# Patient Record
Sex: Male | Born: 1935 | Race: White | Hispanic: No | Smoking: Former smoker
Health system: Southern US, Community
[De-identification: ages and names within clinical notes are randomized; demographics above are authoritative.]

## PROBLEM LIST (undated history)

## (undated) DIAGNOSIS — D649 Anemia, unspecified: Secondary | ICD-10-CM

## (undated) DIAGNOSIS — C61 Malignant neoplasm of prostate: Secondary | ICD-10-CM

## (undated) DIAGNOSIS — Z85828 Personal history of other malignant neoplasm of skin: Secondary | ICD-10-CM

## (undated) DIAGNOSIS — I10 Essential (primary) hypertension: Secondary | ICD-10-CM

## (undated) DIAGNOSIS — I4891 Unspecified atrial fibrillation: Secondary | ICD-10-CM

## (undated) DIAGNOSIS — C801 Malignant (primary) neoplasm, unspecified: Secondary | ICD-10-CM

## (undated) DIAGNOSIS — M1990 Unspecified osteoarthritis, unspecified site: Secondary | ICD-10-CM

## (undated) HISTORY — DX: Unspecified atrial fibrillation: I48.91

## (undated) HISTORY — PX: PROSTATE SURGERY: SHX751

## (undated) HISTORY — DX: Malignant neoplasm of prostate: C61

## (undated) HISTORY — PX: HERNIA REPAIR: SHX51

## (undated) HISTORY — PX: INNER EAR SURGERY: SHX679

## (undated) HISTORY — DX: Essential (primary) hypertension: I10

## (undated) HISTORY — PX: HAND SURGERY: SHX662

## (undated) HISTORY — DX: Hemochromatosis, unspecified: E83.119

## (undated) HISTORY — DX: Anemia, unspecified: D64.9

## (undated) HISTORY — PX: APPENDECTOMY: SHX54

## (undated) HISTORY — PX: SURGERY SCROTAL / TESTICULAR: SUR1316

## (undated) HISTORY — PX: CHOLECYSTECTOMY: SHX55

## (undated) HISTORY — DX: Malignant (primary) neoplasm, unspecified: C80.1

---
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
## 2004-03-29 ENCOUNTER — Ambulatory Visit: Payer: Self-pay | Admitting: Ophthalmology

## 2004-04-03 ENCOUNTER — Ambulatory Visit: Payer: Self-pay | Admitting: Ophthalmology

## 2006-01-29 ENCOUNTER — Emergency Department: Payer: Self-pay | Admitting: Emergency Medicine

## 2006-02-03 ENCOUNTER — Emergency Department: Payer: Self-pay | Admitting: Unknown Physician Specialty

## 2006-03-18 DIAGNOSIS — Z85828 Personal history of other malignant neoplasm of skin: Secondary | ICD-10-CM

## 2006-03-18 HISTORY — DX: Personal history of other malignant neoplasm of skin: Z85.828

## 2011-02-24 ENCOUNTER — Inpatient Hospital Stay: Payer: Self-pay | Admitting: Internal Medicine

## 2011-10-10 ENCOUNTER — Ambulatory Visit: Payer: Self-pay | Admitting: Internal Medicine

## 2011-10-10 LAB — CBC CANCER CENTER
Basophil #: 0.1 x10 3/mm (ref 0.0–0.1)
Eosinophil #: 0.2 x10 3/mm (ref 0.0–0.7)
HCT: 32.7 % — ABNORMAL LOW (ref 40.0–52.0)
Lymphocyte #: 2.4 x10 3/mm (ref 1.0–3.6)
Lymphocyte %: 27 %
MCH: 35.3 pg — ABNORMAL HIGH (ref 26.0–34.0)
MCHC: 35.5 g/dL (ref 32.0–36.0)
MCV: 100 fL (ref 80–100)
Neutrophil #: 5.3 x10 3/mm (ref 1.4–6.5)
Platelet: 389 x10 3/mm (ref 150–440)
RDW: 17.2 % — ABNORMAL HIGH (ref 11.5–14.5)
WBC: 8.9 x10 3/mm (ref 3.8–10.6)

## 2011-10-10 LAB — IRON AND TIBC
Iron Bind.Cap.(Total): 244 ug/dL — ABNORMAL LOW (ref 250–450)
Iron Saturation: 61 %
Iron: 148 ug/dL (ref 65–175)
Unbound Iron-Bind.Cap.: 96 ug/dL

## 2011-10-10 LAB — CREATININE, SERUM
Creatinine: 1.12 mg/dL (ref 0.60–1.30)
EGFR (African American): >60

## 2011-10-10 LAB — FERRITIN: Ferritin (ARMC): 1159 ng/mL — ABNORMAL HIGH (ref 8–388)

## 2011-10-10 LAB — RETICULOCYTES
Absolute Retic Count: 0.0284 10*6/uL — ABNORMAL LOW (ref 0.031–0.129)
Reticulocyte: 0.86 % (ref 0.7–2.5)

## 2011-10-14 LAB — PROT IMMUNOELECTROPHORES(ARMC)

## 2011-10-17 ENCOUNTER — Ambulatory Visit: Payer: Self-pay | Admitting: Internal Medicine

## 2011-10-29 LAB — RETICULOCYTES
Absolute Retic Count: 0.0333 10*6/uL (ref 0.031–0.129)
Reticulocyte: 1.12 % (ref 0.7–2.5)

## 2011-10-29 LAB — IRON AND TIBC
Iron Bind.Cap.(Total): 246 ug/dL — ABNORMAL LOW (ref 250–450)
Unbound Iron-Bind.Cap.: 140 ug/dL

## 2011-10-29 LAB — CANCER CENTER HEMOGLOBIN: HGB: 10.1 g/dL — ABNORMAL LOW (ref 13.0–18.0)

## 2011-11-01 LAB — OCCULT BLOOD X 1 CARD TO LAB, STOOL
Occult Blood, Feces: NEGATIVE
Occult Blood, Feces: NEGATIVE

## 2011-11-17 ENCOUNTER — Ambulatory Visit: Payer: Self-pay | Admitting: Internal Medicine

## 2011-12-04 LAB — CANCER CENTER HEMOGLOBIN: HGB: 10.3 g/dL — ABNORMAL LOW (ref 13.0–18.0)

## 2011-12-17 ENCOUNTER — Ambulatory Visit: Payer: Self-pay | Admitting: Internal Medicine

## 2012-01-17 ENCOUNTER — Ambulatory Visit: Payer: Self-pay | Admitting: Internal Medicine

## 2012-02-16 ENCOUNTER — Ambulatory Visit: Payer: Self-pay | Admitting: Internal Medicine

## 2012-02-26 LAB — FERRITIN: Ferritin (ARMC): 1187 ng/mL — ABNORMAL HIGH (ref 8–388)

## 2012-02-26 LAB — CBC CANCER CENTER
Basophil #: 0.2 x10 3/mm — ABNORMAL HIGH (ref 0.0–0.1)
Basophil %: 2.1 %
Eosinophil #: 0.3 x10 3/mm (ref 0.0–0.7)
Eosinophil %: 4.3 %
HCT: 32.3 % — ABNORMAL LOW (ref 40.0–52.0)
HGB: 11.4 g/dL — ABNORMAL LOW (ref 13.0–18.0)
Lymphocyte #: 2.4 x10 3/mm (ref 1.0–3.6)
Lymphocyte %: 30.5 %
MCH: 34.3 pg — ABNORMAL HIGH (ref 26.0–34.0)
MCHC: 35.3 g/dL (ref 32.0–36.0)
MCV: 97 fL (ref 80–100)
Monocyte #: 1.3 x10 3/mm — ABNORMAL HIGH (ref 0.2–1.0)
Monocyte %: 16.4 %
Neutrophil #: 3.7 x10 3/mm (ref 1.4–6.5)
Neutrophil %: 46.7 %
Platelet: 371 x10 3/mm (ref 150–440)
RBC: 3.33 10*6/uL — ABNORMAL LOW (ref 4.40–5.90)
RDW: 18 % — ABNORMAL HIGH (ref 11.5–14.5)
WBC: 7.9 x10 3/mm (ref 3.8–10.6)

## 2012-03-18 ENCOUNTER — Ambulatory Visit: Payer: Self-pay | Admitting: Internal Medicine

## 2012-04-18 ENCOUNTER — Ambulatory Visit: Payer: Self-pay | Admitting: Internal Medicine

## 2012-04-22 LAB — RETICULOCYTES
Absolute Retic Count: 0.027 10*6/uL — ABNORMAL LOW (ref 0.031–0.129)
Reticulocyte: 0.8 % (ref 0.7–2.5)

## 2012-04-22 LAB — HEPATIC FUNCTION PANEL A (ARMC)
Albumin: 4.3 g/dL (ref 3.4–5.0)
SGOT(AST): 25 U/L (ref 15–37)
Total Protein: 7 g/dL (ref 6.4–8.2)

## 2012-04-22 LAB — CREATININE, SERUM: EGFR (Non-African Amer.): >60

## 2012-05-01 LAB — CBC CANCER CENTER
Basophil #: 0.1 x10 3/mm (ref 0.0–0.1)
Basophil %: 1.8 %
Eosinophil #: 0.2 x10 3/mm (ref 0.0–0.7)
Eosinophil %: 3.3 %
HCT: 32.7 % — ABNORMAL LOW (ref 40.0–52.0)
Lymphocyte #: 1.9 x10 3/mm (ref 1.0–3.6)
MCHC: 35.1 g/dL (ref 32.0–36.0)
MCV: 99 fL (ref 80–100)
Monocyte #: 1 x10 3/mm (ref 0.2–1.0)
Monocyte %: 13 %
Neutrophil #: 4.1 x10 3/mm (ref 1.4–6.5)
RDW: 18.5 % — ABNORMAL HIGH (ref 11.5–14.5)
WBC: 7.3 x10 3/mm (ref 3.8–10.6)

## 2012-05-01 LAB — BILIRUBIN, DIRECT: Bilirubin, Direct: 0.3 mg/dL — ABNORMAL HIGH (ref 0.00–0.20)

## 2012-05-16 ENCOUNTER — Ambulatory Visit: Payer: Self-pay | Admitting: Internal Medicine

## 2012-06-09 LAB — CBC CANCER CENTER
Basophil #: 0.1 x10 3/mm (ref 0.0–0.1)
Basophil %: 1.5 %
Eosinophil #: 0.2 x10 3/mm (ref 0.0–0.7)
HCT: 30.6 % — ABNORMAL LOW (ref 40.0–52.0)
HGB: 10.7 g/dL — ABNORMAL LOW (ref 13.0–18.0)
Lymphocyte %: 32.4 %
MCH: 34.7 pg — ABNORMAL HIGH (ref 26.0–34.0)
MCHC: 34.9 g/dL (ref 32.0–36.0)
MCV: 100 fL (ref 80–100)
Monocyte %: 11.4 %
Neutrophil #: 3.4 x10 3/mm (ref 1.4–6.5)
Platelet: 305 x10 3/mm (ref 150–440)
RBC: 3.08 10*6/uL — ABNORMAL LOW (ref 4.40–5.90)
RDW: 17.8 % — ABNORMAL HIGH (ref 11.5–14.5)

## 2012-06-09 LAB — FERRITIN: Ferritin (ARMC): 1181 ng/mL — ABNORMAL HIGH (ref 8–388)

## 2012-06-16 ENCOUNTER — Ambulatory Visit: Payer: Self-pay | Admitting: Internal Medicine

## 2012-07-16 ENCOUNTER — Ambulatory Visit: Payer: Self-pay | Admitting: Internal Medicine

## 2012-08-07 LAB — CBC CANCER CENTER
Basophil #: 0 x10 3/mm (ref 0.0–0.1)
Eosinophil #: 0.3 x10 3/mm (ref 0.0–0.7)
Lymphocyte #: 2 x10 3/mm (ref 1.0–3.6)
Lymphocyte %: 26.8 %
MCH: 35.4 pg — ABNORMAL HIGH (ref 26.0–34.0)
MCV: 101 fL — ABNORMAL HIGH (ref 80–100)
Monocyte %: 9.6 %
Neutrophil %: 59.7 %
Platelet: 335 x10 3/mm (ref 150–440)
RBC: 3.08 10*6/uL — ABNORMAL LOW (ref 4.40–5.90)
WBC: 7.3 x10 3/mm (ref 3.8–10.6)

## 2012-08-16 ENCOUNTER — Ambulatory Visit: Payer: Self-pay | Admitting: Internal Medicine

## 2012-10-02 ENCOUNTER — Ambulatory Visit: Payer: Self-pay | Admitting: Internal Medicine

## 2012-10-02 LAB — CBC CANCER CENTER
Basophil #: 0.2 x10 3/mm — ABNORMAL HIGH (ref 0.0–0.1)
Eosinophil #: 0.4 x10 3/mm (ref 0.0–0.7)
Eosinophil %: 4.6 %
HCT: 30.3 % — ABNORMAL LOW (ref 40.0–52.0)
HGB: 10.7 g/dL — ABNORMAL LOW (ref 13.0–18.0)
Lymphocyte #: 2.3 x10 3/mm (ref 1.0–3.6)
Lymphocyte %: 29.6 %
Monocyte #: 0.9 x10 3/mm (ref 0.2–1.0)
Monocyte %: 12.1 %
Neutrophil %: 51.7 %
Platelet: 367 x10 3/mm (ref 150–440)
WBC: 7.7 x10 3/mm (ref 3.8–10.6)

## 2012-10-02 LAB — FERRITIN: Ferritin (ARMC): 1245 ng/mL — ABNORMAL HIGH (ref 8–388)

## 2012-10-16 ENCOUNTER — Ambulatory Visit: Payer: Self-pay | Admitting: Internal Medicine

## 2012-11-13 LAB — FERRITIN: Ferritin (ARMC): 1276 ng/mL — ABNORMAL HIGH (ref 8–388)

## 2012-11-13 LAB — CANCER CENTER HEMOGLOBIN: HGB: 11 g/dL — ABNORMAL LOW (ref 13.0–18.0)

## 2012-11-16 ENCOUNTER — Ambulatory Visit: Payer: Self-pay | Admitting: Internal Medicine

## 2012-11-18 LAB — HEMOGLOBIN: HGB: 10.8 g/dL — ABNORMAL LOW (ref 13.0–18.0)

## 2012-11-25 LAB — HEMOGLOBIN: HGB: 11.2 g/dL — ABNORMAL LOW (ref 13.0–18.0)

## 2012-12-16 ENCOUNTER — Ambulatory Visit: Payer: Self-pay | Admitting: Internal Medicine

## 2012-12-21 LAB — CBC CANCER CENTER
Basophil #: 0.1 x10 3/mm (ref 0.0–0.1)
Basophil %: 0.7 %
Lymphocyte #: 2.4 x10 3/mm (ref 1.0–3.6)
Lymphocyte %: 31.4 %
Monocyte #: 0.8 x10 3/mm (ref 0.2–1.0)
Monocyte %: 10.3 %
Neutrophil #: 4.3 x10 3/mm (ref 1.4–6.5)
RDW: 18.9 % — ABNORMAL HIGH (ref 11.5–14.5)

## 2013-01-06 LAB — CANCER CENTER HEMOGLOBIN: HGB: 10.1 g/dL — ABNORMAL LOW (ref 13.0–18.0)

## 2013-01-13 LAB — CANCER CENTER HEMOGLOBIN: HGB: 10.4 g/dL — ABNORMAL LOW (ref 13.0–18.0)

## 2013-01-16 ENCOUNTER — Ambulatory Visit: Payer: Self-pay | Admitting: Internal Medicine

## 2013-02-03 LAB — CANCER CENTER HEMOGLOBIN: HGB: 10.3 g/dL — ABNORMAL LOW (ref 13.0–18.0)

## 2013-02-15 ENCOUNTER — Ambulatory Visit: Payer: Self-pay | Admitting: Internal Medicine

## 2013-03-03 LAB — CANCER CENTER HEMOGLOBIN: HGB: 10 g/dL — ABNORMAL LOW

## 2013-03-15 LAB — CANCER CENTER HEMOGLOBIN: HGB: 9.7 g/dL — ABNORMAL LOW (ref 13.0–18.0)

## 2013-03-18 ENCOUNTER — Ambulatory Visit: Payer: Self-pay | Admitting: Internal Medicine

## 2013-03-31 LAB — CANCER CENTER HEMOGLOBIN: HGB: 10 g/dL — ABNORMAL LOW (ref 13.0–18.0)

## 2013-04-07 LAB — CANCER CENTER HEMOGLOBIN: HGB: 9.8 g/dL — AB (ref 13.0–18.0)

## 2013-04-14 LAB — HEMOGLOBIN: HGB: 9.5 g/dL — ABNORMAL LOW (ref 13.0–18.0)

## 2013-04-18 ENCOUNTER — Ambulatory Visit: Payer: Self-pay | Admitting: Internal Medicine

## 2013-04-21 ENCOUNTER — Ambulatory Visit: Payer: Self-pay | Admitting: Internal Medicine

## 2013-04-21 LAB — CANCER CENTER HEMOGLOBIN: HGB: 9.4 g/dL — ABNORMAL LOW (ref 13.0–18.0)

## 2013-04-28 LAB — CANCER CENTER HEMOGLOBIN: HGB: 9.2 g/dL — ABNORMAL LOW (ref 13.0–18.0)

## 2013-05-05 ENCOUNTER — Inpatient Hospital Stay: Payer: Self-pay | Admitting: Internal Medicine

## 2013-05-05 LAB — TSH: Thyroid Stimulating Horm: 1.86 u[IU]/mL

## 2013-05-05 LAB — CBC WITH DIFFERENTIAL/PLATELET
Basophil #: 0.2 10*3/uL — ABNORMAL HIGH (ref 0.0–0.1)
Basophil %: 1.3 %
Eosinophil #: 0.3 10*3/uL (ref 0.0–0.7)
Eosinophil %: 2.5 %
HCT: 31.7 % — ABNORMAL LOW (ref 40.0–52.0)
HGB: 10.4 g/dL — AB (ref 13.0–18.0)
LYMPHS ABS: 2.1 10*3/uL (ref 1.0–3.6)
Lymphocyte %: 15.5 %
MCH: 34.5 pg — AB (ref 26.0–34.0)
MCHC: 32.9 g/dL (ref 32.0–36.0)
MCV: 105 fL — AB (ref 80–100)
MONOS PCT: 7.6 %
Monocyte #: 1 x10 3/mm (ref 0.2–1.0)
NEUTROS ABS: 9.9 10*3/uL — AB (ref 1.4–6.5)
Neutrophil %: 73.1 %
Platelet: 416 10*3/uL (ref 150–440)
RBC: 3.03 10*6/uL — AB (ref 4.40–5.90)
RDW: 19 % — AB (ref 11.5–14.5)
WBC: 13.6 10*3/uL — ABNORMAL HIGH (ref 3.8–10.6)

## 2013-05-05 LAB — COMPREHENSIVE METABOLIC PANEL WITH GFR
Albumin: 4 g/dL
Alkaline Phosphatase: 70 U/L
Anion Gap: 7
BUN: 34 mg/dL — ABNORMAL HIGH
Bilirubin,Total: 1.5 mg/dL — ABNORMAL HIGH
Calcium, Total: 9.3 mg/dL
Chloride: 108 mmol/L — ABNORMAL HIGH
Co2: 25 mmol/L
Creatinine: 1.28 mg/dL
EGFR (African American): 57 — ABNORMAL LOW
EGFR (Non-African Amer.): 49 — ABNORMAL LOW
Glucose: 98 mg/dL
Osmolality: 287
Potassium: 3.4 mmol/L — ABNORMAL LOW
SGOT(AST): 28 U/L
SGPT (ALT): 21 U/L
Sodium: 140 mmol/L
Total Protein: 7.2 g/dL

## 2013-05-05 LAB — APTT: Activated PTT: 31.5 secs (ref 23.6–35.9)

## 2013-05-05 LAB — TROPONIN I: Troponin-I: <0.02 ng/mL

## 2013-05-05 LAB — URINALYSIS, COMPLETE
Bacteria: NONE SEEN
Bilirubin,UR: NEGATIVE
Blood: NEGATIVE
GLUCOSE, UR: NEGATIVE mg/dL (ref 0–75)
Hyaline Cast: 1
KETONE: NEGATIVE
Leukocyte Esterase: NEGATIVE
NITRITE: NEGATIVE
Ph: 5 (ref 4.5–8.0)
Protein: NEGATIVE
SPECIFIC GRAVITY: 1.014 (ref 1.003–1.030)
WBC UR: 1 /HPF (ref 0–5)

## 2013-05-05 LAB — PROTIME-INR
INR: 1.1
Prothrombin Time: 14.1 secs (ref 11.5–14.7)

## 2013-05-05 LAB — HEMOGLOBIN: HGB: 9.2 g/dL — ABNORMAL LOW (ref 13.0–18.0)

## 2013-05-05 LAB — CANCER CENTER HEMOGLOBIN: HGB: 10 g/dL — ABNORMAL LOW (ref 13.0–18.0)

## 2013-05-05 LAB — MAGNESIUM: Magnesium: 1.7 mg/dL — ABNORMAL LOW

## 2013-05-05 LAB — CK-MB
CK-MB: 1.3 ng/mL (ref 0.5–3.6)
CK-MB: 1.1 ng/mL (ref 0.5–3.6)

## 2013-05-05 LAB — LIPASE, BLOOD: Lipase: 230 U/L

## 2013-05-06 LAB — CBC WITH DIFFERENTIAL/PLATELET
BASOS ABS: 0.1 10*3/uL (ref 0.0–0.1)
Basophil %: 1.7 %
Eosinophil #: 0.3 10*3/uL (ref 0.0–0.7)
Eosinophil %: 4.6 %
HCT: 24.3 % — AB (ref 40.0–52.0)
HGB: 8.8 g/dL — ABNORMAL LOW (ref 13.0–18.0)
LYMPHS ABS: 1.8 10*3/uL (ref 1.0–3.6)
Lymphocyte %: 28.8 %
MCH: 37 pg — ABNORMAL HIGH (ref 26.0–34.0)
MCHC: 36.2 g/dL — AB (ref 32.0–36.0)
MCV: 102 fL — AB (ref 80–100)
Monocyte #: 0.7 x10 3/mm (ref 0.2–1.0)
Monocyte %: 11.1 %
Neutrophil #: 3.3 10*3/uL (ref 1.4–6.5)
Neutrophil %: 53.8 %
Platelet: 312 10*3/uL (ref 150–440)
RBC: 2.37 10*6/uL — ABNORMAL LOW (ref 4.40–5.90)
RDW: 18.1 % — ABNORMAL HIGH (ref 11.5–14.5)
WBC: 6.1 10*3/uL (ref 3.8–10.6)

## 2013-05-06 LAB — COMPREHENSIVE METABOLIC PANEL
ALBUMIN: 3.5 g/dL (ref 3.4–5.0)
Alkaline Phosphatase: 60 U/L
Anion Gap: 6 — ABNORMAL LOW (ref 7–16)
BILIRUBIN TOTAL: 1.4 mg/dL — AB (ref 0.2–1.0)
BUN: 32 mg/dL — ABNORMAL HIGH (ref 7–18)
CO2: 24 mmol/L (ref 21–32)
CREATININE: 1.3 mg/dL (ref 0.60–1.30)
Calcium, Total: 9.1 mg/dL (ref 8.5–10.1)
Chloride: 112 mmol/L — ABNORMAL HIGH (ref 98–107)
EGFR (African American): 56 — ABNORMAL LOW
GFR CALC NON AF AMER: 48 — AB
Glucose: 95 mg/dL (ref 65–99)
OSMOLALITY: 290 (ref 275–301)
Potassium: 3.7 mmol/L (ref 3.5–5.1)
SGOT(AST): 22 U/L (ref 15–37)
SGPT (ALT): 18 U/L (ref 12–78)
Sodium: 142 mmol/L (ref 136–145)
TOTAL PROTEIN: 6.1 g/dL — AB (ref 6.4–8.2)

## 2013-05-06 LAB — TROPONIN I: Troponin-I: <0.02 ng/mL

## 2013-05-06 LAB — CK-MB: CK-MB: 1.3 ng/mL (ref 0.5–3.6)

## 2013-05-16 ENCOUNTER — Ambulatory Visit: Payer: Self-pay | Admitting: Internal Medicine

## 2013-05-19 LAB — CANCER CENTER HEMOGLOBIN: HGB: 9.4 g/dL — AB (ref 13.0–18.0)

## 2013-05-26 LAB — CANCER CENTER HEMOGLOBIN: HGB: 10 g/dL — ABNORMAL LOW (ref 13.0–18.0)

## 2013-05-28 LAB — HEMOGLOBIN: HGB: 9.7 g/dL — ABNORMAL LOW (ref 13.0–18.0)

## 2013-06-04 LAB — CANCER CENTER HEMOGLOBIN: HGB: 10.5 g/dL — ABNORMAL LOW (ref 13.0–18.0)

## 2013-06-11 LAB — CANCER CENTER HEMOGLOBIN: HGB: 10.5 g/dL — ABNORMAL LOW (ref 13.0–18.0)

## 2013-06-16 ENCOUNTER — Ambulatory Visit: Payer: Self-pay | Admitting: Internal Medicine

## 2013-06-18 ENCOUNTER — Ambulatory Visit: Payer: Self-pay | Admitting: Internal Medicine

## 2013-06-18 LAB — CANCER CENTER HEMOGLOBIN: HGB: 10.2 g/dL — ABNORMAL LOW (ref 13.0–18.0)

## 2013-06-23 DIAGNOSIS — M1990 Unspecified osteoarthritis, unspecified site: Secondary | ICD-10-CM | POA: Insufficient documentation

## 2013-06-23 DIAGNOSIS — D649 Anemia, unspecified: Secondary | ICD-10-CM | POA: Insufficient documentation

## 2013-06-23 DIAGNOSIS — I10 Essential (primary) hypertension: Secondary | ICD-10-CM | POA: Insufficient documentation

## 2013-06-24 DIAGNOSIS — I340 Nonrheumatic mitral (valve) insufficiency: Secondary | ICD-10-CM | POA: Insufficient documentation

## 2013-06-24 DIAGNOSIS — I4891 Unspecified atrial fibrillation: Secondary | ICD-10-CM | POA: Insufficient documentation

## 2013-06-25 LAB — CANCER CENTER HEMOGLOBIN: HGB: 10.7 g/dL — AB (ref 13.0–18.0)

## 2013-06-28 ENCOUNTER — Encounter: Payer: Self-pay | Admitting: Neurology

## 2013-07-02 LAB — CANCER CENTER HEMOGLOBIN: HGB: 10.4 g/dL — AB (ref 13.0–18.0)

## 2013-07-12 LAB — CANCER CENTER HEMOGLOBIN: HGB: 10.7 g/dL — ABNORMAL LOW (ref 13.0–18.0)

## 2013-07-16 ENCOUNTER — Ambulatory Visit: Payer: Self-pay | Admitting: Internal Medicine

## 2013-07-16 ENCOUNTER — Encounter: Payer: Self-pay | Admitting: Neurology

## 2013-07-19 LAB — CANCER CENTER HEMOGLOBIN: HGB: 10.9 g/dL — AB (ref 13.0–18.0)

## 2013-07-28 LAB — CANCER CENTER HEMOGLOBIN: HGB: 10.1 g/dL — ABNORMAL LOW (ref 13.0–18.0)

## 2013-08-11 LAB — CANCER CENTER HEMOGLOBIN: HGB: 10.9 g/dL — ABNORMAL LOW (ref 13.0–18.0)

## 2013-08-16 ENCOUNTER — Ambulatory Visit: Payer: Self-pay | Admitting: Internal Medicine

## 2013-08-18 LAB — CANCER CENTER HEMOGLOBIN: HGB: 11 g/dL — AB (ref 13.0–18.0)

## 2013-09-01 LAB — CANCER CENTER HEMOGLOBIN: HGB: 10.8 g/dL — AB (ref 13.0–18.0)

## 2013-09-14 DIAGNOSIS — R270 Ataxia, unspecified: Secondary | ICD-10-CM | POA: Insufficient documentation

## 2013-09-14 DIAGNOSIS — R262 Difficulty in walking, not elsewhere classified: Secondary | ICD-10-CM | POA: Insufficient documentation

## 2013-09-14 DIAGNOSIS — R42 Dizziness and giddiness: Secondary | ICD-10-CM | POA: Insufficient documentation

## 2013-09-15 ENCOUNTER — Ambulatory Visit: Payer: Self-pay | Admitting: Internal Medicine

## 2013-09-15 LAB — CANCER CENTER HEMOGLOBIN: HGB: 10.2 g/dL — ABNORMAL LOW (ref 13.0–18.0)

## 2013-09-29 LAB — CANCER CENTER HEMOGLOBIN: HGB: 9.9 g/dL — AB (ref 13.0–18.0)

## 2013-10-13 LAB — CANCER CENTER HEMOGLOBIN: HGB: 10.4 g/dL — AB (ref 13.0–18.0)

## 2013-10-16 ENCOUNTER — Ambulatory Visit: Payer: Self-pay | Admitting: Internal Medicine

## 2013-10-28 LAB — CANCER CENTER HEMOGLOBIN: HGB: 10.1 g/dL — ABNORMAL LOW (ref 13.0–18.0)

## 2013-11-11 LAB — CANCER CENTER HEMOGLOBIN: HGB: 9.3 g/dL — AB (ref 13.0–18.0)

## 2013-11-16 ENCOUNTER — Ambulatory Visit: Payer: Self-pay | Admitting: Internal Medicine

## 2013-11-25 LAB — CREATININE, SERUM
Creatinine: 1.22 mg/dL (ref 0.60–1.30)
EGFR (Non-African Amer.): 52 — ABNORMAL LOW

## 2013-11-25 LAB — CBC CANCER CENTER
BASOS PCT: 1.6 %
Basophil #: 0.1 x10 3/mm (ref 0.0–0.1)
EOS PCT: 2.3 %
Eosinophil #: 0.2 x10 3/mm (ref 0.0–0.7)
HCT: 31.5 % — AB (ref 40.0–52.0)
HGB: 10.4 g/dL — ABNORMAL LOW (ref 13.0–18.0)
LYMPHS ABS: 2 x10 3/mm (ref 1.0–3.6)
Lymphocyte %: 24.6 %
MCH: 33 pg (ref 26.0–34.0)
MCHC: 33 g/dL (ref 32.0–36.0)
MCV: 100 fL (ref 80–100)
Monocyte #: 0.8 x10 3/mm (ref 0.2–1.0)
Monocyte %: 10.7 %
NEUTROS ABS: 4.8 x10 3/mm (ref 1.4–6.5)
Neutrophil %: 60.8 %
Platelet: 367 x10 3/mm (ref 150–440)
RBC: 3.15 10*6/uL — ABNORMAL LOW (ref 4.40–5.90)
RDW: 22.5 % — ABNORMAL HIGH (ref 11.5–14.5)
WBC: 8 x10 3/mm (ref 3.8–10.6)

## 2013-12-02 LAB — CBC CANCER CENTER
Basophil #: 0.1 x10 3/mm (ref 0.0–0.1)
Basophil %: 1.4 %
Eosinophil #: 0.3 x10 3/mm (ref 0.0–0.7)
Eosinophil %: 3.8 %
HCT: 29.5 % — ABNORMAL LOW (ref 40.0–52.0)
HGB: 9.8 g/dL — AB (ref 13.0–18.0)
LYMPHS ABS: 2.1 x10 3/mm (ref 1.0–3.6)
Lymphocyte %: 28.7 %
MCH: 33.5 pg (ref 26.0–34.0)
MCHC: 33.3 g/dL (ref 32.0–36.0)
MCV: 101 fL — AB (ref 80–100)
MONO ABS: 0.8 x10 3/mm (ref 0.2–1.0)
MONOS PCT: 10.4 %
Neutrophil #: 4.1 x10 3/mm (ref 1.4–6.5)
Neutrophil %: 55.7 %
PLATELETS: 370 x10 3/mm (ref 150–440)
RBC: 2.93 10*6/uL — AB (ref 4.40–5.90)
RDW: 21.9 % — ABNORMAL HIGH (ref 11.5–14.5)
WBC: 7.3 x10 3/mm (ref 3.8–10.6)

## 2013-12-16 ENCOUNTER — Ambulatory Visit: Payer: Self-pay | Admitting: Internal Medicine

## 2013-12-16 LAB — CBC CANCER CENTER
BASOS ABS: 0 x10 3/mm (ref 0.0–0.1)
BASOS PCT: 0.7 %
Eosinophil #: 0.3 x10 3/mm (ref 0.0–0.7)
Eosinophil %: 4.2 %
HCT: 30 % — AB (ref 40.0–52.0)
HGB: 9.9 g/dL — AB (ref 13.0–18.0)
LYMPHS ABS: 1.5 x10 3/mm (ref 1.0–3.6)
LYMPHS PCT: 24.5 %
MCH: 33.5 pg (ref 26.0–34.0)
MCHC: 33.1 g/dL (ref 32.0–36.0)
MCV: 101 fL — ABNORMAL HIGH (ref 80–100)
Monocyte #: 0.6 x10 3/mm (ref 0.2–1.0)
Monocyte %: 9.3 %
NEUTROS ABS: 3.8 x10 3/mm (ref 1.4–6.5)
NEUTROS PCT: 61.3 %
Platelet: 308 x10 3/mm (ref 150–440)
RBC: 2.96 10*6/uL — ABNORMAL LOW (ref 4.40–5.90)
RDW: 22.2 % — ABNORMAL HIGH (ref 11.5–14.5)
WBC: 6.3 x10 3/mm (ref 3.8–10.6)

## 2013-12-27 LAB — CANCER CENTER HEMOGLOBIN: HGB: 10.2 g/dL — AB (ref 13.0–18.0)

## 2014-01-13 LAB — CANCER CENTER HEMOGLOBIN: HGB: 10 g/dL — ABNORMAL LOW (ref 13.0–18.0)

## 2014-01-16 ENCOUNTER — Ambulatory Visit: Payer: Self-pay | Admitting: Internal Medicine

## 2014-01-27 LAB — CANCER CENTER HEMOGLOBIN: HGB: 10.1 g/dL — ABNORMAL LOW (ref 13.0–18.0)

## 2014-02-09 LAB — CANCER CENTER HEMOGLOBIN: HGB: 10.5 g/dL — ABNORMAL LOW (ref 13.0–18.0)

## 2014-02-15 ENCOUNTER — Ambulatory Visit: Payer: Self-pay | Admitting: Internal Medicine

## 2014-02-24 LAB — CANCER CENTER HEMOGLOBIN: HGB: 9.4 g/dL — ABNORMAL LOW (ref 13.0–18.0)

## 2014-03-08 LAB — CANCER CENTER HEMOGLOBIN: HGB: 10.9 g/dL — AB (ref 13.0–18.0)

## 2014-03-18 ENCOUNTER — Ambulatory Visit: Payer: Self-pay | Admitting: Internal Medicine

## 2014-03-24 LAB — CANCER CENTER HEMOGLOBIN: HGB: 9.8 g/dL — ABNORMAL LOW (ref 13.0–18.0)

## 2014-04-07 LAB — CANCER CENTER HEMOGLOBIN: HGB: 9.7 g/dL — ABNORMAL LOW (ref 13.0–18.0)

## 2014-04-18 ENCOUNTER — Ambulatory Visit: Payer: Self-pay | Admitting: Internal Medicine

## 2014-04-21 LAB — CANCER CENTER HEMOGLOBIN: HGB: 9.9 g/dL — ABNORMAL LOW (ref 13.0–18.0)

## 2014-05-17 ENCOUNTER — Ambulatory Visit
Admit: 2014-05-17 | Disposition: A | Discharge status: Home / Self Care | Payer: Self-pay | Attending: Internal Medicine | Admitting: Internal Medicine

## 2014-06-16 LAB — CANCER CENTER HEMOGLOBIN: HGB: 10.5 g/dL — AB (ref 13.0–18.0)

## 2014-06-17 ENCOUNTER — Ambulatory Visit
Admit: 2014-06-17 | Disposition: A | Discharge status: Home / Self Care | Payer: Self-pay | Attending: Internal Medicine | Admitting: Internal Medicine

## 2014-06-30 LAB — CANCER CENTER HEMOGLOBIN: HGB: 9.7 g/dL — ABNORMAL LOW (ref 13.0–18.0)

## 2014-07-09 NOTE — Consult Note (Signed)
PATIENT NAME:  Gerald [name], Gerald [name] MR#:  [id number] DATE OF BIRTH:  1920-04-04  DATE OF CONSULTATION:  05/06/2013  REFERRING PHYSICIAN:  Dr. Kary Kos, Dr. Inez Pilgrim.  CONSULTING PHYSICIAN:  Bertil Brickey D. Apollos Tenbrink, MD  CARDIOLOGIST: Dr. Saralyn Pilar   INDICATION: Atrial fibrillation, palpitations, tachycardia.   HISTORY OF PRESENT ILLNESS: The patient is a 81 year old white male with a history of anemia of chronic disease on Procrit, hemochromatosis, history of multiple other medical problems including paroxysmal Afib, came to the hospital with palpitations. Had seen Dr. Inez Pilgrim as part of his evaluation for anemia. Was found to have elevated heart rate. Went to the Emergency Room on Dr. [name] request and was found to have a heart rate between 40 and 140, blood pressure of 90. The patient states that he was reasonably asymptomatic. He was given calcium and magnesium and IV fluids because his blood pressure was low. Heart rate stabilized in the 70s.  He converted back to sinus rhythm from Afib. He has had some diarrhea over the last few days. Denied any pain. He had some cramping, however, that also improved.   PAST MEDICAL HISTORY: Again, notable for anemia, hypertension, atrial fibrillation, history of pneumonia in the past, hyperlipidemia, arthritis, hemochromatosis,  generalized weakness.   PAST SURGICAL HISTORY:  Cataract surgery, testicular carcinoma, prostate carcinoma status post surgery, hernia, gallbladder.   MEDICATIONS: Lisinopril 20 mg a day, aspirin 81 mg a day.   ALLERGIES: None.   FAMILY HISTORY: Lung cancer, smoker; leukemia, colon cancer, breast cancer.   SOCIAL HISTORY: Married, lives with his wife, has 7 children, 16 grandchildren, 2 Designer, industrial/product. Used to smoke, quit 50 years ago. No alcohol consumption. Used to work as an Chief Financial Officer, originally from [place].   REVIEW OF SYSTEMS:  No blackout spells. No syncope. No nausea or vomiting. No fever. No chills. No sweats. No weight  loss. No weight gain. No hemoptysis or hematemesis. No bright red blood per rectum. He has had palpitations, tachycardia, low blood pressure, weakness.   PHYSICAL EXAMINATION:  VITAL SIGNS:  Initially revealed blood pressure was 90, increased to 419 systolic, 70 diastolic. Pulse initially was 149, now is down to 75 and regular. Respiratory is 18.  HEENT: Normocephalic, atraumatic. Pupils are equal and reactive to light.  NECK:  Supple. No significant JVD, bruits or adenopathy.  LUNGS: Clear to auscultation and percussion. No significant wheeze, rhonchi or rale.  HEART: Regular rate and rhythm.  ABDOMEN: Benign.  EXTREMITIES: Within normal limits.   NEUROLOGIC: Intact.  SKIN: Normal.   EKG: Rate 72. Initial EKG: Atrial fibrillation, rapid ventricular response, nonspecific ST-T changes.   LABORATORY DATA:  BUN 34, potassium 3.4. Magnesium level was low at 1.7. Bilirubin . Troponin less than 0.2. Hemoglobin 10.  Coag was normal.  UA was normal. D-dimer pending.   ASSESSMENT:  Atrial fibrillation, palpitations, hypertension, hypokalemia, anemia, diarrhea.   PLAN:  Agree with admit. Place on telemetry. Recommend fluid resuscitation. Recommend correct electrolytes. No direct therapy for atrial fibrillation. He has converted back to sinus. Do not recommend anticoagulation because the patient has a long history of anemia. Hold off on hypertensive drugs because of his relative hypotension secondary to dehydration and diarrhea. Replace potassium and magnesium. Follow up anemia. Continue current therapy. Treat the patient conservatively. Have the patient follow up with Dr. Saralyn Pilar. Okay to discharge. Treat the patient medically in the interim.   ____________________________ Loran Senters Clayborn Bigness, MD ddc:dmm D: 05/06/2013 15:14:05 ET T: 05/06/2013 19:01:13 ET JOB#: [id number]  cc: Talise Sligh D. Clydene Burack,  MD, <Dictator> Yolonda Kida MD ELECTRONICALLY SIGNED 06/14/2013 7:00

## 2014-07-09 NOTE — Discharge Summary (Signed)
PATIENT NAME:  Gerald, [name] MR#:  [id number] DATE OF BIRTH:  11-25-1920  DATE OF ADMISSION:  05/05/2013 DATE OF DISCHARGE:  05/06/2013  DISCHARGE DIAGNOSES: 1.  Paroxysmal atrial fibrillation.  2.  Dehydration.  3.  Chronic dizziness.  4.  Hemochromatosis with anemia.  DISCHARGE MEDICATIONS: 1.  Aspirin 81 mg daily.  2.  Lisinopril 20 mg 1/2 tablet once a day at bedtime.   ADMITTING HISTORY AND PHYSICAL AND HOSPITAL COURSE:  Please see detailed H and P dictated previously. In brief, a 81 year old male patient with history of hemochromatosis anemia on Procrit shot was seen by Dr. Inez Pilgrim in his office. He was found to have tachycardia in the 140s, brought to the Emergency Room, later admitted to the hospitalist service secondary to afib with rapid ventricular rate. The patient received magnesium potassium. He did convert to normal sinus rhythm. The patient was monitored on tele floor. He did not have any further episodes of atrial fibrillation. No bradycardia. The patient did have episodes of bradycardia in the past for which he is not on any rate control medications. He was seen by Dr. Clayborn Bigness. Echo was done which showed moderate MR, normal wall motion. The patient feels better back to baseline. He does have chronic dizziness for which he is waiting on an outpatient neurology consult, which he will follow up with. The patient worked with physical therapy and is being discharged back home on the same medications. We are decreasing his lisinopril dose.   DISCHARGE INSTRUCTIONS: Low-sodium diet. Activity as tolerated. Follow up with Dr. Inez Pilgrim in urology in 1 to 2 weeks.  TIME SPENT ON DAY OF DISCHARGE IN DISCHARGE ACTIVITY:  40 minutes   ____________________________ Leia Alf. Ovid Witman, MD srs:ce D: 05/07/2013 16:16:20 ET T: 05/07/2013 17:24:02 ET JOB#: [id number]  cc: Alveta Heimlich R. Diangelo Radel, MD, <Dictator> Simonne Come. Inez Pilgrim, MD Isaias Cowman, MD Irven Easterly. Kary Kos, MD Neita Carp  MD ELECTRONICALLY SIGNED 05/20/2013 18:41

## 2014-07-09 NOTE — H&P (Signed)
PATIENT NAME:  Gerald [name], Gerald [name] MR#:  [id number] DATE OF BIRTH:  12-22-1920  DATE OF ADMISSION:  05/05/2013  PRIMARY CARE PHYSICIAN: Dr. Kary Kos.  CARDIOLOGIST: Dr. Saralyn Pilar.  ONCOLOGIST: Dr. Inez Pilgrim.   HOSPITAL COURSE: The patient is a 81 year old Caucasian male with past medical history significant for history of anemia of chronic disease being on Procrit, also history of hemochromatosis, history of multiple other medical problems including paroxysmal atrial fibrillation, who comes to the hospital with complaints of palpitations.   Apparently the patient was seen by Dr. Inez Pilgrim earlier today and was noted to have elevated heart rate. He was sent to the Emergency Room for further evaluation where he was found to be in AFib with variable rate with heart rate fluctuating between 40 to 140. He was also hypotensive with systolic blood pressure in 90s.   He was given calcium 2 g as well as 2 g of magnesium IV as well as IV fluids after which his blood pressure somewhat stabilized and his heart rate improved to 70s, and he also converted into sinus rhythm again.   He was noted to have premature ventricular complexes with sinus rhythm. He denies any chest pains or denies feeling even palpitations.   He has been complaining of diarrhea over the past few days. He did have diarrheal stool on Saturday a few days ago and he had diarrhea earlier today in the morning. He denies any significant abdominal pains, however, told me that he had some cramping in left lower quadrant earlier today.   PAST MEDICAL HISTORY: Significant for history of anemia of chronic disease, history of initiation on Procrit with goal hemoglobin level of below 12 according to Dr. Inez Pilgrim, history of prostate carcinoma, testicular carcinoma status post surgery, hypertension, cataract surgery bilaterally, atrial fibrillation, history of pneumonia, hypertension, hyperlipidemia, arthritis, hemochromatosis as well as generalized weakness.    MEDICATIONS: The patient is on lisinopril at 20 mg p.o. daily and aspirin 81 mg p.o. daily.   PAST SURGICAL HISTORY: Hernia surgery, gallbladder surgery, as well as testicular and prostate carcinoma operations.   ALLERGIES: No known drug allergies.   FAMILY HISTORY: The patient's father died of lung carcinoma. He was a smoker. The patient's mother had leukemia. The patient's brother had colon cancer. Sister had both breast cancer.   SOCIAL HISTORY: The patient is married. He lives with his wife. He has 7 children, 16 grandchildren, and 2 great grandchildren. He used to smoke for 25 years, quit approximately 50 years ago. Denies alcohol abuse and drinks occasionally only socially. He used to work as an Chief Financial Officer.   REVIEW OF SYSTEMS: Positive for bilateral cataracts and also diarrhea over the past few days intermittently, some left lower quadrant abdominal pain with diarrheal stool, balance problems for the past one year. Also left leg discomfort earlier today. Some incontinence due to prostate carcinoma.  CONSTITUTIONAL: Otherwise, denies any fevers, chills, fatigue, weakness, pains, weight loss or gain.  EYES: Denies any blurry vision, double vision or glaucoma.  ENT: Denies any tinnitus, allergies, epistaxis, sinus pain, difficulty swallowing.  RESPIRATORY: Denies cough, wheezes, asthma, COPD. CARDIOVASCULAR:  Denies chest pain, orthopnea, arrhythmias, palpitations. GASTROINTESTINAL: Denies nausea, vomiting, rectal bleeding, or change in bowel habits. GENITOURINARY: Denies dysuria, hematuria, frequency, or incontinence.  ENDOCRINE: Denies polydipsia, nocturia, thyroid problems, heat or cold intolerance or thirst.  HEMATOLOGIC: Denies anemia, easy bruising, bleeding or swollen glands.  SKIN: Denies any acne, rash, lesions or change in moles.  MUSCULOSKELETAL: Denies arthritis, cramps, swelling, or gout.  NEUROLOGIC:  Denies any numbness, epilepsy, tremors.  PSYCHIATRIC: Denies anxiety,  insomnia, or depression.   PHYSICAL EXAMINATION:  VITAL SIGNS: On arrival to the hospital, temperature is 98. Pulse was 148, respiration rate was 20, blood pressure 90/56, saturation was 98% on room air. The patient's heart rate fluctuates between 40s to 140s to 150s.  GENERAL: A well-developed, well-nourished Caucasian male in no significant distress, sitting on the stretcher.  HEENT: Pupils were equal and reactive to light. Extraocular movements intact. No icterus or conjunctivitis. Normal hearing. No pharyngeal edema. Mucosa is moist.   NECK: No masses. Supple, nontender. Thyroid not enlarged. No adenopathy. No JVD or carotid bruits bilaterally. Full range of motion.  LUNGS: Clear to auscultation in all fields. No rales, rhonchi, diminished breath sounds, or wheezing. No labored inspirations, increased effort, dullness to percussion or respiratory distress.  CARDIOVASCULAR: S1, S2 appreciated. Irregular rhythm with intermittent irregularities. PMI not lateralized. Chest is nontender to palpation with 1+ pedal pulses. No lower extremity edema, calf tenderness or cyanosis was noted.  ABDOMEN: Soft, nontender. Bowel sounds are present. No hepatosplenomegaly or masses were noted.  RECTAL: Deferred.  MUSCULOSKELETAL: Able to move all extremities. No cyanosis, degenerative joint disease or kyphosis. Gait not tested.  SKIN: Did not reveal any rashes, lesions, erythema, nodularity or induration. It was warm and dry to palpation.  LYMPHATIC: No adenopathy in the cervical region.  NEUROLOGICAL: Cranial nerves grossly intact. Sensory is intact. No dysarthria or aphasia.  PSYCHIATRIC: Alert and oriented to time, person and place. Cooperative. Memory is good. No significant confusion, agitation or depression noted.   EKG: Initial EKG showed sinus rhythm at 72 beats per minute. There is marked sinus arrhythmia with occasional consecutive premature ventricular complexes. Second one revealed AFib with rapid  ventricular response with a rate of 129 beats per minute. ST-T abnormalities in inferior leads but no acute ST-T changes were noted. The third one revealed sinus rhythm with first degree AV block at 74 beats per minute, normal axis, nonspecific T-wave abnormality.   LABORATORIES: BUN was 34. Potassium 3.4, otherwise BMP was unremarkable. Magnesium level was low at 1.7. Liver enzymes: Total bilirubin was 1.5, otherwise unremarkable study. Troponin was less than 0.02. Hemoglobin level 18th of February 2015, was 10.0. Repeated 9.2. Coagulation panel within normal limits. Urinalysis was unremarkable. D-dimer is pending.   ASSESSMENT AND PLAN:  1.  Atrial fibrillation with reliable read, now back into sinus rhythm. Will get the patient's TSH as well as cardiac enzymes x3. We will get echocardiogram and get cardiology consultation for further recommendations.  2.  Hypertension. We will continue IV fluids for now, likely dehydration due to diarrhea.  3.  Hypokalemia and hypomagnesemia. Again, diarrhea related. Supplement IV as well as p.o. Check magnesium as well as potassium level in the morning.  4.  Anemia. Get guaiac-negative.  5.  Diarrhea. We will get stool cultures including Clostridium difficile. Will continue IV fluids and we will follow culture results.   TIME SPENT: 50 minutes on this patient.   ____________________________ Theodoro Grist, MD rv:np D: 05/05/2013 20:49:01 ET T: 05/05/2013 21:26:36 ET JOB#: [id number]  cc: Theodoro Grist, MD, <Dictator> Irven Easterly. Kary Kos, MD Theodoro Grist MD ELECTRONICALLY SIGNED 05/19/2013 20:58

## 2014-07-10 NOTE — Discharge Summary (Signed)
PATIENT NAME:  Gerald [name], Gerald [name] MR#:  [id number] DATE OF BIRTH:  February 17, 1921  DATE OF ADMISSION:  02/24/2011 DATE OF DISCHARGE:  02/26/2011  DISCHARGE DIAGNOSES:  1. Acute bronchitis.  2. Acute renal failure, resolved. 3. Atrial fibrillation with rapid ventricular response.  4. Hypertension.   HOSPITAL COURSE: This is an 81 year old male with history of hypertension and recently diagnosed with atrial fibrillation who follows with Dr. Saralyn Pilar. He presented with generalized weakness, cough, and some nausea and diarrhea. He was also having some fever. He was initially started on Levaquin. He developed some nausea and vomiting. He was unable to keep anything down. When he came to the emergency room he was found to be in atrial fibrillation with RVR. He was found to be dehydrated. His chest x-ray showed a possible right-sided pneumonia. He was admitted as fever, cough, possible community-acquired pneumonia, nausea, vomiting, atrial fibrillation with RVR, and acute renal failure. When he came in his white count was normal at 10.2. He was in renal failure with a creatinine of 1.65 and a BUN of 32. His LFTs were basically normal. AST was slightly elevated at 40. His troponin was negative. Lipase was normal at 187. A chest x-ray was done in the emergency room which was read as nodular density near the right lung base. He was started on ceftriaxone and Zithromax. He had an abdominal x-ray done which showed no evidence of bowel obstruction or perforation. She was given IV hydration. He was started on Cardizem 60 mg every 6 hours for his atrial fibrillation. His heart rate is well controlled on Cardizem 240 mg daily. His creatinine is 0.98 today. He is better hydrated. His hemoglobin has been stable in the range of 9.6 to 9.9. White count remained stable. His urinalysis was negative for any pyuria. His blood cultures were all negative. A flu swab was negative for influenza A and B.  His EKG when he came in showed  atrial fibrillation at a rate of 117, normal axis, and nonspecific T wave changes. He is still complaining of some weakness, although that has improved. His cough has improved. He got about two days of ceftriaxone and Zithromax here. I am going to discharge him on Levaquin to complete seven days of antibiotics. I am going to stop his hydrochlorothiazide combination. His blood pressure and heart rate are well controlled on Cardizem CD, 240 mg daily, and I am going to continue that. Physical therapy was consulted and they suggested home health physical therapy which we are going to arrange. He has no further fevers and he is saturating well on room air, 99%.   DISCHARGE MEDICATIONS:  Home Medication: Aspirin 81 mg daily.  New Medications: 1. Levaquin 500 mg p.o. once daily for five days. 2. Cardizem CD 240 mg once daily.   NOTE: Do not take hydrochlorothiazide combination.  DIET: I advised a low-sodium diet.   CONDITION AT DISCHARGE: T-max 98, heart rate 83, blood pressure 126/61, and saturating 99% on room air. Chest is clear. Heart sounds are irregular. Abdomen is soft and nontender.   DISCHARGE FOLLOWUP/INSTRUCTIONS: Follow-up with Dr. [name] early next week. Followup with Dr. Saralyn Pilar in 1 to 2 weeks.   I talked to the daughter if he has any worsening shortness of breath or worsening symptoms he can come back to the ER or call Dr. Kary Kos.   TIME SPENT ON DISCHARGE: 50 minutes.  ____________________________ Mena Pauls, MD ag:slb D: 02/26/2011 10:24:12 ET T: 02/26/2011 10:35:57 ET JOB#: [id number]  cc:  Mena Pauls, MD, <Dictator> Irven Easterly. Kary Kos, MD Mena Pauls MD ELECTRONICALLY SIGNED 03/29/2011 11:49

## 2014-07-14 LAB — CANCER CENTER HEMOGLOBIN: HGB: 10.2 g/dL — AB (ref 13.0–18.0)

## 2014-07-27 ENCOUNTER — Other Ambulatory Visit: Payer: Self-pay | Admitting: *Deleted

## 2014-07-27 DIAGNOSIS — D649 Anemia, unspecified: Secondary | ICD-10-CM

## 2014-07-28 ENCOUNTER — Inpatient Hospital Stay: Payer: Medicare Other | Attending: Internal Medicine

## 2014-07-28 ENCOUNTER — Inpatient Hospital Stay: Payer: Medicare Other

## 2014-07-28 DIAGNOSIS — D649 Anemia, unspecified: Secondary | ICD-10-CM

## 2014-07-28 DIAGNOSIS — Z79899 Other long term (current) drug therapy: Secondary | ICD-10-CM | POA: Insufficient documentation

## 2014-07-28 LAB — CBC WITH DIFFERENTIAL/PLATELET
Basophils Absolute: 0.1 10*3/uL (ref 0–0.1)
Basophils Relative: 2 %
Eosinophils Absolute: 0.2 10*3/uL (ref 0–0.7)
Eosinophils Relative: 3 %
HCT: 30.4 % — ABNORMAL LOW (ref 40.0–52.0)
HEMOGLOBIN: 10.3 g/dL — AB (ref 13.0–18.0)
LYMPHS ABS: 1.6 10*3/uL (ref 1.0–3.6)
LYMPHS PCT: 25 %
MCH: 33.8 pg (ref 26.0–34.0)
MCHC: 33.8 g/dL (ref 32.0–36.0)
MCV: 100 fL (ref 80.0–100.0)
MONO ABS: 0.6 10*3/uL (ref 0.2–1.0)
Monocytes Relative: 9 %
NEUTROS ABS: 4 10*3/uL (ref 1.4–6.5)
NEUTROS PCT: 61 %
Platelets: 337 10*3/uL (ref 150–440)
RBC: 3.04 MIL/uL — AB (ref 4.40–5.90)
RDW: 22.2 % — ABNORMAL HIGH (ref 11.5–14.5)
WBC: 6.6 10*3/uL (ref 3.8–10.6)

## 2014-08-10 ENCOUNTER — Other Ambulatory Visit: Payer: Self-pay | Admitting: Family Medicine

## 2014-08-10 DIAGNOSIS — D6489 Other specified anemias: Secondary | ICD-10-CM

## 2014-08-11 ENCOUNTER — Inpatient Hospital Stay: Payer: Medicare Other

## 2014-08-11 VITALS — BP 137/68 | HR 78 | Resp 18

## 2014-08-11 DIAGNOSIS — Z79899 Other long term (current) drug therapy: Secondary | ICD-10-CM | POA: Diagnosis not present

## 2014-08-11 DIAGNOSIS — D649 Anemia, unspecified: Secondary | ICD-10-CM | POA: Diagnosis not present

## 2014-08-11 DIAGNOSIS — D6489 Other specified anemias: Secondary | ICD-10-CM

## 2014-08-11 LAB — HEMOGLOBIN: Hemoglobin: 9.5 g/dL — ABNORMAL LOW (ref 13.0–18.0)

## 2014-08-11 MED ORDER — EPOETIN ALFA 20000 UNIT/ML IJ SOLN
20000.0000 [IU] | Freq: Once | INTRAMUSCULAR | Status: AC
  Administered 2014-08-11: 20000 [IU] via SUBCUTANEOUS
  Filled 2014-08-11: qty 1

## 2014-08-11 MED ORDER — EPOETIN ALFA 10000 UNIT/ML IJ SOLN
4000.0000 [IU] | Freq: Once | INTRAMUSCULAR | Status: AC
  Administered 2014-08-11: 10000 [IU] via SUBCUTANEOUS
  Filled 2014-08-11: qty 2

## 2014-08-11 MED ORDER — EPOETIN ALFA 40000 UNIT/ML IJ SOLN: 24000.0000 [IU] | Freq: Once | INTRAMUSCULAR | Status: DC

## 2014-08-24 ENCOUNTER — Other Ambulatory Visit: Payer: Self-pay

## 2014-08-24 DIAGNOSIS — D649 Anemia, unspecified: Secondary | ICD-10-CM

## 2014-08-25 ENCOUNTER — Other Ambulatory Visit: Payer: Self-pay | Admitting: Hematology and Oncology

## 2014-08-25 ENCOUNTER — Inpatient Hospital Stay: Payer: Medicare Other

## 2014-08-25 ENCOUNTER — Inpatient Hospital Stay: Payer: Medicare Other | Attending: Family Medicine

## 2014-08-25 VITALS — BP 130/70 | HR 60 | Resp 18

## 2014-08-25 DIAGNOSIS — Z79899 Other long term (current) drug therapy: Secondary | ICD-10-CM | POA: Insufficient documentation

## 2014-08-25 DIAGNOSIS — D631 Anemia in chronic kidney disease: Secondary | ICD-10-CM

## 2014-08-25 DIAGNOSIS — N189 Chronic kidney disease, unspecified: Principal | ICD-10-CM

## 2014-08-25 DIAGNOSIS — D649 Anemia, unspecified: Secondary | ICD-10-CM | POA: Insufficient documentation

## 2014-08-25 LAB — FERRITIN: Ferritin: 827 ng/mL — ABNORMAL HIGH (ref 24–336)

## 2014-08-25 LAB — HEMOGLOBIN: Hemoglobin: 9.5 g/dL — ABNORMAL LOW (ref 13.0–18.0)

## 2014-08-25 MED ORDER — EPOETIN ALFA 20000 UNIT/ML IJ SOLN
20000.0000 [IU] | Freq: Once | INTRAMUSCULAR | Status: AC
  Administered 2014-08-25: 20000 [IU] via SUBCUTANEOUS
  Filled 2014-08-25: qty 1

## 2014-09-08 ENCOUNTER — Inpatient Hospital Stay: Payer: Medicare Other

## 2014-09-08 ENCOUNTER — Other Ambulatory Visit: Payer: Self-pay | Admitting: Family Medicine

## 2014-09-08 VITALS — BP 135/70 | HR 55 | Temp 97.3°F | Resp 18

## 2014-09-08 DIAGNOSIS — D649 Anemia, unspecified: Secondary | ICD-10-CM

## 2014-09-08 DIAGNOSIS — N189 Chronic kidney disease, unspecified: Principal | ICD-10-CM

## 2014-09-08 DIAGNOSIS — D631 Anemia in chronic kidney disease: Secondary | ICD-10-CM

## 2014-09-08 LAB — FERRITIN: FERRITIN: 881 ng/mL — AB (ref 24–336)

## 2014-09-08 LAB — HEMOGLOBIN: HEMOGLOBIN: 9.6 g/dL — AB (ref 13.0–18.0)

## 2014-09-08 MED ORDER — EPOETIN ALFA 20000 UNIT/ML IJ SOLN
20000.0000 [IU] | Freq: Once | INTRAMUSCULAR | Status: AC
  Administered 2014-09-08: 20000 [IU] via SUBCUTANEOUS
  Filled 2014-09-08: qty 1

## 2014-09-22 ENCOUNTER — Inpatient Hospital Stay: Payer: Medicare Other

## 2014-09-22 ENCOUNTER — Inpatient Hospital Stay: Payer: Medicare Other | Attending: Family Medicine

## 2014-09-22 VITALS — BP 135/65 | HR 81 | Temp 97.0°F | Resp 18

## 2014-09-22 DIAGNOSIS — D631 Anemia in chronic kidney disease: Secondary | ICD-10-CM

## 2014-09-22 DIAGNOSIS — Z79899 Other long term (current) drug therapy: Secondary | ICD-10-CM | POA: Insufficient documentation

## 2014-09-22 DIAGNOSIS — N189 Chronic kidney disease, unspecified: Principal | ICD-10-CM

## 2014-09-22 LAB — HEMOGLOBIN: Hemoglobin: 9.9 g/dL — ABNORMAL LOW (ref 13.0–18.0)

## 2014-09-22 MED ORDER — EPOETIN ALFA 20000 UNIT/ML IJ SOLN
20000.0000 [IU] | Freq: Once | INTRAMUSCULAR | Status: AC
  Administered 2014-09-22: 20000 [IU] via SUBCUTANEOUS
  Filled 2014-09-22: qty 1

## 2014-10-06 ENCOUNTER — Inpatient Hospital Stay: Payer: Medicare Other

## 2014-10-20 ENCOUNTER — Ambulatory Visit: Payer: Self-pay

## 2014-10-20 ENCOUNTER — Ambulatory Visit: Payer: Self-pay | Admitting: Family Medicine

## 2014-10-20 ENCOUNTER — Other Ambulatory Visit: Payer: Self-pay

## 2014-10-21 ENCOUNTER — Encounter: Payer: Self-pay | Admitting: Family Medicine

## 2014-10-21 ENCOUNTER — Inpatient Hospital Stay: Payer: Medicare Other | Attending: Family Medicine

## 2014-10-21 ENCOUNTER — Inpatient Hospital Stay: Payer: Medicare Other

## 2014-10-21 ENCOUNTER — Inpatient Hospital Stay (HOSPITAL_BASED_OUTPATIENT_CLINIC_OR_DEPARTMENT_OTHER): Payer: Medicare Other | Admitting: Family Medicine

## 2014-10-21 VITALS — BP 121/61 | HR 89 | Temp 95.5°F | Resp 18 | Ht 69.0 in | Wt 150.6 lb

## 2014-10-21 DIAGNOSIS — Z8546 Personal history of malignant neoplasm of prostate: Secondary | ICD-10-CM | POA: Insufficient documentation

## 2014-10-21 DIAGNOSIS — D508 Other iron deficiency anemias: Secondary | ICD-10-CM

## 2014-10-21 DIAGNOSIS — Z79899 Other long term (current) drug therapy: Secondary | ICD-10-CM | POA: Insufficient documentation

## 2014-10-21 DIAGNOSIS — I10 Essential (primary) hypertension: Secondary | ICD-10-CM | POA: Insufficient documentation

## 2014-10-21 DIAGNOSIS — N189 Chronic kidney disease, unspecified: Secondary | ICD-10-CM

## 2014-10-21 DIAGNOSIS — Z8547 Personal history of malignant neoplasm of testis: Secondary | ICD-10-CM

## 2014-10-21 DIAGNOSIS — C61 Malignant neoplasm of prostate: Secondary | ICD-10-CM | POA: Insufficient documentation

## 2014-10-21 DIAGNOSIS — Z7982 Long term (current) use of aspirin: Secondary | ICD-10-CM | POA: Diagnosis not present

## 2014-10-21 DIAGNOSIS — D631 Anemia in chronic kidney disease: Secondary | ICD-10-CM | POA: Insufficient documentation

## 2014-10-21 DIAGNOSIS — C6290 Malignant neoplasm of unspecified testis, unspecified whether descended or undescended: Secondary | ICD-10-CM | POA: Insufficient documentation

## 2014-10-21 DIAGNOSIS — Z87891 Personal history of nicotine dependence: Secondary | ICD-10-CM | POA: Diagnosis not present

## 2014-10-21 LAB — HEMOGLOBIN: Hemoglobin: 10.5 g/dL — ABNORMAL LOW (ref 13.0–18.0)

## 2014-10-21 NOTE — Progress Notes (Signed)
[name]  [phone]  [phone]     Gerald [name] DOB: [id number]  MR#: [id number]  JKK#:[id number]  Patient Care Team: [name], MD as PCP - General (Family Medicine)  CHIEF COMPLAINT:  Chief Complaint  Patient presents with  . Follow-up    Anemia    Patient with history of anemia related to chronic renal disease, currently on Procrit with target hemoglobin above 10 g. Also with hemachromatosis. Patient's ferritin level MC, iron saturation normal without use of oral iron, single gene mutation previously noted.  INTERVAL HISTORY:  Patient is here today for further follow-up and treatment consideration regarding anemia related to chronic renal disease as well as hemachromatosis. He reports feeling very well and denies any complaints today.He has been evaluated every 2 weeks for long-term therapy with the use of Procrit. Today his hemoglobin is 10.5.   REVIEW OF SYSTEMS:   Review of Systems  Constitutional: Negative for fever, chills, weight loss, malaise/fatigue and diaphoresis.  HENT: Negative for congestion, ear discharge, ear pain, hearing loss, nosebleeds, sore throat and tinnitus.   Eyes: Negative for blurred vision, double vision, photophobia, pain, discharge and redness.  Respiratory: Negative for cough, hemoptysis, sputum production, shortness of breath, wheezing and stridor.   Cardiovascular: Negative for chest pain, palpitations, orthopnea, claudication, leg swelling and PND.  Gastrointestinal: Negative for heartburn, nausea, vomiting, abdominal pain, diarrhea, constipation, blood in stool and melena.  Genitourinary: Negative.   Musculoskeletal: Negative.   Skin: Negative.   Neurological: Negative for dizziness, tingling, focal weakness, seizures, weakness and headaches.  Endo/Heme/Allergies: Does not bruise/bleed easily.  Psychiatric/Behavioral: Negative for depression. The patient is not nervous/anxious and does not have  insomnia.     As per HPI. Otherwise, a complete review of systems is negatve.   PAST MEDICAL HISTORY: Past Medical History  Diagnosis Date  . Hypertension   . Anemia   . Prostate cancer   . Cancer     Testicular    PAST SURGICAL HISTORY: Past Surgical History  Procedure Laterality Date  . Hand surgery Bilateral   . Prostate surgery    . Appendectomy    . Cholecystectomy    . Inner ear surgery    . Surgery scrotal / testicular Right     FAMILY HISTORY Family History  Problem Relation Age of Onset  . Hypertension Mother     GYNECOLOGIC HISTORY:  No LMP for male patient.     ADVANCED DIRECTIVES:    HEALTH MAINTENANCE: History  Substance Use Topics  . Smoking status: Former Smoker    Types: Cigarettes    Quit date: 03/22/1958  . Smokeless tobacco: Never Used  . Alcohol Use: No     Colonoscopy:  PAP:  Bone density:  Lipid panel:  Allergies  Allergen Reactions  . Levofloxacin Other (See Comments)    Current Outpatient Prescriptions  Medication Sig Dispense Refill  . aspirin EC 81 MG tablet Take by mouth.    [name] b complex vitamins capsule Take by mouth.    [name] lisinopril-hydrochlorothiazide (PRINZIDE,ZESTORETIC) 10-12.5 MG per tablet Take by mouth.    . Multiple Vitamins-Minerals (CENTRUM SILVER ADULT 50+) TABS Take by mouth.     No current facility-administered medications for this visit.    OBJECTIVE: BP 121/61 mmHg  Pulse 89  Temp(Src) 95.5 F (35.3 C) (Tympanic)  Resp 18  Ht 5\' 9"  (1.753 m)  Wt 150 lb 9.6 oz (68.312 kg)  BMI 22.23 kg/m2   Body mass index is 22.23  kg/(m^2).    ECOG FS:0 - Asymptomatic  General: Well-developed, well-nourished, no acute distress. Eyes: Pink conjunctiva, anicteric sclera. HEENT: Normocephalic, moist mucous membranes, clear oropharnyx. Lungs: Clear to auscultation bilaterally. Heart: Regular rate and rhythm. No rubs, murmurs, or gallops. Abdomen: Soft, nontender, nondistended. No organomegaly noted, normoactive  bowel sounds. Musculoskeletal: No edema, cyanosis, or clubbing. Neuro: Alert, answering all questions appropriately. Cranial nerves grossly intact. Skin: No rashes or petechiae noted. Psych: Normal affect.    LAB RESULTS:  Appointment on 10/21/2014  Component Date Value Ref Range Status  . Hemoglobin 10/21/2014 10.5* 13.0 - 18.0 g/dL Final    STUDIES: No results found.  ASSESSMENT: . Anemia related to chronic kidney disease Hemochromatosis  PLAN:   1. Anemia. Patient is closely followed every 2 weeks for use of Procrit. Today hemoglobin 10.5, target hemoglobin is greater than 10 and therefore he does not require Procrit injection today. 2. Hemochromatosis. Per Dr. Marylene [name] notes this is previously been discussed at length with patient and daughter. At this time we will continue to monitor as ferritin appears to be stable around 800.  We will continue with follow-up in approximately 12 weeks to see provider, and every 2 weeks for hemoglobin and possible Procrit.  Patient expressed understanding and was in agreement with this plan. He also understands that He can call clinic at any time with any questions, concerns, or complaints.   Dr. Oliva [name] was available for consultation and review of plan of care for this patient.    Gerald Kanner, NP   10/21/2014 4:52 PM

## 2014-10-25 ENCOUNTER — Encounter: Payer: Self-pay | Admitting: Family Medicine

## 2014-10-25 HISTORY — DX: Hemochromatosis, unspecified: E83.119

## 2014-11-04 ENCOUNTER — Inpatient Hospital Stay: Payer: Medicare Other

## 2014-11-04 DIAGNOSIS — D508 Other iron deficiency anemias: Secondary | ICD-10-CM

## 2014-11-04 DIAGNOSIS — D631 Anemia in chronic kidney disease: Secondary | ICD-10-CM | POA: Diagnosis not present

## 2014-11-04 LAB — HEMOGLOBIN: HEMOGLOBIN: 10.4 g/dL — AB (ref 13.0–18.0)

## 2014-11-18 ENCOUNTER — Inpatient Hospital Stay: Payer: Medicare Other | Attending: Family Medicine

## 2014-11-18 ENCOUNTER — Inpatient Hospital Stay: Payer: Medicare Other

## 2014-11-18 DIAGNOSIS — D631 Anemia in chronic kidney disease: Secondary | ICD-10-CM | POA: Diagnosis not present

## 2014-11-18 DIAGNOSIS — D508 Other iron deficiency anemias: Secondary | ICD-10-CM

## 2014-11-18 LAB — HEMOGLOBIN: HEMOGLOBIN: 10.4 g/dL — AB (ref 13.0–18.0)

## 2014-12-02 ENCOUNTER — Inpatient Hospital Stay: Payer: Medicare Other

## 2014-12-02 DIAGNOSIS — N189 Chronic kidney disease, unspecified: Principal | ICD-10-CM

## 2014-12-02 DIAGNOSIS — D631 Anemia in chronic kidney disease: Secondary | ICD-10-CM

## 2014-12-02 LAB — HEMOGLOBIN: Hemoglobin: 10.7 g/dL — ABNORMAL LOW (ref 13.0–18.0)

## 2014-12-16 ENCOUNTER — Inpatient Hospital Stay: Payer: Medicare Other

## 2014-12-16 DIAGNOSIS — D631 Anemia in chronic kidney disease: Secondary | ICD-10-CM | POA: Diagnosis not present

## 2014-12-16 DIAGNOSIS — N189 Chronic kidney disease, unspecified: Principal | ICD-10-CM

## 2014-12-16 LAB — HEMOGLOBIN: Hemoglobin: 11.2 g/dL — ABNORMAL LOW (ref 13.0–18.0)

## 2014-12-30 ENCOUNTER — Inpatient Hospital Stay: Payer: Medicare Other

## 2014-12-30 ENCOUNTER — Inpatient Hospital Stay: Payer: Medicare Other | Attending: Family Medicine

## 2014-12-30 DIAGNOSIS — D508 Other iron deficiency anemias: Secondary | ICD-10-CM

## 2014-12-30 DIAGNOSIS — Z8546 Personal history of malignant neoplasm of prostate: Secondary | ICD-10-CM | POA: Diagnosis not present

## 2014-12-30 DIAGNOSIS — Z87891 Personal history of nicotine dependence: Secondary | ICD-10-CM | POA: Diagnosis not present

## 2014-12-30 DIAGNOSIS — Z79899 Other long term (current) drug therapy: Secondary | ICD-10-CM | POA: Diagnosis not present

## 2014-12-30 DIAGNOSIS — Z8547 Personal history of malignant neoplasm of testis: Secondary | ICD-10-CM | POA: Diagnosis not present

## 2014-12-30 DIAGNOSIS — N189 Chronic kidney disease, unspecified: Secondary | ICD-10-CM | POA: Diagnosis not present

## 2014-12-30 DIAGNOSIS — I129 Hypertensive chronic kidney disease with stage 1 through stage 4 chronic kidney disease, or unspecified chronic kidney disease: Secondary | ICD-10-CM | POA: Diagnosis not present

## 2014-12-30 DIAGNOSIS — D631 Anemia in chronic kidney disease: Secondary | ICD-10-CM | POA: Insufficient documentation

## 2014-12-30 DIAGNOSIS — Z7982 Long term (current) use of aspirin: Secondary | ICD-10-CM | POA: Insufficient documentation

## 2014-12-30 LAB — HEMOGLOBIN: Hemoglobin: 11.8 g/dL — ABNORMAL LOW (ref 13.0–18.0)

## 2014-12-30 MED ORDER — EPOETIN ALFA 20000 UNIT/ML IJ SOLN: 20000.0000 [IU] | Freq: Once | INTRAMUSCULAR | Status: DC

## 2015-01-13 ENCOUNTER — Inpatient Hospital Stay: Payer: Medicare Other

## 2015-01-13 ENCOUNTER — Inpatient Hospital Stay (HOSPITAL_BASED_OUTPATIENT_CLINIC_OR_DEPARTMENT_OTHER): Payer: Medicare Other | Admitting: Family Medicine

## 2015-01-13 VITALS — BP 138/79 | HR 94 | Temp 96.6°F | Resp 18 | Wt 145.9 lb

## 2015-01-13 DIAGNOSIS — I129 Hypertensive chronic kidney disease with stage 1 through stage 4 chronic kidney disease, or unspecified chronic kidney disease: Secondary | ICD-10-CM

## 2015-01-13 DIAGNOSIS — D508 Other iron deficiency anemias: Secondary | ICD-10-CM

## 2015-01-13 DIAGNOSIS — Z8546 Personal history of malignant neoplasm of prostate: Secondary | ICD-10-CM

## 2015-01-13 DIAGNOSIS — N189 Chronic kidney disease, unspecified: Secondary | ICD-10-CM | POA: Diagnosis not present

## 2015-01-13 DIAGNOSIS — D631 Anemia in chronic kidney disease: Secondary | ICD-10-CM

## 2015-01-13 DIAGNOSIS — Z87891 Personal history of nicotine dependence: Secondary | ICD-10-CM

## 2015-01-13 DIAGNOSIS — Z79899 Other long term (current) drug therapy: Secondary | ICD-10-CM

## 2015-01-13 DIAGNOSIS — Z8547 Personal history of malignant neoplasm of testis: Secondary | ICD-10-CM

## 2015-01-13 DIAGNOSIS — Z7982 Long term (current) use of aspirin: Secondary | ICD-10-CM

## 2015-01-13 LAB — FERRITIN: Ferritin: 902 ng/mL — ABNORMAL HIGH (ref 24–336)

## 2015-01-13 LAB — HEMOGLOBIN: Hemoglobin: 11.5 g/dL — ABNORMAL LOW (ref 13.0–18.0)

## 2015-01-13 NOTE — Progress Notes (Deleted)
[place]  [phone]  [phone]     Gerald [name] DOB: [birth year]  MR#: [id number]  GYJ#:[id number]  Patient Care Team: [name], MD as PCP - General (Family Medicine)  CHIEF COMPLAINT:  Chief Complaint  Patient presents with  . Anemia    no complaints    INTERVAL HISTORY: ***  REVIEW OF SYSTEMS:   ROS  As per HPI. Otherwise, a complete review of systems is negatve.  ONCOLOGY HISTORY:  No history exists.    PAST MEDICAL HISTORY: Past Medical History  Diagnosis Date  . Hypertension   . Anemia   . Prostate cancer   . Cancer     Testicular  . Hemochromatosis 10/25/2014    PAST SURGICAL HISTORY: Past Surgical History  Procedure Laterality Date  . Hand surgery Bilateral   . Prostate surgery    . Appendectomy    . Cholecystectomy    . Inner ear surgery    . Surgery scrotal / testicular Right     FAMILY HISTORY Family History  Problem Relation Age of Onset  . Hypertension Mother     GYNECOLOGIC HISTORY:  No LMP for male patient.     ADVANCED DIRECTIVES:    HEALTH MAINTENANCE: Social History  Substance Use Topics  . Smoking status: Former Smoker    Types: Cigarettes    Quit date: 03/22/1958  . Smokeless tobacco: Never Used  . Alcohol Use: No     Colonoscopy:  PAP:  Bone density:  Lipid panel:  Allergies  Allergen Reactions  . Levofloxacin Other (See Comments)    Current Outpatient Prescriptions  Medication Sig Dispense Refill  . aspirin EC 81 MG tablet Take by mouth.    [name] b complex vitamins capsule Take by mouth.    . fluticasone (FLONASE) 50 MCG/ACT nasal spray Place 2 sprays into the nose daily.    . Multiple Vitamins-Minerals (CENTRUM SILVER ADULT 50+) TABS Take by mouth.    [name] lisinopril-hydrochlorothiazide (PRINZIDE,ZESTORETIC) 10-12.5 MG per tablet Take by mouth.     No current facility-administered medications for this visit.    OBJECTIVE: BP 138/79 mmHg  Pulse 94  Temp(Src)  96.6 F (35.9 C) (Tympanic)  Resp 18  Wt 145 lb 15.1 oz (66.2 kg)   Body mass index is 21.54 kg/(m^2).    ECOG FS:{CHL ONC [id number]  General: Well-developed, well-nourished, no acute distress. Eyes: Pink conjunctiva, anicteric sclera. HEENT: Normocephalic, moist mucous membranes, clear oropharnyx. Lungs: Clear to auscultation bilaterally. Heart: Regular rate and rhythm. No rubs, murmurs, or gallops. Abdomen: Soft, nontender, nondistended. No organomegaly noted, normoactive bowel sounds. Breast: Breast palpated in a circular manner in the sitting and supine positions.  No masses or fullness palpated.  Axilla palpated in both positions with no masses or fullness palpated.  Musculoskeletal: No edema, cyanosis, or clubbing. Neuro: Alert, answering all questions appropriately. Cranial nerves grossly intact. Skin: No rashes or petechiae noted. Psych: Normal affect. Lymphatics: No cervical, calvicular, axillary or inguinal LAD.   LAB RESULTS:  Appointment on 01/13/2015  Component Date Value Ref Range Status  . Hemoglobin 01/13/2015 11.5* 13.0 - 18.0 g/dL Final  . Ferritin 01/13/2015 902* 24 - 336 ng/mL Final    STUDIES: No results found.  ASSESSMENT:   PLAN:  No problem-specific assessment & plan notes found for this encounter.   Patient expressed understanding and was in agreement with this plan. He also understands that He can call clinic at any time with any questions, concerns, or complaints.  Dr. Oliva Bustard was available for consultation and review of plan of care for this patient.  No matching staging information was found for the patient.  Evlyn Kanner, NP   01/13/2015 1:20 PM

## 2015-01-13 NOTE — Progress Notes (Signed)
[place]  [phone]  [phone]     KARRIEM MUENCH DOB: [birth year]  MR#: [id number]  JOA#:[id number]  Patient Care Team: [name], MD as PCP - General (Family Medicine)  CHIEF COMPLAINT:  Chief Complaint  Patient presents with  . Anemia    no complaints   Patient with history of anemia related to chronic renal disease, currently on Procrit with target hemoglobin above 10 g. Also with hemachromatosis. Patient's ferritin level MC, iron saturation normal without use of oral iron, single gene mutation previously noted.  INTERVAL HISTORY:  Patient is here today for further follow-up and treatment consideration regarding anemia related to chronic renal disease as well as hemachromatosis. He reports feeling very well and denies any complaints today.He has been evaluated every 2 weeks for long-term therapy with the use of Procrit. Today his hemoglobin is 11.5. He reports overall feeling very well and has not received Procrit injections since August 2016.  REVIEW OF SYSTEMS:   Review of Systems  Constitutional: Negative for fever, chills, weight loss, malaise/fatigue and diaphoresis.  HENT: Negative for congestion, ear discharge, ear pain, hearing loss, nosebleeds, sore throat and tinnitus.   Eyes: Negative for blurred vision, double vision, photophobia, pain, discharge and redness.  Respiratory: Negative for cough, hemoptysis, sputum production, shortness of breath, wheezing and stridor.   Cardiovascular: Negative for chest pain, palpitations, orthopnea, claudication, leg swelling and PND.  Gastrointestinal: Negative for heartburn, nausea, vomiting, abdominal pain, diarrhea, constipation, blood in stool and melena.  Genitourinary: Negative.   Musculoskeletal: Negative.   Skin: Negative.   Neurological: Negative for dizziness, tingling, focal weakness, seizures, weakness and headaches.  Endo/Heme/Allergies: Does not bruise/bleed easily.    Psychiatric/Behavioral: Negative for depression. The patient is not nervous/anxious and does not have insomnia.     As per HPI. Otherwise, a complete review of systems is negatve.   PAST MEDICAL HISTORY: Past Medical History  Diagnosis Date  . Hypertension   . Anemia   . Prostate cancer   . Cancer     Testicular  . Hemochromatosis 10/25/2014    PAST SURGICAL HISTORY: Past Surgical History  Procedure Laterality Date  . Hand surgery Bilateral   . Prostate surgery    . Appendectomy    . Cholecystectomy    . Inner ear surgery    . Surgery scrotal / testicular Right     FAMILY HISTORY Family History  Problem Relation Age of Onset  . Hypertension Mother     GYNECOLOGIC HISTORY:  No LMP for male patient.     ADVANCED DIRECTIVES:    HEALTH MAINTENANCE: Social History  Substance Use Topics  . Smoking status: Former Smoker    Types: Cigarettes    Quit date: 03/22/1958  . Smokeless tobacco: Never Used  . Alcohol Use: No     Colonoscopy:  PAP:  Bone density:  Lipid panel:  Allergies  Allergen Reactions  . Levofloxacin Other (See Comments)    Current Outpatient Prescriptions  Medication Sig Dispense Refill  . aspirin EC 81 MG tablet Take by mouth.    [name] b complex vitamins capsule Take by mouth.    . fluticasone (FLONASE) 50 MCG/ACT nasal spray Place 2 sprays into the nose daily.    . Multiple Vitamins-Minerals (CENTRUM SILVER ADULT 50+) TABS Take by mouth.    [name] lisinopril-hydrochlorothiazide (PRINZIDE,ZESTORETIC) 10-12.5 MG per tablet Take by mouth.     No current facility-administered medications for this visit.    OBJECTIVE: BP 138/79 mmHg  Pulse 94  Temp(Src) 96.6 F (35.9 C) (Tympanic)  Resp 18  Wt 145 lb 15.1 oz (66.2 kg)   Body mass index is 21.54 kg/(m^2).    ECOG FS:0 - Asymptomatic  General: Well-developed, well-nourished, no acute distress. Eyes: Pink conjunctiva, anicteric sclera. HEENT: Normocephalic, moist mucous membranes, clear  oropharnyx. Lungs: Clear to auscultation bilaterally. Heart: Regular rate and rhythm. No rubs, murmurs, or gallops. Abdomen: Soft, nontender, nondistended. No organomegaly noted, normoactive bowel sounds. Musculoskeletal: No edema, cyanosis, or clubbing. Neuro: Alert, answering all questions appropriately. Cranial nerves grossly intact. Skin: No rashes or petechiae noted. Psych: Normal affect.    LAB RESULTS:  Appointment on 01/13/2015  Component Date Value Ref Range Status  . Hemoglobin 01/13/2015 11.5* 13.0 - 18.0 g/dL Final  . Ferritin 01/13/2015 902* 24 - 336 ng/mL Final    STUDIES: No results found.  ASSESSMENT: . Anemia related to chronic kidney disease Hemochromatosis  PLAN:   1. Anemia. Patient is closely followed every 2 weeks for use of Procrit. Today hemoglobin 11.5, target hemoglobin is greater than 10 and therefore he does not require Procrit injection today. 2. Hemochromatosis. Per Dr. [name] notes this is previously been discussed at length with patient and daughter. At this time we will continue to monitor as ferritin appears to be relatively stable.  We will continue to follow with lab only and possible Procrit on a monthly basis. He will continue with his next provider visit in approximately 3 months.  Patient expressed understanding and was in agreement with this plan. He also understands that He can call clinic at any time with any questions, concerns, or complaints.   Dr. Oliva Bustard was available for consultation and review of plan of care for this patient.   Evlyn Kanner, NP   01/13/2015 3:51 PM

## 2015-01-14 LAB — AFP TUMOR MARKER: AFP-Tumor Marker: 1.6 ng/mL (ref 0.0–8.3)

## 2015-02-17 ENCOUNTER — Inpatient Hospital Stay: Payer: Medicare Other | Attending: Internal Medicine

## 2015-02-17 ENCOUNTER — Inpatient Hospital Stay: Payer: Medicare Other

## 2015-02-17 DIAGNOSIS — N189 Chronic kidney disease, unspecified: Secondary | ICD-10-CM

## 2015-02-17 DIAGNOSIS — D631 Anemia in chronic kidney disease: Secondary | ICD-10-CM | POA: Insufficient documentation

## 2015-02-17 LAB — CBC WITH DIFFERENTIAL/PLATELET
BASOS PCT: 1 %
Basophils Absolute: 0 10*3/uL (ref 0–0.1)
EOS ABS: 0.2 10*3/uL (ref 0–0.7)
Eosinophils Relative: 3 %
HCT: 31.1 % — ABNORMAL LOW (ref 40.0–52.0)
HEMOGLOBIN: 10.6 g/dL — AB (ref 13.0–18.0)
LYMPHS ABS: 2.4 10*3/uL (ref 1.0–3.6)
Lymphocytes Relative: 28 %
MCH: 32.5 pg (ref 26.0–34.0)
MCHC: 33.9 g/dL (ref 32.0–36.0)
MCV: 95.9 fL (ref 80.0–100.0)
Monocytes Absolute: 0.9 10*3/uL (ref 0.2–1.0)
Monocytes Relative: 10 %
NEUTROS PCT: 58 %
Neutro Abs: 4.8 10*3/uL (ref 1.4–6.5)
Platelets: 370 10*3/uL (ref 150–440)
RBC: 3.24 MIL/uL — AB (ref 4.40–5.90)
RDW: 23.8 % — ABNORMAL HIGH (ref 11.5–14.5)
WBC: 8.3 10*3/uL (ref 3.8–10.6)

## 2015-03-24 ENCOUNTER — Inpatient Hospital Stay: Payer: Medicare Other | Attending: Internal Medicine

## 2015-03-24 ENCOUNTER — Inpatient Hospital Stay: Payer: Medicare Other

## 2015-03-24 DIAGNOSIS — N189 Chronic kidney disease, unspecified: Secondary | ICD-10-CM | POA: Diagnosis not present

## 2015-03-24 DIAGNOSIS — D631 Anemia in chronic kidney disease: Secondary | ICD-10-CM | POA: Insufficient documentation

## 2015-03-24 DIAGNOSIS — I129 Hypertensive chronic kidney disease with stage 1 through stage 4 chronic kidney disease, or unspecified chronic kidney disease: Secondary | ICD-10-CM | POA: Insufficient documentation

## 2015-03-24 LAB — CBC WITH DIFFERENTIAL/PLATELET
BASOS PCT: 2 %
Basophils Absolute: 0.2 10*3/uL — ABNORMAL HIGH (ref 0–0.1)
EOS ABS: 0.3 10*3/uL (ref 0–0.7)
EOS PCT: 3 %
HCT: 30.7 % — ABNORMAL LOW (ref 40.0–52.0)
Hemoglobin: 10.5 g/dL — ABNORMAL LOW (ref 13.0–18.0)
Lymphocytes Relative: 28 %
Lymphs Abs: 2.4 10*3/uL (ref 1.0–3.6)
MCH: 33.2 pg (ref 26.0–34.0)
MCHC: 34.4 g/dL (ref 32.0–36.0)
MCV: 96.6 fL (ref 80.0–100.0)
MONO ABS: 0.8 10*3/uL (ref 0.2–1.0)
MONOS PCT: 9 %
Neutro Abs: 5 10*3/uL (ref 1.4–6.5)
Neutrophils Relative %: 58 %
Platelets: 404 10*3/uL (ref 150–440)
RBC: 3.18 MIL/uL — ABNORMAL LOW (ref 4.40–5.90)
RDW: 25.4 % — AB (ref 11.5–14.5)
WBC: 8.6 10*3/uL (ref 3.8–10.6)

## 2015-04-21 ENCOUNTER — Inpatient Hospital Stay (HOSPITAL_BASED_OUTPATIENT_CLINIC_OR_DEPARTMENT_OTHER): Payer: Medicare Other | Admitting: Internal Medicine

## 2015-04-21 ENCOUNTER — Encounter: Payer: Self-pay | Admitting: Internal Medicine

## 2015-04-21 ENCOUNTER — Inpatient Hospital Stay: Payer: Medicare Other | Attending: Internal Medicine

## 2015-04-21 ENCOUNTER — Inpatient Hospital Stay: Payer: Medicare Other

## 2015-04-21 VITALS — BP 115/78 | HR 93 | Temp 96.8°F | Resp 18 | Ht 69.0 in | Wt 153.0 lb

## 2015-04-21 DIAGNOSIS — N189 Chronic kidney disease, unspecified: Principal | ICD-10-CM

## 2015-04-21 DIAGNOSIS — Z8546 Personal history of malignant neoplasm of prostate: Secondary | ICD-10-CM | POA: Insufficient documentation

## 2015-04-21 DIAGNOSIS — Z79899 Other long term (current) drug therapy: Secondary | ICD-10-CM

## 2015-04-21 DIAGNOSIS — Z87891 Personal history of nicotine dependence: Secondary | ICD-10-CM | POA: Insufficient documentation

## 2015-04-21 DIAGNOSIS — Z8547 Personal history of malignant neoplasm of testis: Secondary | ICD-10-CM | POA: Diagnosis not present

## 2015-04-21 DIAGNOSIS — I4891 Unspecified atrial fibrillation: Secondary | ICD-10-CM | POA: Diagnosis not present

## 2015-04-21 DIAGNOSIS — I129 Hypertensive chronic kidney disease with stage 1 through stage 4 chronic kidney disease, or unspecified chronic kidney disease: Secondary | ICD-10-CM | POA: Insufficient documentation

## 2015-04-21 DIAGNOSIS — Z23 Encounter for immunization: Secondary | ICD-10-CM | POA: Insufficient documentation

## 2015-04-21 DIAGNOSIS — D631 Anemia in chronic kidney disease: Secondary | ICD-10-CM

## 2015-04-21 DIAGNOSIS — Z7982 Long term (current) use of aspirin: Secondary | ICD-10-CM | POA: Insufficient documentation

## 2015-04-21 DIAGNOSIS — R42 Dizziness and giddiness: Secondary | ICD-10-CM | POA: Diagnosis not present

## 2015-04-21 LAB — CBC WITH DIFFERENTIAL/PLATELET
Basophils Absolute: 0.1 10*3/uL (ref 0–0.1)
Basophils Relative: 2 %
EOS ABS: 0.2 10*3/uL (ref 0–0.7)
EOS PCT: 3 %
HCT: 27.6 % — ABNORMAL LOW (ref 40.0–52.0)
Hemoglobin: 9.6 g/dL — ABNORMAL LOW (ref 13.0–18.0)
LYMPHS ABS: 2.2 10*3/uL (ref 1.0–3.6)
LYMPHS PCT: 28 %
MCH: 34.8 pg — AB (ref 26.0–34.0)
MCHC: 35 g/dL (ref 32.0–36.0)
MCV: 99.4 fL (ref 80.0–100.0)
MONO ABS: 0.8 10*3/uL (ref 0.2–1.0)
MONOS PCT: 10 %
Neutro Abs: 4.5 10*3/uL (ref 1.4–6.5)
Neutrophils Relative %: 57 %
PLATELETS: 375 10*3/uL (ref 150–440)
RBC: 2.77 MIL/uL — AB (ref 4.40–5.90)
RDW: 25.1 % — AB (ref 11.5–14.5)
WBC: 7.8 10*3/uL (ref 3.8–10.6)

## 2015-04-21 MED ORDER — INFLUENZA VAC SPLIT QUAD 0.5 ML IM SUSY
0.5000 mL | PREFILLED_SYRINGE | Freq: Once | INTRAMUSCULAR | Status: AC
  Administered 2015-04-21: 0.5 mL via INTRAMUSCULAR
  Filled 2015-04-21: qty 0.5

## 2015-04-21 MED ORDER — EPOETIN ALFA 20000 UNIT/ML IJ SOLN
20000.0000 [IU] | Freq: Once | INTRAMUSCULAR | Status: AC
  Administered 2015-04-21: 20000 [IU] via SUBCUTANEOUS
  Filled 2015-04-21: qty 1

## 2015-04-21 NOTE — Progress Notes (Signed)
Pt sometimes tired but gets things done. Also has atrial fib but on asa only. Dizziness upon position change. Seen ENT and PCP and found no cause for this

## 2015-04-21 NOTE — Progress Notes (Signed)
[facility] OFFICE PROGRESS NOTE  Patient Care Team: [name], MD as PCP - General (Family Medicine)   SUMMARY OF HEMATOLOGIC HISTORY:  # Anemia sec to ? CKD on Procrit 20000 Units []   # ? Hemochromatosis- heterozygous   INTERVAL HISTORY:  This is my first interaction with the patient since I joined the practice September 2016. I reviewed the patient's prior charts/pertinent labs  findings are summarized above.    a very pleasant 82 year old male patient was fairly active for his age  Currently on Procrit  For anemia secondary to CKD is here for follow-up.  The last Procrit was approximately in July 2016.    His appetite is good. His energy levels are adequate.  He denies any nausea vomiting.  Denies any chest pain or shortness of breath or cough. Denies any blood in stools or black stools.   He complains of intermittent dizziness/ question vertigo.  Also history of A. Fib on aspirin.   REVIEW OF SYSTEMS:  A complete 10 point review of system is done which is negative except mentioned above/history of present illness.   PAST MEDICAL HISTORY :  Past Medical History  Diagnosis Date  . Hypertension   . Anemia   . Prostate cancer ([place])   . Cancer [facility])     Testicular  . Hemochromatosis 10/25/2014  . Atrial fibrillation ([place])     PAST SURGICAL HISTORY :   Past Surgical History  Procedure Laterality Date  . Hand surgery Bilateral   . Prostate surgery    . Appendectomy    . Cholecystectomy    . Inner ear surgery    . Surgery scrotal / testicular Right     FAMILY HISTORY :   Family History  Problem Relation Age of Onset  . Hypertension Mother     SOCIAL HISTORY:   Social History  Substance Use Topics  . Smoking status: Former Smoker    Types: Cigarettes    Quit date: 03/22/1958  . Smokeless tobacco: Never Used  . Alcohol Use: No    ALLERGIES:  is allergic to levofloxacin.  MEDICATIONS:  Current Outpatient Prescriptions  Medication Sig  Dispense Refill  . aspirin EC 81 MG tablet Take by mouth.    [name] b complex vitamins capsule Take by mouth.    . Multiple Vitamins-Minerals (CENTRUM SILVER ADULT 50+) TABS Take by mouth.    [name] lisinopril-hydrochlorothiazide (PRINZIDE,ZESTORETIC) 10-12.5 MG per tablet Take 1 tablet by mouth daily.      Current Facility-Administered Medications  Medication Dose Route Frequency Provider Last Rate Last Dose  . Influenza vac split quadrivalent PF (FLUARIX) injection 0.5 mL  0.5 mL Intramuscular Once Cammie Sickle, MD        PHYSICAL EXAMINATION:   BP 115/78 mmHg  Pulse 93  Temp(Src) 96.8 F (36 C) (Tympanic)  Resp 18  Ht 5\' 9"  (1.753 m)  Wt 153 lb (69.4 kg)  BMI 22.58 kg/m2  Filed Weights   04/21/15 1015  Weight: 153 lb (69.4 kg)    GENERAL: Well-nourished well-developed; Alert, no distress and comfortable.   Accompanied by daughter.  EYES: no pallor or icterus OROPHARYNX: no thrush or ulceration; good dentition  NECK: supple, no masses felt LYMPH:  no palpable lymphadenopathy in the cervical, axillary or inguinal regions LUNGS: clear to auscultation and  No wheeze or crackles HEART/CVS: regular rate & irregular rhythm and no murmurs; No lower extremity edema ABDOMEN:abdomen soft, non-tender and normal bowel sounds Musculoskeletal:no cyanosis of digits and  no clubbing  PSYCH: alert & oriented x 3 with fluent speech NEURO: no focal motor/sensory deficits SKIN:  no rashes or significant lesions  LABORATORY DATA:  I have reviewed the data as listed    Component Value Date/Time   NA 142 05/06/2013 0158   K 3.7 05/06/2013 0158   CL 112* 05/06/2013 0158   CO2 24 05/06/2013 0158   GLUCOSE 95 05/06/2013 0158   BUN 32* 05/06/2013 0158   CREATININE 1.22 11/25/2013 1332   CALCIUM 9.1 05/06/2013 0158   PROT 6.1* 05/06/2013 0158   ALBUMIN 3.5 05/06/2013 0158   AST 22 05/06/2013 0158   ALT 18 05/06/2013 0158   ALKPHOS 60 05/06/2013 0158   BILITOT 1.4* 05/06/2013 0158    GFRNONAA 52* 11/25/2013 1332   GFRAA >60 11/25/2013 1332    No results found for: SPEP, UPEP  Lab Results  Component Value Date   WBC 7.8 04/21/2015   NEUTROABS 4.5 04/21/2015   HGB 9.6* 04/21/2015   HCT 27.6* 04/21/2015   MCV 99.4 04/21/2015   PLT 375 04/21/2015      Chemistry      Component Value Date/Time   NA 142 05/06/2013 0158   K 3.7 05/06/2013 0158   CL 112* 05/06/2013 0158   CO2 24 05/06/2013 0158   BUN 32* 05/06/2013 0158   CREATININE 1.22 11/25/2013 1332      Component Value Date/Time   CALCIUM 9.1 05/06/2013 0158   ALKPHOS 60 05/06/2013 0158   AST 22 05/06/2013 0158   ALT 18 05/06/2013 0158   BILITOT 1.4* 05/06/2013 0158       RADIOGRAPHIC STUDIES: I have personally reviewed the radiological images as listed and agreed with the findings in the report. No results found.   ASSESSMENT & PLAN:   #  Anemia secondary to CKD/ ? MDS  [no previous BMBx]-  Procrit 20,000 units;  He has been needing it every 6 months or so [ last July 2016].  Today hemoglobin is 9.7;  Proceed with Procrit.  #  Check CBC CMP/  Ferritin/ iron/ possible Procrit;  3 months;  Follow-up with me in 6 months with labs/ possible Procrit  # Hemochromatosis- Heterozygous- Ferrtin Sep 2016- ~900. No end organ damage.  #  I reviewed the plan of care with the patient and  His daughter in detail. They agree.  # 15 minutes face-to-face with the patient discussing the above plan of care; more than 50% of time spent on natural history; counseling and coordination.    Cammie Sickle, MD 04/21/2015 10:32 AM

## 2015-04-21 NOTE — Patient Instructions (Signed)
You received your influenza vaccination today.

## 2015-07-21 ENCOUNTER — Other Ambulatory Visit: Payer: Self-pay | Admitting: Internal Medicine

## 2015-07-21 ENCOUNTER — Inpatient Hospital Stay: Payer: Medicare Other

## 2015-07-21 ENCOUNTER — Inpatient Hospital Stay: Payer: Medicare Other | Attending: Internal Medicine

## 2015-07-21 VITALS — BP 122/69 | HR 97 | Temp 96.1°F | Resp 18

## 2015-07-21 DIAGNOSIS — N189 Chronic kidney disease, unspecified: Principal | ICD-10-CM

## 2015-07-21 DIAGNOSIS — Z79899 Other long term (current) drug therapy: Secondary | ICD-10-CM | POA: Insufficient documentation

## 2015-07-21 DIAGNOSIS — I129 Hypertensive chronic kidney disease with stage 1 through stage 4 chronic kidney disease, or unspecified chronic kidney disease: Secondary | ICD-10-CM | POA: Diagnosis present

## 2015-07-21 DIAGNOSIS — D631 Anemia in chronic kidney disease: Secondary | ICD-10-CM

## 2015-07-21 LAB — CBC WITH DIFFERENTIAL/PLATELET
BASOS ABS: 0.1 10*3/uL (ref 0–0.1)
BASOS PCT: 2 %
Eosinophils Absolute: 0.2 10*3/uL (ref 0–0.7)
Eosinophils Relative: 3 %
HEMATOCRIT: 26.9 % — AB (ref 40.0–52.0)
HEMOGLOBIN: 9.4 g/dL — AB (ref 13.0–18.0)
Lymphocytes Relative: 31 %
Lymphs Abs: 2.3 10*3/uL (ref 1.0–3.6)
MCH: 35.2 pg — ABNORMAL HIGH (ref 26.0–34.0)
MCHC: 35 g/dL (ref 32.0–36.0)
MCV: 100.6 fL — ABNORMAL HIGH (ref 80.0–100.0)
Monocytes Absolute: 0.7 10*3/uL (ref 0.2–1.0)
Monocytes Relative: 9 %
NEUTROS ABS: 4.3 10*3/uL (ref 1.4–6.5)
NEUTROS PCT: 55 %
Platelets: 361 10*3/uL (ref 150–440)
RBC: 2.67 MIL/uL — ABNORMAL LOW (ref 4.40–5.90)
RDW: 25.3 % — AB (ref 11.5–14.5)
WBC: 7.6 10*3/uL (ref 3.8–10.6)

## 2015-07-21 LAB — COMPREHENSIVE METABOLIC PANEL
ALBUMIN: 4.3 g/dL (ref 3.5–5.0)
ALK PHOS: 44 U/L (ref 38–126)
ALT: 15 U/L — ABNORMAL LOW (ref 17–63)
AST: 21 U/L (ref 15–41)
Anion gap: 6 (ref 5–15)
BILIRUBIN TOTAL: 1.5 mg/dL — AB (ref 0.3–1.2)
BUN: 25 mg/dL — AB (ref 6–20)
CO2: 26 mmol/L (ref 22–32)
Calcium: 9.3 mg/dL (ref 8.9–10.3)
Chloride: 107 mmol/L (ref 101–111)
Creatinine, Ser: 1.17 mg/dL (ref 0.61–1.24)
GFR calc Af Amer: >60 mL/min (ref >60)
GFR calc non Af Amer: 52 mL/min — ABNORMAL LOW (ref >60)
GLUCOSE: 107 mg/dL — AB (ref 65–99)
POTASSIUM: 4 mmol/L (ref 3.5–5.1)
Sodium: 139 mmol/L (ref 135–145)
TOTAL PROTEIN: 6.6 g/dL (ref 6.5–8.1)

## 2015-07-21 LAB — IRON AND TIBC
Iron: 118 ug/dL (ref 45–182)
Saturation Ratios: 48 % — ABNORMAL HIGH (ref 17.9–39.5)
TIBC: 248 ug/dL — ABNORMAL LOW (ref 250–450)
UIBC: 130 ug/dL

## 2015-07-21 LAB — FERRITIN: FERRITIN: 1021 ng/mL — AB (ref 24–336)

## 2015-07-21 MED ORDER — EPOETIN ALFA 20000 UNIT/ML IJ SOLN
20000.0000 [IU] | Freq: Once | INTRAMUSCULAR | Status: AC
  Administered 2015-07-21: 20000 [IU] via SUBCUTANEOUS
  Filled 2015-07-21: qty 1

## 2015-10-20 ENCOUNTER — Other Ambulatory Visit: Payer: Self-pay | Admitting: *Deleted

## 2015-10-20 ENCOUNTER — Inpatient Hospital Stay (HOSPITAL_BASED_OUTPATIENT_CLINIC_OR_DEPARTMENT_OTHER): Payer: Medicare Other | Admitting: Internal Medicine

## 2015-10-20 ENCOUNTER — Inpatient Hospital Stay: Payer: Medicare Other

## 2015-10-20 ENCOUNTER — Inpatient Hospital Stay: Payer: Medicare Other | Attending: Internal Medicine

## 2015-10-20 DIAGNOSIS — D638 Anemia in other chronic diseases classified elsewhere: Secondary | ICD-10-CM

## 2015-10-20 DIAGNOSIS — N189 Chronic kidney disease, unspecified: Secondary | ICD-10-CM | POA: Diagnosis not present

## 2015-10-20 DIAGNOSIS — I129 Hypertensive chronic kidney disease with stage 1 through stage 4 chronic kidney disease, or unspecified chronic kidney disease: Secondary | ICD-10-CM | POA: Insufficient documentation

## 2015-10-20 DIAGNOSIS — Z7982 Long term (current) use of aspirin: Secondary | ICD-10-CM | POA: Insufficient documentation

## 2015-10-20 DIAGNOSIS — Z87891 Personal history of nicotine dependence: Secondary | ICD-10-CM

## 2015-10-20 DIAGNOSIS — Z79899 Other long term (current) drug therapy: Secondary | ICD-10-CM

## 2015-10-20 DIAGNOSIS — D631 Anemia in chronic kidney disease: Secondary | ICD-10-CM | POA: Diagnosis not present

## 2015-10-20 DIAGNOSIS — D509 Iron deficiency anemia, unspecified: Secondary | ICD-10-CM

## 2015-10-20 DIAGNOSIS — I4891 Unspecified atrial fibrillation: Secondary | ICD-10-CM | POA: Insufficient documentation

## 2015-10-20 DIAGNOSIS — Z8547 Personal history of malignant neoplasm of testis: Secondary | ICD-10-CM | POA: Diagnosis not present

## 2015-10-20 LAB — CBC WITH DIFFERENTIAL/PLATELET
Basophils Absolute: 0.1 10*3/uL (ref 0–0.1)
Basophils Relative: 2 %
EOS ABS: 0.1 10*3/uL (ref 0–0.7)
EOS PCT: 2 %
HCT: 30.3 % — ABNORMAL LOW (ref 40.0–52.0)
Hemoglobin: 10.5 g/dL — ABNORMAL LOW (ref 13.0–18.0)
LYMPHS ABS: 2.1 10*3/uL (ref 1.0–3.6)
LYMPHS PCT: 34 %
MCH: 35.1 pg — AB (ref 26.0–34.0)
MCHC: 34.5 g/dL (ref 32.0–36.0)
MCV: 101.7 fL — AB (ref 80.0–100.0)
MONO ABS: 0.6 10*3/uL (ref 0.2–1.0)
MONOS PCT: 9 %
Neutro Abs: 3.2 10*3/uL (ref 1.4–6.5)
Neutrophils Relative %: 53 %
PLATELETS: 348 10*3/uL (ref 150–440)
RBC: 2.98 MIL/uL — ABNORMAL LOW (ref 4.40–5.90)
RDW: 23.4 % — ABNORMAL HIGH (ref 11.5–14.5)
WBC: 6.1 10*3/uL (ref 3.8–10.6)

## 2015-10-20 NOTE — Progress Notes (Signed)
[place] OFFICE PROGRESS NOTE  Patient Care Team: [name], MD as PCP - General (Family Medicine)   SUMMARY OF HEMATOLOGIC HISTORY:  # Anemia sec to ? CKD  Macrocytic ? MDS- no BMBx] on Procrit 20000 Units q ? 80M  # ? Hemochromatosis- heterozygous [ferritin 800-900s] - on surveillance.   INTERVAL HISTORY:   A very pleasant 82 year old [name] patient was fairly active for his age  Currently on Procrit  For anemia secondary to CKD is here for follow-up.  Last Procrit was approximately 6 months ago.  He denies any complaints today.  His appetite is good. His energy levels are adequate.  He denies any nausea vomiting.  Denies any chest pain or shortness of breath or cough. Denies any blood in stools or black stools.   REVIEW OF SYSTEMS:  A complete 10 point review of system is done which is negative except mentioned above/history of present illness.   PAST MEDICAL HISTORY :  Past Medical History:  Diagnosis Date  . Anemia   . Atrial fibrillation ([place])   . Cancer [facility])    Testicular  . Hemochromatosis 10/25/2014  . Hypertension   . Prostate cancer ([place])     PAST SURGICAL HISTORY :   Past Surgical History:  Procedure Laterality Date  . APPENDECTOMY    . CHOLECYSTECTOMY    . HAND SURGERY Bilateral   . INNER EAR SURGERY    . PROSTATE SURGERY    . SURGERY SCROTAL / TESTICULAR Right     FAMILY HISTORY :   Family History  Problem Relation Age of Onset  . Hypertension Mother     SOCIAL HISTORY:   Social History  Substance Use Topics  . Smoking status: Former Smoker    Types: Cigarettes    Quit date: 03/22/1958  . Smokeless tobacco: Never Used  . Alcohol use No    ALLERGIES:  is allergic to levofloxacin.  MEDICATIONS:  Current Outpatient Prescriptions  Medication Sig Dispense Refill  . aspirin EC Gerald MG tablet Take by mouth.    [name] b complex vitamins capsule Take by mouth.    . Multiple Vitamins-Minerals (CENTRUM SILVER ADULT 50+) TABS Take by  mouth.    [name] lisinopril-hydrochlorothiazide (PRINZIDE,ZESTORETIC) 10-12.5 MG per tablet Take 1 tablet by mouth daily.     [name] lisinopril-hydrochlorothiazide (PRINZIDE,ZESTORETIC) 10-12.5 MG tablet      No current facility-administered medications for this visit.     PHYSICAL EXAMINATION:   BP 113/69 (BP Location: Left Arm, Patient Position: Sitting)   Pulse 93   Temp 98.7 F (37.1 C) (Tympanic)   Resp 18   Wt 148 lb 9 oz (67.4 kg)   BMI 21.94 kg/m   Filed Weights   10/20/15 1042  Weight: 148 lb 9 oz (67.4 kg)    GENERAL: Well-nourished well-developed; Alert, no distress and comfortable. He is alone. Walks himself. EYES: no pallor or icterus OROPHARYNX: no thrush or ulceration; good dentition  NECK: supple, no masses felt LYMPH:  no palpable lymphadenopathy in the cervical, axillary or inguinal regions LUNGS: clear to auscultation and  No wheeze or crackles HEART/CVS: regular rate & irregular rhythm and no murmurs; No lower extremity edema ABDOMEN:abdomen soft, non-tender and normal bowel sounds Musculoskeletal:no cyanosis of digits and no clubbing  PSYCH: alert & oriented x 3 with fluent speech NEURO: no focal motor/sensory deficits SKIN:  no rashes or significant lesions  LABORATORY DATA:  I have reviewed the data as listed    Component Value Date/Time  NA 139 07/21/2015 1011   NA 142 05/06/2013 0158   K 4.0 07/21/2015 1011   K 3.7 05/06/2013 0158   CL 107 07/21/2015 1011   CL 112 (H) 05/06/2013 0158   CO2 26 07/21/2015 1011   CO2 24 05/06/2013 0158   GLUCOSE 107 (H) 07/21/2015 1011   GLUCOSE 95 05/06/2013 0158   BUN 25 (H) 07/21/2015 1011   BUN 32 (H) 05/06/2013 0158   CREATININE 1.17 07/21/2015 1011   CREATININE 1.22 11/25/2013 1332   CALCIUM 9.3 07/21/2015 1011   CALCIUM 9.1 05/06/2013 0158   PROT 6.6 07/21/2015 1011   PROT 6.1 (L) 05/06/2013 0158   ALBUMIN 4.3 07/21/2015 1011   ALBUMIN 3.5 05/06/2013 0158   AST 21 07/21/2015 1011   AST 22 05/06/2013  0158   ALT 15 (L) 07/21/2015 1011   ALT 18 05/06/2013 0158   ALKPHOS 44 07/21/2015 1011   ALKPHOS 60 05/06/2013 0158   BILITOT 1.5 (H) 07/21/2015 1011   BILITOT 1.4 (H) 05/06/2013 0158   GFRNONAA 52 (L) 07/21/2015 1011   GFRNONAA 52 (L) 11/25/2013 1332   GFRAA >60 07/21/2015 1011   GFRAA >60 11/25/2013 1332    No results found for: SPEP, UPEP  Lab Results  Component Value Date   WBC 6.1 10/20/2015   NEUTROABS 3.2 10/20/2015   HGB 10.5 (L) 10/20/2015   HCT 30.3 (L) 10/20/2015   MCV 101.7 (H) 10/20/2015   PLT 348 10/20/2015      Chemistry      Component Value Date/Time   NA 139 07/21/2015 1011   NA 142 05/06/2013 0158   K 4.0 07/21/2015 1011   K 3.7 05/06/2013 0158   CL 107 07/21/2015 1011   CL 112 (H) 05/06/2013 0158   CO2 26 07/21/2015 1011   CO2 24 05/06/2013 0158   BUN 25 (H) 07/21/2015 1011   BUN 32 (H) 05/06/2013 0158   CREATININE 1.17 07/21/2015 1011   CREATININE 1.22 11/25/2013 1332      Component Value Date/Time   CALCIUM 9.3 07/21/2015 1011   CALCIUM 9.1 05/06/2013 0158   ALKPHOS 44 07/21/2015 1011   ALKPHOS 60 05/06/2013 0158   AST 21 07/21/2015 1011   AST 22 05/06/2013 0158   ALT 15 (L) 07/21/2015 1011   ALT 18 05/06/2013 0158   BILITOT 1.5 (H) 07/21/2015 1011   BILITOT 1.4 (H) 05/06/2013 0158       RADIOGRAPHIC STUDIES: I have personally reviewed the radiological images as listed and agreed with the findings in the report. No results found.   ASSESSMENT & PLAN:   Anemia of chronic disease #  Anemia secondary to CKD/ ? MDS  [no previous BMBx]-  Procrit 20,000 units;  He has been needing it every 6 months or so. Today hemoglobin is 10.2 he will not need Procrit.   #  Check CBC CMP/  Ferritin/ iron/ possible Procrit;  3 months;  Follow-up with me in 6 months with labs/ possible Procrit  # Hemochromatosis- Heterozygous- Ferrtin Sep 2016- ~900. No end organ damage.Discussed that I would not recommend phlebotomy given his anemia.     Cammie Sickle, MD 10/21/2015 12:17 PM

## 2015-10-21 DIAGNOSIS — D638 Anemia in other chronic diseases classified elsewhere: Secondary | ICD-10-CM | POA: Insufficient documentation

## 2015-10-21 NOTE — Assessment & Plan Note (Signed)
#    Anemia secondary to CKD/ ? MDS  [no previous BMBx]-  Procrit 20,000 units;  He has been needing it every 6 months or so. Today hemoglobin is 10.2 he will not need Procrit.   #  Check CBC CMP/  Ferritin/ iron/ possible Procrit;  3 months;  Follow-up with me in 6 months with labs/ possible Procrit  # Hemochromatosis- Heterozygous- Ferrtin Sep 2016- ~900. No end organ damage.Discussed that I would not recommend phlebotomy given his anemia.

## 2015-11-09 ENCOUNTER — Emergency Department: Payer: Medicare Other

## 2015-11-09 ENCOUNTER — Emergency Department
Admission: EM | Admit: 2015-11-09 | Discharge: 2015-11-09 | Disposition: A | Discharge status: Home / Self Care | Payer: Medicare Other | Attending: Emergency Medicine | Admitting: Emergency Medicine

## 2015-11-09 ENCOUNTER — Encounter: Payer: Self-pay | Admitting: Emergency Medicine

## 2015-11-09 DIAGNOSIS — M5136 Other intervertebral disc degeneration, lumbar region: Secondary | ICD-10-CM | POA: Insufficient documentation

## 2015-11-09 DIAGNOSIS — M4850XS Collapsed vertebra, not elsewhere classified, site unspecified, sequela of fracture: Secondary | ICD-10-CM

## 2015-11-09 DIAGNOSIS — X58XXXA Exposure to other specified factors, initial encounter: Secondary | ICD-10-CM | POA: Insufficient documentation

## 2015-11-09 DIAGNOSIS — Z8547 Personal history of malignant neoplasm of testis: Secondary | ICD-10-CM | POA: Diagnosis not present

## 2015-11-09 DIAGNOSIS — I4891 Unspecified atrial fibrillation: Secondary | ICD-10-CM | POA: Insufficient documentation

## 2015-11-09 DIAGNOSIS — Z8546 Personal history of malignant neoplasm of prostate: Secondary | ICD-10-CM | POA: Diagnosis not present

## 2015-11-09 DIAGNOSIS — G8929 Other chronic pain: Secondary | ICD-10-CM | POA: Diagnosis not present

## 2015-11-09 DIAGNOSIS — Z87891 Personal history of nicotine dependence: Secondary | ICD-10-CM | POA: Insufficient documentation

## 2015-11-09 DIAGNOSIS — Z7982 Long term (current) use of aspirin: Secondary | ICD-10-CM | POA: Diagnosis not present

## 2015-11-09 DIAGNOSIS — M549 Dorsalgia, unspecified: Secondary | ICD-10-CM

## 2015-11-09 DIAGNOSIS — Z79899 Other long term (current) drug therapy: Secondary | ICD-10-CM | POA: Insufficient documentation

## 2015-11-09 DIAGNOSIS — S32020S Wedge compression fracture of second lumbar vertebra, sequela: Secondary | ICD-10-CM | POA: Insufficient documentation

## 2015-11-09 MED ORDER — HYDROCODONE-ACETAMINOPHEN 5-325 MG PO TABS: 1.0000 | ORAL_TABLET | Freq: Two times a day (BID) | ORAL | 0 refills | Status: DC | PRN

## 2015-11-09 MED ORDER — HYDROCODONE-ACETAMINOPHEN 5-325 MG PO TABS
1.0000 | ORAL_TABLET | Freq: Once | ORAL | Status: AC
  Administered 2015-11-09: 1 via ORAL
  Filled 2015-11-09: qty 1

## 2015-11-09 NOTE — ED Provider Notes (Signed)
[facility] Emergency Department Provider Note ____________________________________________  Time seen: 1651  I have reviewed the triage vital signs and the nursing notes.  HISTORY  Chief Complaint  Back Pain  HPI Gerald [name] is a 82 y.o. male presents to the ED for evaluation of acute flare of his chronic low back pain. Patient with years of chronic low back pain due to underlying arthritis, presents today with recent increase in pain to the right hip and buttocks. He was evaluated and treated by his primary care provider about 2 weeks prior, at the onset. He is been tolerating tramadol with limited benefit. He is also prescribed a prednisone taper pack with limited benefit. He presents today denies any interim injury, fall, trauma, or accident. He denies anyincontinence, foot drop, or distal paresthesias. He normally ambulates with a single-point cane, although he has a walker as needed.   Past Medical History:  Diagnosis Date  . Anemia   . Atrial fibrillation ([place])   . Cancer [facility])    Testicular  . Hemochromatosis 10/25/2014  . Hypertension   . Prostate cancer [facility])     Patient Active Problem List   Diagnosis Date Noted  . Anemia of chronic disease 10/21/2015  . Hemochromatosis 10/25/2014  . BP (high blood pressure) 10/21/2014  . CA of prostate ([place]) 10/21/2014  . Cancer of testicle ([place]) 10/21/2014  . Anemia 08/24/2014  . Dizziness 09/14/2013  . Difficulty in walking 09/14/2013  . 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 A-fib ([place]) 06/24/2013  . MI (mitral incompetence) 06/24/2013  . Absolute anemia 06/23/2013  . Arthritis, degenerative 06/23/2013  . Essential (primary) hypertension 06/23/2013    Past Surgical History:  Procedure Laterality Date  . APPENDECTOMY    . CHOLECYSTECTOMY    . HAND SURGERY Bilateral   . INNER EAR SURGERY    . PROSTATE SURGERY    . SURGERY SCROTAL / TESTICULAR Right     Prior to Admission medications    Medication Sig Start Date End Date Taking? Authorizing Provider  aspirin EC 81 MG tablet Take by mouth.    Historical Provider, MD  b complex vitamins capsule Take by mouth.    Historical Provider, MD  HYDROcodone-acetaminophen (NORCO) 5-325 MG tablet Take 1 tablet by mouth 2 (two) times daily as needed. 11/09/15   Jarmal Lewelling V Bacon Vikkie Goeden, PA-C  lisinopril-hydrochlorothiazide (PRINZIDE,ZESTORETIC) 10-12.5 MG per tablet Take 1 tablet by mouth daily.  11/01/13 11/01/14  Historical Provider, MD  lisinopril-hydrochlorothiazide (PRINZIDE,ZESTORETIC) 10-12.5 MG tablet  09/19/15   Historical Provider, MD  Multiple Vitamins-Minerals (CENTRUM SILVER ADULT 50+) TABS Take by mouth.    Historical Provider, MD    Allergies Levofloxacin  Family History  Problem Relation Age of Onset  . Hypertension Mother     Social History Social History  Substance Use Topics  . Smoking status: Former Smoker    Types: Cigarettes    Quit date: 03/22/1958  . Smokeless tobacco: Never Used  . Alcohol use No   Review of Systems  Constitutional: Negative for fever. Cardiovascular: Negative for chest pain. Respiratory: Negative for shortness of breath. Gastrointestinal: Negative for abdominal pain, vomiting and diarrhea. Genitourinary: Negative for dysuria. Musculoskeletal: Positive for back pain. Skin: Negative for rash. Neurological: Negative for headaches, focal weakness or numbness. ____________________________________________  PHYSICAL EXAM:  VITAL SIGNS: ED Triage Vitals  Enc Vitals Group     BP 11/09/15 1540 119/76     Pulse Rate 11/09/15 1540 90     Resp 11/09/15 1540 18  Temp 11/09/15 1540 97.8 F (36.6 C)     Temp Source 11/09/15 1540 Oral     SpO2 11/09/15 1540 99 %     Weight 11/09/15 1541 149 lb (67.6 kg)     Height 11/09/15 1541 5\' 9"  (1.753 m)     Head Circumference --      Peak Flow --      Pain Score 11/09/15 1541 10     Pain Loc --      Pain Edu? --      Excl. in [place]? --     Constitutional: Alert and oriented. Well appearing and in no distress. Head: Normocephalic and atraumatic. Cardiovascular: Normal rate, regular rhythm.  Respiratory: Normal respiratory effort. No wheezes/rales/rhonchi. Gastrointestinal: Soft and nontender. No distention. Musculoskeletal: Patient with normal spinal alignment without midline tenderness, spasm, deformity, step-off. He is mildly tender to palpation over the left hip and buttocks region. He transitions slowly from sitting in his wheelchair to standing.  Nontender with normal range of motion in all extremities.  Neurologic:  CN II-XII grossly intact. Normal speech and language. No gross focal neurologic deficits are appreciated. Skin:  Skin is warm, dry and intact. No rash noted. ____________________________________________   RADIOLOGY  LS   IMPRESSION: Aortic atherosclerosis. Probable acute superior endplate fracture of L2 vertebral body. CT or MRI scan may be performed for further evaluation.  Lumbar CT w/o contrast  IMPRESSION: 1. The fracture of L2 is of unclear chronicity. It may be old given that there is no associated paravertebral edema/hemorrhage. 2. There is also depression of the upper endplate of L3 that is likely due to age large Schmorl's node. 3. There are significant degenerative changes with central stenosis and lateral recess narrowing as described. Degenerative changes and central stenosis is greatest at L4-L5. 4. No convincing disc herniation. ____________________________________________  PROCEDURES  Norco 5-325 mg PO ____________________________________________  INITIAL IMPRESSION / ASSESSMENT AND PLAN / ED COURSE  Patient with an acute flare of his chronic low back pain. He appears to have significant degenerative disc disease, spurring, and arthritis of the lumbar spine. No clear single etiology for his ongoing back pain, and is likely multifactorial given his radiology findings. Patient is  reassured that the findings of the x-ray are not supported by CT scan and there is no indication of an acute lumbar compression fracture. He is discharged with a prescription for hydrocodone to dose as needed primarily at night, for his acute back pain. He will follow-up with his primary care provider for ongoing symptom management.  Clinical Course   ____________________________________________  FINAL CLINICAL IMPRESSION(S) / ED DIAGNOSES  Final diagnoses:  Chronic back pain  DDD (degenerative disc disease), lumbar  Compression fracture of vertebra, non-traumatic, sequela     Melvenia Needles, PA-C 11/11/15 [place], MD 11/11/15 2048

## 2015-11-09 NOTE — Discharge Instructions (Signed)
Take the prescription med as directed. Follow-up with Dr. Kary Kos and [facility] as planned. Return to the ED for increased pain, foot drop, leg weakness, or loss of bladder/bowel control.

## 2015-11-09 NOTE — ED Triage Notes (Signed)
Pt here for low back pain that has been ongoing for 2 weeks. Was seen by his PCP.

## 2015-11-11 ENCOUNTER — Encounter: Payer: Self-pay | Admitting: Physician Assistant

## 2015-11-15 ENCOUNTER — Other Ambulatory Visit: Payer: Self-pay | Admitting: Neurological Surgery

## 2015-11-15 DIAGNOSIS — M545 Low back pain, unspecified: Secondary | ICD-10-CM

## 2015-11-15 DIAGNOSIS — M4856XA Collapsed vertebra, not elsewhere classified, lumbar region, initial encounter for fracture: Secondary | ICD-10-CM

## 2015-11-23 ENCOUNTER — Ambulatory Visit
Admission: RE | Admit: 2015-11-23 | Discharge: 2015-11-23 | Disposition: A | Discharge status: Home / Self Care | Payer: Medicare Other | Source: Ambulatory Visit | Attending: Neurological Surgery | Admitting: Neurological Surgery

## 2015-11-23 DIAGNOSIS — M4856XA Collapsed vertebra, not elsewhere classified, lumbar region, initial encounter for fracture: Secondary | ICD-10-CM

## 2015-11-23 DIAGNOSIS — M4806 Spinal stenosis, lumbar region: Secondary | ICD-10-CM | POA: Insufficient documentation

## 2015-11-23 DIAGNOSIS — M545 Low back pain, unspecified: Secondary | ICD-10-CM

## 2015-11-23 DIAGNOSIS — M47896 Other spondylosis, lumbar region: Secondary | ICD-10-CM | POA: Diagnosis not present

## 2015-11-23 DIAGNOSIS — M4306 Spondylolysis, lumbar region: Secondary | ICD-10-CM | POA: Insufficient documentation

## 2015-11-23 LAB — POCT I-STAT CREATININE: CREATININE: 1.2 mg/dL (ref 0.61–1.24)

## 2015-11-23 MED ORDER — GADOBENATE DIMEGLUMINE 529 MG/ML IV SOLN
15.0000 mL | Freq: Once | INTRAVENOUS | Status: AC | PRN
Start: 2015-11-23 — End: 2015-11-23
  Administered 2015-11-23: 13 mL via INTRAVENOUS

## 2015-11-29 ENCOUNTER — Ambulatory Visit: Payer: Medicare Other

## 2015-12-11 ENCOUNTER — Encounter: Payer: Medicare Other | Attending: Surgery | Admitting: Surgery

## 2015-12-11 DIAGNOSIS — L89311 Pressure ulcer of right buttock, stage 1: Secondary | ICD-10-CM | POA: Diagnosis not present

## 2015-12-11 DIAGNOSIS — I4891 Unspecified atrial fibrillation: Secondary | ICD-10-CM | POA: Diagnosis not present

## 2015-12-11 DIAGNOSIS — Z8546 Personal history of malignant neoplasm of prostate: Secondary | ICD-10-CM | POA: Insufficient documentation

## 2015-12-11 DIAGNOSIS — Z79899 Other long term (current) drug therapy: Secondary | ICD-10-CM | POA: Insufficient documentation

## 2015-12-11 DIAGNOSIS — M1990 Unspecified osteoarthritis, unspecified site: Secondary | ICD-10-CM | POA: Insufficient documentation

## 2015-12-11 DIAGNOSIS — I10 Essential (primary) hypertension: Secondary | ICD-10-CM | POA: Diagnosis not present

## 2015-12-11 DIAGNOSIS — Z87891 Personal history of nicotine dependence: Secondary | ICD-10-CM | POA: Diagnosis not present

## 2015-12-11 DIAGNOSIS — Z7982 Long term (current) use of aspirin: Secondary | ICD-10-CM | POA: Diagnosis not present

## 2015-12-11 DIAGNOSIS — M4856XA Collapsed vertebra, not elsewhere classified, lumbar region, initial encounter for fracture: Secondary | ICD-10-CM | POA: Diagnosis not present

## 2015-12-11 NOTE — Progress Notes (Addendum)
Gerald [name], Gerald [name] ([id number]) Visit Report for 12/11/2015 Chief Complaint Document Details Gerald [name], Gerald [name] 0000000 2:15 Patient Name: Date of Service: [name]: [id number] Number: Treating RN: Montey Hora Date of Birth/Sex: [birth year] (82 y.o. Male) Other Clinician: Primary Care Physician: [name] Treating Christin Fudge Referring Physician: Era Bumpers Physician/Extender: Suella Grove in Treatment: 0 Information Obtained from: Patient Chief Complaint Patient is at the clinic for treatment of an open pressure ulcer in the region of the right gluteal area for about 2 weeks now Electronic Signature(s) Signed: 12/11/2015 2:54:28 PM By: Christin Fudge MD, FACS Entered By: Christin Fudge on [id number] Gerald [name] ([id number]) -------------------------------------------------------------------------------- HPI Details Gerald [name] 0000000 2:15 Patient Name: Date of Service: [name]: [id number] Number: Treating RN: Montey Hora Date of Birth/Sex: 10-17-1920 (82 y.o. Male) Other Clinician: Primary Care Physician: [name] Treating Christin Fudge Referring Physician: Era Bumpers Physician/Extender: Suella Grove in Treatment: 0 History of Present Illness Location: right gluteal area Quality: Patient reports experiencing a dull pain to affected area(s). Severity: Patient states wound are getting worse. Duration: Patient has had the wound for < 2 weeks prior to presenting for treatment Timing: Pain in wound is Intermittent (comes and goes Context: The wound appeared gradually over time Modifying Factors: Other treatment(s) tried include:has recently been diagnosed with a L2 compression fracture and due to this he has a lot of pain Associated Signs and Symptoms: problems laying down in bed HPI Description: 82 year old gentleman who has the chief complaints of  severe back pain and has been seen by his neurosurgeon Dr. Era Bumpers. He was referred to [place] for some skin in breakdown in his gluteal region due to constantly sitting in his wheelchair. He is also being treated for compression fracture of his lumbar spine Past medical history significant for atrial fibrillation, hypertension, prostate cancer, testicular cancer, status post open cholecystectomy, prostate surgery, hernia repair. He is a former smoker. Electronic Signature(s) Signed: 12/11/2015 2:54:36 PM By: Christin Fudge MD, FACS Previous Signature: 12/11/2015 2:52:02 PM Version By: Christin Fudge MD, FACS Previous Signature: 12/11/2015 2:25:28 PM Version By: Christin Fudge MD, FACS Entered By: Christin Fudge on [id number] Gerald [name], Gerald [name] ([id number]) -------------------------------------------------------------------------------- Physical Exam Details Gerald [name], Gerald [name] 0000000 2:15 Patient Name: Date of Service: [name]: [id number] Number: Treating RN: Montey Hora Date of Birth/Sex: [birth year] (82 y.o. Male) Other Clinician: Primary Care Physician: [name] Treating Christin Fudge Referring Physician: Era Bumpers Physician/Extender: Suella Grove in Treatment: 0 Constitutional . Pulse regular. Respirations normal and unlabored. Afebrile. . Eyes Nonicteric. Reactive to light. Ears, Nose, Mouth, and Throat Lips, teeth, and gums WNL.[name] Moist mucosa without lesions. Neck supple and nontender. No palpable supraclavicular or cervical adenopathy. Normal sized without goiter. Respiratory WNL. No retractions.. Cardiovascular Pedal Pulses WNL. No clubbing, cyanosis or edema. Gastrointestinal (GI) Abdomen without masses or tenderness.. No liver or spleen enlargement or tenderness.. Lymphatic No adneopathy. No adenopathy. No adenopathy. Musculoskeletal Adexa without tenderness or enlargement.. Digits and nails w/o clubbing,  cyanosis, infection, petechiae, ischemia, or inflammatory conditions.. Integumentary (Hair, Skin) No suspicious lesions. No crepitus or fluctuance. No peri-wound warmth or erythema. No masses.[name] Psychiatric Judgement and insight Intact.. No evidence of depression, anxiety, or agitation.. Notes Stage 1 pressure injury to the right gluteal area just to the right of the midline and other than that is no evidence of cellulitis or deeper injury Electronic Signature(s) Signed: 12/11/2015 2:55:28 PM By: Christin Fudge MD, FACS Entered  By: Christin Fudge on [id number] Gerald [name] ([id number]) -------------------------------------------------------------------------------- Physician Orders Details Gerald [name], Gerald [name] 0000000 2:15 Patient Name: Date of Service: [name]: [id number] Number: Treating RN: Cornell Barman Date of Birth/Sex: 1920-11-03 (82 y.o. Male) Other Clinician: Primary Care Physician: [name] Treating Christin Fudge Referring Physician: Era Bumpers Physician/Extender: Suella Grove in Treatment: 0 Verbal / Phone Orders: Yes Clinician: Cornell Barman Read Back and Verified: Yes Diagnosis Coding Wound Cleansing Wound #1 Right Gluteus o Clean wound with Normal Saline. Primary Wound Dressing Wound #1 Right Gluteus o Prisma Ag Secondary Dressing Wound #1 Right Gluteus o Boardered Foam Dressing Dressing Change Frequency Wound #1 Right Gluteus o Change dressing every other day. Follow-up Appointments Wound #1 Right Gluteus o Return Appointment in 1 week. Off-Loading Wound #1 Right Gluteus o Turn and reposition every 2 hours Electronic Signature(s) Signed: 12/11/2015 3:04:46 PM By: Christin Fudge MD, FACS Signed: 12/11/2015 4:44:50 PM By: Gretta Cool RN, BSN, Kim RN, BSN Entered By: Gretta Cool, RN, BSN, Kim on [id number] Gerald [name], Gerald [name]  ([id number]) -------------------------------------------------------------------------------- Problem List Details Gerald [name], Gerald [name] 0000000 2:15 Patient Name: Date of Service: [name]: [id number] Number: Treating RN: Date of Birth/Sex: Feb 09, 1921 (82 y.o. Male) Other Clinician: Primary Care Physician: [name] Treating Referring Physician: Era Bumpers Physician/Extender: Suella Grove in Treatment: 0 Active Problems ICD-10 Encounter Code Description Active Date Diagnosis L89.311 Pressure ulcer of right buttock, stage 1 12/11/2015 Yes M48.56XA Collapsed vertebra, not elsewhere classified, lumbar 12/11/2015 Yes region, initial encounter for fracture Inactive Problems Resolved Problems Electronic Signature(s) Signed: 12/11/2015 2:59:41 PM By: Christin Fudge MD, FACS Previous Signature: 12/11/2015 2:54:04 PM Version By: Christin Fudge MD, FACS Entered By: Christin Fudge on [id number] Gerald [name] ([id number]) -------------------------------------------------------------------------------- Progress Note Details Gerald [name], Gerald [name] 0000000 2:15 Patient Name: Date of Service: [name]: [id number] Number: Treating RN: Montey Hora Date of Birth/Sex: [birth year] (82 y.o. Male) Other Clinician: Primary Care Physician: [name] Treating Christin Fudge Referring Physician: Era Bumpers Physician/Extender: Suella Grove in Treatment: 0 Subjective Chief Complaint Information obtained from Patient Patient is at the clinic for treatment of an open pressure ulcer in the region of the right gluteal area for about 2 weeks now History of Present Illness (HPI) The following HPI elements were documented for the patient's wound: Location: right gluteal area Quality: Patient reports experiencing a dull pain to affected area(s). Severity: Patient states wound are getting worse. Duration:  Patient has had the wound for < 2 weeks prior to presenting for treatment Timing: Pain in wound is Intermittent (comes and goes Context: The wound appeared gradually over time Modifying Factors: Other treatment(s) tried include:has recently been diagnosed with a L2 compression fracture and due to this he has a lot of pain Associated Signs and Symptoms: problems laying down in bed 82 year old gentleman who has the chief complaints of severe back pain and has been seen by his neurosurgeon Dr. Era Bumpers. He was referred to [place] for some skin in breakdown in his gluteal region due to constantly sitting in his wheelchair. He is also being treated for compression fracture of his lumbar spine Past medical history significant for atrial fibrillation, hypertension, prostate cancer, testicular cancer, status post open cholecystectomy, prostate surgery, hernia repair. He is a former smoker. Wound History Patient presents with 1 open wound that has been present for approximately 2 weeks. Patient has been treating wound in the following manner: gold bond powder. Laboratory tests have not been  performed in the last month. Patient reportedly has not tested positive for an antibiotic resistant organism. Patient reportedly has not tested positive for osteomyelitis. Patient reportedly has not had testing performed to evaluate circulation in the legs. Patient History Information obtained from Patient. Allergies Levaquin Gerald [name], Gerald [name] ([id number]) Family History Cancer - Mother, Hypertension - Mother, No family history of Diabetes, Heart Disease, Kidney Disease, Lung Disease, Seizures, Stroke, Thyroid Problems. Social History Former smoker - 76 + years, Marital Status - Widowed, Alcohol Use - Rarely, Drug Use - No History, Caffeine Use - Rarely. Medical History Eyes Patient has history of Cataracts - bilateral Denies history of Glaucoma, Optic Neuritis Ear/Nose/Mouth/Throat Denies history of  Chronic sinus problems/congestion, Middle ear problems Hematologic/Lymphatic Denies history of Anemia, Hemophilia, Human Immunodeficiency Virus, Lymphedema, Sickle Cell Disease Respiratory Denies history of Aspiration, Asthma, Chronic Obstructive Pulmonary Disease (COPD), Pneumothorax, Sleep Apnea, Tuberculosis Cardiovascular Patient has history of Arrhythmia Denies history of Angina, Congestive Heart Failure, Coronary Artery Disease, Deep Vein Thrombosis, Hypertension, Hypotension, Myocardial Infarction, Peripheral Arterial Disease, Peripheral Venous Disease, Phlebitis Gastrointestinal Denies history of Cirrhosis , Colitis, Crohn s, Hepatitis A, Hepatitis B, Hepatitis C Endocrine Denies history of Type I Diabetes, Type II Diabetes Genitourinary Denies history of End Stage Renal Disease Immunological Denies history of Lupus Erythematosus, Raynaud s, Scleroderma Integumentary (Skin) Patient has history of History of pressure wounds Denies history of History of Burn Musculoskeletal Patient has history of Osteoarthritis Denies history of Gout, Rheumatoid Arthritis, Osteomyelitis Neurologic Denies history of Dementia, Neuropathy, Quadriplegia, Paraplegia, Seizure Disorder Oncologic Denies history of Received Chemotherapy Psychiatric Denies history of Anorexia/bulimia, Confinement Anxiety Medical And Surgical History Notes Constitutional Symptoms (General Health) a-Fib; Prostate Cancer; cataracts; HTN; Mitral regurgitation, Testicular cancer; Cholecystectomy; hernia Gerald [name], Gerald [name] ([id number]) repair, spinal fracture Review of Systems (ROS) Constitutional Symptoms (General Health) The patient has no complaints or symptoms. Eyes The patient has no complaints or symptoms. Ear/Nose/Mouth/Throat The patient has no complaints or symptoms. Hematologic/Lymphatic The patient has no complaints or symptoms. Respiratory The patient has no complaints or  symptoms. Cardiovascular The patient has no complaints or symptoms. Gastrointestinal The patient has no complaints or symptoms. Endocrine The patient has no complaints or symptoms. Genitourinary The patient has no complaints or symptoms. Immunological The patient has no complaints or symptoms. Integumentary (Skin) Complains or has symptoms of Wounds. Denies complaints or symptoms of Bleeding or bruising tendency, Breakdown, Swelling. Musculoskeletal The patient has no complaints or symptoms. Neurologic The patient has no complaints or symptoms. Oncologic The patient has no complaints or symptoms, prostate and testicular Psychiatric The patient has no complaints or symptoms. Medications: aspirin 81 mg daily, Zestoretic 10-12.5 one tablet daily, naproxen sodium to 20 mg twice a day, tramadol 50 mg 1 tablet every 6 hours as needed, B complex vitamin, Folic Acid. Objective Constitutional Pulse regular. Respirations normal and unlabored. Afebrile. Vitals Time Taken: 2:19 PM, Height: 69 in, Weight: 150 lbs, BMI: 22.1, Temperature: 97.5 F, Pulse: 105 bpm, Respiratory Rate: 18 breaths/min, Blood Pressure: 134/78 mmHg. Gerald [name], Gerald [name] ([id number]) Eyes Nonicteric. Reactive to light. Ears, Nose, Mouth, and Throat Lips, teeth, and gums WNL.[name] Moist mucosa without lesions. Neck supple and nontender. No palpable supraclavicular or cervical adenopathy. Normal sized without goiter. Respiratory WNL. No retractions.. Cardiovascular Pedal Pulses WNL. No clubbing, cyanosis or edema. Gastrointestinal (GI) Abdomen without masses or tenderness.. No liver or spleen enlargement or tenderness.. Lymphatic No adneopathy. No adenopathy. No adenopathy. Musculoskeletal Adexa without tenderness or enlargement.. Digits and nails w/o clubbing, cyanosis, infection, petechiae, ischemia,  or inflammatory conditions.[name] Psychiatric Judgement and insight Intact.. No evidence of depression, anxiety, or  agitation.. General Notes: Stage 1 pressure injury to the right gluteal area just to the right of the midline and other than that is no evidence of cellulitis or deeper injury Integumentary (Hair, Skin) No suspicious lesions. No crepitus or fluctuance. No peri-wound warmth or erythema. No masses.. Wound #1 status is Open. Original cause of wound was Pressure Injury. The wound is located on the Right Gluteus. The wound measures 1cm length x 0.5cm width x 0.1cm depth; 0.393cm^2 area and 0.039cm^3 volume. The wound is limited to skin breakdown. There is no tunneling or undermining noted. There is a medium amount of serous drainage noted. The wound margin is flat and intact. There is large (67-100%) pink granulation within the wound bed. There is a small (1-33%) amount of necrotic tissue within the wound bed including Adherent Slough. The periwound skin appearance exhibited: Moist. The periwound skin appearance did not exhibit: Callus, Crepitus, Excoriation, Fluctuance, Friable, Induration, Localized Edema, Rash, Scarring, Dry/Scaly, Maceration, Atrophie Blanche, Cyanosis, Ecchymosis, Hemosiderin Staining, Mottled, Pallor, Rubor, Erythema. Assessment Gerald [name], Gerald [name] ([id number]) Active Problems ICD-10 L89.311 - Pressure ulcer of right buttock, stage 1 M48.56XA - Collapsed vertebra, not elsewhere classified, lumbar region, initial encounter for fracture A very pleasant 82 year old gentleman who has a stage I pressure injury to the right gluteal area due to constant sitting on a recliner for the last several weeks due to a lumbar spine fracture. After review I have recommended: 1. Prisma AG and aborted form to be applied daily or every other day as required. 2. Discussed offloading in great detail especially as he is sleeping in a recliner. 3. Adequate protein and vitamin supplements including vitamin A, vitamin C 4. Regular visits the wound center Plan Wound Cleansing: Wound #1 Right  Gluteus: Clean wound with Normal Saline. Primary Wound Dressing: Wound #1 Right Gluteus: Prisma Ag Secondary Dressing: Wound #1 Right Gluteus: Boardered Foam Dressing Dressing Change Frequency: Wound #1 Right Gluteus: Change dressing every other day. Follow-up Appointments: Wound #1 Right Gluteus: Return Appointment in 1 week. Off-Loading: Wound #1 Right Gluteus: Turn and reposition every 2 hours A very pleasant 82 year old gentleman who has a stage I pressure injury to the right gluteal area due to constant sitting on a recliner for the last several weeks due to a lumbar spine fracture. After review I have recommended: Gerald [name], ZAGORSKI ([id number]) 1. Prisma AG and aborted form to be applied daily or every other day as required. 2. Discussed offloading in great detail especially as he is sleeping in a recliner. 3. Adequate protein and vitamin supplements including vitamin A, vitamin C 4. Regular visits the wound center Electronic Signature(s) Signed: 12/11/2015 3:00:07 PM By: Christin Fudge MD, FACS Previous Signature: 12/11/2015 2:58:35 PM Version By: Christin Fudge MD, FACS Entered By: Christin Fudge on [id number] Gerald [name] ([id number]) -------------------------------------------------------------------------------- ROS/PFSH Details WOLFE, RENTZ 0000000 2:15 Patient Name: Date of Service: [name]: [id number] Number: Treating RN: Cornell Barman Date of Birth/Sex: 22-Sep-1920 (82 y.o. Male) Other Clinician: Primary Care Physician: [name] Treating Christin Fudge Referring Physician: Era Bumpers Physician/Extender: Suella Grove in Treatment: 0 Information Obtained From Patient Wound History Do you currently have one or more open woundso Yes How many open wounds do you currently haveo 1 Approximately how long have you had your woundso 2 weeks How have you been treating your wound(s) until nowo gold  bond powder Has your wound(s) ever  healed and then re-openedo No Have you had any lab work done in the past montho No Have you tested positive for an antibiotic resistant organism (MRSA, VRE)o No Have you tested positive for osteomyelitis (bone infection)o No Have you had any tests for circulation on your legso No Integumentary (Skin) Complaints and Symptoms: Positive for: Wounds Negative for: Bleeding or bruising tendency; Breakdown; Swelling Medical History: Positive for: History of pressure wounds Negative for: History of Burn Constitutional Symptoms (General Health) Complaints and Symptoms: No Complaints or Symptoms Medical History: Past Medical History Notes: a-Fib; Prostate Cancer; cataracts; HTN; Mitral regurgitation, Testicular cancer; Cholecystectomy; hernia repair, spinal fracture Eyes Complaints and Symptoms: No Complaints or Symptoms Medical History: Positive for: Cataracts - bilateral SALAHUDDIN, SALDUTTI ([id number]) Negative for: Glaucoma; Optic Neuritis Ear/Nose/Mouth/Throat Complaints and Symptoms: No Complaints or Symptoms Medical History: Negative for: Chronic sinus problems/congestion; Middle ear problems Hematologic/Lymphatic Complaints and Symptoms: No Complaints or Symptoms Medical History: Negative for: Anemia; Hemophilia; Human Immunodeficiency Virus; Lymphedema; Sickle Cell Disease Respiratory Complaints and Symptoms: No Complaints or Symptoms Medical History: Negative for: Aspiration; Asthma; Chronic Obstructive Pulmonary Disease (COPD); Pneumothorax; Sleep Apnea; Tuberculosis Cardiovascular Complaints and Symptoms: No Complaints or Symptoms Medical History: Positive for: Arrhythmia Negative for: Angina; Congestive Heart Failure; Coronary Artery Disease; Deep Vein Thrombosis; Hypertension; Hypotension; Myocardial Infarction; Peripheral Arterial Disease; Peripheral Venous Disease; Phlebitis Gastrointestinal Complaints and Symptoms: No  Complaints or Symptoms Medical History: Negative for: Cirrhosis ; Colitis; Crohnos; Hepatitis A; Hepatitis B; Hepatitis C Endocrine Complaints and Symptoms: No Complaints or Symptoms Medical History: JARAD, KINCHELOE ([id number]) Negative for: Type I Diabetes; Type II Diabetes Genitourinary Complaints and Symptoms: No Complaints or Symptoms Medical History: Negative for: End Stage Renal Disease Immunological Complaints and Symptoms: No Complaints or Symptoms Medical History: Negative for: Lupus Erythematosus; Raynaudos; Scleroderma Musculoskeletal Complaints and Symptoms: No Complaints or Symptoms Medical History: Positive for: Osteoarthritis Negative for: Gout; Rheumatoid Arthritis; Osteomyelitis Neurologic Complaints and Symptoms: No Complaints or Symptoms Medical History: Negative for: Dementia; Neuropathy; Quadriplegia; Paraplegia; Seizure Disorder Oncologic Complaints and Symptoms: No Complaints or Symptoms Complaints and Symptoms: Review of System Notes: prostate and testicular Medical History: Negative for: Received Chemotherapy Psychiatric Complaints and Symptoms: No Complaints or Symptoms ENSAR, AUNGST ([id number]) Medical History: Negative for: Anorexia/bulimia; Confinement Anxiety HBO Extended History Items Eyes: Cataracts Immunizations Pneumococcal Vaccine: Received Pneumococcal Vaccination: Yes Family and Social History Cancer: Yes - Mother; Diabetes: No; Heart Disease: No; Hypertension: Yes - Mother; Kidney Disease: No; Lung Disease: No; Seizures: No; Stroke: No; Thyroid Problems: No; Former smoker - 36 + years; Marital Status - Widowed; Alcohol Use: Rarely; Drug Use: No History; Caffeine Use: Rarely; Advanced Directives: Yes (Not Provided); Patient does not want information on Advanced Directives; Do not resuscitate: No; Living Will: Yes (Not Provided); Medical Power of Attorney: Yes (Not Provided) Physician Affirmation I have  reviewed and agree with the above information. Electronic Signature(s) Signed: 12/11/2015 3:04:46 PM By: Christin Fudge MD, FACS Signed: 12/11/2015 4:44:50 PM By: Gretta Cool RN, BSN, Kim RN, BSN Entered By: Christin Fudge on [id number] TOMASZ, FORNOFF ([id number]) -------------------------------------------------------------------------------- Commerce Details Patient Name: Gerald [name] Date of Service: 12/11/2015 Medical Record Number: [id number] Patient Account Number: [id number] Date of Birth/Sex: October 31, 1920 (82 y.o. Male) Treating RN: Montey Hora Primary Care Physician: [name] Other Clinician: Referring Physician: Era Bumpers Treating Physician/Extender: Frann Rider in Treatment: 0 Diagnosis Coding ICD-10 Codes Code Description L89.311 Pressure ulcer of right buttock, stage 1 M48.56XA Collapsed vertebra, not elsewhere classified, lumbar region, initial encounter for  fracture Facility Procedures CPT4 Code: [id number] Description: [phone] - WOUND CARE VISIT-LEV 3 EST PT Modifier: Quantity: 1 Physician Procedures CPT4: Description Modifier Quantity Code [id number] - WC PHYS LEVEL 4 - NEW PT 1 ICD-10 Description Diagnosis L89.311 Pressure ulcer of right buttock, stage 1 M48.56XA Collapsed vertebra, not elsewhere classified, lumbar region, initial encounter for  fracture Electronic Signature(s) Signed: 12/11/2015 3:00:22 PM By: Christin Fudge MD, FACS Entered By: Christin Fudge on 12/11/2015 15:00:21

## 2015-12-12 NOTE — Progress Notes (Signed)
AVIDAN, CLEVERINGA ([id number]) Visit Report for 12/11/2015 Abuse/Suicide Risk Screen Details HALDON, BARLET 0000000 2:15 Patient Name: Date of Service: [name]: [id number] Number: Treating RN: Cornell Barman Date of Birth/Sex: 06-20-1920 (82 y.o. Male) Other Clinician: Primary Care Physician: [name] Treating Christin Fudge Referring Physician: Era Bumpers Physician/Extender: Suella Grove in Treatment: 0 Abuse/Suicide Risk Screen Items Answer ABUSE/SUICIDE RISK SCREEN: Has anyone close to you tried to hurt or harm you recentlyo No Do you feel uncomfortable with anyone in your familyo No Has anyone forced you do things that you didnot want to doo No Do you have any thoughts of harming yourselfo No Patient displays signs or symptoms of abuse and/or neglect. No Electronic Signature(s) Signed: 12/11/2015 4:44:50 PM By: Gretta Cool, RN, BSN, Kim RN, BSN Entered By: Gretta Cool, RN, BSN, Kim on [id number] [name] ([id number]) -------------------------------------------------------------------------------- Activities of Daily Living Details ZAQUAN, WHISLER 0000000 2:15 Patient Name: Date of Service: [name]: [id number] Number: Treating RN: Cornell Barman Date of Birth/Sex: [birth year] (82 y.o. Male) Other Clinician: Primary Care Physician: [name] Treating Christin Fudge Referring Physician: Era Bumpers Physician/Extender: Suella Grove in Treatment: 0 Activities of Daily Living Items Answer Activities of Daily Living (Please select one for each item) Drive Automobile Completely Able Take Medications Completely Able Use Telephone Completely Able Care for Appearance Completely Able Use Toilet Completely Able Bath / Shower Completely Able Dress Self Completely Able Feed Self Completely Able Walk Completely Able Get In / Out Bed Completely Able Housework Completely  Able Prepare Meals Completely Able Handle Money Completely Able Shop for Self Completely Able Electronic Signature(s) Signed: 12/11/2015 4:44:50 PM By: Gretta Cool, RN, BSN, Kim RN, BSN Entered By: Gretta Cool, RN, BSN, Kim on [id number] VALENTE, KOKER ([id number]) -------------------------------------------------------------------------------- Education Assessment Details DAMETRIUS, HAGOPIAN 0000000 2:15 Patient Name: Date of Service: [name]: [id number] Number: Treating RN: Cornell Barman Date of Birth/Sex: 10-Sep-1920 (82 y.o. Male) Other Clinician: Primary Care Physician: [name] Treating Christin Fudge Referring Physician: Era Bumpers Physician/Extender: Suella Grove in Treatment: 0 Primary Learner Assessed: Patient Learning Preferences/Education Level/Primary Language Learning Preference: Explanation, Demonstration Highest Education Level: College or Above Preferred Language: English Cognitive Barrier Assessment/Beliefs Language Barrier: No Translator Needed: No Memory Deficit: No Emotional Barrier: No Cultural/Religious Beliefs Affecting Medical No Care: Physical Barrier Assessment Impaired Vision: No Impaired Hearing: No Decreased Hand dexterity: No Knowledge/Comprehension Assessment Knowledge Level: High Comprehension Level: High Ability to understand written High instructions: Ability to understand verbal High instructions: Motivation Assessment Anxiety Level: Calm Cooperation: Cooperative Education Importance: Acknowledges Need Interest in Health Problems: Asks Questions Perception: Coherent Willingness to Engage in Self- High Management Activities: Readiness to Engage in Self- High Management Activities: EWALD, DUCKER ([id number]) Electronic Signature(s) Signed: 12/11/2015 4:44:50 PM By: Gretta Cool, RN, BSN, Kim RN, BSN Entered By: Gretta Cool, RN, BSN, Kim on [id number] KRAIG, BONDI  ([id number]) -------------------------------------------------------------------------------- Fall Risk Assessment Details HUMBERT, LOWE 0000000 2:15 Patient Name: Date of Service: [name]: [id number] Number: Treating RN: Cornell Barman Date of Birth/Sex: May 08, 1920 (82 y.o. Male) Other Clinician: Primary Care Physician: [name] Treating Christin Fudge Referring Physician: Era Bumpers Physician/Extender: Suella Grove in Treatment: 0 Fall Risk Assessment Items Have you had 2 or more falls in the last 12 monthso 0 No Have you had any fall that resulted in injury in the last 12 monthso 0 No FALL RISK ASSESSMENT: History of falling - immediate or within  3 months 0 No Secondary diagnosis 0 No Ambulatory aid None/bed rest/wheelchair/nurse 0 No Crutches/cane/walker 15 Yes Furniture 0 No IV Access/Saline Lock 0 No Gait/Training Normal/bed rest/immobile 0 Yes Weak 0 No Impaired 0 No Mental Status Oriented to own ability 0 Yes Electronic Signature(s) Signed: 12/11/2015 4:44:50 PM By: Gretta Cool, RN, BSN, Kim RN, BSN Entered By: Gretta Cool, RN, BSN, Kim on [id number] [phone] [name] ([id number]) -------------------------------------------------------------------------------- Nutrition Risk Assessment Details ABDULHADI, HAMILL 0000000 2:15 Patient Name: Date of Service: [name]: [id number] Number: Treating RN: Cornell Barman Date of Birth/Sex: 10-29-1920 (82 y.o. Male) Other Clinician: Primary Care Physician: [name] Treating Christin Fudge Referring Physician: Era Bumpers Physician/Extender: Suella Grove in Treatment: 0 Height (in): 69 Weight (lbs): 150 Body Mass Index (BMI): 22.1 Nutrition Risk Assessment Items NUTRITION RISK SCREEN: I have an illness or condition that made me change the kind and/or 0 No amount of food I eat I eat fewer than two meals per day 0 No I eat few  fruits and vegetables, or milk products 0 No I have three or more drinks of beer, liquor or wine almost every day 0 No I have tooth or mouth problems that make it hard for me to eat 0 No I don't always have enough money to buy the food I need 0 No I eat alone most of the time 0 No I take three or more different prescribed or over-the-counter drugs a 0 No day Without wanting to, I have lost or gained 10 pounds in the last six 0 No months I am not always physically able to shop, cook and/or feed myself 0 No Nutrition Protocols Good Risk Protocol 0 No interventions needed Moderate Risk Protocol Electronic Signature(s) Signed: 12/11/2015 4:44:50 PM By: Gretta Cool, RN, BSN, Kim RN, BSN Entered By: Gretta Cool, RN, BSN, Kim on 12/11/2015 14:30:19

## 2015-12-12 NOTE — Progress Notes (Signed)
RAMEY, BYUN ([id number]) Visit Report for 12/11/2015 Allergy List Details Patient Name: Gerald [name], Gerald [name]. Date of Service: 12/11/2015 2:15 PM Medical Record Number: [id number] Patient Account Number: [id number] Date of Birth/Sex: December 09, 1920 (82 y.o. Male) Treating RN: Cornell Barman Primary Care Physician: [name] Other Clinician: Referring Physician: Era Bumpers Treating Physician/Extender: Frann Rider in Treatment: 0 Allergies Active Allergies Levaquin Allergy Notes Electronic Signature(s) Signed: 12/11/2015 4:44:50 PM By: Gretta Cool, RN, BSN, Kim RN, BSN Entered By: Gretta Cool, RN, BSN, Kim on [id number] Gerald [name] ([id number]) -------------------------------------------------------------------------------- Arrival Information Details Patient Name: Gerald [name] Date of Service: 12/11/2015 2:15 PM Medical Record Number: [id number] Patient Account Number: [id number] Date of Birth/Sex: 1920-09-18 (82 y.o. Male) Treating RN: Cornell Barman Primary Care Physician: [name] Other Clinician: Referring Physician: Era Bumpers Treating Physician/Extender: Frann Rider in Treatment: 0 Visit Information Patient Arrived: Charlyn Minerva Time: 14:18 Accompanied By: daughter Transfer Assistance: None Patient Identification Verified: Yes Secondary Verification Process Yes Completed: Patient Requires Transmission- No Based Precautions: Patient Has Alerts: Yes Patient Alerts: Patient on Blood Thinner 81 mg aspirin NOT Diabetic Electronic Signature(s) Signed: 12/11/2015 4:44:50 PM By: Gretta Cool, RN, BSN, Kim RN, BSN Entered By: Gretta Cool, RN, BSN, Kim on [id number] Gerald [name] ([id number]) -------------------------------------------------------------------------------- Clinic Level of Care Assessment Details Patient Name: Gerald [name], Gerald [name]. Date of Service: 12/11/2015 2:15 PM Medical Record Number: [id number] Patient Account  Number: [id number] Date of Birth/Sex: 20-Mar-1920 (82 y.o. Male) Treating RN: Cornell Barman Primary Care Physician: [name] Other Clinician: Referring Physician: Era Bumpers Treating Physician/Extender: Frann Rider in Treatment: 0 Clinic Level of Care Assessment Items TOOL 2 Quantity Score []  - Use when only an EandM is performed on the INITIAL visit 0 ASSESSMENTS - Nursing Assessment / Reassessment X - General Physical Exam (combine w/ comprehensive assessment (listed just 1 20 below) when performed on new pt. evals) X - Comprehensive Assessment (HX, ROS, Risk Assessments, Wounds Hx, etc.) 1 25 ASSESSMENTS - Wound and Skin Assessment / Reassessment X - Simple Wound Assessment / Reassessment - one wound 1 5 []  - Complex Wound Assessment / Reassessment - multiple wounds 0 []  - Dermatologic / Skin Assessment (not related to wound area) 0 ASSESSMENTS - Ostomy and/or Continence Assessment and Care []  - Incontinence Assessment and Management 0 []  - Ostomy Care Assessment and Management (repouching, etc.) 0 PROCESS - Coordination of Care X - Simple Patient / Family Education for ongoing care 1 15 []  - Complex (extensive) Patient / Family Education for ongoing care 0 X - Staff obtains Programmer, systems, Records, Test Results / Process Orders 1 10 []  - Staff telephones HHA, Nursing Homes / Clarify orders / etc 0 []  - Routine Transfer to another Facility (non-emergent condition) 0 []  - Routine Hospital Admission (non-emergent condition) 0 []  - New Admissions / Biomedical engineer / Ordering NPWT, Apligraf, etc. 0 []  - Emergency Hospital Admission (emergent condition) 0 X - Simple Discharge Coordination 1 10 Gerald [name], Gerald [name] ([id number]) []  - Complex (extensive) Discharge Coordination 0 PROCESS - Special Needs []  - Pediatric / Minor Patient Management 0 []  - Isolation Patient Management 0 []  - Hearing / Language / Visual special needs 0 []  - Assessment of Community assistance  (transportation, D/C planning, etc.) 0 []  - Additional assistance / Altered mentation 0 []  - Support Surface(s) Assessment (bed, cushion, seat, etc.) 0 INTERVENTIONS - Wound Cleansing / Measurement X - Wound Imaging (photographs - any number of wounds) 1 5 []  - Wound Tracing (instead of photographs) 0 X -  Simple Wound Measurement - one wound 1 5 []  - Complex Wound Measurement - multiple wounds 0 X - Simple Wound Cleansing - one wound 1 5 []  - Complex Wound Cleansing - multiple wounds 0 INTERVENTIONS - Wound Dressings X - Small Wound Dressing one or multiple wounds 1 10 []  - Medium Wound Dressing one or multiple wounds 0 []  - Large Wound Dressing one or multiple wounds 0 []  - Application of Medications - injection 0 INTERVENTIONS - Miscellaneous []  - External ear exam 0 []  - Specimen Collection (cultures, biopsies, blood, body fluids, etc.) 0 []  - Specimen(s) / Culture(s) sent or taken to Lab for analysis 0 []  - Patient Transfer (multiple staff / Civil Service fast streamer / Similar devices) 0 []  - Simple Staple / Suture removal (25 or less) 0 []  - Complex Staple / Suture removal (26 or more) 0 Gerald [name], MCQUOWN. ([id number]) []  - Hypo / Hyperglycemic Management (close monitor of Blood Glucose) 0 []  - Ankle / Brachial Index (ABI) - do not check if billed separately 0 Has the patient been seen at the hospital within the last three years: Yes Total Score: 110 Level Of Care: New/Established - Level 3 Electronic Signature(s) Signed: 12/11/2015 4:44:50 PM By: Gretta Cool, RN, BSN, Kim RN, BSN Entered By: Gretta Cool, RN, BSN, Kim on [id number] Gerald [name] ([id number]) -------------------------------------------------------------------------------- Encounter Discharge Information Details Patient Name: Gerald [name] Date of Service: 12/11/2015 2:15 PM Medical Record Number: [id number] Patient Account Number: [id number] Date of Birth/Sex: 07-11-1920 (82 y.o. Male) Treating RN: Cornell Barman Primary Care Physician: [name] Other Clinician: Referring Physician: Era Bumpers Treating Physician/Extender: Frann Rider in Treatment: 0 Encounter Discharge Information Items Discharge Pain Level: 0 Discharge Condition: Stable Ambulatory Status: Ambulatory Discharge Destination: Home Transportation: Private Auto Accompanied By: daughter Schedule Follow-up Appointment: Yes Medication Reconciliation completed and provided to Patient/Care Yes Gerald [name]: Provided on Clinical Summary of Care: 12/11/2015 Form Type Recipient Paper Patient GP Electronic Signature(s) Signed: 12/11/2015 2:57:04 PM By: Ruthine Dose Entered By: Ruthine Dose on [id number] Gerald [name] ([id number]) -------------------------------------------------------------------------------- Multi Wound Chart Details Patient Name: Gerald [name] Date of Service: 12/11/2015 2:15 PM Medical Record Number: [id number] Patient Account Number: [id number] Date of Birth/Sex: 1920/12/01 (82 y.o. Male) Treating RN: Cornell Barman Primary Care Physician: [name] Other Clinician: Referring Physician: Era Bumpers Treating Physician/Extender: Frann Rider in Treatment: 0 Vital Signs Height(in): 69 Pulse(bpm): 105 Weight(lbs): 150 Blood Pressure 134/78 (mmHg): Body Mass Index(BMI): 22 Temperature(F): 97.5 Respiratory Rate 18 (breaths/min): Photos: [1:No Photos] [N/A:N/A] Wound Location: [1:Right Gluteus] [N/A:N/A] Wounding Event: [1:Pressure Injury] [N/A:N/A] Primary Etiology: [1:Pressure Ulcer] [N/A:N/A] Comorbid History: [1:Cataracts, Arrhythmia, History of pressure wounds, Osteoarthritis] [N/A:N/A] Date Acquired: [1:11/27/2015] [N/A:N/A] Weeks of Treatment: [1:0] [N/A:N/A] Wound Status: [1:Open] [N/A:N/A] Measurements L x W x D 1x0.5x0.1 [N/A:N/A] (cm) Area (cm) : [1:0.393] [N/A:N/A] Volume (cm) : [1:0.039] [N/A:N/A] Classification: [1:Category/Stage II]  [N/A:N/A] Exudate Amount: [1:None Present] [N/A:N/A] Wound Margin: [1:Flat and Intact] [N/A:N/A] Granulation Amount: [1:Large (67-100%)] [N/A:N/A] Granulation Quality: [1:Pink] [N/A:N/A] Necrotic Amount: [1:Small (1-33%)] [N/A:N/A] Exposed Structures: [1:Fascia: No Fat: No Tendon: No Muscle: No Joint: No Bone: No Limited to Skin Breakdown] [N/A:N/A] Epithelialization: [1:None] [N/A:N/A] Periwound Skin Texture: [N/A:N/A] Edema: No Excoriation: No Induration: No Callus: No Crepitus: No Fluctuance: No Friable: No Rash: No Scarring: No Periwound Skin Moist: Yes N/A N/A Moisture: Maceration: No Dry/Scaly: No Periwound Skin Color: Atrophie Blanche: No N/A N/A Cyanosis: No Ecchymosis: No Erythema: No Hemosiderin Staining: No Mottled: No Pallor: No Rubor: No Tenderness on No  N/A N/A Palpation: Wound Preparation: Ulcer Cleansing: N/A N/A Rinsed/Irrigated with Saline Topical Anesthetic Applied: None Treatment Notes Electronic Signature(s) Signed: 12/11/2015 4:44:50 PM By: Gretta Cool, RN, BSN, Kim RN, BSN Entered By: Gretta Cool, RN, BSN, Kim on [id number] [phone] Gerald [name] ([id number]) -------------------------------------------------------------------------------- [place] Details Patient Name: Gerald [name], DEHN. Date of Service: 12/11/2015 2:15 PM Medical Record Number: [id number] Patient Account Number: [id number] Date of Birth/Sex: [birth year] (82 y.o. Male) Treating RN: Cornell Barman Primary Care Physician: [name] Other Clinician: Referring Physician: Era Bumpers Treating Physician/Extender: Frann Rider in Treatment: 0 Active Inactive Orientation to the Wound Care Program Nursing Diagnoses: Knowledge deficit related to the wound healing center program Goals: Patient/caregiver will verbalize understanding of the [facility] Program Date Initiated: 12/11/2015 Goal Status: Active Interventions: Provide education on  orientation to the wound center Notes: Pressure Nursing Diagnoses: Knowledge deficit related to causes and risk factors for pressure ulcer development Goals: Patient will remain free of pressure ulcers Date Initiated: 12/11/2015 Goal Status: Active Interventions: Assess: immobility, friction, shearing, incontinence upon admission and as needed Notes: Wound/Skin Impairment Nursing Diagnoses: Impaired tissue integrity Goals: Ulcer/skin breakdown will have a volume reduction of 30% by week 4 Date Initiated: 0000000 Gerald [name], Gerald [name] ([id number]) Goal Status: Active Interventions: Assess patient/caregiver ability to obtain necessary supplies Notes: Electronic Signature(s) Signed: 12/11/2015 4:44:50 PM By: Gretta Cool, RN, BSN, Kim RN, BSN Entered By: Gretta Cool, RN, BSN, Kim on [id number] Gerald [name] ([id number]) -------------------------------------------------------------------------------- Pain Assessment Details Patient Name: Gerald [name] Date of Service: 12/11/2015 2:15 PM Medical Record Number: [id number] Patient Account Number: [id number] Date of Birth/Sex: Jun 26, 1920 (82 y.o. Male) Treating RN: Cornell Barman Primary Care Physician: [name] Other Clinician: Referring Physician: Era Bumpers Treating Physician/Extender: Frann Rider in Treatment: 0 Active Problems Location of Pain Severity and Description of Pain Patient Has Paino No Site Locations With Dressing Change: No Pain Management and Medication Current Pain Management: Electronic Signature(s) Signed: 12/11/2015 4:44:50 PM By: Gretta Cool, RN, BSN, Kim RN, BSN Entered By: Gretta Cool, RN, BSN, Kim on [id number] Gerald [name] ([id number]) -------------------------------------------------------------------------------- Patient/Caregiver Education Details Patient Name: Gerald [name] Date of Service: 12/11/2015 2:15 PM Medical Record Number: [id number] Patient Account  Number: [id number] Date of Birth/Gender: 1921/03/07 (82 y.o. Male) Treating RN: Cornell Barman Primary Care Physician: [name] Other Clinician: Referring Physician: Era Bumpers Treating Physician/Extender: Frann Rider in Treatment: 0 Education Assessment Education Provided To: Patient Education Topics Provided Wound/Skin Impairment: Handouts: Caring for Your Ulcer Methods: Demonstration Responses: State content correctly Electronic Signature(s) Signed: 12/11/2015 4:44:50 PM By: Gretta Cool, RN, BSN, Kim RN, BSN Entered By: Gretta Cool, RN, BSN, Kim on [id number] [phone] Gerald [name] ([id number]) -------------------------------------------------------------------------------- Wound Assessment Details Patient Name: Gerald [name] Date of Service: 12/11/2015 2:15 PM Medical Record Number: [id number] Patient Account Number: [id number] Date of Birth/Sex: 11/03/1920 (82 y.o. Male) Treating RN: Cornell Barman Primary Care Physician: [name] Other Clinician: Referring Physician: Era Bumpers Treating Physician/Extender: Frann Rider in Treatment: 0 Wound Status Wound Number: 1 Primary Pressure Ulcer Etiology: Wound Location: Right Gluteus Wound Open Wounding Event: Pressure Injury Status: Date Acquired: 11/27/2015 Comorbid Cataracts, Arrhythmia, History of Weeks Of Treatment: 0 History: pressure wounds, Osteoarthritis Clustered Wound: No Wound Measurements Length: (cm) 1 Width: (cm) 0.5 Depth: (cm) 0.1 Area: (cm) 0.393 Volume: (cm) 0.039 % Reduction in Area: 0% % Reduction in Volume: 0% Epithelialization: None Tunneling: No Undermining: No Wound Description Classification: Category/Stage II Foul Odor Afte Wound Margin: Flat and Intact Exudate Amount: Medium  Exudate Type: Serous Exudate Color: amber r Cleansing: No Wound Bed Granulation Amount: Large (67-100%) Exposed Structure Granulation Quality: Pink Fascia Exposed: No Necrotic Amount:  Small (1-33%) Fat Layer Exposed: No Necrotic Quality: Adherent Slough Tendon Exposed: No Muscle Exposed: No Joint Exposed: No Bone Exposed: No Limited to Skin Breakdown Periwound Skin Texture Texture Color No Abnormalities Noted: No No Abnormalities Noted: No Callus: No Atrophie Blanche: No Crepitus: No Cyanosis: No Excoriation: No Ecchymosis: No Fluctuance: No Erythema: No Gerald [name], Gerald [name] ([id number]) Friable: No Hemosiderin Staining: No Induration: No Mottled: No Localized Edema: No Pallor: No Rash: No Rubor: No Scarring: No Moisture No Abnormalities Noted: No Dry / Scaly: No Maceration: No Moist: Yes Wound Preparation Ulcer Cleansing: Rinsed/Irrigated with Saline Topical Anesthetic Applied: None Treatment Notes Wound #1 (Right Gluteus) 1. Cleansed with: Clean wound with Normal Saline 4. Dressing Applied: Prisma Ag 5. Secondary Dressing Applied Bordered Foam Dressing Electronic Signature(s) Signed: 12/11/2015 4:44:50 PM By: Gretta Cool, RN, BSN, Kim RN, BSN Entered By: Gretta Cool, RN, BSN, Kim on [id number] [phone] Gerald [name] ([id number]) -------------------------------------------------------------------------------- [place] Details Patient Name: Gerald [name] Date of Service: 12/11/2015 2:15 PM Medical Record Number: [id number] Patient Account Number: [id number] Date of Birth/Sex: 27-Jun-1920 (82 y.o. Male) Treating RN: Cornell Barman Primary Care Physician: [name] Other Clinician: Referring Physician: Era Bumpers Treating Physician/Extender: Frann Rider in Treatment: 0 Vital Signs Time Taken: 14:19 Temperature (F): 97.5 Height (in): 69 Pulse (bpm): 105 Weight (lbs): 150 Respiratory Rate (breaths/min): 18 Body Mass Index (BMI): 22.1 Blood Pressure (mmHg): 134/78 Reference Range: 80 - 120 mg / dl Electronic Signature(s) Signed: 12/11/2015 4:44:50 PM By: Gretta Cool, RN, BSN, Kim RN, BSN Entered By: Gretta Cool, RN, BSN, Kim on  12/11/2015 14:20:07

## 2015-12-14 ENCOUNTER — Ambulatory Visit
Admission: RE | Admit: 2015-12-14 | Discharge: 2015-12-14 | Disposition: A | Discharge status: Home / Self Care | Payer: Medicare Other | Source: Ambulatory Visit | Attending: Orthopedic Surgery | Admitting: Orthopedic Surgery

## 2015-12-14 ENCOUNTER — Ambulatory Visit: Payer: Medicare Other

## 2015-12-14 ENCOUNTER — Encounter
Admission: RE | Disposition: A | Discharge status: Home / Self Care | Payer: Self-pay | Source: Ambulatory Visit | Attending: Orthopedic Surgery

## 2015-12-14 ENCOUNTER — Ambulatory Visit: Payer: Medicare Other | Admitting: Anesthesiology

## 2015-12-14 DIAGNOSIS — Z9049 Acquired absence of other specified parts of digestive tract: Secondary | ICD-10-CM | POA: Insufficient documentation

## 2015-12-14 DIAGNOSIS — Z801 Family history of malignant neoplasm of trachea, bronchus and lung: Secondary | ICD-10-CM | POA: Insufficient documentation

## 2015-12-14 DIAGNOSIS — X500XXA Overexertion from strenuous movement or load, initial encounter: Secondary | ICD-10-CM | POA: Insufficient documentation

## 2015-12-14 DIAGNOSIS — I340 Nonrheumatic mitral (valve) insufficiency: Secondary | ICD-10-CM | POA: Diagnosis not present

## 2015-12-14 DIAGNOSIS — Z419 Encounter for procedure for purposes other than remedying health state, unspecified: Secondary | ICD-10-CM

## 2015-12-14 DIAGNOSIS — Y9389 Activity, other specified: Secondary | ICD-10-CM | POA: Insufficient documentation

## 2015-12-14 DIAGNOSIS — I10 Essential (primary) hypertension: Secondary | ICD-10-CM | POA: Insufficient documentation

## 2015-12-14 DIAGNOSIS — S32020A Wedge compression fracture of second lumbar vertebra, initial encounter for closed fracture: Secondary | ICD-10-CM | POA: Insufficient documentation

## 2015-12-14 DIAGNOSIS — I4892 Unspecified atrial flutter: Secondary | ICD-10-CM | POA: Insufficient documentation

## 2015-12-14 DIAGNOSIS — Z79899 Other long term (current) drug therapy: Secondary | ICD-10-CM | POA: Diagnosis not present

## 2015-12-14 DIAGNOSIS — Z8249 Family history of ischemic heart disease and other diseases of the circulatory system: Secondary | ICD-10-CM | POA: Insufficient documentation

## 2015-12-14 DIAGNOSIS — D649 Anemia, unspecified: Secondary | ICD-10-CM | POA: Insufficient documentation

## 2015-12-14 DIAGNOSIS — Z8546 Personal history of malignant neoplasm of prostate: Secondary | ICD-10-CM | POA: Diagnosis not present

## 2015-12-14 DIAGNOSIS — Z888 Allergy status to other drugs, medicaments and biological substances status: Secondary | ICD-10-CM | POA: Insufficient documentation

## 2015-12-14 DIAGNOSIS — M1990 Unspecified osteoarthritis, unspecified site: Secondary | ICD-10-CM | POA: Insufficient documentation

## 2015-12-14 DIAGNOSIS — Z7982 Long term (current) use of aspirin: Secondary | ICD-10-CM | POA: Diagnosis not present

## 2015-12-14 DIAGNOSIS — I4891 Unspecified atrial fibrillation: Secondary | ICD-10-CM | POA: Diagnosis not present

## 2015-12-14 DIAGNOSIS — M4856XA Collapsed vertebra, not elsewhere classified, lumbar region, initial encounter for fracture: Secondary | ICD-10-CM | POA: Diagnosis present

## 2015-12-14 DIAGNOSIS — Z8547 Personal history of malignant neoplasm of testis: Secondary | ICD-10-CM | POA: Diagnosis not present

## 2015-12-14 DIAGNOSIS — Z808 Family history of malignant neoplasm of other organs or systems: Secondary | ICD-10-CM | POA: Diagnosis not present

## 2015-12-14 HISTORY — PX: KYPHOPLASTY: SHX5884

## 2015-12-14 SURGERY — KYPHOPLASTY
Anesthesia: Monitor Anesthesia Care

## 2015-12-14 MED ORDER — IOPAMIDOL (ISOVUE-M 200) INJECTION 41%
INTRAMUSCULAR | Status: DC | PRN
  Administered 2015-12-14: 10 mL

## 2015-12-14 MED ORDER — ONDANSETRON HCL 4 MG/2ML IJ SOLN: 4.0000 mg | Freq: Once | INTRAMUSCULAR | Status: DC | PRN

## 2015-12-14 MED ORDER — CEFAZOLIN SODIUM-DEXTROSE 2-4 GM/100ML-% IV SOLN
INTRAVENOUS | Status: AC
  Filled 2015-12-14: qty 100

## 2015-12-14 MED ORDER — BUPIVACAINE-EPINEPHRINE (PF) 0.5% -1:200000 IJ SOLN
INTRAMUSCULAR | Status: AC
  Filled 2015-12-14: qty 30

## 2015-12-14 MED ORDER — LIDOCAINE HCL (PF) 1 % IJ SOLN
INTRAMUSCULAR | Status: AC
  Filled 2015-12-14: qty 30

## 2015-12-14 MED ORDER — BUPIVACAINE-EPINEPHRINE (PF) 0.5% -1:200000 IJ SOLN
INTRAMUSCULAR | Status: DC | PRN
  Administered 2015-12-14: 10 mL via PERINEURAL

## 2015-12-14 MED ORDER — KETAMINE HCL 50 MG/ML IJ SOLN
INTRAMUSCULAR | Status: DC | PRN
  Administered 2015-12-14 (×2): 25 mg via INTRAMUSCULAR

## 2015-12-14 MED ORDER — FENTANYL CITRATE (PF) 100 MCG/2ML IJ SOLN: 25.0000 ug | INTRAMUSCULAR | Status: DC | PRN

## 2015-12-14 MED ORDER — MIDAZOLAM HCL 2 MG/2ML IJ SOLN
INTRAMUSCULAR | Status: DC | PRN
  Administered 2015-12-14: 1 mg via INTRAVENOUS

## 2015-12-14 MED ORDER — LIDOCAINE HCL 1 % IJ SOLN
INTRAMUSCULAR | Status: DC | PRN
  Administered 2015-12-14 (×2): 10 mL

## 2015-12-14 MED ORDER — LACTATED RINGERS IV SOLN
INTRAVENOUS | Status: DC
  Administered 2015-12-14: 11:00:00 via INTRAVENOUS

## 2015-12-14 MED ORDER — HYDROCODONE-ACETAMINOPHEN 5-325 MG PO TABS: 1.0000 | ORAL_TABLET | Freq: Four times a day (QID) | ORAL | 0 refills | Status: DC | PRN

## 2015-12-14 MED ORDER — FAMOTIDINE 20 MG PO TABS
ORAL_TABLET | ORAL | Status: AC
  Administered 2015-12-14: 20 mg
  Filled 2015-12-14: qty 1

## 2015-12-14 MED ORDER — IOPAMIDOL (ISOVUE-M 200) INJECTION 41%
INTRAMUSCULAR | Status: AC
  Filled 2015-12-14: qty 20

## 2015-12-14 SURGICAL SUPPLY — 19 items
CEMENT KYPHON CX01A KIT/MIXER (Cement) ×3 IMPLANT
DEVICE BIOPSY BONE KYPHX (INSTRUMENTS) ×3 IMPLANT
DRAPE C-ARM XRAY 36X54 (DRAPES) ×3 IMPLANT
DURAPREP 26ML APPLICATOR (WOUND CARE) ×3 IMPLANT
GLOVE BIO SURGEON STRL SZ 6.5 (GLOVE) ×2 IMPLANT
GLOVE BIO SURGEONS STRL SZ 6.5 (GLOVE) ×1
GLOVE BIOGEL PI IND STRL 7.0 (GLOVE) ×1 IMPLANT
GLOVE BIOGEL PI INDICATOR 7.0 (GLOVE) ×2
GLOVE SURG SYN 9.0  PF PI (GLOVE) ×2
GLOVE SURG SYN 9.0 PF PI (GLOVE) ×1 IMPLANT
GOWN SRG 2XL LVL 4 RGLN SLV (GOWNS) ×1 IMPLANT
GOWN STRL NON-REIN 2XL LVL4 (GOWNS) ×2
GOWN STRL REUS W/ TWL LRG LVL3 (GOWN DISPOSABLE) ×1 IMPLANT
GOWN STRL REUS W/TWL LRG LVL3 (GOWN DISPOSABLE) ×2
LIQUID BAND (GAUZE/BANDAGES/DRESSINGS) ×3 IMPLANT
PACK KYPHOPLASTY (MISCELLANEOUS) ×3 IMPLANT
STRAP SAFETY BODY (MISCELLANEOUS) ×3 IMPLANT
TRAY KYPHOPAK 15/3 EXPRESS 1ST (MISCELLANEOUS) ×3 IMPLANT
TRAY KYPHOPAK 20/3 EXPRESS 1ST (MISCELLANEOUS) IMPLANT

## 2015-12-14 NOTE — Op Note (Signed)
12/14/2015  4:09 PM  PATIENT:  Gerald [name]  82 y.o. male  PRE-OPERATIVE DIAGNOSIS:    compression fracture L2  POST-OPERATIVE DIAGNOSIS:  compression fracture L2  PROCEDURE:  Procedure(s): KYPHOPLASTY  L2 (N/A)  SURGEON: Laurene Footman, MD  ASSISTANTS: None  ANESTHESIA:   local and MAC  EBL:  Total I/O In: 600 [I.V.:600] Out: -   BLOOD ADMINISTERED:none  DRAINS: none   LOCAL MEDICATIONS USED:  MARCAINE    and LIDOCAINE   SPECIMEN:  Source of Specimen:  L2  DISPOSITION OF SPECIMEN:  PATHOLOGY  COUNTS:  YES  TOURNIQUET:  * No tourniquets in log *  IMPLANTS: bone cement  DICTATION: .Dragon Dictation patient brought the operating room and after adequate sedation was given, patient was placed prone. C-arm was brought in and good visualization of the L2 vertebral body was obtained in both AP and lateral projections. After patient identification timeout procedure 10 cc of 1% Xylocaine was infiltrated in the skin. Next the back was prepped and draped in sterile manner and repeat timeout procedure carried out. Spinal needle was then used to get down to the pedicle and a 50-50 mix of 1% Xylocaine and half percent Sensorcaine with epinephrine injected. After allowing this to set a small incision was made and a trocar was advanced in an extra pedicular fashion into the vertebral body. Care was taken not to enter the neural foramen or the spinal canal based on radiographic views. Biopsy was obtained and then drilling carried out followed by inflation of a balloon to about 2 cc. After the cement was mixed to the appropriate consistency approximately 3 cc of bone cement was used to fill the vertebral body getting good interdigitation.. The cement had set the trochars removed and permanent views were obtained. Wound was closed with Dermabond followed by a Band-Aid  PLAN OF CARE: Discharge to home after PACU  PATIENT DISPOSITION:  PACU - hemodynamically stable.

## 2015-12-14 NOTE — Discharge Instructions (Signed)
AMBULATORY SURGERY  DISCHARGE INSTRUCTIONS   1) The drugs that you were given will stay in your system until tomorrow so for the next 24 hours you should not:  A) Drive an automobile B) Make any legal decisions C) Drink any alcoholic beverage   2) You may resume regular meals tomorrow.  Today it is better to start with liquids and gradually work up to solid foods.  You may eat anything you prefer, but it is better to start with liquids, then soup and crackers, and gradually work up to solid foods.   3) Please notify your doctor immediately if you have any unusual bleeding, trouble breathing, redness and pain at the surgery site, drainage, fever, or pain not relieved by medication.    4) Additional Instructions:  Minimize activities today and tomorrow. Remove Band-Aid on Saturday and okay to shower at that point. Normal activity starting Saturday. Pain medicine as directed      Please contact your physician with any problems or Same Day Surgery at [phone], Monday through Friday 6 am to 4 pm, or [place] at [facility] number at [phone].

## 2015-12-14 NOTE — H&P (Signed)
Reviewed paper H+P, will be scanned into chart. No changes noted.  

## 2015-12-14 NOTE — Anesthesia Preprocedure Evaluation (Signed)
Anesthesia Evaluation  Patient identified by MRN, date of birth, ID band Patient awake    Reviewed: Allergy & Precautions, NPO status , Patient's Chart, lab work & pertinent test results  History of Anesthesia Complications Negative for: history of anesthetic complications  Airway Mallampati: II       Dental   Pulmonary neg pulmonary ROS, former smoker,           Cardiovascular hypertension, Pt. on medications and Pt. on home beta blockers + dysrhythmias Atrial Fibrillation + Valvular Problems/Murmurs (Mitral incompetance)      Neuro/Psych negative neurological ROS     GI/Hepatic negative GI ROS, Neg liver ROS,   Endo/Other  negative endocrine ROS  Renal/GU negative Renal ROS     Musculoskeletal  (+) Arthritis , Osteoarthritis,    Abdominal   Peds  Hematology  (+) anemia ,   Anesthesia Other Findings   Reproductive/Obstetrics                             Anesthesia Physical Anesthesia Plan  ASA: III  Anesthesia Plan: General   Post-op Pain Management:    Induction: Intravenous  Airway Management Planned: Nasal Cannula  Additional Equipment:   Intra-op Plan:   Post-operative Plan:   Informed Consent: I have reviewed the patients History and Physical, chart, labs and discussed the procedure including the risks, benefits and alternatives for the proposed anesthesia with the patient or authorized representative who has indicated his/her understanding and acceptance.     Plan Discussed with:   Anesthesia Plan Comments:         Anesthesia Quick Evaluation

## 2015-12-14 NOTE — Transfer of Care (Signed)
Immediate Anesthesia Transfer of Care Note  Patient: Gerald [name]  Procedure(s) Performed: Procedure(s): KYPHOPLASTY  L2 (N/A)  Patient Location: PACU  Anesthesia Type:MAC  Level of Consciousness: awake, alert  and oriented  Airway & Oxygen Therapy: Patient Spontanous Breathing  Post-op Assessment: Report given to RN and Post -op Vital signs reviewed and stable  Post vital signs: Reviewed and stable  Last Vitals:  Vitals:   12/14/15 1032 12/14/15 1603  BP: 122/78 (!) 152/89  Pulse: 100 97  Resp: 16 13  Temp: 36.4 C 36.7 C    Last Pain:  Vitals:   12/14/15 1032  TempSrc: Oral         Complications: No apparent anesthesia complications

## 2015-12-15 ENCOUNTER — Encounter: Payer: Self-pay | Admitting: Orthopedic Surgery

## 2015-12-15 NOTE — Anesthesia Postprocedure Evaluation (Signed)
Anesthesia Post Note  Patient: Gerald [name]  Procedure(s) Performed: Procedure(s) (LRB): KYPHOPLASTY  L2 (N/A)  Patient location during evaluation: PACU Anesthesia Type: General Level of consciousness: awake and alert Pain management: pain level controlled Vital Signs Assessment: post-procedure vital signs reviewed and stable Respiratory status: spontaneous breathing and respiratory function stable Cardiovascular status: stable Anesthetic complications: no    Last Vitals:  Vitals:   12/14/15 1633 12/14/15 1645  BP: (!) 148/80 (!) 158/82  Pulse: 90 85  Resp: 16 16  Temp: 36.7 C     Last Pain:  Vitals:   12/15/15 1011  TempSrc:   PainSc: 1                  Jannice Beitzel K

## 2015-12-18 ENCOUNTER — Encounter: Payer: Medicare Other | Attending: Nurse Practitioner | Admitting: Nurse Practitioner

## 2015-12-18 DIAGNOSIS — Z87891 Personal history of nicotine dependence: Secondary | ICD-10-CM | POA: Diagnosis not present

## 2015-12-18 DIAGNOSIS — I4891 Unspecified atrial fibrillation: Secondary | ICD-10-CM | POA: Diagnosis not present

## 2015-12-18 DIAGNOSIS — M4856XA Collapsed vertebra, not elsewhere classified, lumbar region, initial encounter for fracture: Secondary | ICD-10-CM | POA: Insufficient documentation

## 2015-12-18 DIAGNOSIS — I10 Essential (primary) hypertension: Secondary | ICD-10-CM | POA: Insufficient documentation

## 2015-12-18 DIAGNOSIS — L89311 Pressure ulcer of right buttock, stage 1: Secondary | ICD-10-CM | POA: Diagnosis not present

## 2015-12-18 DIAGNOSIS — Z8546 Personal history of malignant neoplasm of prostate: Secondary | ICD-10-CM | POA: Diagnosis not present

## 2015-12-19 NOTE — Progress Notes (Signed)
Gerald [name], Gerald [name] ([id number]) Visit Report for 12/18/2015 Chief Complaint Document Details AITOR, ORSBURN [id number] 2:15 Patient Name: Date of Service: Gerald [name]: [id number] [name]: Treating RN: Montey Hora Date of Birth/Sex: Jan 26, 1921 (82 y.o. Male) Other Clinician: Primary Care Physician: [name] Treating Londell Moh Referring Physician: [name] Physician/Extender: Suella Grove in Treatment: 1 Information Obtained from: Patient Chief Complaint Patient is at the clinic for treatment of an open pressure ulcer in the region of the right gluteal area for about 2 weeks now Electronic Signature(s) Signed: 12/18/2015 4:03:42 PM By: Londell Moh FNP Entered By: Londell Moh on [id number] GUNTHER, BUSSIE ([id number]) -------------------------------------------------------------------------------- HPI Details VOLNEY, BUTER [id number] 2:15 Patient Name: Date of Service: Gerald [name]: [id number] [name]: Treating RN: Montey Hora Date of Birth/Sex: 1921-02-13 (82 y.o. Male) Other Clinician: Primary Care Physician: [name] Treating Londell Moh Referring Physician: [name] Physician/Extender: Suella Grove in Treatment: 1 History of Present Illness Location: right gluteal area Quality: Patient reports experiencing a dull pain to affected area(s). Severity: Patient states wound are getting worse. Duration: Patient has had the wound for < 2 weeks prior to presenting for treatment Timing: Pain in wound is Intermittent (comes and goes Context: The wound appeared gradually over time Modifying Factors: Other treatment(s) tried include:has recently been diagnosed with a L2 compression fracture and due to this he has a lot of pain Associated Signs and Symptoms: problems laying down in bed HPI Description: 82 year old gentleman who has the  chief complaints of severe back pain and has been seen by his neurosurgeon Dr. Era Bumpers. He was referred to [place] for some skin in breakdown in his gluteal region due to constantly sitting in his wheelchair. He is also being treated for compression fracture of his lumbar spine Past medical history significant for atrial fibrillation, hypertension, prostate cancer, testicular cancer, status post open cholecystectomy, prostate surgery, hernia repair. He is a former smoker. 12/18/15: returns today for f/u of right gluteal PIs. daughter reports closed. no systemic s/s of infection. Electronic Signature(s) Signed: 12/18/2015 4:03:42 PM By: Londell Moh FNP Entered By: Londell Moh on [id number] [name] ([id number]) -------------------------------------------------------------------------------- Physical Exam Details KHADE, KURSZEWSKI [id number] 2:15 Patient Name: Date of Service: Gerald [name]: [id number] [name]: Treating RN: Montey Hora Date of Birth/Sex: 12/15/1920 (82 y.o. Male) Other Clinician: Primary Care Physician: [name] Treating Londell Moh Referring Physician: [name] Physician/Extender: Suella Grove in Treatment: 1 Constitutional Patient's appearance is neat and clean. Appears in no acute distress. Well nourished and well developed.[name] Respiratory Respiratory effort is easy and symmetric bilaterally. Rate is normal at rest and on room air.[name] Psychiatric Judgement and insight intact.. Alert and oriented times 3.. Short and long term memory intact.. No evidence of depression, anxiety, or agitation. Calm, cooperative, and communicative. Appropriate interactions and affect.. Electronic Signature(s) Signed: 12/18/2015 4:03:42 PM By: Londell Moh FNP Entered By: Londell Moh on [id number] [name]  ([id number]) -------------------------------------------------------------------------------- Physician Orders Details MCRAE, DRACE [id number] 2:15 Patient Name: Date of Service: Gerald [name]: [id number] [name]: Treating RN: Montey Hora Date of Birth/Sex: 1921-01-13 (82 y.o. Male) Other Clinician: Primary Care Physician: [name] Treating Londell Moh Referring Physician: [name] Physician/Extender: Suella Grove in Treatment: 1 Verbal / Phone Orders: Yes Clinician: Montey Hora Read Back and Verified: Yes Diagnosis Coding Discharge From [facility] Services o Discharge from [place] Signature(s) Signed: 12/18/2015 4:03:42 PM By: Tye Savoy,  [name] FNP Signed: 12/18/2015 5:16:43 PM By: Montey Hora Entered By: Montey Hora on [id number] [name] ([id number]) -------------------------------------------------------------------------------- Problem List Details LARI, ZERBY [id number] 2:15 Patient Name: Date of Service: Gerald [name]: [id number] [name]: Treating RN: Montey Hora Date of Birth/Sex: 03/21/1920 (82 y.o. Male) Other Clinician: Primary Care Physician: [name] Treating Londell Moh Referring Physician: [name] Physician/Extender: Suella Grove in Treatment: 1 Active Problems ICD-10 Encounter Code Description Active Date Diagnosis L89.311 Pressure ulcer of right buttock, stage 1 12/11/2015 Yes M48.56XA Collapsed vertebra, not elsewhere classified, lumbar 12/11/2015 Yes region, initial encounter for fracture Inactive Problems Resolved Problems Electronic Signature(s) Signed: 12/18/2015 4:03:42 PM By: Londell Moh FNP Entered By: Londell Moh on [id number] [name] ([id number]) -------------------------------------------------------------------------------- Progress Note  Details CHUCKY, VANWAGONER [id number] 2:15 Patient Name: Date of Service: Gerald [name]: [id number] [name]: Treating RN: Montey Hora Date of Birth/Sex: 1920-06-16 (82 y.o. Male) Other Clinician: Primary Care Physician: [name] Treating Londell Moh Referring Physician: [name] Physician/Extender: Suella Grove in Treatment: 1 Subjective Chief Complaint Information obtained from Patient Patient is at the clinic for treatment of an open pressure ulcer in the region of the right gluteal area for about 2 weeks now History of Present Illness (HPI) The following HPI elements were documented for the patient's wound: Location: right gluteal area Quality: Patient reports experiencing a dull pain to affected area(s). Severity: Patient states wound are getting worse. Duration: Patient has had the wound for < 2 weeks prior to presenting for treatment Timing: Pain in wound is Intermittent (comes and goes Context: The wound appeared gradually over time Modifying Factors: Other treatment(s) tried include:has recently been diagnosed with a L2 compression fracture and due to this he has a lot of pain Associated Signs and Symptoms: problems laying down in bed 82 year old gentleman who has the chief complaints of severe back pain and has been seen by his neurosurgeon Dr. Era Bumpers. He was referred to [place] for some skin in breakdown in his gluteal region due to constantly sitting in his wheelchair. He is also being treated for compression fracture of his lumbar spine Past medical history significant for atrial fibrillation, hypertension, prostate cancer, testicular cancer, status post open cholecystectomy, prostate surgery, hernia repair. He is a former smoker. 12/18/15: returns today for f/u of right gluteal PIs. daughter reports closed. no systemic s/s of infection. Objective Constitutional Patient's appearance is neat and clean. Appears in no  acute distress. Well nourished and well developed.. Vitals Time Taken: 2:25 PM, Height: 69 in, Weight: 150 lbs, BMI: 22.1, Temperature: 97.8 F, Pulse: 87 bpm, Respiratory Rate: 18 breaths/min, Blood Pressure: 123/66 mmHg. CAYL, PIKE ([id number]) Respiratory Respiratory effort is easy and symmetric bilaterally. Rate is normal at rest and on room air.[name] Psychiatric Judgement and insight intact.. Alert and oriented times 3.. Short and long term memory intact.. No evidence of depression, anxiety, or agitation. Calm, cooperative, and communicative. Appropriate interactions and affect.. Integumentary (Hair, Skin) Wound #1 status is Healed - Epithelialized. Original cause of wound was Pressure Injury. The wound is located on the Right Gluteus. The wound measures 0cm length x 0cm width x 0cm depth; 0cm^2 area and 0cm^3 volume. The wound is limited to skin breakdown. There is no tunneling or undermining noted. There is a medium amount of serous drainage noted. The wound margin is flat and intact. There is large (67- 100%) pink granulation within the wound bed. There is a small (1-33%) amount of  necrotic tissue within the wound bed including Adherent Slough. The periwound skin appearance exhibited: Moist. The periwound skin appearance did not exhibit: Callus, Crepitus, Excoriation, Fluctuance, Friable, Induration, Localized Edema, Rash, Scarring, Dry/Scaly, Maceration, Atrophie Blanche, Cyanosis, Ecchymosis, Hemosiderin Staining, Mottled, Pallor, Rubor, Erythema. Assessment Active Problems ICD-10 L89.311 - Pressure ulcer of right buttock, stage 1 M48.56XA - Collapsed vertebra, not elsewhere classified, lumbar region, initial encounter for fracture Plan Discharge From [facility] Services: Discharge from [place]: A Patient Clinical Summary of Care was provided to [place] ([id number]) 1. discussed clinical findings and implications with pt and  daughter. all questions were answered. 2. no open wounds. d/c from [facility]. 3. We would be more than happy to see this pleasant gentleman in the future if he should need our services. Electronic Signature(s) Signed: 12/18/2015 4:03:42 PM By: Londell Moh FNP Entered By: Londell Moh on [id number] [name] ([id number]) -------------------------------------------------------------------------------- [place] Details Patient Name: [name] Date of Service: 12/18/2015 Medical Record [name]: [id number] Patient Account [name]: [id number] Date of Birth/Sex: Oct 10, 1920 (82 y.o. Male) Treating RN: Montey Hora Primary Care Physician: [name] Other Clinician: Referring Physician: [name] Treating Physician/Extender: Loistine Chance in Treatment: 1 Diagnosis Coding ICD-10 Codes Code Description [id number] Pressure ulcer of right buttock, stage 1 M48.56XA Collapsed vertebra, not elsewhere classified, lumbar region, initial encounter for fracture Facility Procedures CPT4 Code: [id number] Description: [phone] - WOUND CARE VISIT-LEV 2 EST PT Modifier: Quantity: 1 Physician Procedures CPT4 Code: [id number] Description: [id number] - WC PHYS LEVEL 2 - EST PT ICD-10 Description Diagnosis L89.311 Pressure ulcer of right buttock, stage 1 Modifier: Quantity: 1 Electronic Signature(s) Signed: 12/18/2015 4:23:36 PM By: Montey Hora Previous Signature: 12/18/2015 4:03:42 PM Version By: Londell Moh FNP Entered By: Montey Hora on 12/18/2015 16:23:35

## 2015-12-19 NOTE — Progress Notes (Signed)
RYNE, CORNELSON ([id number]) Visit Report for 12/18/2015 Arrival Information Details Patient Name: Gerald [name], Gerald [name] Date of Service: 12/18/2015 2:15 PM Medical Record Number: [id number] Patient Account Number: [id number] Date of Birth/Sex: 1921-03-16 (82 y.o. Male) Treating RN: Montey Hora Primary Care Physician: [name] Other Clinician: Referring Physician: [name] Treating Physician/Extender: Loistine Chance in Treatment: 1 Visit Information History Since Last Visit Added or deleted any medications: No Patient Arrived: Gil[place] Rile Any new allergies or adverse reactions: No Arrival Time: 14:25 Had a fall or experienced change in No Accompanied By: dtr activities of daily living that may affect Transfer Assistance: None risk of falls: Patient Identification Verified: Yes Signs or symptoms of abuse/neglect since last No Secondary Verification Process Yes visito Completed: Hospitalized since last visit: No Patient Requires Transmission- No Pain Present Now: No Based Precautions: Patient Has Alerts: Yes Patient Alerts: Patient on Blood Thinner 81 mg aspirin NOT Diabetic Electronic Signature(s) Signed: 12/18/2015 5:16:43 PM By: Montey Hora Entered By: Montey Hora on [id number] Gerald [name] ([id number]) -------------------------------------------------------------------------------- Clinic Level of Care Assessment Details Patient Name: Gerald [name] Date of Service: 12/18/2015 2:15 PM Medical Record Number: [id number] Patient Account Number: [id number] Date of Birth/Sex: [birth year] (82 y.o. Male) Treating RN: Montey Hora Primary Care Physician: [name] Other Clinician: Referring Physician: [name] Treating Physician/Extender: Loistine Chance in Treatment: 1 Clinic Level of Care Assessment Items TOOL 4 Quantity Score []  - Use when only an EandM is performed on FOLLOW-UP visit 0 ASSESSMENTS  - Nursing Assessment / Reassessment X - Reassessment of Co-morbidities (includes updates in patient status) 1 10 X - Reassessment of Adherence to Treatment Plan 1 5 ASSESSMENTS - Wound and Skin Assessment / Reassessment X - Simple Wound Assessment / Reassessment - one wound 1 5 []  - Complex Wound Assessment / Reassessment - multiple wounds 0 []  - Dermatologic / Skin Assessment (not related to wound area) 0 ASSESSMENTS - Focused Assessment []  - Circumferential Edema Measurements - multi extremities 0 []  - Nutritional Assessment / Counseling / Intervention 0 []  - Lower Extremity Assessment (monofilament, tuning fork, pulses) 0 []  - Peripheral Arterial Disease Assessment (using hand held doppler) 0 ASSESSMENTS - Ostomy and/or Continence Assessment and Care []  - Incontinence Assessment and Management 0 []  - Ostomy Care Assessment and Management (repouching, etc.) 0 PROCESS - Coordination of Care X - Simple Patient / Family Education for ongoing care 1 15 []  - Complex (extensive) Patient / Family Education for ongoing care 0 []  - Staff obtains Programmer, systems, Records, Test Results / Process Orders 0 []  - Staff telephones HHA, Nursing Homes / Clarify orders / etc 0 []  - Routine Transfer to another Facility (non-emergent condition) 0 KIAEEM, GOOLD ([id number]) []  - Routine Hospital Admission (non-emergent condition) 0 []  - New Admissions / Biomedical engineer / Ordering NPWT, Apligraf, etc. 0 []  - Emergency Hospital Admission (emergent condition) 0 X - Simple Discharge Coordination 1 10 []  - Complex (extensive) Discharge Coordination 0 PROCESS - Special Needs []  - Pediatric / Minor Patient Management 0 []  - Isolation Patient Management 0 []  - Hearing / Language / Visual special needs 0 []  - Assessment of Community assistance (transportation, D/C planning, etc.) 0 []  - Additional assistance / Altered mentation 0 []  - Support Surface(s) Assessment (bed, cushion, seat, etc.)  0 INTERVENTIONS - Wound Cleansing / Measurement X - Simple Wound Cleansing - one wound 1 5 []  - Complex Wound Cleansing - multiple wounds 0 X - Wound Imaging (photographs - any number of wounds)  1 5 []  - Wound Tracing (instead of photographs) 0 X - Simple Wound Measurement - one wound 1 5 []  - Complex Wound Measurement - multiple wounds 0 INTERVENTIONS - Wound Dressings []  - Small Wound Dressing one or multiple wounds 0 []  - Medium Wound Dressing one or multiple wounds 0 []  - Large Wound Dressing one or multiple wounds 0 []  - Application of Medications - topical 0 []  - Application of Medications - injection 0 INTERVENTIONS - Miscellaneous []  - External ear exam 0 BRENDIN, GAHN ([id number]) []  - Specimen Collection (cultures, biopsies, blood, body fluids, etc.) 0 []  - Specimen(s) / Culture(s) sent or taken to Lab for analysis 0 []  - Patient Transfer (multiple staff / Harrel Lemon Lift / Similar devices) 0 []  - Simple Staple / Suture removal (25 or less) 0 []  - Complex Staple / Suture removal (26 or more) 0 []  - Hypo / Hyperglycemic Management (close monitor of Blood Glucose) 0 []  - Ankle / Brachial Index (ABI) - do not check if billed separately 0 X - Vital Signs 1 5 Has the patient been seen at the hospital within the last three years: Yes Total Score: 65 Level Of Care: New/Established - Level 2 Electronic Signature(s) Signed: 12/18/2015 5:16:43 PM By: Montey Hora Entered By: Montey Hora on [id number] Gerald [name] ([id number]) -------------------------------------------------------------------------------- Encounter Discharge Information Details Patient Name: Gerald [name] Date of Service: 12/18/2015 2:15 PM Medical Record Number: [id number] Patient Account Number: [id number] Date of Birth/Sex: Jul 25, 1920 (82 y.o. Male) Treating RN: Montey Hora Primary Care Physician: [name] Other Clinician: Referring Physician: [name] Treating  Physician/Extender: Loistine Chance in Treatment: 1 Encounter Discharge Information Items Discharge Pain Level: 0 Discharge Condition: Stable Ambulatory Status: Walker Discharge Destination: Home Transportation: Private Auto Accompanied By: dtr Schedule Follow-up Appointment: No Medication Reconciliation completed No and provided to Patient/Care Landon Truax: Provided on Clinical Summary of Care: 12/18/2015 Form Type Recipient Paper Patient GP Electronic Signature(s) Signed: 12/18/2015 4:23:58 PM By: Montey Hora Previous Signature: 12/18/2015 2:55:16 PM Version By: Ruthine Dose Entered By: Montey Hora on [id number] Gerald [name] ([id number]) -------------------------------------------------------------------------------- Multi Wound Chart Details Patient Name: Gerald [name] Date of Service: 12/18/2015 2:15 PM Medical Record Number: [id number] Patient Account Number: [id number] Date of Birth/Sex: 04-30-1920 (82 y.o. Male) Treating RN: Montey Hora Primary Care Physician: [name] Other Clinician: Referring Physician: [name] Treating Physician/Extender: Loistine Chance in Treatment: 1 Vital Signs Height(in): 69 Pulse(bpm): 87 Weight(lbs): 150 Blood Pressure 123/66 (mmHg): Body Mass Index(BMI): 22 Temperature(F): 97.8 Respiratory Rate 18 (breaths/min): Photos: [N/A:N/A] Wound Location: Right Gluteus N/A N/A Wounding Event: Pressure Injury N/A N/A Primary Etiology: Pressure Ulcer N/A N/A Comorbid History: Cataracts, Arrhythmia, N/A N/A History of pressure wounds, Osteoarthritis Date Acquired: 11/27/2015 N/A N/A Weeks of Treatment: 1 N/A N/A Wound Status: Open N/A N/A Measurements L x W x D 0.1x0.1x0.1 N/A N/A (cm) Area (cm) : 0.008 N/A N/A Volume (cm) : 0.001 N/A N/A % Reduction in Area: 98.00% N/A N/A % Reduction in Volume: 97.40% N/A N/A Classification: Category/Stage II N/A N/A Exudate Amount:  Medium N/A N/A Exudate Type: Serous N/A N/A Exudate Color: amber N/A N/A Wound Margin: Flat and Intact N/A N/A Granulation Amount: Large (67-100%) N/A N/A Granulation Quality: Pink N/A N/A Necrotic Amount: Small ([id number]) N/A N/A RAKIN, CORKRAN ([id number]) Exposed Structures: Fascia: No N/A N/A Fat: No Tendon: No Muscle: No Joint: No Bone: No Limited to Skin Breakdown Epithelialization: None N/A N/A Periwound Skin Texture: Edema: No N/A N/A  Excoriation: No Induration: No Callus: No Crepitus: No Fluctuance: No Friable: No Rash: No Scarring: No Periwound Skin Moist: Yes N/A N/A Moisture: Maceration: No Dry/Scaly: No Periwound Skin Color: Atrophie Blanche: No N/A N/A Cyanosis: No Ecchymosis: No Erythema: No Hemosiderin Staining: No Mottled: No Pallor: No Rubor: No Tenderness on No N/A N/A Palpation: Wound Preparation: Ulcer Cleansing: N/A N/A Rinsed/Irrigated with Saline Topical Anesthetic Applied: None Treatment Notes Electronic Signature(s) Signed: 12/18/2015 5:16:43 PM By: Montey Hora Entered By: Montey Hora on [id number] Gerald [name] ([id number]) -------------------------------------------------------------------------------- [place] Details Patient Name: Gerald [name] Date of Service: 12/18/2015 2:15 PM Medical Record Number: [id number] Patient Account Number: [id number] Date of Birth/Sex: 1920-03-19 (82 y.o. Male) Treating RN: Montey Hora Primary Care Physician: [name] Other Clinician: Referring Physician: [name] Treating Physician/Extender: Loistine Chance in Treatment: 1 Active Inactive Electronic Signature(s) Signed: 12/18/2015 4:22:53 PM By: Montey Hora Entered By: Montey Hora on [id number] Gerald [name] ([id number]) -------------------------------------------------------------------------------- Pain Assessment Details Patient Name:  Gerald [name] Date of Service: 12/18/2015 2:15 PM Medical Record Number: [id number] Patient Account Number: [id number] Date of Birth/Sex: 1920-12-29 (82 y.o. Male) Treating RN: Montey Hora Primary Care Physician: [name] Other Clinician: Referring Physician: [name] Treating Physician/Extender: Loistine Chance in Treatment: 1 Active Problems Location of Pain Severity and Description of Pain Patient Has Paino No Site Locations Pain Management and Medication Current Pain Management: Notes Topical or injectable lidocaine is offered to patient for acute pain when surgical debridement is performed. If needed, Patient is instructed to use over the counter pain medication for the following 24-48 hours after debridement. Wound care MDs do not prescribed pain medications. Patient has chronic pain or uncontrolled pain. Patient has been instructed to make an appointment with their Primary Care Physician for pain management. Electronic Signature(s) Signed: 12/18/2015 5:16:43 PM By: Montey Hora Entered By: Montey Hora on [id number] Gerald [name] ([id number]) -------------------------------------------------------------------------------- Patient/Caregiver Education Details Patient Name: Gerald [name] Date of Service: 12/18/2015 2:15 PM Medical Record Number: [id number] Patient Account Number: [id number] Date of Birth/Gender: 07-17-1920 (82 y.o. Male) Treating RN: Montey Hora Primary Care Physician: [name] Other Clinician: Referring Physician: [name] Treating Physician/Extender: Loistine Chance in Treatment: 1 Education Assessment Education Provided To: Patient Education Topics Provided Basic Hygiene: Handouts: Other: care of newly healed ulcer site Methods: Explain/Verbal Responses: State content correctly Electronic Signature(s) Signed: 12/18/2015 5:16:43 PM By: Montey Hora Entered By: Montey Hora on [id number] Gerald [name] ([id number]) -------------------------------------------------------------------------------- Wound Assessment Details Patient Name: Gerald [name] Date of Service: 12/18/2015 2:15 PM Medical Record Number: [id number] Patient Account Number: [id number] Date of Birth/Sex: Feb 03, 1921 (82 y.o. Male) Treating RN: Montey Hora Primary Care Physician: [name] Other Clinician: Referring Physician: [name] Treating Physician/Extender: Loistine Chance in Treatment: 1 Wound Status Wound Number: 1 Primary Pressure Ulcer Etiology: Wound Location: Right Gluteus Wound Healed - Epithelialized Wounding Event: Pressure Injury Status: Date Acquired: 11/27/2015 Comorbid Cataracts, Arrhythmia, History of Weeks Of Treatment: 1 History: pressure wounds, Osteoarthritis Clustered Wound: No Photos Wound Measurements Length: (cm) 0 % Reduction in A Width: (cm) 0 % Reduction in V Depth: (cm) 0 Epithelializatio Area: (cm) 0 Tunneling: Volume: (cm) 0 Undermining: rea: 100% olume: 100% n: None No No Wound Description Classification: Category/Stage II Foul Odor After Wound Margin: Flat and Intact Exudate Amount: Medium Exudate Type: Serous Exudate Color: amber Cleansing: No Wound Bed Granulation Amount: Large (67-100%) Exposed Structure Granulation Quality: Pink Fascia Exposed: No Necrotic  Amount: Small (1-33%) Fat Layer Exposed: No Necrotic Quality: Adherent Slough Tendon Exposed: No Muscle Exposed: No NILMAR, HAGERMAN ([id number]) Joint Exposed: No Bone Exposed: No Limited to Skin Breakdown Periwound Skin Texture Texture Color No Abnormalities Noted: No No Abnormalities Noted: No Callus: No Atrophie Blanche: No Crepitus: No Cyanosis: No Excoriation: No Ecchymosis: No Fluctuance: No Erythema: No Friable: No Hemosiderin Staining: No Induration: No Mottled: No Localized Edema: No Pallor:  No Rash: No Rubor: No Scarring: No Moisture No Abnormalities Noted: No Dry / Scaly: No Maceration: No Moist: Yes Wound Preparation Ulcer Cleansing: Rinsed/Irrigated with Saline Topical Anesthetic Applied: None Electronic Signature(s) Signed: 12/18/2015 5:16:43 PM By: Montey Hora Entered By: Montey Hora on [id number] Gerald [name] ([id number]) -------------------------------------------------------------------------------- [place] Details Patient Name: Gerald [name] Date of Service: 12/18/2015 2:15 PM Medical Record Number: [id number] Patient Account Number: [id number] Date of Birth/Sex: March 29, 1920 (82 y.o. Male) Treating RN: Montey Hora Primary Care Physician: [name] Other Clinician: Referring Physician: [name] Treating Physician/Extender: Loistine Chance in Treatment: 1 Vital Signs Time Taken: 14:25 Temperature (F): 97.8 Height (in): 69 Pulse (bpm): 87 Weight (lbs): 150 Respiratory Rate (breaths/min): 18 Body Mass Index (BMI): 22.1 Blood Pressure (mmHg): 123/66 Reference Range: 80 - 120 mg / dl Electronic Signature(s) Signed: 12/18/2015 5:16:43 PM By: Montey Hora Entered By: Montey Hora on 12/18/2015 14:27:14

## 2015-12-20 LAB — SURGICAL PATHOLOGY

## 2016-01-19 ENCOUNTER — Inpatient Hospital Stay: Payer: Medicare Other

## 2016-01-19 ENCOUNTER — Inpatient Hospital Stay: Payer: Medicare Other | Attending: Internal Medicine

## 2016-01-19 DIAGNOSIS — D509 Iron deficiency anemia, unspecified: Secondary | ICD-10-CM

## 2016-01-19 LAB — CBC WITH DIFFERENTIAL/PLATELET
BASOS ABS: 0.2 10*3/uL — AB (ref 0–0.1)
BASOS PCT: 2 %
Eosinophils Absolute: 0.1 10*3/uL (ref 0–0.7)
Eosinophils Relative: 2 %
HEMATOCRIT: 29.8 % — AB (ref 40.0–52.0)
HEMOGLOBIN: 10.4 g/dL — AB (ref 13.0–18.0)
Lymphocytes Relative: 22 %
Lymphs Abs: 1.8 10*3/uL (ref 1.0–3.6)
MCH: 34.6 pg — ABNORMAL HIGH (ref 26.0–34.0)
MCHC: 34.8 g/dL (ref 32.0–36.0)
MCV: 99.4 fL (ref 80.0–100.0)
Monocytes Absolute: 0.9 10*3/uL (ref 0.2–1.0)
Monocytes Relative: 11 %
NEUTROS ABS: 5.2 10*3/uL (ref 1.4–6.5)
Neutrophils Relative %: 63 %
Platelets: 341 10*3/uL (ref 150–440)
RBC: 3 MIL/uL — ABNORMAL LOW (ref 4.40–5.90)
RDW: 25.8 % — ABNORMAL HIGH (ref 11.5–14.5)
WBC: 8.2 10*3/uL (ref 3.8–10.6)

## 2016-01-19 LAB — FERRITIN: Ferritin: 1164 ng/mL — ABNORMAL HIGH (ref 24–336)

## 2016-01-29 ENCOUNTER — Telehealth: Payer: Self-pay | Admitting: *Deleted

## 2016-01-29 NOTE — Telephone Encounter (Signed)
-----   Message from Cammie Sickle, MD sent at 01/27/2016  1:28 AM EST ----- Please inform pt that labs- are fairly stable; no new recommendations at this time. Follow up as planned.

## 2016-01-29 NOTE — Telephone Encounter (Signed)
Left msg for patient to contact our office to discuss his test results

## 2016-04-19 ENCOUNTER — Other Ambulatory Visit: Payer: Self-pay

## 2016-04-19 ENCOUNTER — Inpatient Hospital Stay: Payer: Medicare Other

## 2016-04-19 ENCOUNTER — Inpatient Hospital Stay (HOSPITAL_BASED_OUTPATIENT_CLINIC_OR_DEPARTMENT_OTHER): Payer: Medicare Other | Admitting: Internal Medicine

## 2016-04-19 ENCOUNTER — Inpatient Hospital Stay: Payer: Medicare Other | Attending: Internal Medicine

## 2016-04-19 DIAGNOSIS — R634 Abnormal weight loss: Secondary | ICD-10-CM

## 2016-04-19 DIAGNOSIS — N189 Chronic kidney disease, unspecified: Secondary | ICD-10-CM | POA: Insufficient documentation

## 2016-04-19 DIAGNOSIS — D631 Anemia in chronic kidney disease: Secondary | ICD-10-CM | POA: Diagnosis not present

## 2016-04-19 DIAGNOSIS — I129 Hypertensive chronic kidney disease with stage 1 through stage 4 chronic kidney disease, or unspecified chronic kidney disease: Secondary | ICD-10-CM | POA: Insufficient documentation

## 2016-04-19 DIAGNOSIS — M549 Dorsalgia, unspecified: Secondary | ICD-10-CM | POA: Diagnosis not present

## 2016-04-19 DIAGNOSIS — Z87891 Personal history of nicotine dependence: Secondary | ICD-10-CM | POA: Insufficient documentation

## 2016-04-19 DIAGNOSIS — R630 Anorexia: Secondary | ICD-10-CM | POA: Insufficient documentation

## 2016-04-19 DIAGNOSIS — R42 Dizziness and giddiness: Secondary | ICD-10-CM | POA: Diagnosis not present

## 2016-04-19 DIAGNOSIS — I4891 Unspecified atrial fibrillation: Secondary | ICD-10-CM | POA: Diagnosis not present

## 2016-04-19 DIAGNOSIS — Z8546 Personal history of malignant neoplasm of prostate: Secondary | ICD-10-CM | POA: Insufficient documentation

## 2016-04-19 DIAGNOSIS — Z9049 Acquired absence of other specified parts of digestive tract: Secondary | ICD-10-CM

## 2016-04-19 DIAGNOSIS — R61 Generalized hyperhidrosis: Secondary | ICD-10-CM | POA: Insufficient documentation

## 2016-04-19 DIAGNOSIS — Z7982 Long term (current) use of aspirin: Secondary | ICD-10-CM | POA: Insufficient documentation

## 2016-04-19 DIAGNOSIS — D509 Iron deficiency anemia, unspecified: Secondary | ICD-10-CM

## 2016-04-19 DIAGNOSIS — Z79899 Other long term (current) drug therapy: Secondary | ICD-10-CM | POA: Diagnosis not present

## 2016-04-19 DIAGNOSIS — D638 Anemia in other chronic diseases classified elsewhere: Secondary | ICD-10-CM

## 2016-04-19 DIAGNOSIS — Z8547 Personal history of malignant neoplasm of testis: Secondary | ICD-10-CM

## 2016-04-19 LAB — CBC WITH DIFFERENTIAL/PLATELET
Basophils Absolute: 0.1 10*3/uL (ref 0–0.1)
Basophils Relative: 1 %
Eosinophils Absolute: 0.3 10*3/uL (ref 0–0.7)
Eosinophils Relative: 4 %
HEMATOCRIT: 33 % — AB (ref 40.0–52.0)
HEMOGLOBIN: 11 g/dL — AB (ref 13.0–18.0)
LYMPHS ABS: 2.2 10*3/uL (ref 1.0–3.6)
LYMPHS PCT: 25 %
MCH: 32.3 pg (ref 26.0–34.0)
MCHC: 33.4 g/dL (ref 32.0–36.0)
MCV: 96.6 fL (ref 80.0–100.0)
MONO ABS: 0.8 10*3/uL (ref 0.2–1.0)
MONOS PCT: 9 %
NEUTROS ABS: 5.6 10*3/uL (ref 1.4–6.5)
Neutrophils Relative %: 61 %
Platelets: 338 10*3/uL (ref 150–440)
RBC: 3.41 MIL/uL — ABNORMAL LOW (ref 4.40–5.90)
RDW: 25 % — AB (ref 11.5–14.5)
WBC: 9.1 10*3/uL (ref 3.8–10.6)

## 2016-04-19 LAB — IRON AND TIBC
Iron: 82 ug/dL (ref 45–182)
Saturation Ratios: 33 % (ref 17.9–39.5)
TIBC: 248 ug/dL — ABNORMAL LOW (ref 250–450)
UIBC: 166 ug/dL

## 2016-04-19 LAB — FERRITIN: Ferritin: 1263 ng/mL — ABNORMAL HIGH (ref 24–336)

## 2016-04-19 NOTE — Progress Notes (Signed)
[facility] OFFICE PROGRESS NOTE  Patient Care Team: [name], MD as PCP - General (Family Medicine)   SUMMARY OF HEMATOLOGIC HISTORY:  # Anemia sec to ? CKD  Macrocytic ? MDS- no BMBx] on Procrit 20000 Units q ? 60M  # ? Hemochromatosis- heterozygous [ferritin 800-900s] - on surveillance.   INTERVAL HISTORY:   A very pleasant 83 year old male patient who is fairly active for his age  Currently on procrit for anemia secondary to CKD is here for follow-up.  Last Procrit was approximately 9 months ago.  His only complaint today is back pain and mild vertigo. Both are being managed by his PCP. In September is underwent a khypoplasty which has helped his back pain. His energy and functional level have greatly improved since his surgery. He denies any nausea vomiting.  Denies any chest pain or shortness of breath or cough. Denies any blood in stools or black stools. His daughter does mention some appetite issues, stating that he has not been eating much and skipping meals. He admits to not having as great of an appetite.    REVIEW OF SYSTEMS:  A complete 10 point review of system is done which is negative except mentioned above/history of present illness.   PAST MEDICAL HISTORY :  Past Medical History:  Diagnosis Date  . Anemia   . Atrial fibrillation ([place])   . 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 Hemochromatosis 10/25/2014  . Hypertension   . Prostate cancer ([place])     PAST SURGICAL HISTORY :   Past Surgical History:  Procedure Laterality Date  . APPENDECTOMY    . CHOLECYSTECTOMY    . HAND SURGERY Bilateral   . INNER EAR SURGERY    . KYPHOPLASTY N/A 12/14/2015   Procedure: KYPHOPLASTY  L2;  Surgeon: Hessie Knows, MD;  Location: [facility] ORS;  Service: Orthopedics;  Laterality: N/A;  . PROSTATE SURGERY    . SURGERY SCROTAL / TESTICULAR Right     FAMILY HISTORY :   Family History  Problem Relation Age of Onset  . Hypertension Mother     SOCIAL HISTORY:   Social  History  Substance Use Topics  . Smoking status: Former Smoker    Types: Cigarettes    Quit date: 03/22/1958  . Smokeless tobacco: Never Used  . Alcohol use No    ALLERGIES:  is allergic to levofloxacin.  MEDICATIONS:  Current Outpatient Prescriptions  Medication Sig Dispense Refill  . aspirin EC 81 MG tablet Take by mouth.    [name] b complex vitamins capsule Take by mouth.    [name] CALCIUM-MAGNESIUM-ZINC PO Take by mouth.    Mariane Baumgarten Calcium (STOOL SOFTENER PO) Take by mouth daily.    [name] lisinopril-hydrochlorothiazide (PRINZIDE,ZESTORETIC) 10-12.5 MG tablet     . Multiple Vitamins-Minerals (CENTRUM SILVER ADULT 50+) TABS Take by mouth.    . Potassium 99 MG TABS Take by mouth.    . Vitamin D, Cholecalciferol, 1000 units CAPS Take 1,000 Units by mouth daily.     No current facility-administered medications for this visit.     PHYSICAL EXAMINATION:   BP 121/79 (BP Location: Left Arm, Patient Position: Sitting)   Pulse 92   Temp (!) 96.5 F (35.8 C) (Tympanic)   Wt 144 lb 8 oz (65.5 kg)   BMI 21.34 kg/m   Filed Weights   04/19/16 1113  Weight: 144 lb 8 oz (65.5 kg)    GENERAL: Well-nourished well-developed; Alert, no distress and comfortable. He is alone.  Walks himself. EYES: no pallor or icterus OROPHARYNX: no thrush or ulceration; good dentition  NECK: supple, no masses felt LYMPH:  no palpable lymphadenopathy in the cervical, axillary or inguinal regions LUNGS: clear to auscultation and  No wheeze or crackles HEART/CVS: regular rate & irregular rhythm and no murmurs; No lower extremity edema ABDOMEN:abdomen soft, non-tender and normal bowel sounds Musculoskeletal:no cyanosis of digits and no clubbing  PSYCH: alert & oriented x 3 with fluent speech NEURO: no focal motor/sensory deficits SKIN:  no rashes or significant lesions  LABORATORY DATA:  I have reviewed the data as listed    Component Value Date/Time   NA 139 07/21/2015 1011   NA 142 05/06/2013 0158   K 4.0  07/21/2015 1011   K 3.7 05/06/2013 0158   CL 107 07/21/2015 1011   CL 112 (H) 05/06/2013 0158   CO2 26 07/21/2015 1011   CO2 24 05/06/2013 0158   GLUCOSE 107 (H) 07/21/2015 1011   GLUCOSE 95 05/06/2013 0158   BUN 25 (H) 07/21/2015 1011   BUN 32 (H) 05/06/2013 0158   CREATININE 1.20 11/23/2015 1150   CREATININE 1.22 11/25/2013 1332   CALCIUM 9.3 07/21/2015 1011   CALCIUM 9.1 05/06/2013 0158   PROT 6.6 07/21/2015 1011   PROT 6.1 (L) 05/06/2013 0158   ALBUMIN 4.3 07/21/2015 1011   ALBUMIN 3.5 05/06/2013 0158   AST 21 07/21/2015 1011   AST 22 05/06/2013 0158   ALT 15 (L) 07/21/2015 1011   ALT 18 05/06/2013 0158   ALKPHOS 44 07/21/2015 1011   ALKPHOS 60 05/06/2013 0158   BILITOT 1.5 (H) 07/21/2015 1011   BILITOT 1.4 (H) 05/06/2013 0158   GFRNONAA 52 (L) 07/21/2015 1011   GFRNONAA 52 (L) 11/25/2013 1332   GFRAA >60 07/21/2015 1011   GFRAA >60 11/25/2013 1332    No results found for: SPEP, UPEP  Lab Results  Component Value Date   WBC 9.1 04/19/2016   NEUTROABS 5.6 04/19/2016   HGB 11.0 (L) 04/19/2016   HCT 33.0 (L) 04/19/2016   MCV 96.6 04/19/2016   PLT 338 04/19/2016      Chemistry      Component Value Date/Time   NA 139 07/21/2015 1011   NA 142 05/06/2013 0158   K 4.0 07/21/2015 1011   K 3.7 05/06/2013 0158   CL 107 07/21/2015 1011   CL 112 (H) 05/06/2013 0158   CO2 26 07/21/2015 1011   CO2 24 05/06/2013 0158   BUN 25 (H) 07/21/2015 1011   BUN 32 (H) 05/06/2013 0158   CREATININE 1.20 11/23/2015 1150   CREATININE 1.22 11/25/2013 1332      Component Value Date/Time   CALCIUM 9.3 07/21/2015 1011   CALCIUM 9.1 05/06/2013 0158   ALKPHOS 44 07/21/2015 1011   ALKPHOS 60 05/06/2013 0158   AST 21 07/21/2015 1011   AST 22 05/06/2013 0158   ALT 15 (L) 07/21/2015 1011   ALT 18 05/06/2013 0158   BILITOT 1.5 (H) 07/21/2015 1011   BILITOT 1.4 (H) 05/06/2013 0158       RADIOGRAPHIC STUDIES: I have personally reviewed the radiological images as listed and agreed  with the findings in the report. No results found.   ASSESSMENT & PLAN:   Anemia of chronic disease #  Anemia secondary to CKD/ ? MDS  [no previous BMBx]-  Procrit 20,000 units;  Last given in May 2017.  Today hemoglobin is 11.0. He will not need Procrit.  #  Check CBC CMP/  Ferritin/ iron/  possible Procrit;  3 months;  Follow-up with MD in 6 months with labs/ possible Procrit  # Hemochromatosis- Heterozygous- Ferrtin Feb 2018-1263. No end organ damage. Discussed that I would not recommend phlebotomy given his anemia and both the patient and daughter are okay with this.  #Appetite: Mild weight loss (4lbs since Aug). Encouraged Ensures, Boosts or protein shakes in between meals.    Faythe Casa, NP   Jacquelin Hawking, NP 04/19/2016 12:34 PM  I personally interviewed and examined the patient. Agreed with the above plan of care. Patient/family questions were answered. Dr.Brahmanday MD

## 2016-04-19 NOTE — Assessment & Plan Note (Addendum)
#    Anemia secondary to CKD/ ? MDS  [no previous BMBx]-  Procrit 20,000 units;  Last given in May 2017.  Today hemoglobin is 11.0. He will not need Procrit.  #  Check CBC CMP/  Ferritin/ iron/ possible Procrit;  3 months;  Follow-up with MD in 6 months with labs/ possible Procrit  # Hemochromatosis- Heterozygous- Ferrtin Feb 2018-1263. No end organ damage. Discussed that I would not recommend phlebotomy given his anemia and both the patient and daughter are okay with this.  #Appetite: Mild weight loss (4lbs since Aug). Encouraged Ensures, Boosts or protein shakes in between meals.

## 2016-04-19 NOTE — Progress Notes (Signed)
Patient here today for follow up.   

## 2016-07-17 ENCOUNTER — Inpatient Hospital Stay: Payer: Medicare Other

## 2016-07-19 ENCOUNTER — Inpatient Hospital Stay: Payer: Medicare Other | Attending: Internal Medicine

## 2016-07-19 ENCOUNTER — Inpatient Hospital Stay: Payer: Medicare Other

## 2016-07-19 VITALS — BP 107/68 | HR 90

## 2016-07-19 DIAGNOSIS — I129 Hypertensive chronic kidney disease with stage 1 through stage 4 chronic kidney disease, or unspecified chronic kidney disease: Secondary | ICD-10-CM | POA: Insufficient documentation

## 2016-07-19 DIAGNOSIS — D631 Anemia in chronic kidney disease: Secondary | ICD-10-CM

## 2016-07-19 DIAGNOSIS — N189 Chronic kidney disease, unspecified: Principal | ICD-10-CM

## 2016-07-19 DIAGNOSIS — D638 Anemia in other chronic diseases classified elsewhere: Secondary | ICD-10-CM

## 2016-07-19 DIAGNOSIS — Z79899 Other long term (current) drug therapy: Secondary | ICD-10-CM | POA: Diagnosis not present

## 2016-07-19 LAB — COMPREHENSIVE METABOLIC PANEL
ALT: 14 U/L — AB (ref 17–63)
ANION GAP: 5 (ref 5–15)
AST: 23 U/L (ref 15–41)
Albumin: 4.2 g/dL (ref 3.5–5.0)
Alkaline Phosphatase: 52 U/L (ref 38–126)
BILIRUBIN TOTAL: 1.7 mg/dL — AB (ref 0.3–1.2)
BUN: 24 mg/dL — ABNORMAL HIGH (ref 6–20)
CO2: 27 mmol/L (ref 22–32)
CREATININE: 0.98 mg/dL (ref 0.61–1.24)
Calcium: 9.2 mg/dL (ref 8.9–10.3)
Chloride: 107 mmol/L (ref 101–111)
GFR calc non Af Amer: >60 mL/min (ref >60)
GLUCOSE: 128 mg/dL — AB (ref 65–99)
Potassium: 3.7 mmol/L (ref 3.5–5.1)
Sodium: 139 mmol/L (ref 135–145)
Total Protein: 6.5 g/dL (ref 6.5–8.1)

## 2016-07-19 LAB — CBC WITH DIFFERENTIAL/PLATELET
Basophils Absolute: 0 10*3/uL (ref 0–0.1)
Basophils Relative: 0 %
Eosinophils Absolute: 0.2 10*3/uL (ref 0–0.7)
Eosinophils Relative: 2 %
HCT: 24.8 % — ABNORMAL LOW (ref 40.0–52.0)
Hemoglobin: 8.7 g/dL — ABNORMAL LOW (ref 13.0–18.0)
LYMPHS ABS: 2.3 10*3/uL (ref 1.0–3.6)
LYMPHS PCT: 32 %
MCH: 35.5 pg — AB (ref 26.0–34.0)
MCHC: 35.2 g/dL (ref 32.0–36.0)
MCV: 100.9 fL — AB (ref 80.0–100.0)
MONO ABS: 0.8 10*3/uL (ref 0.2–1.0)
MONOS PCT: 11 %
Neutro Abs: 3.9 10*3/uL (ref 1.4–6.5)
Neutrophils Relative %: 55 %
Platelets: 401 10*3/uL (ref 150–440)
RBC: 2.46 MIL/uL — ABNORMAL LOW (ref 4.40–5.90)
RDW: 25.2 % — AB (ref 11.5–14.5)
WBC: 7.2 10*3/uL (ref 3.8–10.6)

## 2016-07-19 LAB — FERRITIN: Ferritin: 1067 ng/mL — ABNORMAL HIGH (ref 24–336)

## 2016-07-19 MED ORDER — EPOETIN ALFA 20000 UNIT/ML IJ SOLN
20000.0000 [IU] | Freq: Once | INTRAMUSCULAR | Status: AC
  Administered 2016-07-19: 20000 [IU] via SUBCUTANEOUS
  Filled 2016-07-19: qty 1

## 2016-07-19 NOTE — Progress Notes (Signed)
Patient's hemoglobin today is 8.7 today and was 11.0 when last checked 3 months ago, does not offer any symptoms today.  I advise him that if he notices an increase in fatigue or any bleeding to not wait 3 months for a lab check but to call the office to see if MD wants to check labs sooner.

## 2016-10-16 ENCOUNTER — Inpatient Hospital Stay: Payer: Medicare Other

## 2016-10-16 ENCOUNTER — Ambulatory Visit: Payer: Medicare Other | Admitting: Internal Medicine

## 2016-10-18 ENCOUNTER — Inpatient Hospital Stay: Payer: Medicare Other

## 2016-10-18 ENCOUNTER — Inpatient Hospital Stay: Payer: Medicare Other | Attending: Internal Medicine

## 2016-10-18 ENCOUNTER — Other Ambulatory Visit: Payer: Self-pay

## 2016-10-18 ENCOUNTER — Inpatient Hospital Stay (HOSPITAL_BASED_OUTPATIENT_CLINIC_OR_DEPARTMENT_OTHER): Payer: Medicare Other | Admitting: Internal Medicine

## 2016-10-18 VITALS — BP 128/79 | HR 91 | Temp 97.9°F | Resp 20 | Ht 69.0 in | Wt 144.5 lb

## 2016-10-18 DIAGNOSIS — Z7982 Long term (current) use of aspirin: Secondary | ICD-10-CM | POA: Insufficient documentation

## 2016-10-18 DIAGNOSIS — D631 Anemia in chronic kidney disease: Secondary | ICD-10-CM | POA: Diagnosis not present

## 2016-10-18 DIAGNOSIS — D469 Myelodysplastic syndrome, unspecified: Secondary | ICD-10-CM

## 2016-10-18 DIAGNOSIS — Z87891 Personal history of nicotine dependence: Secondary | ICD-10-CM

## 2016-10-18 DIAGNOSIS — N184 Chronic kidney disease, stage 4 (severe): Secondary | ICD-10-CM

## 2016-10-18 DIAGNOSIS — I129 Hypertensive chronic kidney disease with stage 1 through stage 4 chronic kidney disease, or unspecified chronic kidney disease: Secondary | ICD-10-CM | POA: Insufficient documentation

## 2016-10-18 DIAGNOSIS — Z79899 Other long term (current) drug therapy: Secondary | ICD-10-CM | POA: Diagnosis not present

## 2016-10-18 DIAGNOSIS — Z8546 Personal history of malignant neoplasm of prostate: Secondary | ICD-10-CM

## 2016-10-18 DIAGNOSIS — D638 Anemia in other chronic diseases classified elsewhere: Secondary | ICD-10-CM

## 2016-10-18 DIAGNOSIS — D4620 Refractory anemia with excess of blasts, unspecified: Secondary | ICD-10-CM | POA: Insufficient documentation

## 2016-10-18 DIAGNOSIS — I4891 Unspecified atrial fibrillation: Secondary | ICD-10-CM | POA: Insufficient documentation

## 2016-10-18 DIAGNOSIS — C61 Malignant neoplasm of prostate: Secondary | ICD-10-CM

## 2016-10-18 LAB — COMPREHENSIVE METABOLIC PANEL
ALT: 15 U/L — ABNORMAL LOW (ref 17–63)
AST: 24 U/L (ref 15–41)
Albumin: 4.1 g/dL (ref 3.5–5.0)
Alkaline Phosphatase: 50 U/L (ref 38–126)
Anion gap: 8 (ref 5–15)
BILIRUBIN TOTAL: 1.2 mg/dL (ref 0.3–1.2)
BUN: 20 mg/dL (ref 6–20)
CHLORIDE: 108 mmol/L (ref 101–111)
CO2: 29 mmol/L (ref 22–32)
CREATININE: 0.99 mg/dL (ref 0.61–1.24)
Calcium: 9.1 mg/dL (ref 8.9–10.3)
Glucose, Bld: 110 mg/dL — ABNORMAL HIGH (ref 65–99)
POTASSIUM: 4.3 mmol/L (ref 3.5–5.1)
Sodium: 145 mmol/L (ref 135–145)
TOTAL PROTEIN: 6.4 g/dL — AB (ref 6.5–8.1)

## 2016-10-18 LAB — CBC WITH DIFFERENTIAL/PLATELET
Basophils Absolute: 0.2 10*3/uL — ABNORMAL HIGH (ref 0–0.1)
Basophils Relative: 2 %
EOS ABS: 0.3 10*3/uL (ref 0–0.7)
EOS PCT: 4 %
HCT: 30.9 % — ABNORMAL LOW (ref 40.0–52.0)
Hemoglobin: 10.5 g/dL — ABNORMAL LOW (ref 13.0–18.0)
LYMPHS ABS: 2.4 10*3/uL (ref 1.0–3.6)
LYMPHS PCT: 29 %
MCH: 34.1 pg — ABNORMAL HIGH (ref 26.0–34.0)
MCHC: 33.9 g/dL (ref 32.0–36.0)
MCV: 100.8 fL — AB (ref 80.0–100.0)
MONO ABS: 0.8 10*3/uL (ref 0.2–1.0)
MONOS PCT: 9 %
Neutro Abs: 4.7 10*3/uL (ref 1.4–6.5)
Neutrophils Relative %: 56 %
PLATELETS: 394 10*3/uL (ref 150–440)
RBC: 3.07 MIL/uL — ABNORMAL LOW (ref 4.40–5.90)
RDW: 24.1 % — AB (ref 11.5–14.5)
WBC: 8.3 10*3/uL (ref 3.8–10.6)

## 2016-10-18 LAB — FERRITIN: FERRITIN: 1283 ng/mL — AB (ref 24–336)

## 2016-10-18 NOTE — Assessment & Plan Note (Addendum)
#    Anemia secondary to CKD/ ? MDS  [no previous BMBx]-  Procrit 20,000 units;  He has been needing it every 6 months or so. Today hemoglobin is 10.2 he will not need Procrit.   #  Check CBC CMP/  Ferritin/ iron/ possible arnespt;  3 months;  Follow-up with me in 6 months with labs/ possible aranesp  # Hemochromatosis- Heterozygous- Ferrtin Sep 2016- ~900. No end organ damage.Discussed that I would not recommend phlebotomy given his anemia.

## 2016-10-18 NOTE — Progress Notes (Signed)
[place] OFFICE PROGRESS NOTE  Patient Care Team: [name], MD as PCP - General (Family Medicine)   SUMMARY OF HEMATOLOGIC HISTORY:  # Anemia sec to ? CKD  Macrocytic ? MDS- no BMBx] on Procrit 20000 Units q ? 73M  # ? Hemochromatosis- heterozygous [ferritin 800-900s] - on surveillance.   INTERVAL HISTORY:   A very pleasant 83 year old male patient who is fairly active for his age  Currently on procrit for anemia secondary to CKD is here for follow-up.  Last Procrit was approximately 3 months ago.  Patient denies any significant fatigue. Denies any chest pain or shortness of breath or cough. Appetite is good.   REVIEW OF SYSTEMS:  A complete 10 point review of system is done which is negative except mentioned above/history of present illness.   PAST MEDICAL HISTORY :  Past Medical History:  Diagnosis Date  . Anemia   . Atrial fibrillation ([place])   . Cancer [facility])    Testicular  . Hemochromatosis 10/25/2014  . Hypertension   . Prostate cancer ([place])     PAST SURGICAL HISTORY :   Past Surgical History:  Procedure Laterality Date  . APPENDECTOMY    . CHOLECYSTECTOMY    . HAND SURGERY Bilateral   . INNER EAR SURGERY    . KYPHOPLASTY N/A 12/14/2015   Procedure: KYPHOPLASTY  L2;  Surgeon: Hessie Knows, MD;  Location: [facility] ORS;  Service: Orthopedics;  Laterality: N/A;  . PROSTATE SURGERY    . SURGERY SCROTAL / TESTICULAR Right     FAMILY HISTORY :   Family History  Problem Relation Age of Onset  . Hypertension Mother     SOCIAL HISTORY:   Social History  Substance Use Topics  . Smoking status: Former Smoker    Types: Cigarettes    Quit date: 03/22/1958  . Smokeless tobacco: Never Used  . Alcohol use No    ALLERGIES:  is allergic to levofloxacin.  MEDICATIONS:  Current Outpatient Prescriptions  Medication Sig Dispense Refill  . aspirin EC 81 MG tablet Take 81 mg by mouth once.     [name] b complex vitamins capsule Take 1 capsule by mouth daily.      [name] CALCIUM-MAGNESIUM-ZINC PO Take 1 capsule by mouth daily.     Mariane Baumgarten Calcium (STOOL SOFTENER PO) Take 1 capsule by mouth 2 (two) times daily.     [name] lisinopril-hydrochlorothiazide (PRINZIDE,ZESTORETIC) 10-12.5 MG tablet     . Multiple Vitamins-Minerals (CENTRUM SILVER ADULT 50+) TABS Take by mouth.    . Potassium 99 MG TABS Take 1 tablet by mouth daily.     . Vitamin D, Cholecalciferol, 1000 units CAPS Take 1,000 Units by mouth daily.     No current facility-administered medications for this visit.     PHYSICAL EXAMINATION:   BP 128/79   Pulse 91   Temp 97.9 F (36.6 C) (Tympanic)   Resp 20   Ht 5\' 9"  (1.753 m)   Wt 144 lb 8 oz (65.5 kg)   BMI 21.34 kg/m   Filed Weights   10/18/16 1107  Weight: 144 lb 8 oz (65.5 kg)    GENERAL: Well-nourished well-developed; Alert, no distress and comfortable. He is alone. Walks himself. EYES: no pallor or icterus OROPHARYNX: no thrush or ulceration; good dentition  NECK: supple, no masses felt LYMPH:  no palpable lymphadenopathy in the cervical, axillary or inguinal regions LUNGS: clear to auscultation and  No wheeze or crackles HEART/CVS: regular rate & irregular rhythm and no  murmurs; No lower extremity edema ABDOMEN:abdomen soft, non-tender and normal bowel sounds Musculoskeletal:no cyanosis of digits and no clubbing  PSYCH: alert & oriented x 3 with fluent speech NEURO: no focal motor/sensory deficits SKIN:  no rashes or significant lesions  LABORATORY DATA:  I have reviewed the data as listed    Component Value Date/Time   NA 145 10/18/2016 1018   NA 142 05/06/2013 0158   K 4.3 10/18/2016 1018   K 3.7 05/06/2013 0158   CL 108 10/18/2016 1018   CL 112 (H) 05/06/2013 0158   CO2 29 10/18/2016 1018   CO2 24 05/06/2013 0158   GLUCOSE 110 (H) 10/18/2016 1018   GLUCOSE 95 05/06/2013 0158   BUN 20 10/18/2016 1018   BUN 32 (H) 05/06/2013 0158   CREATININE 0.99 10/18/2016 1018   CREATININE 1.22 11/25/2013 1332   CALCIUM  9.1 10/18/2016 1018   CALCIUM 9.1 05/06/2013 0158   PROT 6.4 (L) 10/18/2016 1018   PROT 6.1 (L) 05/06/2013 0158   ALBUMIN 4.1 10/18/2016 1018   ALBUMIN 3.5 05/06/2013 0158   AST 24 10/18/2016 1018   AST 22 05/06/2013 0158   ALT 15 (L) 10/18/2016 1018   ALT 18 05/06/2013 0158   ALKPHOS 50 10/18/2016 1018   ALKPHOS 60 05/06/2013 0158   BILITOT 1.2 10/18/2016 1018   BILITOT 1.4 (H) 05/06/2013 0158   GFRNONAA >60 10/18/2016 1018   GFRNONAA 52 (L) 11/25/2013 1332   GFRAA >60 10/18/2016 1018   GFRAA >60 11/25/2013 1332    No results found for: SPEP, UPEP  Lab Results  Component Value Date   WBC 8.3 10/18/2016   NEUTROABS 4.7 10/18/2016   HGB 10.5 (L) 10/18/2016   HCT 30.9 (L) 10/18/2016   MCV 100.8 (H) 10/18/2016   PLT 394 10/18/2016      Chemistry      Component Value Date/Time   NA 145 10/18/2016 1018   NA 142 05/06/2013 0158   K 4.3 10/18/2016 1018   K 3.7 05/06/2013 0158   CL 108 10/18/2016 1018   CL 112 (H) 05/06/2013 0158   CO2 29 10/18/2016 1018   CO2 24 05/06/2013 0158   BUN 20 10/18/2016 1018   BUN 32 (H) 05/06/2013 0158   CREATININE 0.99 10/18/2016 1018   CREATININE 1.22 11/25/2013 1332      Component Value Date/Time   CALCIUM 9.1 10/18/2016 1018   CALCIUM 9.1 05/06/2013 0158   ALKPHOS 50 10/18/2016 1018   ALKPHOS 60 05/06/2013 0158   AST 24 10/18/2016 1018   AST 22 05/06/2013 0158   ALT 15 (L) 10/18/2016 1018   ALT 18 05/06/2013 0158   BILITOT 1.2 10/18/2016 1018   BILITOT 1.4 (H) 05/06/2013 0158       RADIOGRAPHIC STUDIES: I have personally reviewed the radiological images as listed and agreed with the findings in the report. No results found.   ASSESSMENT & PLAN:   Anemia of chronic disease #  Anemia secondary to CKD/ ? MDS  [no previous BMBx]-  Procrit 20,000 units;  He has been needing it every 6 months or so. Today hemoglobin is 10.2 he will not need Procrit.   #  Check CBC CMP/  Ferritin/ iron/ possible arnespt;  3 months;  Follow-up  with me in 6 months with labs/ possible aranesp  # Hemochromatosis- Heterozygous- Ferrtin Sep 2016- ~900. No end organ damage.Discussed that I would not recommend phlebotomy given his anemia.   MDS (myelodysplastic syndrome), low grade (HCC) #  Anemia  secondary to CKD/ ? MDS  [no previous BMBx]- on Procrit 20,000 units;  Last given in May 2018.  Today hemoglobin is 10.5 He will not need Procrit. Would recommend switching to Aranesp every month.   # Hemochromatosis- Heterozygous- Ferrtin Feb 2018-1263. No end organ damage. Discussed that I would not recommend phlebotomy given his anemia and both the patient and daughter are okay with this.  #  Check CBC CMP/  Ferritin/ iron/ possible aranesp  3 months;  Follow-up with MD in 6 months with labs/ possible Procrit   Faythe Casa, NP   Cammie Sickle, MD 10/18/2016 2:44 PM  I personally interviewed and examined the patient. Agreed with the above plan of care. Patient/family questions were answered. Dr.Brahmanday MD

## 2016-10-18 NOTE — Assessment & Plan Note (Signed)
#    Anemia secondary to CKD/ ? MDS  [no previous BMBx]- on Procrit 20,000 units;  Last given in May 2018.  Today hemoglobin is 10.5 He will not need Procrit. Would recommend switching to Aranesp every month.   # Hemochromatosis- Heterozygous- Ferrtin Feb 2018-1263. No end organ damage. Discussed that I would not recommend phlebotomy given his anemia and both the patient and daughter are okay with this.  #  Check CBC CMP/  Ferritin/ iron/ possible aranesp  3 months;  Follow-up with MD in 6 months with labs/ possible Procrit

## 2017-01-17 ENCOUNTER — Inpatient Hospital Stay: Payer: Medicare Other | Attending: Internal Medicine

## 2017-01-17 ENCOUNTER — Inpatient Hospital Stay: Payer: Medicare Other

## 2017-01-17 DIAGNOSIS — I129 Hypertensive chronic kidney disease with stage 1 through stage 4 chronic kidney disease, or unspecified chronic kidney disease: Secondary | ICD-10-CM | POA: Insufficient documentation

## 2017-01-17 DIAGNOSIS — N189 Chronic kidney disease, unspecified: Secondary | ICD-10-CM | POA: Insufficient documentation

## 2017-01-17 DIAGNOSIS — D631 Anemia in chronic kidney disease: Secondary | ICD-10-CM | POA: Diagnosis not present

## 2017-01-17 DIAGNOSIS — D638 Anemia in other chronic diseases classified elsewhere: Secondary | ICD-10-CM

## 2017-01-17 DIAGNOSIS — C61 Malignant neoplasm of prostate: Secondary | ICD-10-CM

## 2017-01-17 LAB — HEMOGLOBIN: HEMOGLOBIN: 10.5 g/dL — AB (ref 13.0–18.0)

## 2017-01-17 LAB — HEMATOCRIT: HEMATOCRIT: 31.2 % — AB (ref 40.0–52.0)

## 2017-04-25 ENCOUNTER — Inpatient Hospital Stay (HOSPITAL_BASED_OUTPATIENT_CLINIC_OR_DEPARTMENT_OTHER): Payer: Medicare Other | Admitting: Internal Medicine

## 2017-04-25 ENCOUNTER — Inpatient Hospital Stay: Payer: Medicare Other | Attending: Internal Medicine

## 2017-04-25 ENCOUNTER — Inpatient Hospital Stay: Payer: Medicare Other

## 2017-04-25 ENCOUNTER — Other Ambulatory Visit: Payer: Self-pay

## 2017-04-25 VITALS — BP 124/78 | HR 98 | Temp 97.9°F | Resp 20

## 2017-04-25 DIAGNOSIS — I4891 Unspecified atrial fibrillation: Secondary | ICD-10-CM

## 2017-04-25 DIAGNOSIS — Z8547 Personal history of malignant neoplasm of testis: Secondary | ICD-10-CM

## 2017-04-25 DIAGNOSIS — N189 Chronic kidney disease, unspecified: Secondary | ICD-10-CM | POA: Insufficient documentation

## 2017-04-25 DIAGNOSIS — Z79899 Other long term (current) drug therapy: Secondary | ICD-10-CM | POA: Diagnosis not present

## 2017-04-25 DIAGNOSIS — I129 Hypertensive chronic kidney disease with stage 1 through stage 4 chronic kidney disease, or unspecified chronic kidney disease: Secondary | ICD-10-CM | POA: Insufficient documentation

## 2017-04-25 DIAGNOSIS — D649 Anemia, unspecified: Secondary | ICD-10-CM | POA: Insufficient documentation

## 2017-04-25 DIAGNOSIS — D4620 Refractory anemia with excess of blasts, unspecified: Secondary | ICD-10-CM

## 2017-04-25 DIAGNOSIS — D638 Anemia in other chronic diseases classified elsewhere: Secondary | ICD-10-CM

## 2017-04-25 DIAGNOSIS — Z7982 Long term (current) use of aspirin: Secondary | ICD-10-CM

## 2017-04-25 DIAGNOSIS — Z87891 Personal history of nicotine dependence: Secondary | ICD-10-CM | POA: Diagnosis not present

## 2017-04-25 DIAGNOSIS — C61 Malignant neoplasm of prostate: Secondary | ICD-10-CM

## 2017-04-25 LAB — CBC WITH DIFFERENTIAL/PLATELET
BASOS ABS: 0.2 10*3/uL — AB (ref 0–0.1)
BASOS PCT: 2 %
Eosinophils Absolute: 0.4 10*3/uL (ref 0–0.7)
Eosinophils Relative: 4 %
HEMATOCRIT: 30.6 % — AB (ref 40.0–52.0)
Hemoglobin: 10.4 g/dL — ABNORMAL LOW (ref 13.0–18.0)
Lymphocytes Relative: 27 %
Lymphs Abs: 2.2 10*3/uL (ref 1.0–3.6)
MCH: 33 pg (ref 26.0–34.0)
MCHC: 33.9 g/dL (ref 32.0–36.0)
MCV: 97.2 fL (ref 80.0–100.0)
MONOS PCT: 12 %
Monocytes Absolute: 1 10*3/uL (ref 0.2–1.0)
NEUTROS ABS: 4.6 10*3/uL (ref 1.4–6.5)
Neutrophils Relative %: 55 %
Platelets: 411 10*3/uL (ref 150–440)
RBC: 3.15 MIL/uL — ABNORMAL LOW (ref 4.40–5.90)
RDW: 25 % — ABNORMAL HIGH (ref 11.5–14.5)
WBC: 8.4 10*3/uL (ref 3.8–10.6)

## 2017-04-25 LAB — IRON AND TIBC
Iron: 86 ug/dL (ref 45–182)
SATURATION RATIOS: 36 % (ref 17.9–39.5)
TIBC: 237 ug/dL — ABNORMAL LOW (ref 250–450)
UIBC: 151 ug/dL

## 2017-04-25 LAB — COMPREHENSIVE METABOLIC PANEL
ALBUMIN: 4.1 g/dL (ref 3.5–5.0)
ALK PHOS: 56 U/L (ref 38–126)
ALT: 14 U/L — ABNORMAL LOW (ref 17–63)
AST: 22 U/L (ref 15–41)
Anion gap: 8 (ref 5–15)
BILIRUBIN TOTAL: 1.4 mg/dL — AB (ref 0.3–1.2)
BUN: 26 mg/dL — AB (ref 6–20)
CALCIUM: 9.3 mg/dL (ref 8.9–10.3)
CO2: 27 mmol/L (ref 22–32)
Chloride: 106 mmol/L (ref 101–111)
Creatinine, Ser: 1.04 mg/dL (ref 0.61–1.24)
GFR calc Af Amer: >60 mL/min (ref >60)
GFR calc non Af Amer: 59 mL/min — ABNORMAL LOW (ref >60)
GLUCOSE: 116 mg/dL — AB (ref 65–99)
Potassium: 3.6 mmol/L (ref 3.5–5.1)
Sodium: 141 mmol/L (ref 135–145)
TOTAL PROTEIN: 6.5 g/dL (ref 6.5–8.1)

## 2017-04-25 LAB — FERRITIN: Ferritin: 1021 ng/mL — ABNORMAL HIGH (ref 24–336)

## 2017-04-25 NOTE — Progress Notes (Signed)
[place] OFFICE PROGRESS NOTE  Patient Care Team: [name], MD as PCP - General (Family Medicine)   SUMMARY OF HEMATOLOGIC HISTORY:  # Anemia sec to ? CKD  Macrocytic ? MDS- no BMBx] on Procrit 20000 Units q ? 75M  # ? Hemochromatosis- heterozygous [ferritin 800-900s] - on surveillance.   INTERVAL HISTORY:   A very pleasant 84 year old male patient who is fairly active for his age  Currently on procrit for anemia secondary to CKD is here for follow-up.  Last Procrit was approximately May 2018.  Patient denies any significant fatigue. Denies any chest pain or shortness of breath or cough. Appetite is good.   REVIEW OF SYSTEMS:  A complete 10 point review of system is done which is negative except mentioned above/history of present illness.   PAST MEDICAL HISTORY :  Past Medical History:  Diagnosis Date  . Anemia   . Atrial fibrillation ([place])   . Cancer [facility])    Testicular  . Hemochromatosis 10/25/2014  . Hypertension   . Prostate cancer ([place])     PAST SURGICAL HISTORY :   Past Surgical History:  Procedure Laterality Date  . APPENDECTOMY    . CHOLECYSTECTOMY    . HAND SURGERY Bilateral   . INNER EAR SURGERY    . KYPHOPLASTY N/A 12/14/2015   Procedure: KYPHOPLASTY  L2;  Surgeon: Hessie Knows, MD;  Location: [facility] ORS;  Service: Orthopedics;  Laterality: N/A;  . PROSTATE SURGERY    . SURGERY SCROTAL / TESTICULAR Right     FAMILY HISTORY :   Family History  Problem Relation Age of Onset  . Hypertension Mother     SOCIAL HISTORY:   Social History   Tobacco Use  . Smoking status: Former Smoker    Types: Cigarettes    Last attempt to quit: 03/22/1958    Years since quitting: 59.1  . Smokeless tobacco: Never Used  Substance Use Topics  . Alcohol use: No  . Drug use: No    ALLERGIES:  is allergic to levofloxacin.  MEDICATIONS:  Current Outpatient Medications  Medication Sig Dispense Refill  . aspirin EC 81 MG tablet Take 81 mg by mouth  once.     [name] b complex vitamins capsule Take 1 capsule by mouth daily.     [name] CALCIUM-MAGNESIUM-ZINC PO Take 1 capsule by mouth daily.     Mariane Baumgarten Calcium (STOOL SOFTENER PO) Take 1 capsule by mouth 2 (two) times daily.     [name] lisinopril-hydrochlorothiazide (PRINZIDE,ZESTORETIC) 10-12.5 MG tablet Take 1 tablet by mouth daily.     . Multiple Vitamins-Minerals (CENTRUM SILVER ADULT 50+) TABS Take by mouth.    . Potassium 99 MG TABS Take 1 tablet by mouth daily.     . Vitamin D, Cholecalciferol, 1000 units CAPS Take 1,000 Units by mouth daily.     No current facility-administered medications for this visit.     PHYSICAL EXAMINATION:   BP 124/78   Pulse 98   Temp 97.9 F (36.6 C) (Tympanic)   Resp 20   There were no vitals filed for this visit.  GENERAL: Well-nourished well-developed; Alert, no distress and comfortable. He is accompanied by his daughter.  He walks with a rolling walker. EYES: no pallor or icterus OROPHARYNX: no thrush or ulceration; good dentition  NECK: supple, no masses felt LYMPH:  no palpable lymphadenopathy in the cervical, axillary or inguinal regions LUNGS: clear to auscultation and  No wheeze or crackles HEART/CVS: regular rate & irregular rhythm  and no murmurs; No lower extremity edema ABDOMEN:abdomen soft, non-tender and normal bowel sounds Musculoskeletal:no cyanosis of digits and no clubbing  PSYCH: alert & oriented x 3 with fluent speech NEURO: no focal motor/sensory deficits SKIN:  no rashes or significant lesions  LABORATORY DATA:  I have reviewed the data as listed    Component Value Date/Time   NA 141 04/25/2017 1350   NA 142 05/06/2013 0158   K 3.6 04/25/2017 1350   K 3.7 05/06/2013 0158   CL 106 04/25/2017 1350   CL 112 (H) 05/06/2013 0158   CO2 27 04/25/2017 1350   CO2 24 05/06/2013 0158   GLUCOSE 116 (H) 04/25/2017 1350   GLUCOSE 95 05/06/2013 0158   BUN 26 (H) 04/25/2017 1350   BUN 32 (H) 05/06/2013 0158   CREATININE 1.04  04/25/2017 1350   CREATININE 1.22 11/25/2013 1332   CALCIUM 9.3 04/25/2017 1350   CALCIUM 9.1 05/06/2013 0158   PROT 6.5 04/25/2017 1350   PROT 6.1 (L) 05/06/2013 0158   ALBUMIN 4.1 04/25/2017 1350   ALBUMIN 3.5 05/06/2013 0158   AST 22 04/25/2017 1350   AST 22 05/06/2013 0158   ALT 14 (L) 04/25/2017 1350   ALT 18 05/06/2013 0158   ALKPHOS 56 04/25/2017 1350   ALKPHOS 60 05/06/2013 0158   BILITOT 1.4 (H) 04/25/2017 1350   BILITOT 1.4 (H) 05/06/2013 0158   GFRNONAA 59 (L) 04/25/2017 1350   GFRNONAA 52 (L) 11/25/2013 1332   GFRAA >60 04/25/2017 1350   GFRAA >60 11/25/2013 1332    No results found for: SPEP, UPEP  Lab Results  Component Value Date   WBC 8.4 04/25/2017   NEUTROABS 4.6 04/25/2017   HGB 10.4 (L) 04/25/2017   HCT 30.6 (L) 04/25/2017   MCV 97.2 04/25/2017   PLT 411 04/25/2017      Chemistry      Component Value Date/Time   NA 141 04/25/2017 1350   NA 142 05/06/2013 0158   K 3.6 04/25/2017 1350   K 3.7 05/06/2013 0158   CL 106 04/25/2017 1350   CL 112 (H) 05/06/2013 0158   CO2 27 04/25/2017 1350   CO2 24 05/06/2013 0158   BUN 26 (H) 04/25/2017 1350   BUN 32 (H) 05/06/2013 0158   CREATININE 1.04 04/25/2017 1350   CREATININE 1.22 11/25/2013 1332      Component Value Date/Time   CALCIUM 9.3 04/25/2017 1350   CALCIUM 9.1 05/06/2013 0158   ALKPHOS 56 04/25/2017 1350   ALKPHOS 60 05/06/2013 0158   AST 22 04/25/2017 1350   AST 22 05/06/2013 0158   ALT 14 (L) 04/25/2017 1350   ALT 18 05/06/2013 0158   BILITOT 1.4 (H) 04/25/2017 1350   BILITOT 1.4 (H) 05/06/2013 0158       RADIOGRAPHIC STUDIES: I have personally reviewed the radiological images as listed and agreed with the findings in the report. No results found.   ASSESSMENT & PLAN:   MDS (myelodysplastic syndrome), low grade (HCC) #  Anemia ?  Secondary to MDS versus CKD [no previous BMBx]- on Procrit 20,000 units;  Last given in May 2018.  Today hemoglobin is 10.5.  Asymptomatic.   Intermittent Aranesp.  # Hemochromatosis- Heterozygous- Ferrtin Feb 2018-1263. No end organ damage. Discussed that I would not recommend phlebotomy given his anemia and both the patient and daughter are okay with this.  #  Check CBC/aranesp- 3 months;  Follow-up with MD in 6 months with labs/ possible labs/cbc/bmp/iron studies-/aranesp.  Cammie Sickle, MD 04/26/2017 8:57 AM

## 2017-04-25 NOTE — Assessment & Plan Note (Addendum)
#    Anemia ?  Secondary to MDS versus CKD [no previous BMBx]- on Procrit 20,000 units;  Last given in May 2018.  Today hemoglobin is 10.5.  Asymptomatic.  Intermittent Aranesp.  # Hemochromatosis- Heterozygous- Ferrtin Feb 2018-1263. No end organ damage. Discussed that I would not recommend phlebotomy given his anemia and both the patient and daughter are okay with this.  #  Check CBC/aranesp- 3 months;  Follow-up with MD in 6 months with labs/ possible labs/cbc/bmp/iron studies-/aranesp.

## 2017-07-18 ENCOUNTER — Other Ambulatory Visit: Payer: Self-pay

## 2017-07-18 ENCOUNTER — Encounter: Payer: Self-pay | Admitting: Physical Therapy

## 2017-07-18 ENCOUNTER — Ambulatory Visit: Payer: Medicare Other | Attending: Family Medicine | Admitting: Physical Therapy

## 2017-07-18 VITALS — BP 122/73

## 2017-07-18 DIAGNOSIS — R42 Dizziness and giddiness: Secondary | ICD-10-CM | POA: Insufficient documentation

## 2017-07-18 DIAGNOSIS — M6281 Muscle weakness (generalized): Secondary | ICD-10-CM | POA: Insufficient documentation

## 2017-07-18 DIAGNOSIS — R262 Difficulty in walking, not elsewhere classified: Secondary | ICD-10-CM | POA: Insufficient documentation

## 2017-07-18 NOTE — Therapy (Signed)
[place] MAIN [facility] SERVICES [address] [place], [place], [address] Phone: [phone]   Fax:  [phone]  Physical Therapy Evaluation  Patient Details  Name: Gerald [name] MRN: [id number] Date of Birth: [birth year] No data recorded  Encounter Date: 07/18/2017  PT End of Session - 07/18/17 1022    Visit Number  1    Number of Visits  9    Date for PT Re-Evaluation  09/12/17    PT Start Time  1022    PT Stop Time  1130    PT Time Calculation (min)  68 min    Equipment Utilized During Treatment  Gait belt    Activity Tolerance  Patient limited by fatigue    Behavior During Therapy  WFL for tasks assessed/performed       Past Medical History:  Diagnosis Date  . Anemia   . Atrial fibrillation ([place])   . Cancer [facility])    Testicular  . Hemochromatosis 10/25/2014  . Hypertension   . Prostate cancer [facility])     Past Surgical History:  Procedure Laterality Date  . APPENDECTOMY    . CHOLECYSTECTOMY    . HAND SURGERY Bilateral   . INNER EAR SURGERY    . KYPHOPLASTY N/A 12/14/2015   Procedure: KYPHOPLASTY  L2;  Surgeon: Hessie Knows, MD;  Location: [facility] ORS;  Service: Orthopedics;  Laterality: N/A;  . PROSTATE SURGERY    . SURGERY SCROTAL / TESTICULAR Right     Vitals:   07/18/17 1106 07/18/17 1107 07/18/17 1109  BP: 118/70 102/68 122/73     Subjective Assessment - 07/23/17 2035    Subjective  Patient reports that he is having 2/10 dizziness at rest. Patient states he got fatigued from walking to the clinic.     Pertinent History  Patient reports his dizziness first started about 3 or 4 years ago after he had pneumonia. Patient reports the dizziness has been fairly constant since then, but states sometimes it is worse than others. Patient reports problems with balance when he walks and feels pressure in his head. Patient states he can concentrate on something and do what he needs to do, but it takes effort. Patient reports that  several years ago he saw an ENT physician and describes having had a VNG test which patient reports was normal. Denies having had a brain MRI or CT scan in last few years. Patient has a history of A-fib and is not on blood thinners per daughter's report because they were concerned about complications of blood thinners and fall concerns. Patient reports his dizziness as vertigo "a little bit", unsteadiness, lightheadedness, falling, whoozy, and swimmy-headed sensation. Patient reports symptoms are constant, motion provoked, positional and constant. Patient reports that head turning, sitting up in bed and sudden moves increase his dizziness and nothing eases his dizziness.     Diagnostic tests  reports he had possible VNG test several years ago which was normal per patient's report    Patient Stated Goals  patient would like to be able to improve his walking, be able to mow the lawn and to do "any kind of work"    Currently in Pain?  No/denies        [facility] PT Assessment - 07/23/17 1959      Assessment   Medical Diagnosis  vertigo    Referring Provider  Dr. Kary Kos    Prior Therapy  no prior vestibular therapy      Precautions   Precautions  Fall      Restrictions   Weight Bearing Restrictions  No      Balance Screen   Has the patient fallen in the past 6 months  No    Has the patient had a decrease in activity level because of a fear of falling?   No    Is the patient reluctant to leave their home because of a fear of falling?   No      Home Social worker  Private residence    Living Arrangements  Children    Available Help at Discharge  Family;Friend(s)    Type of [place] to enter    Entrance Stairs-Number of Steps  4    Entrance Stairs-Rails  None    Home Layout  Two level;Able to live on main level with bedroom/bathroom    Alternate Level Stairs-Number of Steps  1 flight    Alternate Level Stairs-Rails  Left;Right    Comfort - single point;Walker - 4 wheels      Prior Function   Level of Independence  Independent with community mobility with device limited community ambulator with AD    Vocation  Retired      Associate Professor   Overall Cognitive Status  Within Functional Limits for tasks assessed    Attention  Focused      Standardized Balance Assessment   Standardized Balance Assessment  Dynamic Gait Index      Dynamic Gait Index   Level Surface  Mild Impairment    Change in Gait Speed  Mild Impairment    Gait with Horizontal Head Turns  Mild Impairment    Gait with Vertical Head Turns  Mild Impairment    Gait and Pivot Turn  Normal    Step Over Obstacle  Mild Impairment    Step Around Obstacles  Normal    Steps  Mild Impairment    Total Score  18         VESTIBULAR AND BALANCE EVALUATION   HISTORY:  Subjective history of current problem: Patient reports his dizziness first started about 3 or 4 years ago after he had pneumonia. Patient reports the dizziness has been fairly constant since then, but states sometimes it is worse than others. Patient reports problems with balance when he walks and feels pressure in his head. Patient states he can concentrate on something and do what he needs to do, but it takes effort. Patient reports that several years ago he saw an ENT physician and describes having had a VNG test which patient reports was normal. Denies having had a brain MRI or CT scan in last few years. Patient has a history of A-fib and is not on blood thinners per daughter's report because they were concerned about complications of blood thinners and fall concerns. Patient reports his dizziness as vertigo "a little bit", unsteadiness, lightheadedness, falling, whoozy, and swimmy-headed sensation. Patient reports symptoms are constant, motion provoked, positional and constant. Patient reports that head turning, sitting up in bed and sudden moves increase his dizziness and nothing eases his dizziness.    Description of dizziness: vertigo- "a little bit", unsteadiness, lightheadedness, falling, whoozy, swimmy-headed sensation Frequency: daily Duration: constant Symptom nature: motion provoked, positional, constant  Provocative Factors: head turning, sitting up in bed and sudden moves Easing Factors: nothing helps   Progression of symptoms: little bit worse  Falls (yes/no): no Number of falls in past 6  months: no falls, one near miss  Prior Functional Level: Patient uses a SPC in the community and a rollator walker at home. Lives in a split level home and has 7 stairs with right rail to get to his bedroom. Patient lives with his dtr and her family. Patient has 6 dtrs and 1 son.  Auditory complaints (tinnitus, pain, drainage): denies; patient had perforated ear drum and bad ear infection that kept him out of the service. Patient has had a few surgeries on the right ear to remove a growth/abscess twice. Reports occasional tinnitus in both ears; reports very hard of hearing in the right ear, left ear "is fine" Vision (last eye exam, diplopia, recent changes): patient has glasses; history of cataract surgery  Current Symptoms: (dysarthria, dysphagia, drop attacks, bowel and bladder changes, recent weight loss/gain)    Review of systems negative for red flags.  Reports he has had a 50 pound weight loss in the past year reports his appetite is good but that he is not as mobile and so he has to wait until someone serves him food. Discussed nutritional supplement drinks like Boost and recommended that he talk with his physician in regards to this issue.   EXAMINATION  Note: patient arrives to clinic reporting 2/10 dizziness at rest.   NEUROLOGICAL SCREEN: (2+ unless otherwise noted.) N=normal  Ab=abnormal  Level Dermatome R L Myotome R L  C3 Anterior Neck N N Sidebend C2-3    C4 Top of Shoulder N N Shoulder Shrug C4    C5 Lateral Upper Arm N N Shoulder ABD C4-5    C6 Lateral Arm/ Thumb N N  Arm Flex/ Wrist Ext C5-6    C7 Middle Finger N N Arm Ext//Wrist Flex C6-7    C8 4th & 5th Finger N N Flex/ Ext Carpi Ulnaris C8    T1 Medial Arm N N Interossei T1    L2 Medial thigh/groin N N Illiopsoas (L2-3) N N  L3 Lower thigh/med.knee N N Quadriceps (L3-4) N N  L4 Medial leg/lat thigh N N Tibialis Ant (L4-5)    L5 Lat. leg & dorsal foot N N EHL (L5)    S1 post/lat foot/thigh/leg N N Gastrocnemius (S1-2)    S2 Post./med. thigh & leg N N Hamstrings (L4-S3) N N    SOMATOSENSORY:         Sensation           Intact      Diminished         Absent  Light touch B UEs & LEs     Any N & T in extremities or weakness: slight N & T in feet and some N & T in bilateral lower leg feels a little numb.       COORDINATION: Finger to Nose:  Normal R finger to nose;  Minimal difficulty with left hand finger to nose good speed but slight pass pointing Past Pointing:   Left     MUSCULOSKELETAL SCREEN: Cervical Spine ROM: Cervical AROM right rotation WNL and left rotation WFL but limited to grossly 0-45 degrees; flexion and extension WFL without pain  MMT:  Grossly 4/5 hip abduction and adduction and +3/5 hip flexors bilaterally -5/5 quads and hams and 5/5 DF bilateral LEs  Functional Mobility: Patient performs sit to stand with S and supine to from sitting independently. Patient with mild imbalance noted at times upon standing.  Patient reports 4/10 dizziness upon sitting with supine to sitting.   Gait: Patient arrives  to clinic ambulating with SPC. Patient ambulates with fair cadence with step through gait pattern.  Scanning of visual environment with gait is: fair  Balance: Patient demonstrates difficulty with ambulation with head turns, narrow BOS and uneven surfaces.   POSTURAL CONTROL TESTS:   Clinical Test of Sensory Interaction for Balance (CTSIB): To be assessed next visit   OCULOMOTOR / VESTIBULAR TESTING:  Oculomotor Exam- Room Light  Normal Abnormal Comments  Ocular  Alignment N    Ocular ROM N    Spontaneous Nystagmus N    End-Gaze Nystagmus N  Age appropriate  Smooth Pursuit  Abn Saccadic movements noted  Saccades  Abn Several hypometric saccades with left field gaze  VOR  Abn   VOR Cancellation N     Left Head Thrust   Consider testing next session  Right Head Thrust   Consider testing next session  Head Shaking Nystagmus   Consider testing next session     BPPV TESTS:  Symptoms Duration Intensity Nystagmus  L Dix-Hallpike denies   None   R Dix-Hallpike denies   none  L Head Roll denies   none  R Head Roll denies   none    FUNCTIONAL OUTCOME MEASURES:  Results Comments  DHI 66/100 Moderate perception of handicap; in need of intervention  ABC Scale 51.25% Falls risk; in need of intervention  DGI /24 Falls risk; in need of intervention    Neuromuscular Re-education:  VOR X 1 exercise:  Demonstrated and educated as to VOR X1.  Patient performed VOR X 1 horizontal in sitting 1 rep of 30 seconds and 2 reps of 1 minute each with verbal cues for technique.  Patient reports mild dizziness.  Issued for HEP.     PT Education - 07/18/17 1022    Education provided  Yes    Education Details  discussed plan of care; issued VOR x 1 for HEP    Person(s) Educated  Patient;Child(ren) dtr    Methods  Explanation;Demonstration;Handout    Comprehension  Verbalized understanding;Returned demonstration       PT Short Term Goals - 07/18/17 1747      PT SHORT TERM GOAL #1   Title  Patient will be independent with home exercise program for self management.     Time  4    Period  Weeks    Status  New    Target Date  08/15/17        PT Long Term Goals - 07/23/17 2025      PT LONG TERM GOAL #1   Title  Patient will reduce falls risk as indicated by Activities Specific Balance Confidence Scale (ABC) >67%.    Baseline  Scored 51.25% on 07/18/17.    Time  8    Period  Weeks    Status  New    Target Date  09/12/17      PT LONG TERM GOAL #2    Title  Patient will reduce perceived disability to low levels as indicated by <40 on Dizziness Handicap Inventory.    Baseline  scored 66/100 on 07/18/17.    Time  8    Period  Weeks    Status  New    Target Date  09/12/17      PT LONG TERM GOAL #3   Title  Patient will report 50% or greater improvement in his symptoms of dizziness and imbalance with provoking motions or positions by    Time  8    Period  Weeks    Status  New    Target Date  09/12/17      PT LONG TERM GOAL #4   Title  Patient will demonstrate reduced falls risk as evidenced by Dynamic Gait Index (DGI) >20/24.    Baseline  scored 18/24 on 07/18/17    Time  8    Period  Weeks    Status  New    Target Date  09/12/17             Plan - 07/23/17 1953    Clinical Impression Statement  Patient presents with potential indicators of central and peripheral signs with vestibular testing as noted saccadic eye movements with smooth pursuit testing and hypometric saccades as well as abnormal VOR. Patient with negative canal testing for BPPV this date. Patient presents with decreased balance and decreased strength bilateral LEs. Patient would benefit from PT services to address functional deficits, to address goals, to try to reduce falls risk and to reduce symptoms of dizziness and imbalance.     History and Personal Factors relevant to plan of care:  age and comorbidities-arthritis, afib, MI, testicular and prostate cancer, anemia, MDS and HTN    Clinical Presentation  Evolving    Clinical Decision Making  Moderate    Rehab Potential  Fair    Clinical Impairments Affecting Rehab Potential  positive indicators: family support, motivated   negative indicators: chronicity, age, co-morbidities    PT Frequency  2x / week    PT Duration  8 weeks    PT Treatment/Interventions  Canalith Repostioning;Gait training;Stair training;Functional mobility training;Neuromuscular re-education;Balance training;Therapeutic exercise;Therapeutic  activities;Patient/family education;Vestibular    PT Next Visit Plan  attempt head thurst, DVA and head shaking nystagmus testing if patient able tolerate; review VOR, beginning balance, strengthening and vestibular exercises and issue additional exercises for HEP    PT Home Exercise Plan  VOR X 1 1 minute reps in sitting    Consulted and Agree with Plan of Care  Patient;Family member/caregiver    Family Member Consulted  daughter       Patient will benefit from skilled therapeutic intervention in order to improve the following deficits and impairments:  Decreased balance, Decreased endurance, Decreased mobility, Difficulty walking, Dizziness, Decreased activity tolerance, Decreased strength, Postural dysfunction, Pain  Visit Diagnosis: Dizziness and giddiness  Muscle weakness (generalized)  Difficulty in walking, not elsewhere classified     Problem List Patient Active Problem List   Diagnosis Date Noted  . MDS (myelodysplastic syndrome), low grade ([place]) 10/18/2016  . Anemia of chronic disease 10/21/2015  . Hemochromatosis 10/25/2014  . BP (high blood pressure) 10/21/2014  . CA of prostate ([place]) 10/21/2014  . Cancer of testicle ([place]) 10/21/2014  . Anemia 08/24/2014  . Dizziness 09/14/2013  . Difficulty in walking 09/14/2013  . 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 A-fib ([place]) 06/24/2013  . MI (mitral incompetence) 06/24/2013  . Absolute anemia 06/23/2013  . Arthritis, degenerative 06/23/2013  . Essential (primary) hypertension 06/23/2013   Lady Deutscher PT, DPT [phone] Lady Deutscher 07/18/2017, 5:48 PM  [place] MAIN [facility] SERVICES [address] [place], [place], [address] Phone: [phone]   Fax:  [phone]  Name: Gerald [name] MRN: [id number] Date of Birth: 02/10/1921

## 2017-07-25 ENCOUNTER — Inpatient Hospital Stay: Payer: Medicare Other | Attending: Internal Medicine

## 2017-07-25 ENCOUNTER — Inpatient Hospital Stay: Payer: Medicare Other

## 2017-07-25 DIAGNOSIS — D469 Myelodysplastic syndrome, unspecified: Secondary | ICD-10-CM | POA: Diagnosis not present

## 2017-07-25 DIAGNOSIS — D638 Anemia in other chronic diseases classified elsewhere: Secondary | ICD-10-CM

## 2017-07-25 LAB — CBC WITH DIFFERENTIAL/PLATELET
BASOS PCT: 2 %
Basophils Absolute: 0.2 10*3/uL — ABNORMAL HIGH (ref 0–0.1)
EOS ABS: 0.4 10*3/uL (ref 0–0.7)
Eosinophils Relative: 5 %
HEMATOCRIT: 29.5 % — AB (ref 40.0–52.0)
HEMOGLOBIN: 10.1 g/dL — AB (ref 13.0–18.0)
LYMPHS ABS: 2.4 10*3/uL (ref 1.0–3.6)
Lymphocytes Relative: 29 %
MCH: 35.3 pg — ABNORMAL HIGH (ref 26.0–34.0)
MCHC: 34.4 g/dL (ref 32.0–36.0)
MCV: 102.6 fL — ABNORMAL HIGH (ref 80.0–100.0)
Monocytes Absolute: 1 10*3/uL (ref 0.2–1.0)
Monocytes Relative: 12 %
NEUTROS ABS: 4.2 10*3/uL (ref 1.4–6.5)
NEUTROS PCT: 52 %
Platelets: 425 10*3/uL (ref 150–440)
RBC: 2.87 MIL/uL — AB (ref 4.40–5.90)
RDW: 25.9 % — ABNORMAL HIGH (ref 11.5–14.5)
WBC: 8.2 10*3/uL (ref 3.8–10.6)

## 2017-07-29 ENCOUNTER — Encounter: Payer: Self-pay | Admitting: Physical Therapy

## 2017-07-29 ENCOUNTER — Ambulatory Visit: Payer: Medicare Other | Admitting: Physical Therapy

## 2017-07-29 VITALS — BP 114/67 | HR 83

## 2017-07-29 DIAGNOSIS — M6281 Muscle weakness (generalized): Secondary | ICD-10-CM

## 2017-07-29 DIAGNOSIS — R42 Dizziness and giddiness: Secondary | ICD-10-CM | POA: Diagnosis not present

## 2017-07-29 DIAGNOSIS — R262 Difficulty in walking, not elsewhere classified: Secondary | ICD-10-CM

## 2017-07-29 NOTE — Therapy (Signed)
[place] MAIN [facility] SERVICES [address] [place], [place], [address] Phone: [phone]   Fax:  [phone]  Physical Therapy Treatment  Patient Details  Name: Gerald [name] MRN: [id number] Date of Birth: 20-Apr-1920 Referring Provider: Dr. Kary Kos   Encounter Date: 07/29/2017  PT End of Session - 07/29/17 0902    Visit Number  2    Number of Visits  17    Date for PT Re-Evaluation  09/12/17    PT Start Time  0859    PT Stop Time  0950    PT Time Calculation (min)  51 min    Equipment Utilized During Treatment  Gait belt    Activity Tolerance  Patient tolerated treatment well    Behavior During Therapy  [facility] for tasks assessed/performed       Past Medical History:  Diagnosis Date  . Anemia   . Atrial fibrillation ([place])   . Cancer [facility])    Testicular  . Hemochromatosis 10/25/2014  . Hypertension   . Prostate cancer [facility])     Past Surgical History:  Procedure Laterality Date  . APPENDECTOMY    . CHOLECYSTECTOMY    . HAND SURGERY Bilateral   . INNER EAR SURGERY    . KYPHOPLASTY N/A 12/14/2015   Procedure: KYPHOPLASTY  L2;  Surgeon: Hessie Knows, MD;  Location: [facility] ORS;  Service: Orthopedics;  Laterality: N/A;  . PROSTATE SURGERY    . SURGERY SCROTAL / TESTICULAR Right     Vitals:   07/30/17 1637  BP: 114/67  Pulse: 83  SpO2: 98%    Subjective Assessment - 07/29/17 0901    Subjective  Patient reports that he slept well last night but woke up with dizziness and states his dizziness is 6-7/10 today upon arrival.     Pertinent History  Patient reports his dizziness first started about 3 or 4 years ago after he had pneumonia. Patient reports the dizziness has been fairly constant since then, but states sometimes it is worse than others. Patient reports problems with balance when he walks and feels pressure in his head. Patient states he can concentrate on something and do what he needs to do, but it takes effort. Patient  reports that several years ago he saw an ENT physician and describes having had a VNG test which patient reports was normal. Denies having had a brain MRI or CT scan in last few years. Patient has a history of A-fib and is not on blood thinners per daughter's report because they were concerned about complications of blood thinners and fall concerns. Patient reports his dizziness as vertigo "a little bit", unsteadiness, lightheadedness, falling, whoozy, and swimmy-headed sensation. Patient reports symptoms are constant, motion provoked, positional and constant. Patient reports that head turning, sitting up in bed and sudden moves increase his dizziness and nothing eases his dizziness.     Diagnostic tests  reports he had possible VNG test several years ago which was normal per patient's report    Patient Stated Goals  patient would like to be able to improve his walking, be able to mow the lawn and to do "any kind of work"    Currently in Pain?  Yes    Pain Score  3     Pain Location  Back    Pain Descriptors / Indicators  Aching    Pain Type  Chronic pain patient reports pain from arthritis       Neuromuscular Re-education:  VOR X 1  exercise:  Patient performed 1 rep of 1 minute VOR X 1 in sitting with plain background. Patient reported no increase in dizziness. Patient performed VOR X 1 horizontal in sitting with conflicting background 2 reps of 1 minute each.  Patient reported some shifting of the target with right head turning during VOR with conflicting background.  Patient reports 5-6/10 dizziness with this activity.   Semi-Tandem Progressions exercise:  On firm surface, patient performed semi-tandem progressions with alternating lead leg with and without body turns and horizontal and vertical head turns.  Performed multiple 30 second reps of each.   Feet together exercise: On firm surface, patient performed feet together progressions (progressed to feet about 1/2" apart) with and without  body turns and horizontal and vertical head turns.  Performed multiple 30 second reps of each.   Mini-squats: Patient performed 8 reps mini-squats with 5 second holds with verbal cues to maintain upright posture but patient reports increased bilateral knee discomfort and fatigue. Patient had sitting rest break after this exercise. Patient reports discomfort in bilateral knees therefore stopped.   Slow Marching: Patient performed on firm surface, slow marching with eyes open with 3 second holds with CGA and patient progressed from bilateral UEs support to one hand UE support to 2 fingers of one hand. Patient performed 10 reps with verbal cuing for technique and posture.   Sit to Stands:  Patient educated as to proper form with sit to stand. Patient performed 8 reps sit to stand with use of one UE as patient unable to perform without UE support with verbal cuing for sequencing and to control eccentric descent with CGA.   Discussed safety precautions with performing HEP at home.  Demonstrated and discussed standing in corner with chair in front for safety and then patient demonstrated back.     PT Education - 07/29/17 0901    Education provided  Yes    Education Details  reviewed VOR and added additional exercises for HEP- slides1,3,4,5,7 and 13    Person(s) Educated  Patient;Child(ren)    Methods  Explanation;Demonstration;Verbal cues;Handout    Comprehension  Verbalized understanding;Returned demonstration       PT Short Term Goals - 07/23/17 2025      PT SHORT TERM GOAL #1   Title  Patient will be independent with home exercise program for self management.     Time  4    Period  Weeks    Status  New        PT Long Term Goals - 07/23/17 2025      PT LONG TERM GOAL #1   Title  Patient will reduce falls risk as indicated by Activities Specific Balance Confidence Scale (ABC) >67%.    Baseline  Scored 51.25% on 07/18/17.    Time  8    Period  Weeks    Status  New    Target  Date  09/12/17      PT LONG TERM GOAL #2   Title  Patient will reduce perceived disability to low levels as indicated by <40 on Dizziness Handicap Inventory.    Baseline  scored 66/100 on 07/18/17.    Time  8    Period  Weeks    Status  New    Target Date  09/12/17      PT LONG TERM GOAL #3   Title  Patient will report 50% or greater improvement in his symptoms of dizziness and imbalance with provoking motions or positions by  Time  8    Period  Weeks    Status  New    Target Date  09/12/17      PT LONG TERM GOAL #4   Title  Patient will demonstrate reduced falls risk as evidenced by Dynamic Gait Index (DGI) >20/24.    Baseline  scored 18/24 on 07/18/17    Time  8    Period  Weeks    Status  New    Target Date  09/12/17            Plan - 07/31/17 1329    Clinical Impression Statement  Patient able to progress to VOR X 1 with conflicting background this date. Patient had difficulty performing mini-squats secondary to bilateral knee discomfort, but patient was able to perform sit to stand exercise with use of 1 UE. Patient would benefit from LEs strengthening exercises per his tolerance. Patient challenged by narrow base of support and single leg stance activities this date especially when combined with head turning. Patient would benefit from continued PT services to further address deficits and goals.     Rehab Potential  Fair    Clinical Impairments Affecting Rehab Potential  positive indicators: family support, motivated   negative indicators: chronicity, age, co-morbidities    PT Frequency  2x / week    PT Duration  8 weeks    PT Treatment/Interventions  Canalith Repostioning;Gait training;Stair training;Functional mobility training;Neuromuscular re-education;Balance training;Therapeutic exercise;Therapeutic activities;Patient/family education;Vestibular    PT Next Visit Plan  attempt head thurst and DVA  testing if patient able tolerate; review HEP, balance, strengthening and  vestibular exercises, consider Nustep warmup    PT Home Exercise Plan  VOR X 1 1 minute reps in sitting with conflicting background, feet together and semi-tandem stance progressions with horizontal and vertical head turns and body turns and slow marching with one UE support and sit to stand with one UE support    Consulted and Agree with Plan of Care  Patient;Family member/caregiver    Family Member Consulted  daughter       Patient will benefit from skilled therapeutic intervention in order to improve the following deficits and impairments:  Decreased balance, Decreased endurance, Decreased mobility, Difficulty walking, Dizziness, Decreased activity tolerance, Decreased strength, Postural dysfunction, Pain  Visit Diagnosis: Dizziness and giddiness  Muscle weakness (generalized)  Difficulty in walking, not elsewhere classified     Problem List Patient Active Problem List   Diagnosis Date Noted  . MDS (myelodysplastic syndrome), low grade ([place]) 10/18/2016  . Anemia of chronic disease 10/21/2015  . Hemochromatosis 10/25/2014  . BP (high blood pressure) 10/21/2014  . CA of prostate ([place]) 10/21/2014  . Cancer of testicle ([place]) 10/21/2014  . Anemia 08/24/2014  . Dizziness 09/14/2013  . Difficulty in walking 09/14/2013  . 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 A-fib ([place]) 06/24/2013  . MI (mitral incompetence) 06/24/2013  . Absolute anemia 06/23/2013  . Arthritis, degenerative 06/23/2013  . Essential (primary) hypertension 06/23/2013   Lady Deutscher PT, DPT [phone] Lady Deutscher 07/30/2017, 4:37 PM  [place] MAIN [facility] SERVICES [address] [place], [place], [address] Phone: [phone]   Fax:  [phone]  Name: Gerald [name] MRN: [id number] Date of Birth: 05/14/1920

## 2017-08-01 ENCOUNTER — Ambulatory Visit: Payer: Medicare Other | Admitting: Physical Therapy

## 2017-08-01 ENCOUNTER — Encounter: Payer: Self-pay | Admitting: Physical Therapy

## 2017-08-01 VITALS — BP 112/67 | HR 89

## 2017-08-01 DIAGNOSIS — R262 Difficulty in walking, not elsewhere classified: Secondary | ICD-10-CM

## 2017-08-01 DIAGNOSIS — R42 Dizziness and giddiness: Secondary | ICD-10-CM | POA: Diagnosis not present

## 2017-08-01 DIAGNOSIS — M6281 Muscle weakness (generalized): Secondary | ICD-10-CM

## 2017-08-01 NOTE — Therapy (Signed)
[place] MAIN [facility] SERVICES [address] [place], [place], [address] Phone: [phone]   Fax:  [phone]  Physical Therapy Treatment  Patient Details  Name: Gerald [name] MRN: [id number] Date of Birth: 11/25/1920 Referring Provider: Dr. Kary Kos   Encounter Date: 08/01/2017  PT End of Session - 08/01/17 1020    Visit Number  3    Number of Visits  17    Date for PT Re-Evaluation  09/12/17    PT Start Time  1016    PT Stop Time  1100    PT Time Calculation (min)  44 min    Equipment Utilized During Treatment  Gait belt    Activity Tolerance  Patient tolerated treatment well    Behavior During Therapy  [facility] for tasks assessed/performed       Past Medical History:  Diagnosis Date  . Anemia   . Atrial fibrillation ([place])   . Cancer [facility])    Testicular  . Hemochromatosis 10/25/2014  . Hypertension   . Prostate cancer [facility])     Past Surgical History:  Procedure Laterality Date  . APPENDECTOMY    . CHOLECYSTECTOMY    . HAND SURGERY Bilateral   . INNER EAR SURGERY    . KYPHOPLASTY N/A 12/14/2015   Procedure: KYPHOPLASTY  L2;  Surgeon: Hessie Knows, MD;  Location: [facility] ORS;  Service: Orthopedics;  Laterality: N/A;  . PROSTATE SURGERY    . SURGERY SCROTAL / TESTICULAR Right     Vitals:   08/01/17 1109  BP: 112/67  Pulse: 89  SpO2: 100%    Subjective Assessment - 08/01/17 1019    Subjective  Patient states he did his exercises yesterday and was a little sore.  Patient states he was a little shaking with balance with the exercises. Patient states the sit to stand exercises were the most challenging.Patient states the sit to stand was tough on the knees. Patient reports no increase in his knee soreness today.  Patient states he also did his daily walking outside on a flat area behind his house.    Pertinent History  Patient reports his dizziness first started about 3 or 4 years ago after he had pneumonia. Patient reports the  dizziness has been fairly constant since then, but states sometimes it is worse than others. Patient reports problems with balance when he walks and feels pressure in his head. Patient states he can concentrate on something and do what he needs to do, but it takes effort. Patient reports that several years ago he saw an ENT physician and describes having had a VNG test which patient reports was normal. Denies having had a brain MRI or CT scan in last few years. Patient has a history of A-fib and is not on blood thinners per daughter's report because they were concerned about complications of blood thinners and fall concerns. Patient reports his dizziness as vertigo "a little bit", unsteadiness, lightheadedness, falling, whoozy, and swimmy-headed sensation. Patient reports symptoms are constant, motion provoked, positional and constant. Patient reports that head turning, sitting up in bed and sudden moves increase his dizziness and nothing eases his dizziness.     Diagnostic tests  reports he had possible VNG test several years ago which was normal per patient's report    Patient Stated Goals  patient would like to be able to improve his walking, be able to mow the lawn and to do "any kind of work"       Patient reports  3-4/10 dizziness upon arrival to clinic.  Therapeutic Exercise: Nustep: Prior to activity, patients HR was 89 bpm, oxygen saturation level was 100% and blood pressure was 112/67 mmHg.  Patient performed Nu-step machine on level 3 for 5 minutes with cuing for pace initially with pace of 60-70 steps per minute.  Patient's vital signs monitored as follows: oxygen saturation level 99%, heart rate 101 bpm.  Four Way Hip exercise: Performed 15 reps hip Abd, hip extension and then bent knee hip extension each leg with #1 ankle weights standing in // bars with bilateral UEs support.  Performed 10 reps hip flexion 1# B LEs ankle weights Patient required short sitting rest break after this  activity.   Neuromuscular Re-education: Airex pad:  On firm surface and then on Airex pad, patient performed feet together progressions (progressed to feet together) and semi-tandem progressions with alternating lead leg with and without body turns and horizontal and vertical head turns multiple 30-60 second reps with verbal cues for posture and technique.  Patient reports increase in imbalance with standing on Airex pad for these activities.   VOR X 1 exercise:  Patient performed VOR X 1 horizontal in sitting 2 reps of 1 minute each with verbal cues for speed with conflicting background.  Patient reports the target is staying in focus with no shifting this time and denies any increase in his dizziness.  Patient performed VOR X 1 horizontal in standing 2 reps of 1 minute each with conflicting background.  Walking while scanning for visual targets: Performed 170' times 2 with sitting rest break after each rep of forwards ambulation while scanning for visual targets (playing cards) in hallway with use of SPC with CGA and verbal cues to stand erect. Patient reports increase in dizziness.   Patient reports fatigue in legs during session and required several short, sitting rest breaks during session. Patient motivated to session and reports that he feels like he did something today.     PT Education - 08/01/17 1020    Education provided  Yes    Education Details  reviewed HEP and added progression of standing on pillow for feet together and semi-tandem exercises    Person(s) Educated  Patient    Methods  Explanation;Demonstration;Verbal cues    Comprehension  Verbalized understanding;Returned demonstration;Verbal cues required       PT Short Term Goals - 07/23/17 2025      PT SHORT TERM GOAL #1   Title  Patient will be independent with home exercise program for self management.     Time  4    Period  Weeks    Status  New        PT Long Term Goals - 07/23/17 2025      PT LONG TERM  GOAL #1   Title  Patient will reduce falls risk as indicated by Activities Specific Balance Confidence Scale (ABC) >67%.    Baseline  Scored 51.25% on 07/18/17.    Time  8    Period  Weeks    Status  New    Target Date  09/12/17      PT LONG TERM GOAL #2   Title  Patient will reduce perceived disability to low levels as indicated by <40 on Dizziness Handicap Inventory.    Baseline  scored 66/100 on 07/18/17.    Time  8    Period  Weeks    Status  New    Target Date  09/12/17      PT  LONG TERM GOAL #3   Title  Patient will report 50% or greater improvement in his symptoms of dizziness and imbalance with provoking motions or positions by    Time  8    Period  Weeks    Status  New    Target Date  09/12/17      PT LONG TERM GOAL #4   Title  Patient will demonstrate reduced falls risk as evidenced by Dynamic Gait Index (DGI) >20/24.    Baseline  scored 18/24 on 07/18/17    Time  8    Period  Weeks    Status  New    Target Date  09/12/17            Plan - 08/01/17 1021    Clinical Impression Statement  Patient reporting compliance with home exercise program and reviewed safety with HEP. Patient able to perform standing strengthening exercises without increase in knee pain this date. Will continue to work on strengthening, balance and vestibular progressions per patient tolerance. Patient did well with ambulation while scanning for targets but reports this did increase his dizziness level and he was challenged by activties on Airex pad. Patient would benefit from continued PT services to try to address goals and reduce falls risk.     Rehab Potential  Fair    Clinical Impairments Affecting Rehab Potential  positive indicators: family support, motivated   negative indicators: chronicity, age, co-morbidities    PT Frequency  2x / week    PT Duration  8 weeks    PT Treatment/Interventions  Canalith Repostioning;Gait training;Stair training;Functional mobility training;Neuromuscular  re-education;Balance training;Therapeutic exercise;Therapeutic activities;Patient/family education;Vestibular    PT Next Visit Plan  NuStep try level 4, 4 way hip with 1 to 2 # ankle weights and progress reps as able, Airex balance pad and sitting ball follows.    PT Home Exercise Plan  VOR X 1 1 minute reps in sitting with conflicting background, feet together and semi-tandem stance progressions with horizontal and vertical head turns and body turns and slow marching with one UE support and sit to stand with one UE support    Consulted and Agree with Plan of Care  Patient;Family member/caregiver    Family Member Consulted  daughter       Patient will benefit from skilled therapeutic intervention in order to improve the following deficits and impairments:  Decreased balance, Decreased endurance, Decreased mobility, Difficulty walking, Dizziness, Decreased activity tolerance, Decreased strength, Postural dysfunction, Pain  Visit Diagnosis: Dizziness and giddiness  Muscle weakness (generalized)  Difficulty in walking, not elsewhere classified     Problem List Patient Active Problem List   Diagnosis Date Noted  . MDS (myelodysplastic syndrome), low grade ([place]) 10/18/2016  . Anemia of chronic disease 10/21/2015  . Hemochromatosis 10/25/2014  . BP (high blood pressure) 10/21/2014  . CA of prostate ([place]) 10/21/2014  . Cancer of testicle ([place]) 10/21/2014  . Anemia 08/24/2014  . Dizziness 09/14/2013  . Difficulty in walking 09/14/2013  . 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 A-fib ([place]) 06/24/2013  . MI (mitral incompetence) 06/24/2013  . Absolute anemia 06/23/2013  . Arthritis, degenerative 06/23/2013  . Essential (primary) hypertension 06/23/2013   Lady Deutscher PT, DPT [phone] Lady Deutscher 08/01/2017, 12:58 PM  [place] MAIN [facility] SERVICES [address] [place], [place], [address] Phone: [phone]   Fax:  [phone]  Name: Gerald [name] MRN: [id number] Date of Birth: 10-Apr-1920

## 2017-08-05 ENCOUNTER — Ambulatory Visit: Payer: Medicare Other | Admitting: Physical Therapy

## 2017-08-05 ENCOUNTER — Encounter: Payer: Self-pay | Admitting: Physical Therapy

## 2017-08-05 VITALS — BP 155/70 | HR 98

## 2017-08-05 DIAGNOSIS — M6281 Muscle weakness (generalized): Secondary | ICD-10-CM

## 2017-08-05 DIAGNOSIS — R262 Difficulty in walking, not elsewhere classified: Secondary | ICD-10-CM

## 2017-08-05 DIAGNOSIS — R42 Dizziness and giddiness: Secondary | ICD-10-CM

## 2017-08-05 NOTE — Therapy (Signed)
[place] MAIN [facility] SERVICES [address] [place], [place], [address] Phone: [phone]   Fax:  [phone]  Physical Therapy Treatment  Patient Details  Name: Gerald [name] MRN: [id number] Date of Birth: [birth year] Referring Provider: Dr. Kary Kos   Encounter Date: 08/05/2017  PT End of Session - 08/05/17 0901    Visit Number  4    Number of Visits  17    Date for PT Re-Evaluation  09/12/17    PT Start Time  0901    PT Stop Time  0945    PT Time Calculation (min)  44 min    Equipment Utilized During Treatment  Gait belt    Activity Tolerance  Patient tolerated treatment well;Patient limited by fatigue    Behavior During Therapy  [facility] for tasks assessed/performed       Past Medical History:  Diagnosis Date  . Anemia   . Atrial fibrillation ([place])   . Cancer [facility])    Testicular  . Hemochromatosis 10/25/2014  . Hypertension   . Prostate cancer [facility])     Past Surgical History:  Procedure Laterality Date  . APPENDECTOMY    . CHOLECYSTECTOMY    . HAND SURGERY Bilateral   . INNER EAR SURGERY    . KYPHOPLASTY N/A 12/14/2015   Procedure: KYPHOPLASTY  L2;  Surgeon: Hessie Knows, MD;  Location: [facility] ORS;  Service: Orthopedics;  Laterality: N/A;  . PROSTATE SURGERY    . SURGERY SCROTAL / TESTICULAR Right     Vitals:   08/05/17 0901  BP: (!) 155/70  Pulse: 98  SpO2: 100%    Subjective Assessment - 08/05/17 0901    Subjective  Patient states he has been doing his exercises and daily walking and states it is wearing him out. Disucssed trying to break up the exercises instead of doing all at one time. Patient reports the standing on one leg is the most challenging.     Pertinent History  Patient reports his dizziness first started about 3 or 4 years ago after he had pneumonia. Patient reports the dizziness has been fairly constant since then, but states sometimes it is worse than others. Patient reports problems with balance when  he walks and feels pressure in his head. Patient states he can concentrate on something and do what he needs to do, but it takes effort. Patient reports that several years ago he saw an ENT physician and describes having had a VNG test which patient reports was normal. Denies having had a brain MRI or CT scan in last few years. Patient has a history of A-fib and is not on blood thinners per daughter's report because they were concerned about complications of blood thinners and fall concerns. Patient reports his dizziness as vertigo "a little bit", unsteadiness, lightheadedness, falling, whoozy, and swimmy-headed sensation. Patient reports symptoms are constant, motion provoked, positional and constant. Patient reports that head turning, sitting up in bed and sudden moves increase his dizziness and nothing eases his dizziness.     Diagnostic tests  reports he had possible VNG test several years ago which was normal per patient's report    Patient Stated Goals  patient would like to be able to improve his walking, be able to mow the lawn and to do "any kind of work"    Currently in Pain?  Yes    Pain Score  4     Pain Location  -- stomach pains    Pain Descriptors / Indicators  Cramping    Pain Type  Acute pain      Patient reports 3/10 dizziness upon arrival.   Therapeutic Exercise: Nustep: Patient performed Nu-step machine on level 4 for 6 minutes with good pace. Patient's vital signs monitored as follows: oxygen saturation level 100%, heart rate 98 bpm.  Standing hip exercises:  Patient performed 2 sets of 15 reps standing hip flexion, extension and ABD with #2 ankle weight bilaterally with UEs support with CGA.  Patient required short, seated rest break midway during exercise secondary to fatigue.   Neuromuscular Re-education:   Airex balance beam: On Airex balance beam, patient performed sidestepping left/right  multiple reps times 5' each with CGA. Patient initially bracing back of one  hand against // bar for support and requiring verbal cuing for posture. Patient able to do one rep without UEs support.  Patient required one seated rest break during this activity secondary to fatigue.   Diona Foley toss to self:  Patient performed static sitting while tossing ball to self horizontally and then ball circles while tracking ball with head and eyes. Patient reports no increase in dizziness symptoms while performing activity but reports increase in dizziness upon initial stopping of the activity that eased quickly.     PT Education - 08/05/17 0901    Education provided  Yes    Education Details  discussed plan of care and HEP    Person(s) Educated  Patient;Other (comment) grandson    Methods  Explanation;Demonstration;Verbal cues    Comprehension  Verbalized understanding;Returned demonstration       PT Short Term Goals - 08/05/17 1001      PT SHORT TERM GOAL #1   Title  Patient will be independent with home exercise program for self management.     Time  4    Period  Weeks    Status  Achieved        PT Long Term Goals - 07/23/17 2025      PT LONG TERM GOAL #1   Title  Patient will reduce falls risk as indicated by Activities Specific Balance Confidence Scale (ABC) >67%.    Baseline  Scored 51.25% on 07/18/17.    Time  8    Period  Weeks    Status  New    Target Date  09/12/17      PT LONG TERM GOAL #2   Title  Patient will reduce perceived disability to low levels as indicated by <40 on Dizziness Handicap Inventory.    Baseline  scored 66/100 on 07/18/17.    Time  8    Period  Weeks    Status  New    Target Date  09/12/17      PT LONG TERM GOAL #3   Title  Patient will report 50% or greater improvement in his symptoms of dizziness and imbalance with provoking motions or positions by    Time  8    Period  Weeks    Status  New    Target Date  09/12/17      PT LONG TERM GOAL #4   Title  Patient will demonstrate reduced falls risk as evidenced by Dynamic Gait  Index (DGI) >20/24.    Baseline  scored 18/24 on 07/18/17    Time  8    Period  Weeks    Status  New    Target Date  09/12/17            Plan - 08/05/17 0901  Clinical Impression Statement  Patient able to work on progressions of strengthening exercises this date but required several sitting rest breaks secondary to fatigue. Patient requiring cuing for technique with standing hip exercises. Patient continues to be challenged by uneven surfaces but improved with practice. Patient is at risk for falls and would benefit from continued PT services to further work towards goals as set on plan of care.     Rehab Potential  Fair    Clinical Impairments Affecting Rehab Potential  positive indicators: family support, motivated   negative indicators: chronicity, age, co-morbidities    PT Frequency  2x / week    PT Duration  8 weeks    PT Treatment/Interventions  Canalith Repostioning;Gait training;Stair training;Functional mobility training;Neuromuscular re-education;Balance training;Therapeutic exercise;Therapeutic activities;Patient/family education;Vestibular    PT Next Visit Plan  NuStep try level 4, 4 way hip with 2 # ankle weights and progress reps as able, Airex balance pad and sitting ball follows, amb with head turns    PT Home Exercise Plan  VOR X 1 1 minute reps in sitting with conflicting background, feet together and semi-tandem stance progressions with horizontal and vertical head turns and body turns and slow marching with one UE support and sit to stand with one UE support    Consulted and Agree with Plan of Care  Patient;Family member/caregiver    Family Member Consulted  daughter       Patient will benefit from skilled therapeutic intervention in order to improve the following deficits and impairments:  Decreased balance, Decreased endurance, Decreased mobility, Difficulty walking, Dizziness, Decreased activity tolerance, Decreased strength, Postural dysfunction, Pain  Visit  Diagnosis: Dizziness and giddiness  Muscle weakness (generalized)  Difficulty in walking, not elsewhere classified     Problem List Patient Active Problem List   Diagnosis Date Noted  . MDS (myelodysplastic syndrome), low grade ([place]) 10/18/2016  . Anemia of chronic disease 10/21/2015  . Hemochromatosis 10/25/2014  . BP (high blood pressure) 10/21/2014  . CA of prostate ([place]) 10/21/2014  . Cancer of testicle ([place]) 10/21/2014  . Anemia 08/24/2014  . Dizziness 09/14/2013  . Difficulty in walking 09/14/2013  . 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 A-fib ([place]) 06/24/2013  . MI (mitral incompetence) 06/24/2013  . Absolute anemia 06/23/2013  . Arthritis, degenerative 06/23/2013  . Essential (primary) hypertension 06/23/2013   Lady Deutscher PT, DPT [phone] Lady Deutscher 08/05/2017, 11:56 AM  [place] MAIN [facility] SERVICES [address] [place], [place], [address] Phone: [phone]   Fax:  [phone]  Name: Gerald [name] MRN: [id number] Date of Birth: 06-12-1920

## 2017-08-08 ENCOUNTER — Encounter: Payer: Medicare Other | Admitting: Physical Therapy

## 2017-08-12 ENCOUNTER — Ambulatory Visit: Payer: Medicare Other | Admitting: Physical Therapy

## 2017-08-15 ENCOUNTER — Ambulatory Visit: Payer: Medicare Other | Admitting: Physical Therapy

## 2017-08-19 ENCOUNTER — Ambulatory Visit: Payer: Medicare Other | Admitting: Physical Therapy

## 2017-08-22 ENCOUNTER — Ambulatory Visit: Payer: Medicare Other | Admitting: Physical Therapy

## 2017-08-26 ENCOUNTER — Ambulatory Visit: Payer: Medicare Other | Attending: Family Medicine | Admitting: Physical Therapy

## 2017-08-26 ENCOUNTER — Encounter: Payer: Self-pay | Admitting: Physical Therapy

## 2017-08-26 VITALS — BP 133/72

## 2017-08-26 DIAGNOSIS — R262 Difficulty in walking, not elsewhere classified: Secondary | ICD-10-CM

## 2017-08-26 DIAGNOSIS — R42 Dizziness and giddiness: Secondary | ICD-10-CM

## 2017-08-26 DIAGNOSIS — M6281 Muscle weakness (generalized): Secondary | ICD-10-CM

## 2017-08-26 NOTE — Therapy (Signed)
[place] MAIN [facility] SERVICES [address] [place], [place], [address] Phone: [phone]   Fax:  [phone]  Physical Therapy Treatment  Patient Details  Name: Gerald [name] MRN: [id number] Date of Birth: 06/27/1920 Referring Provider: Dr. Kary Kos   Encounter Date: 08/26/2017  PT End of Session - 08/26/17 1019    Visit Number  5    Number of Visits  17    Date for PT Re-Evaluation  09/12/17    PT Start Time  1019    PT Stop Time  1103    PT Time Calculation (min)  44 min    Equipment Utilized During Treatment  Gait belt    Activity Tolerance  Patient tolerated treatment well;Patient limited by fatigue    Behavior During Therapy  [facility] for tasks assessed/performed       Past Medical History:  Diagnosis Date  . Anemia   . Atrial fibrillation ([place])   . Cancer [facility])    Testicular  . Hemochromatosis 10/25/2014  . Hypertension   . Prostate cancer [facility])     Past Surgical History:  Procedure Laterality Date  . APPENDECTOMY    . CHOLECYSTECTOMY    . HAND SURGERY Bilateral   . INNER EAR SURGERY    . KYPHOPLASTY N/A 12/14/2015   Procedure: KYPHOPLASTY  L2;  Surgeon: Hessie Knows, MD;  Location: [facility] ORS;  Service: Orthopedics;  Laterality: N/A;  . PROSTATE SURGERY    . SURGERY SCROTAL / TESTICULAR Right     Vitals:   08/26/17 1108  BP: 133/72    Subjective Assessment - 08/26/17 1018    Subjective  Patient states he has not been doing the home exercise program in the past few weeks because he was away at his granddaughters wedding and then he got an upper respiratory infection for over a week. Patient states he has not noticed any change in his dizziness symptoms. Patient states he feels weak today, but states he will try. Patient states that his dizziness level is 3-4/10 upon arrival.     Pertinent History  Patient reports his dizziness first started about 3 or 4 years ago after he had pneumonia. Patient reports the dizziness  has been fairly constant since then, but states sometimes it is worse than others. Patient reports problems with balance when he walks and feels pressure in his head. Patient states he can concentrate on something and do what he needs to do, but it takes effort. Patient reports that several years ago he saw an ENT physician and describes having had a VNG test which patient reports was normal. Denies having had a brain MRI or CT scan in last few years. Patient has a history of A-fib and is not on blood thinners per daughter's report because they were concerned about complications of blood thinners and fall concerns. Patient reports his dizziness as vertigo "a little bit", unsteadiness, lightheadedness, falling, whoozy, and swimmy-headed sensation. Patient reports symptoms are constant, motion provoked, positional and constant. Patient reports that head turning, sitting up in bed and sudden moves increase his dizziness and nothing eases his dizziness.     Diagnostic tests  reports he had possible VNG test several years ago which was normal per patient's report    Patient Stated Goals  patient would like to be able to improve his walking, be able to mow the lawn and to do "any kind of work"    Currently in Pain?  -- none stated; reports low back  discomfort with ambulation trials       Orthostatic Strategies: Patient reports episodes of dizziness when moving quickly during transitional movements such as sit to stand. Discussed and demonstrated strategies to try to help minimize dizziness from position changes including  moving more slowly when transitioning and before standing performing sitting marches or LAQ and upon standing performing side to side sways or marching in place.   VOR X 1 exercise:  Patient performed VOR X 1 horizontal in standing with conflicting background 3 reps of 1 minute each with verbal cues for speed. Patient demonstrating good technique but need cue for speed.  Patient denies  increase in his dizziness level with this activity. Patient required sitting rest break after this activity secondary to fatigue.   Ambulation with head turns:  Patient performed 69' trials times 2 each of forwards ambulation with horizontal and vertical head turns with CGA.  Patient performed 62' ambulation with self-selected, random vertical and horizontal head turns with CGA.   Patient demonstrates mild change in gait speed and uneven steppage with ambulation with head turns and had one small loss of balance with vertical head turns that he was able to self-correct. Patient using [facility] for ambulation during all of the above trials. Patient reports vertical head turns is more difficult and states looking up usually brings on dizziness. Patient reports 5-6/10 dizziness. Patient required 2 sitting rest breaks between trials during this activity secondary to fatigue and complaints of low back discomfort.   Body Wall Rolls:  Patient performed 4 reps of supported, body wall rolls with eyes open with CGA.  Patient reports 5-6/10 dizziness with this activity.  Issued for HEP.   Ball toss over shoulder: Patient performed static standing in // bars while tossing ball over one shoulder with return catch over opposite shoulder with SBA.  Patient performed static standing in // bars while tossing ball over one shoulder with return catch over opposite shoulder varying the ball position to head, shoulder and waist level to promote head turning and tilting. Patient reports that when he reaches and looks up to retrieve the ball this increases his dizziness.    PT Education - 08/26/17 1018    Education provided  Yes    Person(s) Educated  Patient    Methods  Explanation;Demonstration;Verbal cues    Comprehension  Verbalized understanding;Returned demonstration       PT Short Term Goals - 08/05/17 1001      PT SHORT TERM GOAL #1   Title  Patient will be independent with home exercise program for self  management.     Time  4    Period  Weeks    Status  Achieved        PT Long Term Goals - 07/23/17 2025      PT LONG TERM GOAL #1   Title  Patient will reduce falls risk as indicated by Activities Specific Balance Confidence Scale (ABC) >67%.    Baseline  Scored 51.25% on 07/18/17.    Time  8    Period  Weeks    Status  New    Target Date  09/12/17      PT LONG TERM GOAL #2   Title  Patient will reduce perceived disability to low levels as indicated by <40 on Dizziness Handicap Inventory.    Baseline  scored 66/100 on 07/18/17.    Time  8    Period  Weeks    Status  New    Target Date  09/12/17      PT LONG TERM GOAL #3   Title  Patient will report 50% or greater improvement in his symptoms of dizziness and imbalance with provoking motions or positions by    Time  8    Period  Weeks    Status  New    Target Date  09/12/17      PT LONG TERM GOAL #4   Title  Patient will demonstrate reduced falls risk as evidenced by Dynamic Gait Index (DGI) >20/24.    Baseline  scored 18/24 on 07/18/17    Time  8    Period  Weeks    Status  New    Target Date  09/12/17            Plan - 08/26/17 1019    Clinical Impression Statement  Patient returns to the clinic after about a 2 week break secondary to patient was out of town and then ill with an upper respiratory infection. Patient reports that he had not been able to do his home exercise program in the past 2 weeks but states he did try to walk a little each day to keep up his strength. Patient required several sitting rest breaks during session secondary to fatigue. Patient had increase in dizziness symptoms with body wall rolls and ambulation with head turns. Patient reports looking up recreates his dizziness and patient did have one small loss of balance while performing ambulation with vertical head turns. Patient would benefit from continued PT services to address functional deficits and goals as set on plan of care.     Rehab  Potential  Fair    Clinical Impairments Affecting Rehab Potential  positive indicators: family support, motivated   negative indicators: chronicity, age, co-morbidities    PT Frequency  2x / week    PT Duration  8 weeks    PT Treatment/Interventions  Canalith Repostioning;Gait training;Stair training;Functional mobility training;Neuromuscular re-education;Balance training;Therapeutic exercise;Therapeutic activities;Patient/family education;Vestibular    PT Next Visit Plan  NuStep try level 4, 4 way hip with 2 # ankle weights and progress reps as able, Airex balance pad, amb with head turns, ball sorting bins next session     PT Home Exercise Plan  VOR X 1 1 minute reps in sitting with conflicting background, feet together and semi-tandem stance progressions with horizontal and vertical head turns and body turns and slow marching with one UE support and sit to stand with one UE support    Consulted and Agree with Plan of Care  Patient;Family member/caregiver    Family Member Consulted  daughter       Patient will benefit from skilled therapeutic intervention in order to improve the following deficits and impairments:  Decreased balance, Decreased endurance, Decreased mobility, Difficulty walking, Dizziness, Decreased activity tolerance, Decreased strength, Postural dysfunction, Pain  Visit Diagnosis: Dizziness and giddiness  Muscle weakness (generalized)  Difficulty in walking, not elsewhere classified     Problem List Patient Active Problem List   Diagnosis Date Noted  . MDS (myelodysplastic syndrome), low grade ([place]) 10/18/2016  . Anemia of chronic disease 10/21/2015  . Hemochromatosis 10/25/2014  . BP (high blood pressure) 10/21/2014  . CA of prostate ([place]) 10/21/2014  . Cancer of testicle ([place]) 10/21/2014  . Anemia 08/24/2014  . Dizziness 09/14/2013  . Difficulty in walking 09/14/2013  . 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 A-fib ([place]) 06/24/2013  . MI (mitral incompetence)  06/24/2013  . Absolute anemia 06/23/2013  . Arthritis, degenerative 06/23/2013  .  Essential (primary) hypertension 06/23/2013   Lady Deutscher PT, DPT [phone] Lady Deutscher 08/26/2017, 11:27 AM  [place] MAIN [facility] SERVICES [address] [place], [place], [address] Phone: [phone]   Fax:  [phone]  Name: LENIX KIDD MRN: [id number] Date of Birth: 1920-04-26

## 2017-08-28 ENCOUNTER — Encounter: Payer: Self-pay | Admitting: Physical Therapy

## 2017-08-28 ENCOUNTER — Ambulatory Visit: Payer: Medicare Other | Admitting: Physical Therapy

## 2017-08-28 VITALS — BP 128/62 | HR 99

## 2017-08-28 DIAGNOSIS — M6281 Muscle weakness (generalized): Secondary | ICD-10-CM

## 2017-08-28 DIAGNOSIS — R262 Difficulty in walking, not elsewhere classified: Secondary | ICD-10-CM

## 2017-08-28 DIAGNOSIS — R42 Dizziness and giddiness: Secondary | ICD-10-CM

## 2017-08-28 NOTE — Therapy (Signed)
[place] MAIN [facility] SERVICES [address] [place], [place], [address] Phone: [phone]   Fax:  [phone]  Physical Therapy Treatment  Patient Details  Name: Gerald [name] MRN: [id number] Date of Birth: [birth year] Referring Provider: Dr. Kary Kos   Encounter Date: 08/28/2017  PT End of Session - 08/28/17 1307    Visit Number  6    Number of Visits  17    Date for PT Re-Evaluation  09/12/17    PT Start Time  6010    PT Stop Time  1351    PT Time Calculation (min)  46 min    Equipment Utilized During Treatment  Gait belt    Activity Tolerance  Patient tolerated treatment well    Behavior During Therapy  [facility] for tasks assessed/performed       Past Medical History:  Diagnosis Date  . Anemia   . Atrial fibrillation ([place])   . Cancer [facility])    Testicular  . Hemochromatosis 10/25/2014  . Hypertension   . Prostate cancer [place])     Past Surgical History:  Procedure Laterality Date  . APPENDECTOMY    . CHOLECYSTECTOMY    . HAND SURGERY Bilateral   . INNER EAR SURGERY    . KYPHOPLASTY N/A 12/14/2015   Procedure: KYPHOPLASTY  L2;  Surgeon: Hessie Knows, MD;  Location: [facility] ORS;  Service: Orthopedics;  Laterality: N/A;  . PROSTATE SURGERY    . SURGERY SCROTAL / TESTICULAR Right     Vitals:   08/28/17 1439  BP: 128/62  Pulse: 99  SpO2: 100%    Subjective Assessment - 08/28/17 1305    Subjective  Patient states he did the body wall roll exercise and patient states he "likes that one it seems to help me". Patient states he did walking with head turns at home as well. Patient reports that he has been more fatigued since getting sick the last few weeks. Patient states his dizziness was worse when he was sick with the URI, but is a little better now.     Pertinent History  Patient reports his dizziness first started about 3 or 4 years ago after he had pneumonia. Patient reports the dizziness has been fairly constant since then, but  states sometimes it is worse than others. Patient reports problems with balance when he walks and feels pressure in his head. Patient states he can concentrate on something and do what he needs to do, but it takes effort. Patient reports that several years ago he saw an ENT physician and describes having had a VNG test which patient reports was normal. Denies having had a brain MRI or CT scan in last few years. Patient has a history of A-fib and is not on blood thinners per daughter's report because they were concerned about complications of blood thinners and fall concerns. Patient reports his dizziness as vertigo "a little bit", unsteadiness, lightheadedness, falling, whoozy, and swimmy-headed sensation. Patient reports symptoms are constant, motion provoked, positional and constant. Patient reports that head turning, sitting up in bed and sudden moves increase his dizziness and nothing eases his dizziness.     Diagnostic tests  reports he had possible VNG test several years ago which was normal per patient's report    Patient Stated Goals  patient would like to be able to improve his walking, be able to mow the lawn and to do "any kind of work"    Currently in Pain?  Yes with walking  Pain Location  Back    Pain Orientation  Lower    Pain Type  Chronic pain       Neuromuscular Re-education:  VOR X 1 exercise:  Patient performed walking VOR X 1 horizontal with conflicting background 3 reps with use of SPC with CGA. Patient demonstrates good technique.  Patient denies increase in dizziness with this activity.   Body Wall Rolls:  Patient performed 4 reps of supported, body wall rolls with eyes closed with CGA.  Patient reaches to touch wall for support at the end of each turn but largely did not need wall for support this date. Patient reports 5-6/10 dizziness with this activity.   Ambulation with head turns:  Patient performed 150' forwards and retro ambulation with SPC while scanning for  targets in hallway.  Patient reports increased dizziness and imbalance with retro ambulation.     Ball toss over shoulder: Patient performed static standing while tossing ball over one shoulder with return catch over opposite shoulder varying the ball position to head, shoulder and waist level to promote head turning and tilting.   Eyes Closed Activities: Patient performed static standing with normal base of support and then repeated with narrow BOS with eyes closed body turns, horizontal and then vertical head turns multiple 30 second trials with CGA. Patient demonstrates good ankle and hip strategies to regain losses of balance. Patient with increased sway and a few small losses of balance that he was largely able to correct using hip and ankle strategies but did reach to touch // bars with one hand several times for a few seconds until he was able to regain his balance.  Patient stood on Airex pad with normal BOS with EC with horizontal head turns with CGA/MinA.   Note: patient required several short, sitting rest breaks during session and vital signs were obtained after activity during a rest break.   Patient did well this date and was able to try eyes closed activities, retro ambulation with head turns and walking VOR with conflicting background with CGA. Patient reports the eyes closed body wall rolls and the retro ambulation while scanning for playing cards in hallway increased his dizziness and were challenging to his balance. Patient encouraged to follow-up as indicated and to continue to do his HEP.   PT Education - 08/28/17 1305    Education provided  Yes    Education Details  discussed continuing to perform HEP    Person(s) Educated  Patient grandson    Methods  Explanation;Verbal cues    Comprehension  Verbalized understanding       PT Short Term Goals - 08/28/17 1415      PT SHORT TERM GOAL #1   Title  Patient will be independent with home exercise program for self  management.     Time  4    Period  Weeks    Status  Achieved        PT Long Term Goals - 07/23/17 2025      PT LONG TERM GOAL #1   Title  Patient will reduce falls risk as indicated by Activities Specific Balance Confidence Scale (ABC) >67%.    Baseline  Scored 51.25% on 07/18/17.    Time  8    Period  Weeks    Status  New    Target Date  09/12/17      PT LONG TERM GOAL #2   Title  Patient will reduce perceived disability to low levels as indicated by <  40 on Dizziness Handicap Inventory.    Baseline  scored 66/100 on 07/18/17.    Time  8    Period  Weeks    Status  New    Target Date  09/12/17      PT LONG TERM GOAL #3   Title  Patient will report 50% or greater improvement in his symptoms of dizziness and imbalance with provoking motions or positions by    Time  8    Period  Weeks    Status  New    Target Date  09/12/17      PT LONG TERM GOAL #4   Title  Patient will demonstrate reduced falls risk as evidenced by Dynamic Gait Index (DGI) >20/24.    Baseline  scored 18/24 on 07/18/17    Time  8    Period  Weeks    Status  New    Target Date  09/12/17            Plan - 08/28/17 1308    Clinical Impression Statement  Patient did well this date and was able to try eyes closed activities, retro ambulation with head turns and walking VOR with conflicting background with CGA. Patient reports the eyes closed body wall rolls and the retro ambulation while scanning for playing cards in hallway increased his dizziness and were challenging to his balance. Patient encouraged to follow-up as indicated and to continue to do his HEP.     Rehab Potential  Fair    Clinical Impairments Affecting Rehab Potential  positive indicators: family support, motivated   negative indicators: chronicity, age, co-morbidities    PT Frequency  2x / week    PT Duration  8 weeks    PT Treatment/Interventions  Canalith Repostioning;Gait training;Stair training;Functional mobility training;Neuromuscular  re-education;Balance training;Therapeutic exercise;Therapeutic activities;Patient/family education;Vestibular    PT Next Visit Plan  4 way hip with 2 # ankle weights and progress reps as able, ball sorting bins next session, EC body wall rolls, EC on Airex pad    PT Home Exercise Plan  VOR X 1 1 minute reps in sitting with conflicting background, feet together and semi-tandem stance progressions with horizontal and vertical head turns and body turns and slow marching with one UE support and sit to stand with one UE support    Consulted and Agree with Plan of Care  Patient;Family member/caregiver    Family Member Consulted  daughter       Patient will benefit from skilled therapeutic intervention in order to improve the following deficits and impairments:  Decreased balance, Decreased endurance, Decreased mobility, Difficulty walking, Dizziness, Decreased activity tolerance, Decreased strength, Postural dysfunction, Pain  Visit Diagnosis: Dizziness and giddiness  Muscle weakness (generalized)  Difficulty in walking, not elsewhere classified     Problem List Patient Active Problem List   Diagnosis Date Noted  . MDS (myelodysplastic syndrome), low grade ([place]) 10/18/2016  . Anemia of chronic disease 10/21/2015  . Hemochromatosis 10/25/2014  . BP (high blood pressure) 10/21/2014  . CA of prostate ([place]) 10/21/2014  . Cancer of testicle ([place]) 10/21/2014  . Anemia 08/24/2014  . Dizziness 09/14/2013  . Difficulty in walking 09/14/2013  . 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 A-fib ([place]) 06/24/2013  . MI (mitral incompetence) 06/24/2013  . Absolute anemia 06/23/2013  . Arthritis, degenerative 06/23/2013  . Essential (primary) hypertension 06/23/2013   Lady Deutscher PT, DPT [phone] Lady Deutscher 08/28/2017, 2:47 PM  [place] MAIN [facility] SERVICES [address]  [place], [place], [address] Phone: [phone]   Fax:  [phone]  Name: JADIEL SCHMIEDER MRN: [id number] Date of Birth: Feb 14, 1921

## 2017-09-02 ENCOUNTER — Encounter: Payer: Self-pay | Admitting: Physical Therapy

## 2017-09-02 ENCOUNTER — Ambulatory Visit: Payer: Medicare Other | Admitting: Physical Therapy

## 2017-09-02 VITALS — BP 150/71 | HR 95

## 2017-09-02 DIAGNOSIS — R262 Difficulty in walking, not elsewhere classified: Secondary | ICD-10-CM

## 2017-09-02 DIAGNOSIS — R42 Dizziness and giddiness: Secondary | ICD-10-CM | POA: Diagnosis not present

## 2017-09-02 DIAGNOSIS — M6281 Muscle weakness (generalized): Secondary | ICD-10-CM

## 2017-09-02 NOTE — Therapy (Addendum)
[place] MAIN [facility] SERVICES [address] [place], [place], [address] Phone: [phone]   Fax:  [phone]  Physical Therapy Treatment  Patient Details  Name: Gerald [name] MRN: [id number] Date of Birth: [birth year] Referring Provider: Dr. Kary Kos   Encounter Date: 09/02/2017  PT End of Session - 09/02/17 1017    Visit Number  6    Number of Visits  17    Date for PT Re-Evaluation  09/12/17    PT Start Time  1017    PT Stop Time  1105    PT Time Calculation (min)  48 min    Equipment Utilized During Treatment  Gait belt    Activity Tolerance  Patient tolerated treatment well    Behavior During Therapy  [facility] for tasks assessed/performed       Past Medical History:  Diagnosis Date  . Anemia   . Atrial fibrillation ([place])   . Cancer [facility])    Testicular  . Hemochromatosis 10/25/2014  . Hypertension   . Prostate cancer [facility])     Past Surgical History:  Procedure Laterality Date  . APPENDECTOMY    . CHOLECYSTECTOMY    . HAND SURGERY Bilateral   . INNER EAR SURGERY    . KYPHOPLASTY N/A 12/14/2015   Procedure: KYPHOPLASTY  L2;  Surgeon: Hessie Knows, MD;  Location: [facility] ORS;  Service: Orthopedics;  Laterality: N/A;  . PROSTATE SURGERY    . SURGERY SCROTAL / TESTICULAR Right     Vitals:   09/02/17 1559 09/02/17 1600  BP: (!) 156/78 (!) 150/71  Pulse: 95   SpO2: 100%     Subjective Assessment - 09/02/17 1016    Subjective  Patient reports that he is feeling much better this week. Patient reports that he has noticed that he has been less dizzy and that his balance is better in the morning than it was before starting therapy. Patient states that he has continued to get dizzy in the afternoons.     Pertinent History  Patient reports his dizziness first started about 3 or 4 years ago after he had pneumonia. Patient reports the dizziness has been fairly constant since then, but states sometimes it is worse than others. Patient  reports problems with balance when he walks and feels pressure in his head. Patient states he can concentrate on something and do what he needs to do, but it takes effort. Patient reports that several years ago he saw an ENT physician and describes having had a VNG test which patient reports was normal. Denies having had a brain MRI or CT scan in last few years. Patient has a history of A-fib and is not on blood thinners per daughter's report because they were concerned about complications of blood thinners and fall concerns. Patient reports his dizziness as vertigo "a little bit", unsteadiness, lightheadedness, falling, whoozy, and swimmy-headed sensation. Patient reports symptoms are constant, motion provoked, positional and constant. Patient reports that head turning, sitting up in bed and sudden moves increase his dizziness and nothing eases his dizziness.     Diagnostic tests  reports he had possible VNG test several years ago which was normal per patient's report    Patient Stated Goals  patient would like to be able to improve his walking, be able to mow the lawn and to do "any kind of work"    Currently in Pain?  No/denies none stated        Patient arrives to the clinic reporting  5/10 dizziness at rest.  Body Wall Rolls:  Patient performed 4 reps of supported, body wall rolls with eyes open with CGA.  Patient reports increased dizziness with this activity.    Hallway ball toss:  In hallway, worked on ball toss against one wall with alternating quick turns to toss ball against opposite wall while tracking with eyes and head. Patient required verbal cues for eye tracking. Patient reports mild increase in dizziness rating it has 3-4/10and required sitting rest break after activity.    VOR x 2 exercise:  Demonstrated and educated as to VOR X 2. Patient performed VOR X 2 horiz in sitting 2 reps of 1 minute each with verbal cues for technique.  Patient reports no dizziness with this activity.  Did not add to HEP.  Quick Turns:  Patient performed 6 reps of walking 10' with alternating quick turns left and right with use of cane. Patient reports mild increase in dizziness, rating as 3-4/10.  Ball Sorting Activity: On firm surface, performed transferring multicolored balls from bin placed on 6" wooden step placed on the floor to another bin placed 180 degrees on the other side of patient while patient visually tracks the ball so that patient is required to turn 180 degrees Left and Right to turn to bend over and pick and place balls in the bins with CGA. Pt with no loss of balance and patient reports this was difficult because he had to track the ball as he turned and this brought on some dizziness. Patient required sitting rest break after this activity.   Patient stated the body wall rolls and the ball sorting activities were the most challenging and reproduced his dizziness the most this date.       PT Education - 09/02/17 1016    Education provided  Yes    Education Details  discussed safety with HEP; Patient's daughter states that patient has been trying body wall rolls outside on a concrete pad with use of walker as needed and some backwards walking. Reeducated as to safety with home exercise program. Patient and his daughter in agreement.     Person(s) Educated  Patient;Child(ren) daughter    Methods  Explanation    Comprehension  Verbalized understanding       PT Short Term Goals - 08/28/17 1415      PT SHORT TERM GOAL #1   Title  Patient will be independent with home exercise program for self management.     Time  4    Period  Weeks    Status  Achieved        PT Long Term Goals - 09/02/17 1544      PT LONG TERM GOAL #1   Title  Patient will reduce falls risk as indicated by Activities Specific Balance Confidence Scale (ABC) >67%.    Baseline  Scored 51.25% on 07/18/17.    Time  8    Period  Weeks    Status  On-going      PT LONG TERM GOAL #2   Title  Patient  will reduce perceived disability to low levels as indicated by <40 on Dizziness Handicap Inventory.    Baseline  scored 66/100 on 07/18/17.    Time  8    Period  Weeks    Status  On-going      PT LONG TERM GOAL #3   Title  Patient will report 50% or greater improvement in his symptoms of dizziness and imbalance with provoking motions  or positions by    Time  8    Period  Weeks    Status  Partially Met      PT LONG TERM GOAL #4   Title  Patient will demonstrate reduced falls risk as evidenced by Dynamic Gait Index (DGI) >20/24.    Baseline  scored 18/24 on 07/18/17    Time  8    Period  Weeks    Status  On-going            Plan - 09/02/17 1017    Clinical Impression Statement  Patient stated the body wall rolls and the ball sorting activities were the most challenging and reproduced his dizziness the most this date. Patient states that he notices quick turns are the most challenging at home. Patient states that he has noticed a decrease in his dizziness in the mornings now since starting therapy. Patient performing his home exercise program, but did review safety precautions with patient and his daughter. Will plan on repeating functional outcome measures in the next 1-2 sessions and assessing progress towards goals.     Rehab Potential  Fair    Clinical Impairments Affecting Rehab Potential  positive indicators: family support, motivated   negative indicators: chronicity, age, co-morbidities    PT Frequency  2x / week    PT Duration  8 weeks    PT Treatment/Interventions  Canalith Repostioning;Gait training;Stair training;Functional mobility training;Neuromuscular re-education;Balance training;Therapeutic exercise;Therapeutic activities;Patient/family education;Vestibular    PT Next Visit Plan  repeat functional outcome testing in 1-2 visits, ball sorting bins, activities with quick turns and body turns, EC on Airex pad, 4 way hip with 2 # ankle weights and progress reps as able    PT  Home Exercise Plan  VOR X 1 1 minute reps in sitting with conflicting background, feet together and semi-tandem stance progressions with horizontal and vertical head turns and body turns and slow marching with one UE support and sit to stand with one UE support    Consulted and Agree with Plan of Care  Patient;Family member/caregiver    Family Member Consulted  daughter       Patient will benefit from skilled therapeutic intervention in order to improve the following deficits and impairments:  Decreased balance, Decreased endurance, Decreased mobility, Difficulty walking, Dizziness, Decreased activity tolerance, Decreased strength, Postural dysfunction, Pain  Visit Diagnosis: Dizziness and giddiness  Muscle weakness (generalized)  Difficulty in walking, not elsewhere classified     Problem List Patient Active Problem List   Diagnosis Date Noted  . MDS (myelodysplastic syndrome), low grade ([place]) 10/18/2016  . Anemia of chronic disease 10/21/2015  . Hemochromatosis 10/25/2014  . BP (high blood pressure) 10/21/2014  . CA of prostate ([place]) 10/21/2014  . Cancer of testicle ([place]) 10/21/2014  . Anemia 08/24/2014  . Dizziness 09/14/2013  . Difficulty in walking 09/14/2013  . 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 A-fib ([place]) 06/24/2013  . MI (mitral incompetence) 06/24/2013  . Absolute anemia 06/23/2013  . Arthritis, degenerative 06/23/2013  . Essential (primary) hypertension 06/23/2013   Lady Deutscher PT, DPT [phone] Lady Deutscher 09/02/2017, 4:04 PM  [place] MAIN [facility] SERVICES [address] [place], [place], [address] Phone: [phone]   Fax:  [phone]  Name: Gerald [name] MRN: [id number] Date of Birth: 10-10-1920

## 2017-09-05 ENCOUNTER — Ambulatory Visit: Payer: Medicare Other

## 2017-09-05 VITALS — BP 125/60 | HR 92

## 2017-09-05 DIAGNOSIS — R262 Difficulty in walking, not elsewhere classified: Secondary | ICD-10-CM

## 2017-09-05 DIAGNOSIS — R42 Dizziness and giddiness: Secondary | ICD-10-CM | POA: Diagnosis not present

## 2017-09-05 DIAGNOSIS — M6281 Muscle weakness (generalized): Secondary | ICD-10-CM

## 2017-09-05 NOTE — Therapy (Signed)
[place] MAIN [facility] SERVICES [address] [place], [place], [address] Phone: [phone]   Fax:  [phone]  Physical Therapy Treatment  Patient Details  Name: Gerald [name] MRN: [id number] Date of Birth: 1920/06/13 Referring Provider: Dr. Kary Kos   Encounter Date: 09/05/2017  PT End of Session - 09/05/17 1033    Visit Number  8    Number of Visits  17    Date for PT Re-Evaluation  09/12/17    PT Start Time  1025    PT Stop Time  1110    PT Time Calculation (min)  45 min    Equipment Utilized During Treatment  Gait belt    Activity Tolerance  Patient tolerated treatment well    Behavior During Therapy  [facility] for tasks assessed/performed       Past Medical History:  Diagnosis Date  . Anemia   . Atrial fibrillation ([place])   . Cancer [facility])    Testicular  . Hemochromatosis 10/25/2014  . Hypertension   . Prostate cancer [facility])     Past Surgical History:  Procedure Laterality Date  . APPENDECTOMY    . CHOLECYSTECTOMY    . HAND SURGERY Bilateral   . INNER EAR SURGERY    . KYPHOPLASTY N/A 12/14/2015   Procedure: KYPHOPLASTY  L2;  Surgeon: Hessie Knows, MD;  Location: [facility] ORS;  Service: Orthopedics;  Laterality: N/A;  . PROSTATE SURGERY    . SURGERY SCROTAL / TESTICULAR Right     Vitals:   09/05/17 1025  BP: 125/60  Pulse: 92  SpO2: 100%    Subjective Assessment - 09/05/17 1025    Subjective  Patient reports that he has continued to feel better as the week has progressed regarding his illness from the prior weeks. No changes in dizziness symptoms since his session earlier this week. No changes in health or medications recently. No specific questions or concerns upon arrival.    Pertinent History  Patient reports his dizziness first started about 3 or 4 years ago after he had pneumonia. Patient reports the dizziness has been fairly constant since then, but states sometimes it is worse than others. Patient reports problems with  balance when he walks and feels pressure in his head. Patient states he can concentrate on something and do what he needs to do, but it takes effort. Patient reports that several years ago he saw an ENT physician and describes having had a VNG test which patient reports was normal. Denies having had a brain MRI or CT scan in last few years. Patient has a history of A-fib and is not on blood thinners per daughter's report because they were concerned about complications of blood thinners and fall concerns. Patient reports his dizziness as vertigo "a little bit", unsteadiness, lightheadedness, falling, whoozy, and swimmy-headed sensation. Patient reports symptoms are constant, motion provoked, positional and constant. Patient reports that head turning, sitting up in bed and sudden moves increase his dizziness and nothing eases his dizziness.     Diagnostic tests  reports he had possible VNG test several years ago which was normal per patient's report    Patient Stated Goals  patient would like to be able to improve his walking, be able to mow the lawn and to do "any kind of work"    Currently in Pain?  No/denies          TREATMENT  Patient arrives to the clinic reporting 3.5/10 dizziness at rest.  Ther-ex   NuStep  NuStep L3 x 5 minutes for warm-up during history (3 minutes unbilled);  Standing Exercises In // bars performed: Standing hip abduction 2# ankle weight 2 x 10; Standing hip flexion (SLR) 2# ankle weight 2 x 10; Standing hip abduction 2# ankle weight 2 x 10 Heel/toe raises bilateral with UE support 2 x 10;  Neuromuscular Re-education  Quick Turns:  Patient walking 10' with alternating quick turns left, then right, then alternating x 8 reps of each;  Body Wall Rolls:  Patient performed 4 reps of supported, body wall rolls with eyes closed with CGA.  Patient reports increased dizziness with this activity but does not rate;  Gym ball pass:  In gym, worked on forward ambulation  with ball pass to therapist over shoulder turning head and following with eyes 35' x 2. Patient required verbal cues for eye tracking. Patient reports mild increase in dizziness;  Ball Sorting Activity: On firm surface, performed transferring multicolored balls from bin placed on 6" wooden step placed on the floor to another bin placed 180 degrees on the other side at waist height of patient while patient visually tracks the ball so that patient is required to turn 180 degrees Left and Right to turn to bend over and pick and place balls in the bins with CGA. Pt with one loss of balance and reports 5/10 dizziness.  Patient stated the body wall rolls and the ball sorting activities were the most challenging and reproduced his dizziness the most this date.                     PT Education - 09/05/17 1032    Education provided  Yes    Education Details  exercise form/technique    Person(s) Educated  Patient    Methods  Explanation    Comprehension  Verbalized understanding       PT Short Term Goals - 08/28/17 1415      PT SHORT TERM GOAL #1   Title  Patient will be independent with home exercise program for self management.     Time  4    Period  Weeks    Status  Achieved        PT Long Term Goals - 09/02/17 1544      PT LONG TERM GOAL #1   Title  Patient will reduce falls risk as indicated by Activities Specific Balance Confidence Scale (ABC) >67%.    Baseline  Scored 51.25% on 07/18/17.    Time  8    Period  Weeks    Status  On-going      PT LONG TERM GOAL #2   Title  Patient will reduce perceived disability to low levels as indicated by <40 on Dizziness Handicap Inventory.    Baseline  scored 66/100 on 07/18/17.    Time  8    Period  Weeks    Status  On-going      PT LONG TERM GOAL #3   Title  Patient will report 50% or greater improvement in his symptoms of dizziness and imbalance with provoking motions or positions by    Time  8    Period  Weeks     Status  Partially Met      PT LONG TERM GOAL #4   Title  Patient will demonstrate reduced falls risk as evidenced by Dynamic Gait Index (DGI) >20/24.    Baseline  scored 18/24 on 07/18/17    Time  8  Period  Weeks    Status  On-going            Plan - 09/05/17 1033    Clinical Impression Statement  Patient reports continued dizziness with body rolls as well as quick turns during ambulation. He also reports dizziness and fatigue with 180 degree ball passes. He is able to complete standing hip strengthening exercises without reported increase in knee pain. Pt encouraged to continue HEP and reinforced safe technique with body rolls encouraging him not to perform on concrete. Pt will benefit from continued skilled PT services to address remaining deficits.    Rehab Potential  Fair    Clinical Impairments Affecting Rehab Potential  positive indicators: family support, motivated   negative indicators: chronicity, age, co-morbidities    PT Frequency  2x / week    PT Duration  8 weeks    PT Treatment/Interventions  Canalith Repostioning;Gait training;Stair training;Functional mobility training;Neuromuscular re-education;Balance training;Therapeutic exercise;Therapeutic activities;Patient/family education;Vestibular    PT Next Visit Plan  repeat functional outcome testing in 1-2 visits, ball sorting bins, activities with quick turns and body turns, EC on Airex pad, 4 way hip with 2 # ankle weights and progress reps as able    PT Home Exercise Plan  VOR X 1 1 minute reps in sitting with conflicting background, feet together and semi-tandem stance progressions with horizontal and vertical head turns and body turns and slow marching with one UE support and sit to stand with one UE support    Consulted and Agree with Plan of Care  Patient;Family member/caregiver    Family Member Consulted  daughter       Patient will benefit from skilled therapeutic intervention in order to improve the following  deficits and impairments:  Decreased balance, Decreased endurance, Decreased mobility, Difficulty walking, Dizziness, Decreased activity tolerance, Decreased strength, Postural dysfunction, Pain  Visit Diagnosis: Dizziness and giddiness  Muscle weakness (generalized)  Difficulty in walking, not elsewhere classified     Problem List Patient Active Problem List   Diagnosis Date Noted  . MDS (myelodysplastic syndrome), low grade ([place]) 10/18/2016  . Anemia of chronic disease 10/21/2015  . Hemochromatosis 10/25/2014  . BP (high blood pressure) 10/21/2014  . CA of prostate ([place]) 10/21/2014  . Cancer of testicle ([place]) 10/21/2014  . Anemia 08/24/2014  . Dizziness 09/14/2013  . Difficulty in walking 09/14/2013  . 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 A-fib ([place]) 06/24/2013  . MI (mitral incompetence) 06/24/2013  . Absolute anemia 06/23/2013  . Arthritis, degenerative 06/23/2013  . Essential (primary) hypertension 06/23/2013   Phillips Grout PT, DPT   Osha Errico 09/05/2017, 12:47 PM  [place] MAIN [facility] SERVICES [address] [place], [place], [address] Phone: [phone]   Fax:  [phone]  Name: BURLEIGH BROCKMANN MRN: [id number] Date of Birth: March 19, 1920

## 2017-09-09 ENCOUNTER — Ambulatory Visit: Payer: Medicare Other | Admitting: Physical Therapy

## 2017-09-12 ENCOUNTER — Ambulatory Visit: Payer: Medicare Other | Admitting: Physical Therapy

## 2017-09-16 ENCOUNTER — Encounter: Payer: Self-pay | Admitting: Physical Therapy

## 2017-09-16 ENCOUNTER — Ambulatory Visit: Payer: Medicare Other | Attending: Family Medicine | Admitting: Physical Therapy

## 2017-09-16 DIAGNOSIS — M6281 Muscle weakness (generalized): Secondary | ICD-10-CM | POA: Diagnosis present

## 2017-09-16 DIAGNOSIS — R42 Dizziness and giddiness: Secondary | ICD-10-CM | POA: Diagnosis present

## 2017-09-16 DIAGNOSIS — R262 Difficulty in walking, not elsewhere classified: Secondary | ICD-10-CM | POA: Insufficient documentation

## 2017-09-16 NOTE — Therapy (Addendum)
[place] MAIN [facility] SERVICES [address] [place], [place], [address] Phone: [phone]   Fax:  [phone]  Physical Therapy Treatment/Discharge Summary  Patient Details  Name: Gerald [name] MRN: [id number] Date of Birth: October 13, 1920 Referring Provider: Dr. Kary Kos   Encounter Date: 09/16/2017  PT End of Session - 09/19/17 1451    Visit Number  9    Number of Visits  17    Date for PT Re-Evaluation  09/12/17    PT Start Time  8786    PT Stop Time  1100    PT Time Calculation (min)  45 min    Equipment Utilized During Treatment  Gait belt    Activity Tolerance  Patient tolerated treatment well    Behavior During Therapy  [facility] for tasks assessed/performed       Past Medical History:  Diagnosis Date  . Anemia   . Atrial fibrillation ([place])   . [facility])    Testicular  . Hemochromatosis 10/25/2014  . Hypertension   . Prostate cancer [facility])     Past Surgical History:  Procedure Laterality Date  . APPENDECTOMY    . CHOLECYSTECTOMY    . HAND SURGERY Bilateral   . INNER EAR SURGERY    . KYPHOPLASTY N/A 12/14/2015   Procedure: KYPHOPLASTY  L2;  Surgeon: Hessie Knows, MD;  Location: [facility] ORS;  Service: Orthopedics;  Laterality: N/A;  . PROSTATE SURGERY    . SURGERY SCROTAL / TESTICULAR Right     There were no vitals filed for this visit.  Subjective Assessment - 09/19/17 1446    Subjective  Patient reports he has noticed that he is able to do things now that he was not able to do for quite a while. Patient states he worked outside doing yard work and shop work for about 4 hours yesterday. Patient state his muscles are sore and he is tired today. Patient reports that he was so glad to be able to do this kind of work again. Patient reports that he does still have very mild dizziness at times. Patient reports that he is happy with his progress.     Pertinent History  Patient reports his dizziness first started about 3 or 4 years ago  after he had pneumonia. Patient reports the dizziness has been fairly constant since then, but states sometimes it is worse than others. Patient reports problems with balance when he walks and feels pressure in his head. Patient states he can concentrate on something and do what he needs to do, but it takes effort. Patient reports that several years ago he saw an ENT physician and describes having had a VNG test which patient reports was normal. Denies having had a brain MRI or CT scan in last few years. Patient has a history of A-fib and is not on blood thinners per daughter's report because they were concerned about complications of blood thinners and fall concerns. Patient reports his dizziness as vertigo "a little bit", unsteadiness, lightheadedness, falling, whoozy, and swimmy-headed sensation. Patient reports symptoms are constant, motion provoked, positional and constant. Patient reports that head turning, sitting up in bed and sudden moves increase his dizziness and nothing eases his dizziness.     Diagnostic tests  reports he had possible VNG test several years ago which was normal per patient's report    Patient Stated Goals  patient would like to be able to improve his walking, be able to mow the lawn and to do "any  kind of work"    Currently in Pain?  No/denies        Physical Performance:   Patient reports that he feels his balance is better. Patient reports a few times last week his balance was "very good" but states it comes and goes. Patient states he is not having dizziness now in the evenings and only sometimes getting dizziness in the middle of the night now. Patient reports that he has been able to do more activities now than he has been able to do in some time.  Performed DGI twice-with and without SPC, ABC scale, DHI and 10 meter walking speed functional outcome measures. Discussed test results and compared to prior testing. Discussed progress towards goals and discharge plans. Patient  reports that he is independent with home exercise program. Reinforced to patient and his grandson safety with home exercise program and reinforced that body wall rolls should be done inside. Patient plans to continue to do his HEP daily upon discharge from PT services. Patient instructed to call if he feels his symptoms have worsened or if he feels he has declined and would need physical therapy services again. Patient in agreement with discharge from PT services at this time.   FUNCTIONAL OUTCOME MEASURES:  Results Comments  DHI 20/100 low perception of handicap  ABC Scale 91.25% Normal  DGI  19/24 with SPC Moderate impairment  DGI 21/24 without SPC Mild impairment  10 meter Walking Speed 0.71 m/sec Within normal values as compared to age and gender normative values     [facility] PT Assessment - 09/16/17 10:25     Dynamic Gait Index   Level Surface  Mild Impairment    Change in Gait Speed  Mild Impairment    Gait with Horizontal Head Turns  Mild Impairment    Gait with Vertical Head Turns  Mild Impairment    Gait and Pivot Turn  Normal    Step Over Obstacle  Normal    Step Around Obstacles  Normal    Steps  Mild Impairment    Total Score  19       PT Education - 09/19/17 1447    Education provided  Yes    Education Details  discussed functional outcome testing and discussed progress towards goals; discussed discharge plans    Person(s) Educated  Patient;Other (comment) grandson    Methods  Explanation    Comprehension  Verbalized understanding       [facility] PT Assessment - 09/16/17 1051      Dynamic Gait Index   Level Surface  Mild Impairment    Change in Gait Speed  Normal    Gait with Horizontal Head Turns  Normal    Gait with Vertical Head Turns  Normal    Gait and Pivot Turn  Normal    Step Over Obstacle  Mild Impairment    Step Around Obstacles  Normal    Steps  Mild Impairment    Total Score  21    DGI comment:  patient reports that he feels his balance is better and  that he can do the test without his cane; retested without AD         PT Short Term Goals - 09/16/17 1112      PT SHORT TERM GOAL #1   Title  Patient will be independent with home exercise program for self management.     Time  4    Period  Weeks    Status  Achieved  PT Long Term Goals - 09/16/17 1109      PT LONG TERM GOAL #1   Title  Patient will reduce falls risk as indicated by Activities Specific Balance Confidence Scale (ABC) >67%.    Baseline  Scored 51.25% on 07/18/17; scored 91.2% on 09/16/17    Time  8    Period  Weeks    Status  Achieved      PT LONG TERM GOAL #2   Title  Patient will reduce perceived disability to low levels as indicated by <40 on Dizziness Handicap Inventory.    Baseline  scored 66/100 on 07/18/17; scored 20/100 on 09/16/17    Time  8    Period  Weeks    Status  Achieved      PT LONG TERM GOAL #3   Title  Patient will report 50% or greater improvement in his symptoms of dizziness and imbalance with provoking motions or positions by    Baseline  Patient reports 60-65% improvement in his overall symptoms of dizziness and imbalance. Patient states he has noticed a difference and has been able to do more things around the home including worked in the yard and shop for 4 hours yesterday.     Time  8    Period  Weeks    Status  Achieved      PT LONG TERM GOAL #4   Title  Patient will demonstrate reduced falls risk as evidenced by Dynamic Gait Index (DGI) >20/24.    Baseline  scored 18/24 on 07/18/17; scored 19/24 with use of AD and 21/24 with no AD on 09/16/17.    Time  8    Period  Weeks    Status  Achieved      Patient will benefit from skilled therapeutic intervention in order to improve the following deficits and impairments:  Decreased balance, Decreased endurance, Decreased mobility, Difficulty walking, Dizziness, Decreased activity tolerance, Decreased strength, Postural dysfunction, Pain  Visit Diagnosis: Dizziness and giddiness  Muscle  weakness (generalized)  Difficulty in walking, not elsewhere classified  Plan - 09/19/17 1447    Clinical Impression Statement  Patient has progressed very well with physical therapy services meeting 1/1 short term and 4/4 long term goals. Patient improved from 51% to 91% on the ABC scale which demonstrates decreased falls risk. Patient improved from 66/100 to 20/100 indicating low perception of handicap on the Dizziness Handicap Inventory. Patient improved from 18/24 to 21/24 without AD on the DGI. Patient reports that he has been able to resume his prior activities including working in his shop and the yard. Discussed pacing of activity and to take rest breaks and stay hydrated especially during the summer months. Patient is independent with home exercise program and plans to continue doing exercise program daily upon discharge from physical therapy. Patient reports that he feels ready for discharge from therapy services and is happy with the progress he has made. Will discharge patient from PT services this date with all goals met.     Rehab Potential  Fair    Clinical Impairments Affecting Rehab Potential  positive indicators: family support, motivated   negative indicators: chronicity, age, co-morbidities    PT Frequency  2x / week    PT Duration  8 weeks    PT Treatment/Interventions  Canalith Repostioning;Gait training;Stair training;Functional mobility training;Neuromuscular re-education;Balance training;Therapeutic exercise;Therapeutic activities;Patient/family education;Vestibular    PT Next Visit Plan  --    PT Home Exercise Plan  VOR X 1 1 minute reps in  sitting with conflicting background, feet together and semi-tandem stance progressions with horizontal and vertical head turns and body turns and slow marching with one UE support and sit to stand with one UE support    Consulted and Agree with Plan of Care  Patient;Family member/caregiver    Family Member Consulted  daughter         Problem List Patient Active Problem List   Diagnosis Date Noted  . MDS (myelodysplastic syndrome), low grade ([place]) 10/18/2016  . Anemia of chronic disease 10/21/2015  . Hemochromatosis 10/25/2014  . BP (high blood pressure) 10/21/2014  . CA of prostate ([place]) 10/21/2014  . Cancer of testicle ([place]) 10/21/2014  . Anemia 08/24/2014  . Dizziness 09/14/2013  . Difficulty in walking 09/14/2013  . 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 A-fib ([place]) 06/24/2013  . MI (mitral incompetence) 06/24/2013  . Absolute anemia 06/23/2013  . Arthritis, degenerative 06/23/2013  . Essential (primary) hypertension 06/23/2013   Lady Deutscher PT, DPT [phone] Lady Deutscher 09/19/2017, 3:15 PM  [place] MAIN [facility] SERVICES [address] [place], [place], [address] Phone: [phone]   Fax:  [phone]  Name: QUIENTIN JENT MRN: [id number] Date of Birth: Oct 06, 1920

## 2017-10-24 ENCOUNTER — Other Ambulatory Visit: Payer: Self-pay

## 2017-10-24 ENCOUNTER — Inpatient Hospital Stay: Payer: Medicare Other

## 2017-10-24 ENCOUNTER — Inpatient Hospital Stay (HOSPITAL_BASED_OUTPATIENT_CLINIC_OR_DEPARTMENT_OTHER): Payer: Medicare Other | Admitting: Internal Medicine

## 2017-10-24 ENCOUNTER — Inpatient Hospital Stay: Payer: Medicare Other | Attending: Internal Medicine

## 2017-10-24 VITALS — BP 151/84 | HR 96 | Temp 97.6°F | Resp 20 | Ht 69.0 in | Wt 147.0 lb

## 2017-10-24 DIAGNOSIS — D4620 Refractory anemia with excess of blasts, unspecified: Secondary | ICD-10-CM | POA: Insufficient documentation

## 2017-10-24 DIAGNOSIS — Z87891 Personal history of nicotine dependence: Secondary | ICD-10-CM | POA: Diagnosis not present

## 2017-10-24 DIAGNOSIS — Z8546 Personal history of malignant neoplasm of prostate: Secondary | ICD-10-CM | POA: Insufficient documentation

## 2017-10-24 DIAGNOSIS — N189 Chronic kidney disease, unspecified: Secondary | ICD-10-CM | POA: Diagnosis not present

## 2017-10-24 DIAGNOSIS — Z7982 Long term (current) use of aspirin: Secondary | ICD-10-CM

## 2017-10-24 DIAGNOSIS — I4891 Unspecified atrial fibrillation: Secondary | ICD-10-CM | POA: Diagnosis not present

## 2017-10-24 DIAGNOSIS — R5383 Other fatigue: Secondary | ICD-10-CM | POA: Insufficient documentation

## 2017-10-24 DIAGNOSIS — D638 Anemia in other chronic diseases classified elsewhere: Secondary | ICD-10-CM

## 2017-10-24 DIAGNOSIS — L03114 Cellulitis of left upper limb: Secondary | ICD-10-CM | POA: Diagnosis not present

## 2017-10-24 DIAGNOSIS — Z79899 Other long term (current) drug therapy: Secondary | ICD-10-CM

## 2017-10-24 DIAGNOSIS — I129 Hypertensive chronic kidney disease with stage 1 through stage 4 chronic kidney disease, or unspecified chronic kidney disease: Secondary | ICD-10-CM | POA: Diagnosis not present

## 2017-10-24 LAB — CBC WITH DIFFERENTIAL/PLATELET
BASOS ABS: 0.1 10*3/uL (ref 0–0.1)
BASOS PCT: 1 %
Eosinophils Absolute: 0.2 10*3/uL (ref 0–0.7)
Eosinophils Relative: 3 %
HCT: 30.1 % — ABNORMAL LOW (ref 40.0–52.0)
HEMOGLOBIN: 10.1 g/dL — AB (ref 13.0–18.0)
Lymphocytes Relative: 33 %
Lymphs Abs: 2.2 10*3/uL (ref 1.0–3.6)
MCH: 33 pg (ref 26.0–34.0)
MCHC: 33.5 g/dL (ref 32.0–36.0)
MCV: 98.6 fL (ref 80.0–100.0)
Monocytes Absolute: 0.7 10*3/uL (ref 0.2–1.0)
Monocytes Relative: 10 %
NEUTROS ABS: 3.6 10*3/uL (ref 1.4–6.5)
NEUTROS PCT: 53 %
Platelets: 366 10*3/uL (ref 150–440)
RBC: 3.05 MIL/uL — ABNORMAL LOW (ref 4.40–5.90)
RDW: 26.7 % — AB (ref 11.5–14.5)
WBC: 6.8 10*3/uL (ref 3.8–10.6)

## 2017-10-24 LAB — IRON AND TIBC
Iron: 112 ug/dL (ref 45–182)
SATURATION RATIOS: 48 % — AB (ref 17.9–39.5)
TIBC: 233 ug/dL — ABNORMAL LOW (ref 250–450)
UIBC: 121 ug/dL

## 2017-10-24 LAB — FERRITIN: Ferritin: 895 ng/mL — ABNORMAL HIGH (ref 24–336)

## 2017-10-24 NOTE — Progress Notes (Signed)
[place] OFFICE PROGRESS NOTE  Patient Care Team: [name], MD as PCP - General (Family Medicine)   SUMMARY OF HEMATOLOGIC HISTORY:  # Anemia sec to ? CKD  Macrocytic ? MDS- no BMBx] on Procrit 20000 Units q ? 20M  # ? Hemochromatosis- heterozygous [ferritin 800-900s] - on surveillance.   INTERVAL HISTORY:   A very pleasant 84 year-old male patient who is fairly active for his age  Currently on procrit for anemia secondary to CKD is here for follow-up.  Intermittent Procrit/Aranesp.  In the interim patient had episode of left upper extremity cellulitis for which she has been treated with Keflex.  Symptoms improving.  Of note this was preceded by a fall.  X-rays were negative for any fractures.   Chronic Mild fatigue.  Not any worse.  No blood in stools black or stools.   REVIEW OF SYSTEMS:  A complete 10 point review of system is done which is negative except mentioned above/history of present illness.   PAST MEDICAL HISTORY :  Past Medical History:  Diagnosis Date  . Anemia   . Atrial fibrillation ([place])   . Cancer [facility])    Testicular  . Hemochromatosis 10/25/2014  . Hypertension   . Prostate cancer ([place])     PAST SURGICAL HISTORY :   Past Surgical History:  Procedure Laterality Date  . APPENDECTOMY    . CHOLECYSTECTOMY    . HAND SURGERY Bilateral   . INNER EAR SURGERY    . KYPHOPLASTY N/A 12/14/2015   Procedure: KYPHOPLASTY  L2;  Surgeon: Hessie Knows, MD;  Location: [facility] ORS;  Service: Orthopedics;  Laterality: N/A;  . PROSTATE SURGERY    . SURGERY SCROTAL / TESTICULAR Right     FAMILY HISTORY :   Family History  Problem Relation Age of Onset  . Hypertension Mother     SOCIAL HISTORY:   Social History   Tobacco Use  . Smoking status: Former Smoker    Types: Cigarettes    Last attempt to quit: 03/22/1958    Years since quitting: 59.6  . Smokeless tobacco: Never Used  Substance Use Topics  . Alcohol use: No  . Drug use: No     ALLERGIES:  is allergic to levofloxacin.  MEDICATIONS:  Current Outpatient Medications  Medication Sig Dispense Refill  . aspirin EC 81 MG tablet Take 81 mg by mouth once.     [name] b complex vitamins capsule Take 1 capsule by mouth daily.     [name] CALCIUM-MAGNESIUM-ZINC PO Take 1 capsule by mouth daily.     . cephALEXin (KEFLEX) 500 MG capsule Take 1 capsule by mouth 3 (three) times daily.    Mariane Baumgarten Calcium (STOOL SOFTENER PO) Take 1 capsule by mouth 2 (two) times daily.     [name] lisinopril-hydrochlorothiazide (PRINZIDE,ZESTORETIC) 10-12.5 MG tablet Take 1 tablet by mouth daily.     . Multiple Vitamins-Minerals (CENTRUM SILVER ADULT 50+) TABS Take by mouth.    . Potassium 99 MG TABS Take 1 tablet by mouth daily.     . Vitamin D, Cholecalciferol, 1000 units CAPS Take 1,000 Units by mouth daily.     No current facility-administered medications for this visit.     PHYSICAL EXAMINATION:   BP (!) 151/84   Pulse 96   Temp 97.6 F (36.4 C) (Tympanic)   Resp 20   Ht 5\' 9"  (1.753 m)   Wt 147 lb (66.7 kg)   BMI 21.71 kg/m   [employer]  10/24/17 1338  Weight: 147 lb (66.7 kg)    GENERAL: Well-nourished well-developed; Alert, no distress and comfortable. He is accompanied by his daughter.  He walks with a rolling walker. EYES: no pallor or icterus OROPHARYNX: no thrush or ulceration; good dentition  NECK: supple, no masses felt LYMPH:  no palpable lymphadenopathy in the cervical, axillary or inguinal regions LUNGS: clear to auscultation and  No wheeze or crackles HEART/CVS: regular rate & irregular rhythm and no murmurs; No lower extremity edema ABDOMEN:abdomen soft, non-tender and normal bowel sounds Musculoskeletal:no cyanosis of digits and no clubbing  PSYCH: alert & oriented x 3 with fluent speech NEURO: no focal motor/sensory deficits SKIN:  no rashes or significant lesions  LABORATORY DATA:  I have reviewed the data as listed    Component Value Date/Time   NA 141  04/25/2017 1350   NA 142 05/06/2013 0158   K 3.6 04/25/2017 1350   K 3.7 05/06/2013 0158   CL 106 04/25/2017 1350   CL 112 (H) 05/06/2013 0158   CO2 27 04/25/2017 1350   CO2 24 05/06/2013 0158   GLUCOSE 116 (H) 04/25/2017 1350   GLUCOSE 95 05/06/2013 0158   BUN 26 (H) 04/25/2017 1350   BUN 32 (H) 05/06/2013 0158   CREATININE 1.04 04/25/2017 1350   CREATININE 1.22 11/25/2013 1332   CALCIUM 9.3 04/25/2017 1350   CALCIUM 9.1 05/06/2013 0158   PROT 6.5 04/25/2017 1350   PROT 6.1 (L) 05/06/2013 0158   ALBUMIN 4.1 04/25/2017 1350   ALBUMIN 3.5 05/06/2013 0158   AST 22 04/25/2017 1350   AST 22 05/06/2013 0158   ALT 14 (L) 04/25/2017 1350   ALT 18 05/06/2013 0158   ALKPHOS 56 04/25/2017 1350   ALKPHOS 60 05/06/2013 0158   BILITOT 1.4 (H) 04/25/2017 1350   BILITOT 1.4 (H) 05/06/2013 0158   GFRNONAA 59 (L) 04/25/2017 1350   GFRNONAA 52 (L) 11/25/2013 1332   GFRAA >60 04/25/2017 1350   GFRAA >60 11/25/2013 1332    No results found for: SPEP, UPEP  Lab Results  Component Value Date   WBC 6.8 10/24/2017   NEUTROABS 3.6 10/24/2017   HGB 10.1 (L) 10/24/2017   HCT 30.1 (L) 10/24/2017   MCV 98.6 10/24/2017   PLT 366 10/24/2017      Chemistry      Component Value Date/Time   NA 141 04/25/2017 1350   NA 142 05/06/2013 0158   K 3.6 04/25/2017 1350   K 3.7 05/06/2013 0158   CL 106 04/25/2017 1350   CL 112 (H) 05/06/2013 0158   CO2 27 04/25/2017 1350   CO2 24 05/06/2013 0158   BUN 26 (H) 04/25/2017 1350   BUN 32 (H) 05/06/2013 0158   CREATININE 1.04 04/25/2017 1350   CREATININE 1.22 11/25/2013 1332      Component Value Date/Time   CALCIUM 9.3 04/25/2017 1350   CALCIUM 9.1 05/06/2013 0158   ALKPHOS 56 04/25/2017 1350   ALKPHOS 60 05/06/2013 0158   AST 22 04/25/2017 1350   AST 22 05/06/2013 0158   ALT 14 (L) 04/25/2017 1350   ALT 18 05/06/2013 0158   BILITOT 1.4 (H) 04/25/2017 1350   BILITOT 1.4 (H) 05/06/2013 0158       RADIOGRAPHIC STUDIES: I have personally  reviewed the radiological images as listed and agreed with the findings in the report. No results found.   ASSESSMENT & PLAN:   MDS (myelodysplastic syndrome), low grade (HCC) #  Anemia ?  Likely secondary to MDS. [  no previous BMBx]-Today hemoglobin is 10.1.  Asymptomatic.  Intermittent Aranesp; no Aranesp today.  # Hemochromatosis- Heterozygous-no evidence of endorgan dysfunction.  Awaiting iron levels from today.  No plan for any phlebotomy at this time especially in the context of his anemia.  #Chronic mild fatigue stable.  Not any worse.  # Left UE celluitis-improving.  On keflex   # Check CBC/aranesp- 4 months; iron studies ferritin; MD   Addendum: Patient's ferritin 800 saturation 48%.  Will inform patient.     Cammie Sickle, MD 10/26/2017 3:38 PM

## 2017-10-24 NOTE — Assessment & Plan Note (Addendum)
#    Anemia ?  Likely secondary to MDS. [no previous BMBx]-Today hemoglobin is 10.1.  Asymptomatic.  Intermittent Aranesp; no Aranesp today.  # Hemochromatosis- Heterozygous-no evidence of endorgan dysfunction.  Awaiting iron levels from today.  No plan for any phlebotomy at this time especially in the context of his anemia.  #Chronic mild fatigue stable.  Not any worse.  # Left UE celluitis-improving.  On keflex   # Check CBC/aranesp- 4 months; iron studies ferritin; MD   Addendum: Patient's ferritin 800 saturation 48%.  Will inform patient.

## 2017-10-26 ENCOUNTER — Telehealth: Payer: Self-pay | Admitting: Internal Medicine

## 2017-10-26 NOTE — Telephone Encounter (Signed)
Please inform patient/daughter that iron numbers are better than last visit.  No new recommendations follow-up as planned

## 2017-10-27 NOTE — Telephone Encounter (Signed)
Patient notified of these results and Dr. B's recommendations.   

## 2018-02-20 ENCOUNTER — Inpatient Hospital Stay (HOSPITAL_BASED_OUTPATIENT_CLINIC_OR_DEPARTMENT_OTHER): Payer: Medicare Other | Admitting: Internal Medicine

## 2018-02-20 ENCOUNTER — Other Ambulatory Visit: Payer: Self-pay

## 2018-02-20 ENCOUNTER — Inpatient Hospital Stay: Payer: Medicare Other | Attending: Internal Medicine

## 2018-02-20 ENCOUNTER — Inpatient Hospital Stay: Payer: Medicare Other

## 2018-02-20 VITALS — BP 138/88 | HR 94 | Resp 16 | Wt 148.0 lb

## 2018-02-20 DIAGNOSIS — D469 Myelodysplastic syndrome, unspecified: Secondary | ICD-10-CM

## 2018-02-20 DIAGNOSIS — R5383 Other fatigue: Secondary | ICD-10-CM

## 2018-02-20 DIAGNOSIS — D4620 Refractory anemia with excess of blasts, unspecified: Secondary | ICD-10-CM

## 2018-02-20 DIAGNOSIS — D638 Anemia in other chronic diseases classified elsewhere: Secondary | ICD-10-CM

## 2018-02-20 LAB — COMPREHENSIVE METABOLIC PANEL
ALBUMIN: 4.1 g/dL (ref 3.5–5.0)
ALK PHOS: 54 U/L (ref 38–126)
ALT: 14 U/L (ref 0–44)
AST: 18 U/L (ref 15–41)
Anion gap: 7 (ref 5–15)
BUN: 29 mg/dL — AB (ref 8–23)
CHLORIDE: 107 mmol/L (ref 98–111)
CO2: 28 mmol/L (ref 22–32)
CREATININE: 0.93 mg/dL (ref 0.61–1.24)
Calcium: 9.5 mg/dL (ref 8.9–10.3)
GFR calc Af Amer: >60 mL/min (ref >60)
GLUCOSE: 112 mg/dL — AB (ref 70–99)
POTASSIUM: 3.7 mmol/L (ref 3.5–5.1)
Sodium: 142 mmol/L (ref 135–145)
Total Bilirubin: 1.4 mg/dL — ABNORMAL HIGH (ref 0.3–1.2)
Total Protein: 6.7 g/dL (ref 6.5–8.1)

## 2018-02-20 LAB — CBC WITH DIFFERENTIAL/PLATELET
Abs Immature Granulocytes: 0.03 K/uL (ref 0.00–0.07)
Basophils Absolute: 0.1 K/uL (ref 0.0–0.1)
Basophils Relative: 1 %
Eosinophils Absolute: 0.4 K/uL (ref 0.0–0.5)
Eosinophils Relative: 5 %
HCT: 28.9 % — ABNORMAL LOW (ref 39.0–52.0)
Hemoglobin: 9.7 g/dL — ABNORMAL LOW (ref 13.0–17.0)
Immature Granulocytes: 0 %
Lymphocytes Relative: 32 %
Lymphs Abs: 2.4 K/uL (ref 0.7–4.0)
MCH: 33.2 pg (ref 26.0–34.0)
MCHC: 33.6 g/dL (ref 30.0–36.0)
MCV: 99 fL (ref 80.0–100.0)
Monocytes Absolute: 0.8 K/uL (ref 0.1–1.0)
Monocytes Relative: 11 %
Neutro Abs: 3.7 K/uL (ref 1.7–7.7)
Neutrophils Relative %: 51 %
Platelets: 356 K/uL (ref 150–400)
RBC: 2.92 MIL/uL — ABNORMAL LOW (ref 4.22–5.81)
RDW: 22.4 % — ABNORMAL HIGH (ref 11.5–15.5)
WBC: 7.4 K/uL (ref 4.0–10.5)
nRBC: 0.4 % — ABNORMAL HIGH (ref 0.0–0.2)

## 2018-02-20 LAB — FERRITIN: FERRITIN: 1052 ng/mL — AB (ref 24–336)

## 2018-02-20 MED ORDER — DARBEPOETIN ALFA 200 MCG/0.4ML IJ SOSY
200.0000 ug | PREFILLED_SYRINGE | Freq: Once | INTRAMUSCULAR | Status: AC
  Administered 2018-02-20: 200 ug via SUBCUTANEOUS
  Filled 2018-02-20: qty 0.4

## 2018-02-20 NOTE — Progress Notes (Signed)
[name] OFFICE PROGRESS NOTE  Patient Care Team: Gerald Pink, MD as PCP - General (Family Medicine)   SUMMARY OF HEMATOLOGIC HISTORY:  # Anemia sec to ? CKD  Macrocytic ? MDS- no BMBx] on Procrit 20000 Units q ? 57M  # ? Hemochromatosis- heterozygous [ferritin 800-900s] - on surveillance.   INTERVAL HISTORY:   A very pleasant 84 year-old male patient who is fairly active for his age  Currently on procrit for anemia secondary to CKD is here for follow-up.  Intermittent Procrit/Aranesp.  Patient continues to be fairly active for his age.  Denies any unusual shortness of breath or cough.  Mild to moderate fatigue.  No blood in stools no black stools.   Review of Systems  Constitutional: Positive for malaise/fatigue. Negative for chills, diaphoresis, fever and weight loss.  HENT: Negative for nosebleeds and sore throat.   Eyes: Negative for double vision.  Respiratory: Negative for cough, hemoptysis, sputum production, shortness of breath and wheezing.   Cardiovascular: Negative for chest pain, palpitations, orthopnea and leg swelling.  Gastrointestinal: Negative for abdominal pain, blood in stool, constipation, diarrhea, heartburn, melena, nausea and vomiting.  Genitourinary: Negative for dysuria, frequency and urgency.  Musculoskeletal: Negative for back pain and joint pain.  Skin: Negative.  Negative for itching and rash.  Neurological: Negative for dizziness, tingling, focal weakness, weakness and headaches.  Endo/Heme/Allergies: Does not bruise/bleed easily.  Psychiatric/Behavioral: Negative for depression. The patient is not nervous/anxious and does not have insomnia.      PAST MEDICAL HISTORY :  Past Medical History:  Diagnosis Date  . Anemia   . Atrial fibrillation ([name])   . Cancer [facility])    Testicular  . Hemochromatosis 10/25/2014  . Hypertension   . Prostate cancer ([place])     PAST SURGICAL HISTORY :   Past Surgical History:  Procedure Laterality  Date  . APPENDECTOMY    . CHOLECYSTECTOMY    . HAND SURGERY Bilateral   . INNER EAR SURGERY    . KYPHOPLASTY N/A 12/14/2015   Procedure: KYPHOPLASTY  L2;  Surgeon: Hessie Knows, MD;  Location: [facility] ORS;  Service: Orthopedics;  Laterality: N/A;  . PROSTATE SURGERY    . SURGERY SCROTAL / TESTICULAR Right     FAMILY HISTORY :   Family History  Problem Relation Age of Onset  . Hypertension Mother     SOCIAL HISTORY:   Social History   Tobacco Use  . Smoking status: Former Smoker    Types: Cigarettes    Last attempt to quit: 03/22/1958    Years since quitting: 59.9  . Smokeless tobacco: Never Used  Substance Use Topics  . Alcohol use: No  . Drug use: No    ALLERGIES:  is allergic to levofloxacin.  MEDICATIONS:  Current Outpatient Medications  Medication Sig Dispense Refill  . aspirin EC 81 MG tablet Take 81 mg by mouth once.     [name] b complex vitamins capsule Take 1 capsule by mouth daily.     [name] CALCIUM-MAGNESIUM-ZINC PO Take 1 capsule by mouth daily.     Mariane Baumgarten Calcium (STOOL SOFTENER PO) Take 1 capsule by mouth 2 (two) times daily.     [name] lisinopril-hydrochlorothiazide (PRINZIDE,ZESTORETIC) 10-12.5 MG tablet Take 1 tablet by mouth daily.     . Multiple Vitamins-Minerals (CENTRUM SILVER ADULT 50+) TABS Take by mouth.    . Potassium 99 MG TABS Take 1 tablet by mouth daily.     . Vitamin D, Cholecalciferol, 1000 units CAPS  Take 1,000 Units by mouth daily.     No current facility-administered medications for this visit.     PHYSICAL EXAMINATION:   BP 138/88   Pulse 94   Resp 16   Wt 148 lb (67.1 kg)   BMI 21.86 kg/m   Filed Weights   02/20/18 1330  Weight: 148 lb (67.1 kg)    Physical Exam  Constitutional: He is oriented to person, place, and time and well-developed, well-nourished, and in no distress.  Elderly male patient; using rolling walker.  Accompanied by his daughter.  Looks younger than his stated age.  HENT:  Head: Normocephalic and atraumatic.   Mouth/Throat: Oropharynx is clear and moist. No oropharyngeal exudate.  Eyes: Pupils are equal, round, and reactive to light.  Neck: Normal range of motion. Neck supple.  Cardiovascular: Normal rate and regular rhythm.  Pulmonary/Chest: No respiratory distress. He has no wheezes.  Abdominal: Soft. Bowel sounds are normal. He exhibits no distension and no mass. There is no tenderness. There is no rebound and no guarding.  Musculoskeletal: Normal range of motion. He exhibits no edema or tenderness.  Neurological: He is alert and oriented to person, place, and time.  Skin: Skin is warm.  Psychiatric: Affect normal.     LABORATORY DATA:  I have reviewed the data as listed    Component Value Date/Time   NA 142 02/20/2018 1309   NA 142 05/06/2013 0158   K 3.7 02/20/2018 1309   K 3.7 05/06/2013 0158   CL 107 02/20/2018 1309   CL 112 (H) 05/06/2013 0158   CO2 28 02/20/2018 1309   CO2 24 05/06/2013 0158   GLUCOSE 112 (H) 02/20/2018 1309   GLUCOSE 95 05/06/2013 0158   BUN 29 (H) 02/20/2018 1309   BUN 32 (H) 05/06/2013 0158   CREATININE 0.93 02/20/2018 1309   CREATININE 1.22 11/25/2013 1332   CALCIUM 9.5 02/20/2018 1309   CALCIUM 9.1 05/06/2013 0158   PROT 6.7 02/20/2018 1309   PROT 6.1 (L) 05/06/2013 0158   ALBUMIN 4.1 02/20/2018 1309   ALBUMIN 3.5 05/06/2013 0158   AST 18 02/20/2018 1309   AST 22 05/06/2013 0158   ALT 14 02/20/2018 1309   ALT 18 05/06/2013 0158   ALKPHOS 54 02/20/2018 1309   ALKPHOS 60 05/06/2013 0158   BILITOT 1.4 (H) 02/20/2018 1309   BILITOT 1.4 (H) 05/06/2013 0158   GFRNONAA >60 02/20/2018 1309   GFRNONAA 52 (L) 11/25/2013 1332   GFRAA >60 02/20/2018 1309   GFRAA >60 11/25/2013 1332    No results found for: SPEP, UPEP  Lab Results  Component Value Date   WBC 7.4 02/20/2018   NEUTROABS 3.7 02/20/2018   HGB 9.7 (L) 02/20/2018   HCT 28.9 (L) 02/20/2018   MCV 99.0 02/20/2018   PLT 356 02/20/2018      Chemistry      Component Value Date/Time    NA 142 02/20/2018 1309   NA 142 05/06/2013 0158   K 3.7 02/20/2018 1309   K 3.7 05/06/2013 0158   CL 107 02/20/2018 1309   CL 112 (H) 05/06/2013 0158   CO2 28 02/20/2018 1309   CO2 24 05/06/2013 0158   BUN 29 (H) 02/20/2018 1309   BUN 32 (H) 05/06/2013 0158   CREATININE 0.93 02/20/2018 1309   CREATININE 1.22 11/25/2013 1332      Component Value Date/Time   CALCIUM 9.5 02/20/2018 1309   CALCIUM 9.1 05/06/2013 0158   ALKPHOS 54 02/20/2018 1309   ALKPHOS 60  05/06/2013 0158   AST 18 02/20/2018 1309   AST 22 05/06/2013 0158   ALT 14 02/20/2018 1309   ALT 18 05/06/2013 0158   BILITOT 1.4 (H) 02/20/2018 1309   BILITOT 1.4 (H) 05/06/2013 0158       RADIOGRAPHIC STUDIES: I have personally reviewed the radiological images as listed and agreed with the findings in the report. No results found.   ASSESSMENT & PLAN:   MDS (myelodysplastic syndrome), low grade (HCC) #  Anemia ?  Likely secondary to MDS. [no previous BMBx]- Today hemoglobin is 9.7.  Mild fatigue.  Intermittent Aranesp; proceed with Aranesp today.  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  Recent iron studies stable.  Monitor for now.  #Chronic mild fatigue stable.  Not any worse.  # DISPOSITION:  # Aranesp today. # Follow up in 4 months- MD; CBC/aranesp- 4 months; iron studies ferritin-Dr.B     Cammie Sickle, MD 02/20/2018 4:29 PM

## 2018-02-20 NOTE — Assessment & Plan Note (Addendum)
#    Anemia ?  Likely secondary to MDS. [no previous BMBx]- Today hemoglobin is 9.7.  Mild fatigue.  Intermittent Aranesp; proceed with Aranesp today.  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  Recent iron studies stable.  Monitor for now.  #Chronic mild fatigue stable.  Not any worse.  # DISPOSITION:  # Aranesp today. # Follow up in 4 months- MD; CBC/aranesp- 4 months; iron studies ferritin-Dr.B

## 2018-06-22 ENCOUNTER — Ambulatory Visit: Payer: Medicare Other

## 2018-06-22 ENCOUNTER — Ambulatory Visit: Payer: Medicare Other | Admitting: Internal Medicine

## 2018-06-22 ENCOUNTER — Other Ambulatory Visit: Payer: Medicare Other

## 2018-10-16 ENCOUNTER — Other Ambulatory Visit: Payer: Self-pay

## 2018-10-16 DIAGNOSIS — D4620 Refractory anemia with excess of blasts, unspecified: Secondary | ICD-10-CM

## 2018-10-19 ENCOUNTER — Other Ambulatory Visit: Payer: Self-pay

## 2018-10-19 ENCOUNTER — Inpatient Hospital Stay: Payer: Medicare Other | Attending: Internal Medicine

## 2018-10-19 ENCOUNTER — Encounter: Payer: Self-pay | Admitting: Internal Medicine

## 2018-10-19 ENCOUNTER — Inpatient Hospital Stay (HOSPITAL_BASED_OUTPATIENT_CLINIC_OR_DEPARTMENT_OTHER): Payer: Medicare Other | Admitting: Internal Medicine

## 2018-10-19 ENCOUNTER — Inpatient Hospital Stay: Payer: Medicare Other

## 2018-10-19 DIAGNOSIS — R531 Weakness: Secondary | ICD-10-CM | POA: Diagnosis not present

## 2018-10-19 DIAGNOSIS — Z87891 Personal history of nicotine dependence: Secondary | ICD-10-CM | POA: Insufficient documentation

## 2018-10-19 DIAGNOSIS — Z85820 Personal history of malignant melanoma of skin: Secondary | ICD-10-CM | POA: Diagnosis not present

## 2018-10-19 DIAGNOSIS — M549 Dorsalgia, unspecified: Secondary | ICD-10-CM | POA: Diagnosis not present

## 2018-10-19 DIAGNOSIS — Z79899 Other long term (current) drug therapy: Secondary | ICD-10-CM | POA: Diagnosis not present

## 2018-10-19 DIAGNOSIS — N189 Chronic kidney disease, unspecified: Secondary | ICD-10-CM | POA: Diagnosis not present

## 2018-10-19 DIAGNOSIS — D4620 Refractory anemia with excess of blasts, unspecified: Secondary | ICD-10-CM

## 2018-10-19 DIAGNOSIS — R5383 Other fatigue: Secondary | ICD-10-CM | POA: Diagnosis not present

## 2018-10-19 DIAGNOSIS — Z8547 Personal history of malignant neoplasm of testis: Secondary | ICD-10-CM | POA: Insufficient documentation

## 2018-10-19 DIAGNOSIS — I4891 Unspecified atrial fibrillation: Secondary | ICD-10-CM | POA: Insufficient documentation

## 2018-10-19 DIAGNOSIS — M2550 Pain in unspecified joint: Secondary | ICD-10-CM | POA: Diagnosis not present

## 2018-10-19 DIAGNOSIS — Z7982 Long term (current) use of aspirin: Secondary | ICD-10-CM | POA: Insufficient documentation

## 2018-10-19 DIAGNOSIS — Z8546 Personal history of malignant neoplasm of prostate: Secondary | ICD-10-CM | POA: Diagnosis not present

## 2018-10-19 DIAGNOSIS — I129 Hypertensive chronic kidney disease with stage 1 through stage 4 chronic kidney disease, or unspecified chronic kidney disease: Secondary | ICD-10-CM | POA: Diagnosis not present

## 2018-10-19 DIAGNOSIS — G8929 Other chronic pain: Secondary | ICD-10-CM | POA: Diagnosis not present

## 2018-10-19 DIAGNOSIS — D631 Anemia in chronic kidney disease: Secondary | ICD-10-CM | POA: Diagnosis not present

## 2018-10-19 LAB — IRON AND TIBC
Iron: 65 ug/dL (ref 45–182)
Saturation Ratios: 28 % (ref 17.9–39.5)
TIBC: 231 ug/dL — ABNORMAL LOW (ref 250–450)
UIBC: 166 ug/dL

## 2018-10-19 LAB — CBC WITH DIFFERENTIAL/PLATELET
Abs Immature Granulocytes: 0.03 10*3/uL (ref 0.00–0.07)
Basophils Absolute: 0.1 10*3/uL (ref 0.0–0.1)
Basophils Relative: 1 %
Eosinophils Absolute: 0.2 10*3/uL (ref 0.0–0.5)
Eosinophils Relative: 3 %
HCT: 27 % — ABNORMAL LOW (ref 39.0–52.0)
Hemoglobin: 9.3 g/dL — ABNORMAL LOW (ref 13.0–17.0)
Immature Granulocytes: 0 %
Lymphocytes Relative: 28 %
Lymphs Abs: 2 10*3/uL (ref 0.7–4.0)
MCH: 35.2 pg — ABNORMAL HIGH (ref 26.0–34.0)
MCHC: 34.4 g/dL (ref 30.0–36.0)
MCV: 102.3 fL — ABNORMAL HIGH (ref 80.0–100.0)
Monocytes Absolute: 0.7 10*3/uL (ref 0.1–1.0)
Monocytes Relative: 10 %
Neutro Abs: 4.1 10*3/uL (ref 1.7–7.7)
Neutrophils Relative %: 58 %
Platelets: 307 10*3/uL (ref 150–400)
RBC: 2.64 MIL/uL — ABNORMAL LOW (ref 4.22–5.81)
RDW: 23.4 % — ABNORMAL HIGH (ref 11.5–15.5)
WBC: 7.2 10*3/uL (ref 4.0–10.5)
nRBC: 0 % (ref 0.0–0.2)

## 2018-10-19 LAB — FERRITIN: Ferritin: 987 ng/mL — ABNORMAL HIGH (ref 24–336)

## 2018-10-19 MED ORDER — DARBEPOETIN ALFA 200 MCG/0.4ML IJ SOSY
200.0000 ug | PREFILLED_SYRINGE | Freq: Once | INTRAMUSCULAR | Status: AC
  Administered 2018-10-19: 200 ug via SUBCUTANEOUS
  Filled 2018-10-19: qty 0.4

## 2018-10-19 NOTE — Assessment & Plan Note (Addendum)
#  Anemia ?  Likely secondary to MDS. [no previous BMBx]- Today hemoglobin is 9.3; overall STABLE.  Hold bone marrow biopsy.  Mild fatigue.  Every 4 months Aranesp; proceed with Aranesp today.'  # Hemochromatosis- Heterozygous-no evidence of end organ dysfunction. STABLE: await Iron studies from today. Monitor for now.  Would not recommend phlebotomy as that will worsen anemia.  #Chronic mild fatigue- Stable.   #I spoke to patient's daughter on the phone regarding above plan.  She is in agreement.  # DISPOSITION:  # Aranesp today. # Follow up in 4 months- MD; CBC/aranesp- 4 months; iron studies ferritin-Dr.B

## 2018-10-19 NOTE — Progress Notes (Signed)
[place] OFFICE PROGRESS NOTE  Patient Care Team: [name], MD as PCP - General (Family Medicine)   SUMMARY OF HEMATOLOGIC HISTORY:  # Anemia sec to ? CKD  Macrocytic ? MDS- no BMBx] on Procrit 20000 Units q ? 61M  # ? Hemochromatosis- heterozygous [ferritin 800-900s] - on surveillance.   INTERVAL HISTORY:   A very pleasant 85 year-old male patient who is fairly active for his age currently on procrit for anemia secondary to CKD/MDS is here for follow-up  Patient complains of mild fatigue.  Denies any unusual cough or shortness of breath.  Chronic joint pains back pain not any worse.  No blood in stools or black or stools.  Review of Systems  Constitutional: Positive for malaise/fatigue. Negative for chills, diaphoresis, fever and weight loss.  HENT: Negative for nosebleeds and sore throat.   Eyes: Negative for double vision.  Respiratory: Negative for cough, hemoptysis, sputum production, shortness of breath and wheezing.   Cardiovascular: Negative for chest pain, palpitations, orthopnea and leg swelling.  Gastrointestinal: Negative for abdominal pain, blood in stool, constipation, diarrhea, heartburn, melena, nausea and vomiting.  Genitourinary: Negative for dysuria, frequency and urgency.  Musculoskeletal: Negative for back pain and joint pain.  Skin: Negative.  Negative for itching and rash.  Neurological: Negative for dizziness, tingling, focal weakness, weakness and headaches.  Endo/Heme/Allergies: Does not bruise/bleed easily.  Psychiatric/Behavioral: Negative for depression. The patient is not nervous/anxious and does not have insomnia.      PAST MEDICAL HISTORY :  Past Medical History:  Diagnosis Date  . Anemia   . Atrial fibrillation ([place])   . Cancer [facility])    Testicular  . Hemochromatosis 10/25/2014  . Hx of basal cell carcinoma 2008   R. ant. zygomatic/sideburn 2008, L preauricular 2019  . Hypertension   . Prostate cancer ([place])     PAST  SURGICAL HISTORY :   Past Surgical History:  Procedure Laterality Date  . APPENDECTOMY    . CHOLECYSTECTOMY    . HAND SURGERY Bilateral   . INNER EAR SURGERY    . KYPHOPLASTY N/A 12/14/2015   Procedure: KYPHOPLASTY  L2;  Surgeon: Hessie Knows, MD;  Location: [facility] ORS;  Service: Orthopedics;  Laterality: N/A;  . PROSTATE SURGERY    . SURGERY SCROTAL / TESTICULAR Right     FAMILY HISTORY :   Family History  Problem Relation Age of Onset  . Hypertension Mother     SOCIAL HISTORY:   Social History   Tobacco Use  . Smoking status: Former Smoker    Types: Cigarettes    Quit date: 03/22/1958    Years since quitting: 60.6  . Smokeless tobacco: Never Used  Substance Use Topics  . Alcohol use: No  . Drug use: No    ALLERGIES:  is allergic to levofloxacin.  MEDICATIONS:  Current Outpatient Medications  Medication Sig Dispense Refill  . aspirin EC 81 MG tablet Take 81 mg by mouth once.     [name] b complex vitamins capsule Take 1 capsule by mouth daily.     [name] CALCIUM-MAGNESIUM-ZINC PO Take 1 capsule by mouth daily.     Mariane Baumgarten Calcium (STOOL SOFTENER PO) Take 1 capsule by mouth 2 (two) times daily.     [name] lisinopril-hydrochlorothiazide (PRINZIDE,ZESTORETIC) 10-12.5 MG tablet Take 1 tablet by mouth daily.     . Multiple Vitamins-Minerals (CENTRUM SILVER ADULT 50+) TABS Take by mouth.    . Potassium 99 MG TABS Take 1 tablet by mouth daily.     [name]  Vitamin D, Cholecalciferol, 1000 units CAPS Take 1,000 Units by mouth daily.     No current facility-administered medications for this visit.    Facility-Administered Medications Ordered in Other Visits  Medication Dose Route Frequency Provider Last Rate Last Dose  . Darbepoetin Alfa (ARANESP) injection 200 mcg  200 mcg Subcutaneous Once Charlaine Dalton R, MD        PHYSICAL EXAMINATION:   BP 132/67 (BP Location: Left Arm)   Pulse 99   Temp 97.8 F (36.6 C) (Tympanic)   Resp 20   Ht _0  (1.753 m)   Wt 144 lb 14.4 oz (65.7 kg)    BMI 21.40 kg/m   Filed Weights   10/19/18 1253  Weight: 144 lb 14.4 oz (65.7 kg)    Physical Exam  Constitutional: He is oriented to person, place, and time and well-developed, well-nourished, and in no distress.  Elderly male patient; he is in a wheelchair.  He is alone.  Looks younger than his stated age.  HENT:  Head: Normocephalic and atraumatic.  Mouth/Throat: Oropharynx is clear and moist. No oropharyngeal exudate.  Eyes: Pupils are equal, round, and reactive to light.  Neck: Normal range of motion. Neck supple.  Cardiovascular: Normal rate and regular rhythm.  Pulmonary/Chest: Effort normal and breath sounds normal. No respiratory distress. He has no wheezes.  Abdominal: Soft. Bowel sounds are normal. He exhibits no distension and no mass. There is no abdominal tenderness. There is no rebound and no guarding.  Musculoskeletal: Normal range of motion.        General: No tenderness or edema.  Neurological: He is alert and oriented to person, place, and time.  Skin: Skin is warm.  Psychiatric: Affect normal.     LABORATORY DATA:  I have reviewed the data as listed    Component Value Date/Time   NA 142 02/20/2018 1309   NA 142 05/06/2013 0158   K 3.7 02/20/2018 1309   K 3.7 05/06/2013 0158   CL 107 02/20/2018 1309   CL 112 (H) 05/06/2013 0158   CO2 28 02/20/2018 1309   CO2 24 05/06/2013 0158   GLUCOSE 112 (H) 02/20/2018 1309   GLUCOSE 95 05/06/2013 0158   BUN 29 (H) 02/20/2018 1309   BUN 32 (H) 05/06/2013 0158   CREATININE 0.93 02/20/2018 1309   CREATININE 1.22 11/25/2013 1332   CALCIUM 9.5 02/20/2018 1309   CALCIUM 9.1 05/06/2013 0158   PROT 6.7 02/20/2018 1309   PROT 6.1 (L) 05/06/2013 0158   ALBUMIN 4.1 02/20/2018 1309   ALBUMIN 3.5 05/06/2013 0158   AST 18 02/20/2018 1309   AST 22 05/06/2013 0158   ALT 14 02/20/2018 1309   ALT 18 05/06/2013 0158   ALKPHOS 54 02/20/2018 1309   ALKPHOS 60 05/06/2013 0158   BILITOT 1.4 (H) 02/20/2018 1309   BILITOT 1.4  (H) 05/06/2013 0158   GFRNONAA >60 02/20/2018 1309   GFRNONAA 52 (L) 11/25/2013 1332   GFRAA >60 02/20/2018 1309   GFRAA >60 11/25/2013 1332    No results found for: SPEP, UPEP  Lab Results  Component Value Date   WBC 7.2 10/19/2018   NEUTROABS 4.1 10/19/2018   HGB 9.3 (L) 10/19/2018   HCT 27.0 (L) 10/19/2018   MCV 102.3 (H) 10/19/2018   PLT 307 10/19/2018      Chemistry      Component Value Date/Time   NA 142 02/20/2018 1309   NA 142 05/06/2013 0158   K 3.7 02/20/2018 1309   K 3.7  05/06/2013 0158   CL 107 02/20/2018 1309   CL 112 (H) 05/06/2013 0158   CO2 28 02/20/2018 1309   CO2 24 05/06/2013 0158   BUN 29 (H) 02/20/2018 1309   BUN 32 (H) 05/06/2013 0158   CREATININE 0.93 02/20/2018 1309   CREATININE 1.22 11/25/2013 1332      Component Value Date/Time   CALCIUM 9.5 02/20/2018 1309   CALCIUM 9.1 05/06/2013 0158   ALKPHOS 54 02/20/2018 1309   ALKPHOS 60 05/06/2013 0158   AST 18 02/20/2018 1309   AST 22 05/06/2013 0158   ALT 14 02/20/2018 1309   ALT 18 05/06/2013 0158   BILITOT 1.4 (H) 02/20/2018 1309   BILITOT 1.4 (H) 05/06/2013 0158       RADIOGRAPHIC STUDIES: I have personally reviewed the radiological images as listed and agreed with the findings in the report. No results found.   ASSESSMENT & PLAN:   MDS (myelodysplastic syndrome), low grade (HCC) #  Anemia ?  Likely secondary to MDS. [no previous BMBx]- Today hemoglobin is 9.3; overall STABLE.  Hold bone marrow biopsy.  Mild fatigue.  Every 4 months Aranesp; proceed with Aranesp today.'  # Hemochromatosis- Heterozygous-no evidence of end organ dysfunction. STABLE: await Iron studies from today. Monitor for now.  Would not recommend phlebotomy as that will worsen anemia.  #Chronic mild fatigue- Stable.   #I spoke to patient's daughter on the phone regarding above plan.  She is in agreement.  # DISPOSITION:  # Aranesp today. # Follow up in 4 months- MD; CBC/aranesp- 4 months; iron studies  ferritin-Dr.B     Cammie Sickle, MD 10/19/2018 1:17 PM

## 2019-02-16 ENCOUNTER — Other Ambulatory Visit: Payer: Self-pay | Admitting: *Deleted

## 2019-02-16 DIAGNOSIS — D4620 Refractory anemia with excess of blasts, unspecified: Secondary | ICD-10-CM

## 2019-02-22 ENCOUNTER — Inpatient Hospital Stay: Payer: Medicare Other

## 2019-02-22 ENCOUNTER — Other Ambulatory Visit: Payer: Self-pay

## 2019-02-22 ENCOUNTER — Inpatient Hospital Stay: Payer: Medicare Other | Attending: Internal Medicine

## 2019-02-22 ENCOUNTER — Inpatient Hospital Stay (HOSPITAL_BASED_OUTPATIENT_CLINIC_OR_DEPARTMENT_OTHER): Payer: Medicare Other | Admitting: Internal Medicine

## 2019-02-22 VITALS — BP 116/70 | HR 87 | Temp 97.7°F | Resp 18 | Wt 149.0 lb

## 2019-02-22 DIAGNOSIS — R42 Dizziness and giddiness: Secondary | ICD-10-CM | POA: Diagnosis not present

## 2019-02-22 DIAGNOSIS — R5383 Other fatigue: Secondary | ICD-10-CM | POA: Diagnosis not present

## 2019-02-22 DIAGNOSIS — Z85828 Personal history of other malignant neoplasm of skin: Secondary | ICD-10-CM | POA: Diagnosis not present

## 2019-02-22 DIAGNOSIS — G8929 Other chronic pain: Secondary | ICD-10-CM | POA: Insufficient documentation

## 2019-02-22 DIAGNOSIS — D4620 Refractory anemia with excess of blasts, unspecified: Secondary | ICD-10-CM

## 2019-02-22 DIAGNOSIS — N189 Chronic kidney disease, unspecified: Secondary | ICD-10-CM | POA: Diagnosis not present

## 2019-02-22 DIAGNOSIS — I129 Hypertensive chronic kidney disease with stage 1 through stage 4 chronic kidney disease, or unspecified chronic kidney disease: Secondary | ICD-10-CM | POA: Insufficient documentation

## 2019-02-22 DIAGNOSIS — Z79899 Other long term (current) drug therapy: Secondary | ICD-10-CM | POA: Insufficient documentation

## 2019-02-22 DIAGNOSIS — I4891 Unspecified atrial fibrillation: Secondary | ICD-10-CM | POA: Diagnosis not present

## 2019-02-22 DIAGNOSIS — D649 Anemia, unspecified: Secondary | ICD-10-CM

## 2019-02-22 DIAGNOSIS — M549 Dorsalgia, unspecified: Secondary | ICD-10-CM | POA: Diagnosis not present

## 2019-02-22 DIAGNOSIS — Z87891 Personal history of nicotine dependence: Secondary | ICD-10-CM | POA: Insufficient documentation

## 2019-02-22 DIAGNOSIS — D631 Anemia in chronic kidney disease: Secondary | ICD-10-CM | POA: Diagnosis not present

## 2019-02-22 DIAGNOSIS — C61 Malignant neoplasm of prostate: Secondary | ICD-10-CM | POA: Insufficient documentation

## 2019-02-22 LAB — CBC WITH DIFFERENTIAL/PLATELET
Abs Immature Granulocytes: 0.06 10*3/uL (ref 0.00–0.07)
Basophils Absolute: 0.1 10*3/uL (ref 0.0–0.1)
Basophils Relative: 1 %
Eosinophils Absolute: 0.3 10*3/uL (ref 0.0–0.5)
Eosinophils Relative: 4 %
HCT: 23.9 % — ABNORMAL LOW (ref 39.0–52.0)
Hemoglobin: 8 g/dL — ABNORMAL LOW (ref 13.0–17.0)
Immature Granulocytes: 1 %
Lymphocytes Relative: 26 %
Lymphs Abs: 2.1 10*3/uL (ref 0.7–4.0)
MCH: 33.6 pg (ref 26.0–34.0)
MCHC: 33.5 g/dL (ref 30.0–36.0)
MCV: 100.4 fL — ABNORMAL HIGH (ref 80.0–100.0)
Monocytes Absolute: 1 10*3/uL (ref 0.1–1.0)
Monocytes Relative: 12 %
Neutro Abs: 4.6 10*3/uL (ref 1.7–7.7)
Neutrophils Relative %: 56 %
Platelets: 408 10*3/uL — ABNORMAL HIGH (ref 150–400)
RBC: 2.38 MIL/uL — ABNORMAL LOW (ref 4.22–5.81)
RDW: 24.3 % — ABNORMAL HIGH (ref 11.5–15.5)
WBC: 8 10*3/uL (ref 4.0–10.5)
nRBC: 0.9 % — ABNORMAL HIGH (ref 0.0–0.2)

## 2019-02-22 LAB — FERRITIN: Ferritin: 1313 ng/mL — ABNORMAL HIGH (ref 24–336)

## 2019-02-22 LAB — IRON AND TIBC
Iron: 82 ug/dL (ref 45–182)
Saturation Ratios: 35 % (ref 17.9–39.5)
TIBC: 232 ug/dL — ABNORMAL LOW (ref 250–450)
UIBC: 150 ug/dL

## 2019-02-22 MED ORDER — DARBEPOETIN ALFA 200 MCG/0.4ML IJ SOSY
200.0000 ug | PREFILLED_SYRINGE | Freq: Once | INTRAMUSCULAR | Status: AC
  Administered 2019-02-22: 200 ug via SUBCUTANEOUS
  Filled 2019-02-22: qty 0.4

## 2019-02-22 NOTE — Progress Notes (Signed)
[place] OFFICE PROGRESS NOTE  Patient Care Team: [name], MD as PCP - General (Family Medicine)   SUMMARY OF HEMATOLOGIC HISTORY:  # Anemia sec to ? CKD  Macrocytic ? MDS- no BMBx] on Procrit 20000 Units q ? 74M  # ? Hemochromatosis- heterozygous [ferritin 800-900s] - on surveillance.   INTERVAL HISTORY:   A very pleasant 85 year-old male patient who is fairly active for his age currently on procrit for anemia secondary to CKD/MDS is here for follow-up.  Patient states that he is quite tired approximately 1 week after his Aranesp injection.  Otherwise denies any blood in stools or black or stools.  Chronic back pain.  No new cough or shortness of breath. Chronic dizziness secondary to vertigo status post evaluation. Review of Systems  Constitutional: Positive for malaise/fatigue. Negative for chills, diaphoresis, fever and weight loss.  HENT: Negative for nosebleeds and sore throat.   Eyes: Negative for double vision.  Respiratory: Negative for cough, hemoptysis, sputum production, shortness of breath and wheezing.   Cardiovascular: Negative for chest pain, palpitations, orthopnea and leg swelling.  Gastrointestinal: Negative for abdominal pain, blood in stool, constipation, diarrhea, heartburn, melena, nausea and vomiting.  Genitourinary: Negative for dysuria, frequency and urgency.  Musculoskeletal: Positive for back pain. Negative for joint pain.  Skin: Negative.  Negative for itching and rash.  Neurological: Positive for dizziness. Negative for tingling, focal weakness, weakness and headaches.  Endo/Heme/Allergies: Does not bruise/bleed easily.  Psychiatric/Behavioral: Negative for depression. The patient is not nervous/anxious and does not have insomnia.      PAST MEDICAL HISTORY :  Past Medical History:  Diagnosis Date  . Anemia   . Atrial fibrillation ([place])   . Cancer [facility])    Testicular  . Hemochromatosis 10/25/2014  . Hx of basal cell  carcinoma 2008   R. ant. zygomatic/sideburn 2008, L preauricular 2019  . Hypertension   . Prostate cancer ([place])     PAST SURGICAL HISTORY :   Past Surgical History:  Procedure Laterality Date  . APPENDECTOMY    . CHOLECYSTECTOMY    . HAND SURGERY Bilateral   . INNER EAR SURGERY    . KYPHOPLASTY N/A 12/14/2015   Procedure: KYPHOPLASTY  L2;  Surgeon: Hessie Knows, MD;  Location: [facility] ORS;  Service: Orthopedics;  Laterality: N/A;  . PROSTATE SURGERY    . SURGERY SCROTAL / TESTICULAR Right     FAMILY HISTORY :   Family History  Problem Relation Age of Onset  . Hypertension Mother     SOCIAL HISTORY:   Social History   Tobacco Use  . Smoking status: Former Smoker    Types: Cigarettes    Quit date: 03/22/1958    Years since quitting: 60.9  . Smokeless tobacco: Never Used  Substance Use Topics  . Alcohol use: No  . Drug use: No    ALLERGIES:  is allergic to levofloxacin.  MEDICATIONS:  Current Outpatient Medications  Medication Sig Dispense Refill  . aspirin EC 81 MG tablet Take 81 mg by mouth once.     [name] b complex vitamins capsule Take 1 capsule by mouth daily.     [name] CALCIUM-MAGNESIUM-ZINC PO Take 1 capsule by mouth daily.     Mariane Baumgarten Calcium (STOOL SOFTENER PO) Take 1 capsule by mouth 2 (two) times daily.     [name] lisinopril-hydrochlorothiazide (PRINZIDE,ZESTORETIC) 10-12.5 MG tablet Take 1 tablet by mouth daily.     . Multiple Vitamins-Minerals (CENTRUM SILVER ADULT 50+) TABS Take by  mouth.    . potassium gluconate (RA POTASSIUM GLUCONATE) 595 (99 K) MG TABS tablet Take by mouth.    . Vitamin D, Cholecalciferol, 1000 units CAPS Take 1,000 Units by mouth daily.    . Potassium 99 MG TABS Take 1 tablet by mouth daily.      No current facility-administered medications for this visit.     PHYSICAL EXAMINATION:   BP 116/70 (BP Location: Left Arm, Patient Position: Sitting)   Pulse 87   Temp 97.7 F (36.5 C) (Tympanic)   Resp 18   Wt 149 lb (67.6 kg)   BMI 22.00  kg/m   Filed Weights   02/22/19 1322  Weight: 149 lb (67.6 kg)    Physical Exam  Constitutional: He is oriented to person, place, and time and well-developed, well-nourished, and in no distress.  Elderly male patient; he is in a wheelchair.  He is alone.  Looks younger than his stated age.  HENT:  Head: Normocephalic and atraumatic.  Mouth/Throat: Oropharynx is clear and moist. No oropharyngeal exudate.  Eyes: Pupils are equal, round, and reactive to light.  Neck: Normal range of motion. Neck supple.  Cardiovascular: Normal rate and regular rhythm.  Pulmonary/Chest: Effort normal and breath sounds normal. No respiratory distress. He has no wheezes.  Abdominal: Soft. Bowel sounds are normal. He exhibits no distension and no mass. There is no abdominal tenderness. There is no rebound and no guarding.  Musculoskeletal: Normal range of motion.        General: No tenderness or edema.  Neurological: He is alert and oriented to person, place, and time.  Skin: Skin is warm.  Psychiatric: Affect normal.     LABORATORY DATA:  I have reviewed the data as listed    Component Value Date/Time   NA 142 02/20/2018 1309   NA 142 05/06/2013 0158   K 3.7 02/20/2018 1309   K 3.7 05/06/2013 0158   CL 107 02/20/2018 1309   CL 112 (H) 05/06/2013 0158   CO2 28 02/20/2018 1309   CO2 24 05/06/2013 0158   GLUCOSE 112 (H) 02/20/2018 1309   GLUCOSE 95 05/06/2013 0158   BUN 29 (H) 02/20/2018 1309   BUN 32 (H) 05/06/2013 0158   CREATININE 0.93 02/20/2018 1309   CREATININE 1.22 11/25/2013 1332   CALCIUM 9.5 02/20/2018 1309   CALCIUM 9.1 05/06/2013 0158   PROT 6.7 02/20/2018 1309   PROT 6.1 (L) 05/06/2013 0158   ALBUMIN 4.1 02/20/2018 1309   ALBUMIN 3.5 05/06/2013 0158   AST 18 02/20/2018 1309   AST 22 05/06/2013 0158   ALT 14 02/20/2018 1309   ALT 18 05/06/2013 0158   ALKPHOS 54 02/20/2018 1309   ALKPHOS 60 05/06/2013 0158   BILITOT 1.4 (H) 02/20/2018 1309   BILITOT 1.4 (H) 05/06/2013 0158    GFRNONAA >60 02/20/2018 1309   GFRNONAA 52 (L) 11/25/2013 1332   GFRAA >60 02/20/2018 1309   GFRAA >60 11/25/2013 1332    No results found for: SPEP, UPEP  Lab Results  Component Value Date   WBC 8.0 02/22/2019   NEUTROABS 4.6 02/22/2019   HGB 8.0 (L) 02/22/2019   HCT 23.9 (L) 02/22/2019   MCV 100.4 (H) 02/22/2019   PLT 408 (H) 02/22/2019      Chemistry      Component Value Date/Time   NA 142 02/20/2018 1309   NA 142 05/06/2013 0158   K 3.7 02/20/2018 1309   K 3.7 05/06/2013 0158   CL 107 02/20/2018 1309  CL 112 (H) 05/06/2013 0158   CO2 28 02/20/2018 1309   CO2 24 05/06/2013 0158   BUN 29 (H) 02/20/2018 1309   BUN 32 (H) 05/06/2013 0158   CREATININE 0.93 02/20/2018 1309   CREATININE 1.22 11/25/2013 1332      Component Value Date/Time   CALCIUM 9.5 02/20/2018 1309   CALCIUM 9.1 05/06/2013 0158   ALKPHOS 54 02/20/2018 1309   ALKPHOS 60 05/06/2013 0158   AST 18 02/20/2018 1309   AST 22 05/06/2013 0158   ALT 14 02/20/2018 1309   ALT 18 05/06/2013 0158   BILITOT 1.4 (H) 02/20/2018 1309   BILITOT 1.4 (H) 05/06/2013 0158       RADIOGRAPHIC STUDIES: I have personally reviewed the radiological images as listed and agreed with the findings in the report. No results found.   ASSESSMENT & PLAN:   MDS (myelodysplastic syndrome), low grade (HCC) #  Anemia ?  Likely secondary to MDS. [no previous BMBx]- Today hemoglobin is 8.0; currently on Aranesp.  #Long discussion with the patient and daughter regarding worsening anemia with hemoglobin 8.  Discussed regarding a bone marrow biopsy for further evaluation-which would be both therapeutic and prognostic.  No decisions made today.  #Worsening fatigue post Aranesp-question etiology.  Question drug-induced versus others.  Wants to go back to Procrit/Retacrit.  Understand that he will have visits more frequently.  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  Stable  # Dizzyness-  veritgo-STABLE.     #I spoke to patient's daughter  regarding above plan.  She is in agreement.  # DISPOSITION:  # Aranesp today. # Follow up in 2 months- MD; CBC/cmp- retacrit ; -Dr.B     Cammie Sickle, MD 02/22/2019 2:42 PM

## 2019-02-22 NOTE — Assessment & Plan Note (Addendum)
#  Anemia ?  Likely secondary to MDS. [no previous BMBx]- Today hemoglobin is 8.0; currently on Aranesp.  #Long discussion with the patient and daughter regarding worsening anemia with hemoglobin 8.  Discussed regarding a bone marrow biopsy for further evaluation-which would be both therapeutic and prognostic.  No decisions made today.  #Worsening fatigue post Aranesp-question etiology.  Question drug-induced versus others.  Wants to go back to Procrit/Retacrit.  Understand that he will have visits more frequently.  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  Stable  # Dizzyness- veritgo-STABLE.     #I spoke to patient's daughter  regarding above plan.  She is in agreement.  # DISPOSITION:  # Aranesp today. # Follow up in 2 months- MD; CBC/cmp- retacrit ; -Dr.B

## 2019-02-22 NOTE — Progress Notes (Signed)
Patient reports severe vertigo and fatigue

## 2019-04-23 ENCOUNTER — Other Ambulatory Visit: Payer: Self-pay

## 2019-04-23 ENCOUNTER — Encounter: Payer: Self-pay | Admitting: Internal Medicine

## 2019-04-26 ENCOUNTER — Inpatient Hospital Stay: Payer: Medicare Other | Attending: Internal Medicine

## 2019-04-26 ENCOUNTER — Inpatient Hospital Stay: Payer: Medicare Other

## 2019-04-26 ENCOUNTER — Inpatient Hospital Stay (HOSPITAL_BASED_OUTPATIENT_CLINIC_OR_DEPARTMENT_OTHER): Payer: Medicare Other | Admitting: Internal Medicine

## 2019-04-26 ENCOUNTER — Other Ambulatory Visit: Payer: Self-pay

## 2019-04-26 DIAGNOSIS — D631 Anemia in chronic kidney disease: Secondary | ICD-10-CM | POA: Diagnosis not present

## 2019-04-26 DIAGNOSIS — Z87891 Personal history of nicotine dependence: Secondary | ICD-10-CM | POA: Diagnosis not present

## 2019-04-26 DIAGNOSIS — R42 Dizziness and giddiness: Secondary | ICD-10-CM | POA: Diagnosis not present

## 2019-04-26 DIAGNOSIS — R5383 Other fatigue: Secondary | ICD-10-CM | POA: Insufficient documentation

## 2019-04-26 DIAGNOSIS — N183 Chronic kidney disease, stage 3 unspecified: Secondary | ICD-10-CM | POA: Insufficient documentation

## 2019-04-26 DIAGNOSIS — D469 Myelodysplastic syndrome, unspecified: Secondary | ICD-10-CM | POA: Diagnosis present

## 2019-04-26 DIAGNOSIS — Z8546 Personal history of malignant neoplasm of prostate: Secondary | ICD-10-CM | POA: Diagnosis not present

## 2019-04-26 DIAGNOSIS — I4891 Unspecified atrial fibrillation: Secondary | ICD-10-CM | POA: Insufficient documentation

## 2019-04-26 DIAGNOSIS — Z85828 Personal history of other malignant neoplasm of skin: Secondary | ICD-10-CM | POA: Diagnosis not present

## 2019-04-26 DIAGNOSIS — D4620 Refractory anemia with excess of blasts, unspecified: Secondary | ICD-10-CM | POA: Diagnosis not present

## 2019-04-26 DIAGNOSIS — I129 Hypertensive chronic kidney disease with stage 1 through stage 4 chronic kidney disease, or unspecified chronic kidney disease: Secondary | ICD-10-CM | POA: Diagnosis not present

## 2019-04-26 DIAGNOSIS — D649 Anemia, unspecified: Secondary | ICD-10-CM

## 2019-04-26 DIAGNOSIS — Z79899 Other long term (current) drug therapy: Secondary | ICD-10-CM | POA: Insufficient documentation

## 2019-04-26 DIAGNOSIS — Z7982 Long term (current) use of aspirin: Secondary | ICD-10-CM | POA: Diagnosis not present

## 2019-04-26 LAB — CBC WITH DIFFERENTIAL/PLATELET
Abs Immature Granulocytes: 0.02 10*3/uL (ref 0.00–0.07)
Basophils Absolute: 0.1 10*3/uL (ref 0.0–0.1)
Basophils Relative: 2 %
Eosinophils Absolute: 0.4 10*3/uL (ref 0.0–0.5)
Eosinophils Relative: 5 %
HCT: 28.6 % — ABNORMAL LOW (ref 39.0–52.0)
Hemoglobin: 9.2 g/dL — ABNORMAL LOW (ref 13.0–17.0)
Immature Granulocytes: 0 %
Lymphocytes Relative: 28 %
Lymphs Abs: 2.1 10*3/uL (ref 0.7–4.0)
MCH: 32.6 pg (ref 26.0–34.0)
MCHC: 32.2 g/dL (ref 30.0–36.0)
MCV: 101.4 fL — ABNORMAL HIGH (ref 80.0–100.0)
Monocytes Absolute: 0.8 10*3/uL (ref 0.1–1.0)
Monocytes Relative: 11 %
Neutro Abs: 4.1 10*3/uL (ref 1.7–7.7)
Neutrophils Relative %: 54 %
Platelets: 388 10*3/uL (ref 150–400)
RBC: 2.82 MIL/uL — ABNORMAL LOW (ref 4.22–5.81)
RDW: 22.2 % — ABNORMAL HIGH (ref 11.5–15.5)
WBC: 7.6 10*3/uL (ref 4.0–10.5)
nRBC: 0.3 % — ABNORMAL HIGH (ref 0.0–0.2)

## 2019-04-26 LAB — COMPREHENSIVE METABOLIC PANEL
ALT: 12 U/L (ref 0–44)
AST: 18 U/L (ref 15–41)
Albumin: 4.1 g/dL (ref 3.5–5.0)
Alkaline Phosphatase: 51 U/L (ref 38–126)
Anion gap: 9 (ref 5–15)
BUN: 31 mg/dL — ABNORMAL HIGH (ref 8–23)
CO2: 26 mmol/L (ref 22–32)
Calcium: 9.3 mg/dL (ref 8.9–10.3)
Chloride: 106 mmol/L (ref 98–111)
Creatinine, Ser: 0.98 mg/dL (ref 0.61–1.24)
GFR calc Af Amer: >60 mL/min (ref >60)
GFR calc non Af Amer: >60 mL/min (ref >60)
Glucose, Bld: 111 mg/dL — ABNORMAL HIGH (ref 70–99)
Potassium: 3.6 mmol/L (ref 3.5–5.1)
Sodium: 141 mmol/L (ref 135–145)
Total Bilirubin: 1.7 mg/dL — ABNORMAL HIGH (ref 0.3–1.2)
Total Protein: 6.6 g/dL (ref 6.5–8.1)

## 2019-04-26 LAB — FOLATE: Folate: 9.6 ng/mL (ref >5.9)

## 2019-04-26 LAB — VITAMIN B12: Vitamin B-12: 1121 pg/mL — ABNORMAL HIGH (ref 180–914)

## 2019-04-26 MED ORDER — EPOETIN ALFA-EPBX 10000 UNIT/ML IJ SOLN
20000.0000 [IU] | Freq: Once | INTRAMUSCULAR | Status: AC
  Administered 2019-04-26: 20000 [IU] via SUBCUTANEOUS
  Filled 2019-04-26: qty 2

## 2019-04-26 NOTE — Assessment & Plan Note (Addendum)
#  Anemia ?  Likely secondary to MDS. [no previous BMBx]- Today hemoglobin is 9.7 currently on Aranesp.  #Again reviewed the etiology of anemia-is unclear but clinically suggestive of MDS.  No bone marrow biopsy/patient preference.  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  Stable December 2020-iron saturation 36%.  # Dizzyness- veritgo-STABLE.     # I discussed regarding Covid-19 precautions.  I reviewed the vaccine effectiveness and potential side effects in detail.  Also discussed long-term effectiveness and safety profile are unclear at this time.  I discussed December, 2020 ASCO position statement-that all patients are recommended COVID-19 vaccinations [when available]-as long as they do not have allergy to components of the vaccine.  However, I think the benefits of the vaccination outweigh the potential risks. Re: CAREQ-14 vaccination.  For more information/scheduling recommend call [phone], 8:30am-4:30pm.   #I spoke to patient's daughter  regarding above plan.  She is in agreement.  # DISPOSITION:  # Retacrit today # in 1 month- possible Retacrit- labs- H&H # Follow up in 2 months- MD; CBC/cmp-possible retacrit ; -Dr.B

## 2019-04-26 NOTE — Patient Instructions (Signed)
Re: COVID-19 vaccination.  For more information/scheduling recommend call [place] [facility]- [phone], 8:30am-4:30pm.   

## 2019-04-26 NOTE — Progress Notes (Signed)
[place] OFFICE PROGRESS NOTE  Patient Care Team: [name], MD as PCP - General (Family Medicine)   SUMMARY OF HEMATOLOGIC HISTORY:  # Anemia sec to ? CKD  Macrocytic ? MDS- no BMBx] on Procrit 20000 Units q ? 70M; on aranesp every 2 months [poor to tolerance/fatigue- ] FEB 2021- switch to retacrit [  # ? Hemochromatosis- heterozygous [ferritin 800-900s] - on surveillance.   INTERVAL HISTORY:   A very pleasant 86 year-old male patient who is fairly active for his age currently on procrit for anemia secondary to CKD/MDS is here for follow-up.  Patient continues to complain of fatigue approximately week or so after Aranesp injection.  Otherwise denies any blood in stools or black or stools.    Chronic mild dizziness not any worse.  Review of Systems  Constitutional: Positive for malaise/fatigue. Negative for chills, diaphoresis, fever and weight loss.  HENT: Negative for nosebleeds and sore throat.   Eyes: Negative for double vision.  Respiratory: Negative for cough, hemoptysis, sputum production, shortness of breath and wheezing.   Cardiovascular: Negative for chest pain, palpitations, orthopnea and leg swelling.  Gastrointestinal: Negative for abdominal pain, blood in stool, constipation, diarrhea, heartburn, melena, nausea and vomiting.  Genitourinary: Negative for dysuria, frequency and urgency.  Musculoskeletal: Positive for back pain. Negative for joint pain.  Skin: Negative.  Negative for itching and rash.  Neurological: Positive for dizziness. Negative for tingling, focal weakness, weakness and headaches.  Endo/Heme/Allergies: Does not bruise/bleed easily.  Psychiatric/Behavioral: Negative for depression. The patient is not nervous/anxious and does not have insomnia.      PAST MEDICAL HISTORY :  Past Medical History:  Diagnosis Date  . Anemia   . Atrial fibrillation ([place])   . Cancer [facility])    Testicular  . Hemochromatosis 10/25/2014  . Hx of basal  cell carcinoma 2008   R. ant. zygomatic/sideburn 2008, L preauricular 2019  . Hypertension   . Prostate cancer ([place])     PAST SURGICAL HISTORY :   Past Surgical History:  Procedure Laterality Date  . APPENDECTOMY    . CHOLECYSTECTOMY    . HAND SURGERY Bilateral   . INNER EAR SURGERY    . KYPHOPLASTY N/A 12/14/2015   Procedure: KYPHOPLASTY  L2;  Surgeon: Hessie Knows, MD;  Location: [facility] ORS;  Service: Orthopedics;  Laterality: N/A;  . PROSTATE SURGERY    . SURGERY SCROTAL / TESTICULAR Right     FAMILY HISTORY :   Family History  Problem Relation Age of Onset  . Hypertension Mother     SOCIAL HISTORY:   Social History   Tobacco Use  . Smoking status: Former Smoker    Types: Cigarettes    Quit date: 03/22/1958    Years since quitting: 61.1  . Smokeless tobacco: Never Used  Substance Use Topics  . Alcohol use: No  . Drug use: No    ALLERGIES:  is allergic to levofloxacin.  MEDICATIONS:  Current Outpatient Medications  Medication Sig Dispense Refill  . aspirin EC 81 MG tablet Take 81 mg by mouth once.     [name] b complex vitamins capsule Take 1 capsule by mouth daily.     [name] CALCIUM-MAGNESIUM-ZINC PO Take 1 capsule by mouth daily.     Mariane Baumgarten Calcium (STOOL SOFTENER PO) Take 1 capsule by mouth 2 (two) times daily.     [name] lisinopril-hydrochlorothiazide (PRINZIDE,ZESTORETIC) 10-12.5 MG tablet Take 1 tablet by mouth daily.     . Multiple Vitamins-Minerals (CENTRUM SILVER ADULT  50+) TABS Take by mouth.    . Potassium 99 MG TABS Take 1 tablet by mouth daily.     . potassium gluconate (RA POTASSIUM GLUCONATE) 595 (99 K) MG TABS tablet Take by mouth.    . Vitamin D, Cholecalciferol, 1000 units CAPS Take 1,000 Units by mouth daily.     No current facility-administered medications for this visit.   Facility-Administered Medications Ordered in Other Visits  Medication Dose Route Frequency Provider Last Rate Last Admin  . epoetin alfa-epbx (RETACRIT) injection 20,000 Units   20,000 Units Subcutaneous Once Charlaine Dalton R, MD        PHYSICAL EXAMINATION:   BP (!) 141/78   Pulse 78   Temp 98 F (36.7 C) (Tympanic)   Resp 18   Wt 146 lb (66.2 kg)   BMI 21.56 kg/m   Filed Weights   04/26/19 1418  Weight: 146 lb (66.2 kg)    Physical Exam  Constitutional: He is oriented to person, place, and time and well-developed, well-nourished, and in no distress.  Elderly male patient; he is in a wheelchair.  He is alone.  Looks younger than his stated age.  HENT:  Head: Normocephalic and atraumatic.  Mouth/Throat: Oropharynx is clear and moist. No oropharyngeal exudate.  Eyes: Pupils are equal, round, and reactive to light.  Cardiovascular: Normal rate and regular rhythm.  Pulmonary/Chest: Effort normal and breath sounds normal. No respiratory distress. He has no wheezes.  Abdominal: Soft. Bowel sounds are normal. He exhibits no distension and no mass. There is no abdominal tenderness. There is no rebound and no guarding.  Musculoskeletal:        General: No tenderness or edema. Normal range of motion.     Cervical back: Normal range of motion and neck supple.  Neurological: He is alert and oriented to person, place, and time.  Skin: Skin is warm.  Psychiatric: Affect normal.     LABORATORY DATA:  I have reviewed the data as listed    Component Value Date/Time   NA 141 04/26/2019 1352   NA 142 05/06/2013 0158   K 3.6 04/26/2019 1352   K 3.7 05/06/2013 0158   CL 106 04/26/2019 1352   CL 112 (H) 05/06/2013 0158   CO2 26 04/26/2019 1352   CO2 24 05/06/2013 0158   GLUCOSE 111 (H) 04/26/2019 1352   GLUCOSE 95 05/06/2013 0158   BUN 31 (H) 04/26/2019 1352   BUN 32 (H) 05/06/2013 0158   CREATININE 0.98 04/26/2019 1352   CREATININE 1.22 11/25/2013 1332   CALCIUM 9.3 04/26/2019 1352   CALCIUM 9.1 05/06/2013 0158   PROT 6.6 04/26/2019 1352   PROT 6.1 (L) 05/06/2013 0158   ALBUMIN 4.1 04/26/2019 1352   ALBUMIN 3.5 05/06/2013 0158   AST 18  04/26/2019 1352   AST 22 05/06/2013 0158   ALT 12 04/26/2019 1352   ALT 18 05/06/2013 0158   ALKPHOS 51 04/26/2019 1352   ALKPHOS 60 05/06/2013 0158   BILITOT 1.7 (H) 04/26/2019 1352   BILITOT 1.4 (H) 05/06/2013 0158   GFRNONAA >60 04/26/2019 1352   GFRNONAA 52 (L) 11/25/2013 1332   GFRAA >60 04/26/2019 1352   GFRAA >60 11/25/2013 1332    No results found for: SPEP, UPEP  Lab Results  Component Value Date   WBC 7.6 04/26/2019   NEUTROABS 4.1 04/26/2019   HGB 9.2 (L) 04/26/2019   HCT 28.6 (L) 04/26/2019   MCV 101.4 (H) 04/26/2019   PLT 388 04/26/2019  Chemistry      Component Value Date/Time   NA 141 04/26/2019 1352   NA 142 05/06/2013 0158   K 3.6 04/26/2019 1352   K 3.7 05/06/2013 0158   CL 106 04/26/2019 1352   CL 112 (H) 05/06/2013 0158   CO2 26 04/26/2019 1352   CO2 24 05/06/2013 0158   BUN 31 (H) 04/26/2019 1352   BUN 32 (H) 05/06/2013 0158   CREATININE 0.98 04/26/2019 1352   CREATININE 1.22 11/25/2013 1332      Component Value Date/Time   CALCIUM 9.3 04/26/2019 1352   CALCIUM 9.1 05/06/2013 0158   ALKPHOS 51 04/26/2019 1352   ALKPHOS 60 05/06/2013 0158   AST 18 04/26/2019 1352   AST 22 05/06/2013 0158   ALT 12 04/26/2019 1352   ALT 18 05/06/2013 0158   BILITOT 1.7 (H) 04/26/2019 1352   BILITOT 1.4 (H) 05/06/2013 0158       RADIOGRAPHIC STUDIES: I have personally reviewed the radiological images as listed and agreed with the findings in the report. No results found.   ASSESSMENT & PLAN:   MDS (myelodysplastic syndrome), low grade (HCC) #  Anemia ?  Likely secondary to MDS. [no previous BMBx]- Today hemoglobin is 9.7 currently on Aranesp.  #Again reviewed the etiology of anemia-is unclear but clinically suggestive of MDS.  No bone marrow biopsy/patient preference.  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  Stable December 2020-iron saturation 36%.  # Dizzyness- veritgo-STABLE.     # I discussed regarding  Covid-19 precautions.  I reviewed the vaccine effectiveness and potential side effects in detail.  Also discussed long-term effectiveness and safety profile are unclear at this time.  I discussed December, 2020 ASCO position statement-that all patients are recommended COVID-19 vaccinations [when available]-as long as they do not have allergy to components of the vaccine.  However, I think the benefits of the vaccination outweigh the potential risks. Re: BOFBP-10 vaccination.  For more information/scheduling recommend call [phone], 8:30am-4:30pm.   #I spoke to patient's daughter  regarding above plan.  She is in agreement.  # DISPOSITION:  # Retacrit today # in 1 month- possible Retacrit- labs- H&H # Follow up in 2 months- MD; CBC/cmp-possible retacrit ; -Dr.B     Cammie Sickle, MD 04/26/2019 2:52 PM

## 2019-04-28 LAB — KAPPA/LAMBDA LIGHT CHAINS
Kappa free light chain: 28.3 mg/L — ABNORMAL HIGH (ref 3.3–19.4)
Kappa, lambda light chain ratio: 1.16 (ref 0.26–1.65)
Lambda free light chains: 24.4 mg/L (ref 5.7–26.3)

## 2019-04-28 LAB — MULTIPLE MYELOMA PANEL, SERUM
Albumin SerPl Elph-Mcnc: 4 g/dL (ref 2.9–4.4)
Albumin/Glob SerPl: 1.7 (ref 0.7–1.7)
Alpha 1: 0.3 g/dL (ref 0.0–0.4)
Alpha2 Glob SerPl Elph-Mcnc: 0.6 g/dL (ref 0.4–1.0)
B-Globulin SerPl Elph-Mcnc: 0.9 g/dL (ref 0.7–1.3)
Gamma Glob SerPl Elph-Mcnc: 0.8 g/dL (ref 0.4–1.8)
Globulin, Total: 2.5 g/dL (ref 2.2–3.9)
IgA: 258 mg/dL (ref 61–437)
IgG (Immunoglobin G), Serum: 758 mg/dL (ref 603–1613)
IgM (Immunoglobulin M), Srm: 48 mg/dL (ref 15–143)
M Protein SerPl Elph-Mcnc: 0.2 g/dL — ABNORMAL HIGH
Total Protein ELP: 6.5 g/dL (ref 6.0–8.5)

## 2019-05-24 ENCOUNTER — Inpatient Hospital Stay: Payer: Medicare Other | Attending: Internal Medicine

## 2019-05-24 ENCOUNTER — Other Ambulatory Visit: Payer: Self-pay

## 2019-05-24 ENCOUNTER — Other Ambulatory Visit: Payer: Self-pay | Admitting: Internal Medicine

## 2019-05-24 ENCOUNTER — Inpatient Hospital Stay: Payer: Medicare Other

## 2019-05-24 VITALS — BP 138/62 | HR 95

## 2019-05-24 DIAGNOSIS — D4620 Refractory anemia with excess of blasts, unspecified: Secondary | ICD-10-CM

## 2019-05-24 DIAGNOSIS — S72002A Fracture of unspecified part of neck of left femur, initial encounter for closed fracture: Secondary | ICD-10-CM | POA: Diagnosis not present

## 2019-05-24 DIAGNOSIS — S72012A Unspecified intracapsular fracture of left femur, initial encounter for closed fracture: Secondary | ICD-10-CM | POA: Diagnosis not present

## 2019-05-24 DIAGNOSIS — D649 Anemia, unspecified: Secondary | ICD-10-CM

## 2019-05-24 LAB — HEMOGLOBIN AND HEMATOCRIT, BLOOD
HCT: 25.8 % — ABNORMAL LOW (ref 39.0–52.0)
Hemoglobin: 8.5 g/dL — ABNORMAL LOW (ref 13.0–17.0)

## 2019-05-24 MED ORDER — EPOETIN ALFA-EPBX 10000 UNIT/ML IJ SOLN
20000.0000 [IU] | Freq: Once | INTRAMUSCULAR | Status: AC
  Administered 2019-05-24: 20000 [IU] via SUBCUTANEOUS
  Filled 2019-05-24: qty 2

## 2019-05-27 ENCOUNTER — Inpatient Hospital Stay
Admission: EM | Admit: 2019-05-27 | Discharge: 2019-06-01 | DRG: 522 | Disposition: A | Discharge status: Home Health | Payer: Medicare Other | Attending: Internal Medicine | Admitting: Internal Medicine

## 2019-05-27 ENCOUNTER — Other Ambulatory Visit: Payer: Self-pay

## 2019-05-27 ENCOUNTER — Emergency Department: Payer: Medicare Other

## 2019-05-27 DIAGNOSIS — Z79899 Other long term (current) drug therapy: Secondary | ICD-10-CM

## 2019-05-27 DIAGNOSIS — Z8547 Personal history of malignant neoplasm of testis: Secondary | ICD-10-CM

## 2019-05-27 DIAGNOSIS — D631 Anemia in chronic kidney disease: Secondary | ICD-10-CM | POA: Diagnosis present

## 2019-05-27 DIAGNOSIS — Z87891 Personal history of nicotine dependence: Secondary | ICD-10-CM

## 2019-05-27 DIAGNOSIS — Z419 Encounter for procedure for purposes other than remedying health state, unspecified: Secondary | ICD-10-CM

## 2019-05-27 DIAGNOSIS — Z85828 Personal history of other malignant neoplasm of skin: Secondary | ICD-10-CM | POA: Diagnosis not present

## 2019-05-27 DIAGNOSIS — D469 Myelodysplastic syndrome, unspecified: Secondary | ICD-10-CM | POA: Diagnosis present

## 2019-05-27 DIAGNOSIS — I10 Essential (primary) hypertension: Secondary | ICD-10-CM | POA: Diagnosis not present

## 2019-05-27 DIAGNOSIS — W19XXXA Unspecified fall, initial encounter: Secondary | ICD-10-CM

## 2019-05-27 DIAGNOSIS — D638 Anemia in other chronic diseases classified elsewhere: Secondary | ICD-10-CM | POA: Diagnosis not present

## 2019-05-27 DIAGNOSIS — I480 Paroxysmal atrial fibrillation: Secondary | ICD-10-CM | POA: Diagnosis present

## 2019-05-27 DIAGNOSIS — M1712 Unilateral primary osteoarthritis, left knee: Secondary | ICD-10-CM | POA: Diagnosis present

## 2019-05-27 DIAGNOSIS — I4891 Unspecified atrial fibrillation: Secondary | ICD-10-CM | POA: Diagnosis present

## 2019-05-27 DIAGNOSIS — Z7982 Long term (current) use of aspirin: Secondary | ICD-10-CM | POA: Diagnosis not present

## 2019-05-27 DIAGNOSIS — W1830XA Fall on same level, unspecified, initial encounter: Secondary | ICD-10-CM | POA: Diagnosis present

## 2019-05-27 DIAGNOSIS — S72002A Fracture of unspecified part of neck of left femur, initial encounter for closed fracture: Secondary | ICD-10-CM | POA: Diagnosis present

## 2019-05-27 DIAGNOSIS — Z8249 Family history of ischemic heart disease and other diseases of the circulatory system: Secondary | ICD-10-CM | POA: Diagnosis not present

## 2019-05-27 DIAGNOSIS — I129 Hypertensive chronic kidney disease with stage 1 through stage 4 chronic kidney disease, or unspecified chronic kidney disease: Secondary | ICD-10-CM | POA: Diagnosis present

## 2019-05-27 DIAGNOSIS — D4620 Refractory anemia with excess of blasts, unspecified: Secondary | ICD-10-CM | POA: Diagnosis present

## 2019-05-27 DIAGNOSIS — D473 Essential (hemorrhagic) thrombocythemia: Secondary | ICD-10-CM | POA: Diagnosis present

## 2019-05-27 DIAGNOSIS — M25462 Effusion, left knee: Secondary | ICD-10-CM

## 2019-05-27 DIAGNOSIS — Y92002 Bathroom of unspecified non-institutional (private) residence single-family (private) house as the place of occurrence of the external cause: Secondary | ICD-10-CM

## 2019-05-27 DIAGNOSIS — N189 Chronic kidney disease, unspecified: Secondary | ICD-10-CM | POA: Diagnosis present

## 2019-05-27 DIAGNOSIS — G8918 Other acute postprocedural pain: Secondary | ICD-10-CM

## 2019-05-27 DIAGNOSIS — Z20822 Contact with and (suspected) exposure to covid-19: Secondary | ICD-10-CM | POA: Diagnosis present

## 2019-05-27 DIAGNOSIS — D72829 Elevated white blood cell count, unspecified: Secondary | ICD-10-CM | POA: Diagnosis present

## 2019-05-27 DIAGNOSIS — R9431 Abnormal electrocardiogram [ECG] [EKG]: Secondary | ICD-10-CM

## 2019-05-27 DIAGNOSIS — S72012A Unspecified intracapsular fracture of left femur, initial encounter for closed fracture: Secondary | ICD-10-CM | POA: Diagnosis present

## 2019-05-27 DIAGNOSIS — Z881 Allergy status to other antibiotic agents status: Secondary | ICD-10-CM | POA: Diagnosis not present

## 2019-05-27 DIAGNOSIS — Z8546 Personal history of malignant neoplasm of prostate: Secondary | ICD-10-CM | POA: Diagnosis not present

## 2019-05-27 LAB — CBC
HCT: 23.6 % — ABNORMAL LOW (ref 39.0–52.0)
Hemoglobin: 7.9 g/dL — ABNORMAL LOW (ref 13.0–17.0)
MCH: 32.8 pg (ref 26.0–34.0)
MCHC: 33.5 g/dL (ref 30.0–36.0)
MCV: 97.9 fL (ref 80.0–100.0)
Platelets: 458 10*3/uL — ABNORMAL HIGH (ref 150–400)
RBC: 2.41 MIL/uL — ABNORMAL LOW (ref 4.22–5.81)
RDW: 22.7 % — ABNORMAL HIGH (ref 11.5–15.5)
WBC: 12.6 10*3/uL — ABNORMAL HIGH (ref 4.0–10.5)
nRBC: 0.5 % — ABNORMAL HIGH (ref 0.0–0.2)

## 2019-05-27 LAB — BASIC METABOLIC PANEL
Anion gap: 9 (ref 5–15)
BUN: 29 mg/dL — ABNORMAL HIGH (ref 8–23)
CO2: 24 mmol/L (ref 22–32)
Calcium: 8.9 mg/dL (ref 8.9–10.3)
Chloride: 110 mmol/L (ref 98–111)
Creatinine, Ser: 0.98 mg/dL (ref 0.61–1.24)
GFR calc Af Amer: >60 mL/min (ref >60)
GFR calc non Af Amer: >60 mL/min (ref >60)
Glucose, Bld: 129 mg/dL — ABNORMAL HIGH (ref 70–99)
Potassium: 3.5 mmol/L (ref 3.5–5.1)
Sodium: 143 mmol/L (ref 135–145)

## 2019-05-27 LAB — SAMPLE TO BLOOD BANK

## 2019-05-27 LAB — PROTIME-INR
INR: 1.3 — ABNORMAL HIGH (ref 0.8–1.2)
Prothrombin Time: 16.4 seconds — ABNORMAL HIGH (ref 11.4–15.2)

## 2019-05-27 MED ORDER — ONDANSETRON HCL 4 MG/2ML IJ SOLN: 4.0000 mg | Freq: Four times a day (QID) | INTRAMUSCULAR | Status: DC | PRN

## 2019-05-27 MED ORDER — ACETAMINOPHEN 650 MG RE SUPP: 650.0000 mg | Freq: Four times a day (QID) | RECTAL | Status: DC | PRN

## 2019-05-27 MED ORDER — ACETAMINOPHEN 325 MG PO TABS
650.0000 mg | ORAL_TABLET | Freq: Four times a day (QID) | ORAL | Status: DC | PRN
  Administered 2019-05-27: 650 mg via ORAL
  Filled 2019-05-27: qty 2

## 2019-05-27 MED ORDER — TRAMADOL HCL 50 MG PO TABS
50.0000 mg | ORAL_TABLET | Freq: Four times a day (QID) | ORAL | Status: DC | PRN
  Administered 2019-05-28: 50 mg via ORAL
  Filled 2019-05-27: qty 1

## 2019-05-27 MED ORDER — HYDRALAZINE HCL 25 MG PO TABS: 25.0000 mg | ORAL_TABLET | Freq: Four times a day (QID) | ORAL | Status: DC | PRN

## 2019-05-27 MED ORDER — CEFAZOLIN SODIUM-DEXTROSE 1-4 GM/50ML-% IV SOLN
1.0000 g | Freq: Once | INTRAVENOUS | Status: AC
  Administered 2019-05-28: 14:00:00 1 g via INTRAVENOUS
  Filled 2019-05-27: qty 50

## 2019-05-27 MED ORDER — OXYCODONE HCL 5 MG PO TABS
5.0000 mg | ORAL_TABLET | ORAL | Status: DC | PRN
  Administered 2019-05-27 – 2019-05-31 (×3): 5 mg via ORAL
  Filled 2019-05-27 (×3): qty 1

## 2019-05-27 MED ORDER — MAGNESIUM CITRATE PO SOLN
1.0000 | Freq: Once | ORAL | Status: DC | PRN
  Filled 2019-05-27: qty 296

## 2019-05-27 MED ORDER — LABETALOL HCL 5 MG/ML IV SOLN: 10.0000 mg | INTRAVENOUS | Status: DC | PRN

## 2019-05-27 MED ORDER — MORPHINE SULFATE (PF) 2 MG/ML IV SOLN
2.0000 mg | INTRAVENOUS | Status: DC | PRN
  Administered 2019-05-27: 2 mg via INTRAVENOUS
  Filled 2019-05-27: qty 1

## 2019-05-27 MED ORDER — ACETAMINOPHEN 325 MG PO TABS
650.0000 mg | ORAL_TABLET | Freq: Four times a day (QID) | ORAL | Status: DC
  Administered 2019-05-27 – 2019-06-01 (×15): 650 mg via ORAL
  Filled 2019-05-27 (×16): qty 2

## 2019-05-27 MED ORDER — FENTANYL CITRATE (PF) 100 MCG/2ML IJ SOLN
50.0000 ug | Freq: Once | INTRAMUSCULAR | Status: AC
  Administered 2019-05-27: 50 ug via INTRAVENOUS
  Filled 2019-05-27: qty 2

## 2019-05-27 MED ORDER — SENNOSIDES-DOCUSATE SODIUM 8.6-50 MG PO TABS: 1.0000 | ORAL_TABLET | Freq: Every evening | ORAL | Status: DC | PRN

## 2019-05-27 MED ORDER — ONDANSETRON HCL 4 MG PO TABS: 4.0000 mg | ORAL_TABLET | Freq: Four times a day (QID) | ORAL | Status: DC | PRN

## 2019-05-27 MED ORDER — BISACODYL 5 MG PO TBEC: 5.0000 mg | DELAYED_RELEASE_TABLET | Freq: Every day | ORAL | Status: DC | PRN

## 2019-05-27 MED ORDER — SODIUM CHLORIDE 0.9 % IV SOLN: INTRAVENOUS | Status: DC

## 2019-05-27 MED ORDER — DOCUSATE SODIUM 100 MG PO CAPS
100.0000 mg | ORAL_CAPSULE | Freq: Two times a day (BID) | ORAL | Status: DC
  Administered 2019-05-27: 100 mg via ORAL
  Filled 2019-05-27: qty 1

## 2019-05-27 NOTE — Consult Note (Signed)
Reason for Consult: Left femoral neck fracture displaced Referring Physician: Dr. Josephine Igo is an 86 y.o. male.  HPI: Patient is a 86 year old male who I have seen in the past in the office for various orthopedic problems.  He has always come in using a cane in the past.  He does report some prodromal left hip pain prior to today.  He was turning to go to the bathroom and suffered a fall.  He was brought to the emergency room was found to have a displaced left femoral neck fracture with minimal degenerative change.  Past Medical History:  Diagnosis Date  . Anemia   . Atrial fibrillation ([place])   . Cancer [facility])    Testicular  . Hemochromatosis 10/25/2014  . Hx of basal cell carcinoma 2008   R. ant. zygomatic/sideburn 2008, L preauricular 2019  . Hypertension   . Prostate cancer [facility])     Past Surgical History:  Procedure Laterality Date  . APPENDECTOMY    . CHOLECYSTECTOMY    . HAND SURGERY Bilateral   . INNER EAR SURGERY    . KYPHOPLASTY N/A 12/14/2015   Procedure: KYPHOPLASTY  L2;  Surgeon: Hessie Knows, MD;  Location: [facility] ORS;  Service: Orthopedics;  Laterality: N/A;  . PROSTATE SURGERY    . SURGERY SCROTAL / TESTICULAR Right     Family History  Problem Relation Age of Onset  . Hypertension Mother     Social History:  reports that he quit smoking about 61 years ago. His smoking use included cigarettes. He has never used smokeless tobacco. He reports that he does not drink alcohol or use drugs.  Allergies:  Allergies  Allergen Reactions  . Levofloxacin Other (See Comments)    Taste alterations for about 1 month after taking med    Medications: I have reviewed the patient's current medications.  Results for orders placed or performed during the hospital encounter of 05/27/19 (from the past 48 hour(s))  Sample to Blood Bank     Status: None   Collection Time: 05/27/19  2:30 PM  Result Value Ref Range   Blood Bank Specimen SAMPLE AVAILABLE FOR TESTING     Sample Expiration      05/30/2019,2359 Performed at [facility], [address] [place], [place] [address]   CBC     Status: Abnormal   Collection Time: 05/27/19  2:30 PM  Result Value Ref Range   WBC 12.6 (H) 4.0 - 10.5 K/uL   RBC 2.41 (L) 4.22 - 5.81 MIL/uL   Hemoglobin 7.9 (L) 13.0 - 17.0 g/dL   HCT 23.6 (L) 39.0 - 52.0 %   MCV 97.9 80.0 - 100.0 fL   MCH 32.8 26.0 - 34.0 pg   MCHC 33.5 30.0 - 36.0 g/dL   RDW 22.7 (H) 11.5 - 15.5 %   Platelets 458 (H) 150 - 400 K/uL   nRBC 0.5 (H) 0.0 - 0.2 %    Comment: Performed at [facility], [address] [place], [place] [id number]  Basic metabolic panel     Status: Abnormal   Collection Time: 05/27/19  2:30 PM  Result Value Ref Range   Sodium 143 135 - 145 mmol/L   Potassium 3.5 3.5 - 5.1 mmol/L   Chloride 110 98 - 111 mmol/L   CO2 24 22 - 32 mmol/L   Glucose, Bld 129 (H) 70 - 99 mg/dL    Comment: Glucose reference range applies only to samples taken after fasting for at  least 8 hours.   BUN 29 (H) 8 - 23 mg/dL   Creatinine, Ser 0.98 0.61 - 1.24 mg/dL   Calcium 8.9 8.9 - 10.3 mg/dL   GFR calc non Af Amer >60 >60 mL/min   GFR calc Af Amer >60 >60 mL/min   Anion gap 9 5 - 15    Comment: Performed at [facility], [address] [place], [place] [address]  Protime-INR     Status: Abnormal   Collection Time: 05/27/19  2:30 PM  Result Value Ref Range   Prothrombin Time 16.4 (H) 11.4 - 15.2 seconds   INR 1.3 (H) 0.8 - 1.2    Comment: (NOTE) INR goal varies based on device and disease states. Performed at [facility], [address] [place], [place] [address]   Type and screen [place]     Status: None   Collection Time: 05/27/19  2:30 PM  Result Value Ref Range   ABO/RH(D) O POS    Antibody Screen NEG    Sample Expiration      05/30/2019,2359 Performed at [name], [address] [place],  [address]     DG Chest 1 View  Result  Date: 05/27/2019 CLINICAL DATA:  Recent fall with known left hip fracture, initial encounter EXAM: CHEST  1 VIEW COMPARISON:  05/05/2013 FINDINGS: Cardiac shadow is enlarged. Tortuous thoracic aorta with atherosclerotic calcifications is again noted. Mild interstitial changes are seen which may be related to a mild degree of CHF. No focal infiltrate or effusion is seen. No bony abnormality is noted. IMPRESSION: Mild increased interstitial changes which may be related to some vascular congestion and mild CHF. Electronically Signed   By: Inez Catalina M.D.   On: 05/27/2019 15:35   DG Hip Unilat W or Wo Pelvis 2-3 Views Left  Result Date: 05/27/2019 CLINICAL DATA:  Recent fall with hip pain, initial encounter EXAM: DG HIP (WITH OR WITHOUT PELVIS) 2-3V LEFT COMPARISON:  None. FINDINGS: Subcapital left femoral neck fracture is noted with impaction and angulation at the fracture site. Pelvic ring is intact. Chronic degenerative changes of the hip joints are seen as well as the lumbar spine. Aortic and iliac calcifications are seen. IMPRESSION: Left femoral neck fracture with impaction and angulation at the fracture site. Electronically Signed   By: Inez Catalina M.D.   On: 05/27/2019 15:30    Review of Systems Blood pressure (!) 152/69, pulse 87, temperature 98.1 F (36.7 C), temperature source Oral, resp. rate 19, height 5\' 9"  (1.753 m), weight 66.7 kg, SpO2 99 %. Physical Exam Left lower extremity is shortened and externally rotated.  He has intact dorsalis pedis pulse and is able flex extend the toes.  Sensation is intact.  Skin is intact around the left hip without ecchymosis. Radiographic evaluation shows the femoral neck fracture which is displaced and not amenable to pinning.  Will require have a hemiarthroplasty to ambulate again. Assessment/Plan: Left femoral neck fracture displaced Plan is for left hip hemiarthroplasty tomorrow.  Discussed with patient today and hope to be able discussed with his  family tomorrow prior to surgery  Hessie Knows 05/27/2019, 5:21 PM

## 2019-05-27 NOTE — ED Triage Notes (Signed)
Pt comes via [facility] from home after a fall. Pt states he lost his balance and fell. Pt states pain to left hip and knee.  EMS reports pt fell around 0600 and family got pt up and put back to bed. Pt normally able to walk and get around without assistance.  Pt arrives with obvious rotation to left leg.

## 2019-05-27 NOTE — Progress Notes (Signed)
PT Cancellation Note  Patient Details Name: Gerald [name] MRN: [id number] DOB: 1920-09-19   Cancelled Treatment:    Reason Eval/Treat Not Completed: Other (comment)(Pt scheduled for hip surgery tomorrow. PT orders to be completed due to pt change in status pending surgery, orthopedic clearance for physical therapy and weight bearing orders.)   Lieutenant Diego PT, DPT 4:28 PM,05/27/19

## 2019-05-27 NOTE — ED Provider Notes (Signed)
[facility] Emergency Department Provider Note   ____________________________________________   First MD Initiated Contact with Patient 05/27/19 1522     (approximate)  I have reviewed the triage vital signs and the nursing notes.   HISTORY  Chief Complaint Fall    HPI Gerald [name] is a 86 y.o. male here for evaluation of pain in his left hip  Patient reports last night he fell while using the bathroom.  Family assisted back to bed.  He got up this morning is unable to walk due to pain left hip reports been hurting a lot since that time.  He did strike his head during the fall but did not pass out, denies any headache.  No numbness tingling or weakness.  Reports however severe pain in the left hip area with any attempt to move.  Also reports some pain in the left buttock area  No recent illness.  No fevers or chills.  No known Covid exposure.  Pain 10 out of 10 with movement the left hip, improved with laying still but currently in severe pain   Past Medical History:  Diagnosis Date  . Anemia   . Atrial fibrillation ([place])   . Cancer [facility])    Testicular  . Hemochromatosis 10/25/2014  . Hx of basal cell carcinoma 2008   R. ant. zygomatic/sideburn 2008, L preauricular 2019  . Hypertension   . Prostate cancer [facility])     Patient Active Problem List   Diagnosis Date Noted  . Left displaced femoral neck fracture ([place]) 05/27/2019  . MDS (myelodysplastic syndrome), low grade ([place]) 10/18/2016  . Anemia of chronic disease 10/21/2015  . Hemochromatosis 10/25/2014  . BP (high blood pressure) 10/21/2014  . CA of prostate ([place]) 10/21/2014  . Cancer of testicle ([place]) 10/21/2014  . Anemia 08/24/2014  . Dizziness 09/14/2013  . Difficulty in walking 09/14/2013  . 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 A-fib ([place]) 06/24/2013  . MI (mitral incompetence) 06/24/2013  . Absolute anemia 06/23/2013  . Arthritis, degenerative 06/23/2013  . Essential  (primary) hypertension 06/23/2013    Past Surgical History:  Procedure Laterality Date  . APPENDECTOMY    . CHOLECYSTECTOMY    . HAND SURGERY Bilateral   . INNER EAR SURGERY    . KYPHOPLASTY N/A 12/14/2015   Procedure: KYPHOPLASTY  L2;  Surgeon: Hessie Knows, MD;  Location: [facility] ORS;  Service: Orthopedics;  Laterality: N/A;  . PROSTATE SURGERY    . SURGERY SCROTAL / TESTICULAR Right     Prior to Admission medications   Medication Sig Start Date End Date Taking? Authorizing Provider  aspirin EC 81 MG tablet Take 81 mg by mouth once.    Yes [provider]  CALCIUM-MAGNESUIUM-ZINC 333-133-8.3 MG TABS Take 1 tablet by mouth daily.   Yes [provider]  docusate sodium (COLACE) 100 MG capsule Take 100 mg by mouth at bedtime.   Yes [provider]  lisinopril-hydrochlorothiazide (PRINZIDE,ZESTORETIC) 10-12.5 MG tablet Take 1 tablet by mouth daily.  09/19/15  Yes [provider]  Potassium 99 MG TABS Take 1 tablet by mouth daily.    Yes [provider]  Turmeric 500 MG TABS Take 500 mg by mouth at bedtime.   Yes [provider]  vitamin B-12 (CYANOCOBALAMIN) 1000 MCG tablet Take 1,000 mcg by mouth daily.   Yes [provider]  Vitamin D, Cholecalciferol, 1000 units CAPS Take 1,000 Units by mouth daily.   Yes [provider]    Allergies Levofloxacin  Family History  Problem Relation Age of Onset  . Hypertension Mother     Social History Social History   Tobacco Use  . Smoking status: Former Smoker    Types: Cigarettes    Quit date: 03/22/1958    Years since quitting: 61.2  . Smokeless tobacco: Never Used  Substance Use Topics  . Alcohol use: No  . Drug use: No    Review of Systems Constitutional: No fever/chills Eyes: No visual changes. ENT: No sore throat.  Denies neck pain Cardiovascular: Denies chest pain. Respiratory: Denies shortness of breath. Gastrointestinal: No abdominal pain.     Genitourinary: Negative for dysuria. Musculoskeletal: Negative for back pain.  See HPI Skin: Negative for rash. Neurological: Negative for headaches, areas of focal weakness or numbness.    ____________________________________________   PHYSICAL EXAM:  VITAL SIGNS: ED Triage Vitals  Enc Vitals Group     BP 05/27/19 1537 (!) 149/71     Pulse Rate 05/27/19 1537 97     Resp 05/27/19 1537 19     Temp 05/27/19 1537 98.1 F (36.7 C)     Temp Source 05/27/19 1537 Oral     SpO2 05/27/19 1537 98 %     Weight 05/27/19 1428 147 lb (66.7 kg)     Height 05/27/19 1428 5\' 9"  (1.753 m)     Head Circumference --      Peak Flow --      Pain Score 05/27/19 1428 8     Pain Loc --      Pain Edu? --      Excl. in [place]? --     Constitutional: Alert and oriented.  Overall well-appearing except in painful distress.  Wincing in pain.  Appears in pain, any attempt to move.  Severe pain Eyes: Conjunctivae are normal. Head: Atraumatic. Nose: No congestion/rhinnorhea. Mouth/Throat: Mucous membranes are moist. Neck: No stridor.  Cardiovascular: Normal rate, regular rhythm. Grossly normal heart sounds.  Good peripheral circulation. Respiratory: Normal respiratory effort.  No retractions. Lungs CTAB. Gastrointestinal: Soft and nontender. No distention. Musculoskeletal: No lower extremity tenderness nor edema.  Lower Extremities  No edema. Normal DP/PT pulses bilateral with good cap refill.  Normal neuro-motor function lower extremities bilateral.  RIGHT Right lower extremity demonstrates normal strength, good use of all muscles. No edema bruising or contusions of the right hip, right knee, right ankle. Full range of motion of the right lower extremity without pain. No pain on axial loading. No evidence of trauma.  LEFT Left lower extremity demonstrates normal strength in the distal foot toes ankle. No edema bruising or contusions of the knee, ankle.  No hematoma overlying the thigh or hip.  No  bruising noted.  Severe limitation of range of motion due to pain focally at the left hip  Neurologic:  Normal speech and language. No gross focal neurologic deficits are appreciated.  Skin:  Skin is warm, dry and intact. No rash noted. Psychiatric: Mood and affect are normal. Speech and behavior are normal.  ____________________________________________   LABS (all labs ordered are listed, but only abnormal results are displayed)  Labs Reviewed  CBC - Abnormal; Notable for the following components:      Result Value   WBC 12.6 (*)    RBC 2.41 (*)    Hemoglobin 7.9 (*)    HCT 23.6 (*)    RDW 22.7 (*)    Platelets 458 (*)    nRBC 0.5 (*)    All other components within normal limits  BASIC METABOLIC PANEL - Abnormal; Notable for the following components:   Glucose, Bld 129 (*)    BUN 29 (*)    All other components within normal limits  PROTIME-INR - Abnormal; Notable for the following components:   Prothrombin Time 16.4 (*)    INR 1.3 (*)    All other components within normal limits  SARS CORONAVIRUS 2 (TAT 6-24 HRS)  URINALYSIS, ROUTINE W REFLEX MICROSCOPIC  BASIC METABOLIC PANEL  CBC  SAMPLE TO BLOOD BANK  TYPE AND SCREEN   ____________________________________________  EKG  Reviewed inter by me at 1700 Heart rate 99 QRS 99 QTc 06/20[birth year]1948 Sinus rhythm, probable left ventricular hypertrophy with repolarization abnormality.  Prolonged QT ____________________________________________  RADIOLOGY  DG Chest 1 View  Result Date: 05/27/2019 CLINICAL DATA:  Recent fall with known left hip fracture, initial encounter EXAM: CHEST  1 VIEW COMPARISON:  05/05/2013 FINDINGS: Cardiac shadow is enlarged. Tortuous thoracic aorta with atherosclerotic calcifications is again noted. Mild interstitial changes are seen which may be related to a mild degree of CHF. No focal infiltrate or effusion is seen. No bony abnormality is noted. IMPRESSION: Mild increased interstitial changes which may be  related to some vascular congestion and mild CHF. Electronically Signed   By: Inez Catalina M.D.   On: 05/27/2019 15:35   DG Hip Unilat W or Wo Pelvis 2-3 Views Left  Result Date: 05/27/2019 CLINICAL DATA:  Recent fall with hip pain, initial encounter EXAM: DG HIP (WITH OR WITHOUT PELVIS) 2-3V LEFT COMPARISON:  None. FINDINGS: Subcapital left femoral neck fracture is noted with impaction and angulation at the fracture site. Pelvic ring is intact. Chronic degenerative changes of the hip joints are seen as well as the lumbar spine. Aortic and iliac calcifications are seen. IMPRESSION: Left femoral neck fracture with impaction and angulation at the fracture site. Electronically Signed   By: Inez Catalina M.D.   On: 05/27/2019 15:30    Imaging studies reviewed and personally viewed by me.  Concerning for right hip fracture. ____________________________________________   PROCEDURES  Procedure(s) performed: None  Procedures  Critical Care performed: No  ____________________________________________   INITIAL IMPRESSION / ASSESSMENT AND PLAN / ED COURSE  Pertinent labs & imaging results that were available during my care of the patient were reviewed by me and considered in my medical decision making (see chart for details).   Evaluation consistent with left hip fracture.  Patient did strike head but not on any anticoagulant, denies headache, neurologic changes, nausea vomiting or symptoms of significant head injury.  Ground-level fall.  No neck pain.  No midline cervical tenderness.    Clinical Course as of May 27 2006  Thu May 27, 2019  1543 Dr. Rudene Christians discussed, will see in consult and likely consider repair tomorrow.   [MQ]  [id number] Admit discussed with Dr. Jimmye Norman of hospitalists   [MQ]    Clinical Course User Index [MQ] Delman Kitten, MD   Await lab testing, called in the hospitalist for anticipated admission.  Orthopedics consulting.  Gerald [name] was evaluated in Emergency  Department on 05/27/2019 for the symptoms described in the history of present illness. He was evaluated in the context of the global COVID-19 pandemic, which necessitated consideration that the patient might be at risk for infection with the SARS-CoV-2 virus that causes COVID-19. Institutional protocols and algorithms that pertain to the evaluation of patients at risk for COVID-19 are in a state of rapid change based on information released by regulatory bodies including the  CDC and federal and [employer]. These policies and algorithms were followed during the patient's care in the ED.  Patient denies Covid exposure.  No Covid-like symptoms detected  ____________________________________________   FINAL CLINICAL IMPRESSION(S) / ED DIAGNOSES  Final diagnoses:  Closed left hip fracture, initial encounter ([place])  Nonspecific abnormal electrocardiogram (ECG) (EKG)  Prolonged Q-T interval on ECG        Note:  This document was prepared using Dragon voice recognition software and may include unintentional dictation errors       Delman Kitten, MD 05/27/19 2008

## 2019-05-27 NOTE — ED Notes (Signed)
Pt to xray at this time.

## 2019-05-27 NOTE — ED Notes (Signed)
Chaplain Cora at bedside at this time.

## 2019-05-27 NOTE — ED Notes (Signed)
RN Carney Harder informed of pt status and being roomed.

## 2019-05-27 NOTE — Progress Notes (Addendum)
   05/27/19 1429  Clinical Encounter Type  Visited With Patient;Health care provider  Visit Type Initial  Referral From Family  Consult/Referral To Chaplain  Advance Directives (For Healthcare)  Does Patient Have a Medical Advance Directive? No  Mental Health Advance Directives  Does Patient Have a Mental Health Advance Directive? No  Chaplain received call from daughter, [place] in reference to a [place] coming and offering last rites to her father. Daughter said nurse in ED told her that was not possible. Chaplain spoke with charge nurse, Amy and asked for time Idelle Crouch would come. Chaplain called Father Darnelle Maffucci at [phone] and he said he would get here ard 11. (Patient is not on comfort care, the family wants patient prayed for and given opportunity to make confession.)  While Chaplain was getting information patient was moved to room. Chaplain spoke with head nurse on 1-A and she was already aware of a Idelle Crouch coming. I called daughter and told her what had been arranged and she was concerned about Father Celesta Gentile getting here after her father was taken to surgery and patient not have a chance to make confession. Chaplain spoke with patient's nurse, Judson Roch and she said that patient's surgery is scheduled for 1 pm Friday. Nurse Clarise Cruz said if Father Celesta Gentile could get here by 10:30 that would be good. Chaplain called Father Celesta Gentile and told him the time of surgery and he said he would get here by 10. Chaplain Waters will meet Father Seabo in the lobby and accompany him to patient's room. Chaplain called daughter and told her Father Celesta Gentile will come earlier. Chaplain also told daughter that after patient's COVID results are back that he can have one designated visitor. Chaplain also explained that the family can call and speak with their father's nurse at anytime. Daughter sounded more at ease and was grateful for information. May Hill's telephone numbers are [phone] & [phone].

## 2019-05-27 NOTE — H&P (Signed)
History and Physical    Gerald [name] [id number] DOB: 05-Jul-1920 DOA: 05/27/2019  PCP: [name], MD   Patient coming from: Home, lives with daughter and family, ambulates with walker  I have personally briefly reviewed patient's old medical records in [place]  Chief Complaint: left hip pain  HPI: Gerald [name] is a 86 y.o. male with medical history significant of chronic anemia secondary to CKD and myelodysplastic syndrome followed by hematology, A. fib, hypertension, prostate cancer, testicular cancer, hemochromatosis presented to the ED today with left hip pain after mechanical fall at home.  He reports associated low back pain.  Does report that he bumped his head during the fall but did not lose consciousness, denies headache.  He is not on blood thinners.  Patient denies any recent illnesses or sick contacts, fevers or chills, chest pain, shortness of breath, abdominal pain, nausea vomiting or diarrhea dysuria or urinary frequency or other recent symptoms.  Spoke with patient's daughter, she reported patient is mentally sharp at baseline, but she noticed some mild confusion yesterday evening before the fall occurred.  She reports he is otherwise been in his usual state of health recently.   ED Course: Vitals within normal limits.  CBC showed mild leukocytosis of 12.6, anemia with hemoglobin 7.9 (baseline appears to be between 8 and 9 recently), thrombocytosis with platelets 458.  BMP was notable only for BUN elevated 29.  Renal function electrolytes within normal limits.  Chest x-ray showed mildly increased interstitial markings.  Left hip x-ray showed fracture of the femoral neck.  Patient's orthopedist Dr. Rudene Christians was contacted by ED physician, recommended admission to hospitalist service and plan for surgery in the morning.  Review of Systems: As per HPI otherwise 10 point review of systems negative.    Past Medical History:  Diagnosis Date  . Anemia   .  Atrial fibrillation ([place])   . Cancer [facility])    Testicular  . Hemochromatosis 10/25/2014  . Hx of basal cell carcinoma 2008   R. ant. zygomatic/sideburn 2008, L preauricular 2019  . Hypertension   . Prostate cancer [facility])     Past Surgical History:  Procedure Laterality Date  . APPENDECTOMY    . CHOLECYSTECTOMY    . HAND SURGERY Bilateral   . INNER EAR SURGERY    . KYPHOPLASTY N/A 12/14/2015   Procedure: KYPHOPLASTY  L2;  Surgeon: Hessie Knows, MD;  Location: [facility] ORS;  Service: Orthopedics;  Laterality: N/A;  . PROSTATE SURGERY    . SURGERY SCROTAL / TESTICULAR Right      reports that he quit smoking about 61 years ago. His smoking use included cigarettes. He has never used smokeless tobacco. He reports that he does not drink alcohol or use drugs.  Allergies  Allergen Reactions  . Levofloxacin Other (See Comments)    Taste alterations for about 1 month after taking med    Family History  Problem Relation Age of Onset  . Hypertension Mother      Prior to Admission medications   Medication Sig Start Date End Date Taking? Authorizing Provider  aspirin EC 81 MG tablet Take 81 mg by mouth once.     [provider]  b complex vitamins capsule Take 1 capsule by mouth daily.     [provider]  CALCIUM-MAGNESIUM-ZINC PO Take 1 capsule by mouth daily.     [provider]  Docusate Calcium (STOOL SOFTENER PO) Take 1 capsule by mouth 2 (two) times daily.  [provider]  lisinopril-hydrochlorothiazide (PRINZIDE,ZESTORETIC) 10-12.5 MG tablet Take 1 tablet by mouth daily.  09/19/15   [provider]  Multiple Vitamins-Minerals (CENTRUM SILVER ADULT 50+) TABS Take by mouth.    [provider]  Potassium 99 MG TABS Take 1 tablet by mouth daily.     [provider]  potassium gluconate (RA POTASSIUM GLUCONATE) 595 (99 K) MG TABS tablet Take by mouth.    [provider]  Vitamin D, Cholecalciferol, 1000 units CAPS Take  1,000 Units by mouth daily.    [provider]    Physical Exam: Vitals:   05/27/19 1428 05/27/19 1537  BP:  (!) 149/71  Pulse:  97  Resp:  19  Temp:  98.1 F (36.7 C)  TempSrc:  Oral  SpO2:  98%  Weight: 66.7 kg   Height: 5\' 9"  (1.753 m)      Constitutional: NAD, calm, appears in pain (wincing) Eyes: EOMI, lids and conjunctivae normal ENMT: Mucous membranes are dry. Normal dentition.  Hearing grossly normal. Respiratory: CTAB, no wheezing, no crackles. Normal respiratory effort. No accessory muscle use.  Cardiovascular: RRR, no murmurs / rubs / gallops. No extremity edema. 2+ pedal pulses.   Abdomen: soft, NT, ND, no masses or HSM palpated. +Bowel sounds.  Musculoskeletal: no clubbing / cyanosis. Normal muscle tone.  Left lower extremity shortened and externally rotated. Skin: dry, intact, normal color, normal temperature Neurologic: CN 2-12 grossly intact. Normal speech.  Grossly non-focal exam. Psychiatric: Alert and oriented x 3. Normal mood. Congruent affect.  Normal judgement and insight.   Labs on Admission: I have personally reviewed following labs and imaging studies  CBC: Recent Labs  Lab 05/24/19 1417 05/27/19 1430  WBC  --  12.6*  HGB 8.5* 7.9*  HCT 25.8* 23.6*  MCV  --  97.9  PLT  --  [id number]*   Basic Metabolic Panel: Recent Labs  Lab 05/27/19 1430  NA 143  K 3.5  CL 110  CO2 24  GLUCOSE 129*  BUN 29*  CREATININE 0.98  CALCIUM 8.9   GFR: Estimated Creatinine Clearance: 41.6 mL/min (by C-G formula based on SCr of 0.98 mg/dL). Liver Function Tests: No results for input(s): AST, ALT, ALKPHOS, BILITOT, PROT, ALBUMIN in the last 168 hours. No results for input(s): LIPASE, AMYLASE in the last 168 hours. No results for input(s): AMMONIA in the last 168 hours. Coagulation Profile: Recent Labs  Lab 05/27/19 1430  INR 1.3*   Cardiac Enzymes: No results for input(s): CKTOTAL, CKMB, CKMBINDEX, TROPONINI in the last 168 hours. BNP (last 3  results) No results for input(s): PROBNP in the last 8760 hours. HbA1C: No results for input(s): HGBA1C in the last 72 hours. CBG: No results for input(s): GLUCAP in the last 168 hours. Lipid Profile: No results for input(s): CHOL, HDL, LDLCALC, TRIG, CHOLHDL, LDLDIRECT in the last 72 hours. Thyroid Function Tests: No results for input(s): TSH, T4TOTAL, FREET4, T3FREE, THYROIDAB in the last 72 hours. Anemia Panel: No results for input(s): VITAMINB12, FOLATE, FERRITIN, TIBC, IRON, RETICCTPCT in the last 72 hours. Urine analysis:    Component Value Date/Time   COLORURINE Yellow 05/05/2013 1500   APPEARANCEUR Clear 05/05/2013 1500   LABSPEC 1.014 05/05/2013 1500   PHURINE 5.0 05/05/2013 1500   GLUCOSEU Negative 05/05/2013 1500   HGBUR Negative 05/05/2013 1500   BILIRUBINUR Negative 05/05/2013 1500   KETONESUR Negative 05/05/2013 1500   PROTEINUR Negative 05/05/2013 1500   NITRITE Negative 05/05/2013 1500   LEUKOCYTESUR Negative 05/05/2013 1500  Radiological Exams on Admission: DG Chest 1 View  Result Date: 05/27/2019 CLINICAL DATA:  Recent fall with known left hip fracture, initial encounter EXAM: CHEST  1 VIEW COMPARISON:  05/05/2013 FINDINGS: Cardiac shadow is enlarged. Tortuous thoracic aorta with atherosclerotic calcifications is again noted. Mild interstitial changes are seen which may be related to a mild degree of CHF. No focal infiltrate or effusion is seen. No bony abnormality is noted. IMPRESSION: Mild increased interstitial changes which may be related to some vascular congestion and mild CHF. Electronically Signed   By: Inez Catalina M.D.   On: 05/27/2019 15:35   DG Hip Unilat W or Wo Pelvis 2-3 Views Left  Result Date: 05/27/2019 CLINICAL DATA:  Recent fall with hip pain, initial encounter EXAM: DG HIP (WITH OR WITHOUT PELVIS) 2-3V LEFT COMPARISON:  None. FINDINGS: Subcapital left femoral neck fracture is noted with impaction and angulation at the fracture site. Pelvic  ring is intact. Chronic degenerative changes of the hip joints are seen as well as the lumbar spine. Aortic and iliac calcifications are seen. IMPRESSION: Left femoral neck fracture with impaction and angulation at the fracture site. Electronically Signed   By: Inez Catalina M.D.   On: 05/27/2019 15:30     Assessment/Plan Principal Problem:   Left displaced femoral neck fracture (HCC) Active Problems:   A-fib (HCC)   Essential (primary) hypertension   Anemia of chronic disease   Hemochromatosis   MDS (myelodysplastic syndrome), low grade (HCC)   Left femoral neck fracture - secondary to mechanical fall --ortho consulted, Dr. Rudene Christians --plan for surgery in AM --NPO after midnight --pain control with scheduled Tylenol, PRN tramadol or oxycodone, IV morphine for breakthrough pain --bowel regimen --PT evaluation post-op --NOTE: patient's daughter specifically requested that epidural be used for surgery instead of general anesthesia.  Family feel general anesthesia too high risk with his age, and they knew an elderly person who passed away after surgery for hip fracture.    Paroxysmal A-fib (HCC) - rate controlled.  Not on anticoagulation or rate control meds. Sinus rhythm with ectopy on ECG. --telemetry monitoring  QTc Prolongation - QTc 573 on ECG at time of admission --telemetry monitoring --avoid QT prolonging agents as much as possible --maintain K>4 and Mg>2 --serial ECG's to monitor  Essential (primary) hypertension - chronic, stable.  Takes lisinopril/HCTZ at home. --hold lisinopril/HCTZ for now --PRN oral hydralazine or IV labetalol  Anemia of chronic disease - no evidence of active bleeding.  Hbg 7.9 on admission. --type and cross --monitor CBC --transfuse if Hbg < 7  Hemochromatosis MDS (myelodysplastic syndrome), low grade (HCC) --follows with heme/onc for both, and anemia --monitor CBC and renal function   DVT prophylaxis: SCD's  Code Status: Full -confirmed in  discussion with patient and daughter by phone  Family Communication: Spoke with patient's daughter, Gerald [name] at time of admission updated on the plan and answered questions.  She did confirm CODE STATUS and stated she would discuss with her other siblings.  Daughter requests that, if possible, general anesthesia be avoided for surgery due to the high risk with his advanced age.  She request that epidural be used instead.  Disposition Plan: pending PT evaluation after surgery, expect will need SNF for rehab versus home health PT Consults called: orthopedics, Dr. Rudene Christians contacted by ED physician  Admission status: inpatient  Severity of Illness: The appropriate patient status for this patient is INPATIENT. It is not anticipated that the patient will be medically stable for discharge from the  hospital within 2 midnights of admission.  The following factors support the patient status of inpatient: --Presenting symptoms include left hip pain, inability to ambulate. --Worrisome physical exam findings include left hip tenderness and no tolerance for range of motion. --Initial radiographic and laboratory data are worrisome because of left femoral neck fracture. --Chronic co-morbidities include A-fib, HTN, MDS, hemochromatosis, anemia, CKD, prostate and testicular cancer. Further evaluation and management in hospital, as outlined above, is warranted because patient will require orthopedic surgery to repair left hip fracture and is currently unable to ambulate.     Ezekiel Slocumb, DO [facility]   If 7PM-7AM, please contact night-coverage [email or web address]  05/27/2019, 4:15 PM

## 2019-05-28 ENCOUNTER — Encounter
Admission: EM | Disposition: A | Discharge status: Home Health | Payer: Self-pay | Source: Home / Self Care | Attending: Internal Medicine

## 2019-05-28 ENCOUNTER — Inpatient Hospital Stay: Payer: Medicare Other

## 2019-05-28 ENCOUNTER — Inpatient Hospital Stay: Payer: Medicare Other | Admitting: Anesthesiology

## 2019-05-28 ENCOUNTER — Encounter: Payer: Self-pay | Admitting: Internal Medicine

## 2019-05-28 DIAGNOSIS — D72829 Elevated white blood cell count, unspecified: Secondary | ICD-10-CM | POA: Diagnosis present

## 2019-05-28 HISTORY — PX: ANTERIOR APPROACH HEMI HIP ARTHROPLASTY: SHX6690

## 2019-05-28 LAB — CBC
HCT: 23 % — ABNORMAL LOW (ref 39.0–52.0)
Hemoglobin: 7.8 g/dL — ABNORMAL LOW (ref 13.0–17.0)
MCH: 32.9 pg (ref 26.0–34.0)
MCHC: 33.9 g/dL (ref 30.0–36.0)
MCV: 97 fL (ref 80.0–100.0)
Platelets: 354 10*3/uL (ref 150–400)
RBC: 2.37 MIL/uL — ABNORMAL LOW (ref 4.22–5.81)
RDW: 23.6 % — ABNORMAL HIGH (ref 11.5–15.5)
WBC: 11.4 10*3/uL — ABNORMAL HIGH (ref 4.0–10.5)
nRBC: 0.4 % — ABNORMAL HIGH (ref 0.0–0.2)

## 2019-05-28 LAB — URINALYSIS, ROUTINE W REFLEX MICROSCOPIC
Bacteria, UA: NONE SEEN
Bilirubin Urine: NEGATIVE
Glucose, UA: NEGATIVE mg/dL
Hgb urine dipstick: NEGATIVE
Ketones, ur: 5 mg/dL — AB
Leukocytes,Ua: NEGATIVE
Nitrite: NEGATIVE
Protein, ur: 30 mg/dL — AB
Specific Gravity, Urine: 1.027 (ref 1.005–1.030)
pH: 6 (ref 5.0–8.0)

## 2019-05-28 LAB — CREATININE, SERUM
Creatinine, Ser: 0.91 mg/dL (ref 0.61–1.24)
GFR calc Af Amer: >60 mL/min (ref >60)
GFR calc non Af Amer: >60 mL/min (ref >60)

## 2019-05-28 LAB — BASIC METABOLIC PANEL
Anion gap: 8 (ref 5–15)
BUN: 27 mg/dL — ABNORMAL HIGH (ref 8–23)
CO2: 23 mmol/L (ref 22–32)
Calcium: 8.7 mg/dL — ABNORMAL LOW (ref 8.9–10.3)
Chloride: 111 mmol/L (ref 98–111)
Creatinine, Ser: 0.9 mg/dL (ref 0.61–1.24)
GFR calc Af Amer: >60 mL/min (ref >60)
GFR calc non Af Amer: >60 mL/min (ref >60)
Glucose, Bld: 137 mg/dL — ABNORMAL HIGH (ref 70–99)
Potassium: 3.4 mmol/L — ABNORMAL LOW (ref 3.5–5.1)
Sodium: 142 mmol/L (ref 135–145)

## 2019-05-28 LAB — SARS CORONAVIRUS 2 (TAT 6-24 HRS): SARS Coronavirus 2: NEGATIVE

## 2019-05-28 LAB — ABO/RH: ABO/RH(D): O POS

## 2019-05-28 LAB — SURGICAL PCR SCREEN
MRSA, PCR: NEGATIVE
Staphylococcus aureus: NEGATIVE

## 2019-05-28 LAB — HEMOGLOBIN AND HEMATOCRIT, BLOOD
HCT: 28.7 % — ABNORMAL LOW (ref 39.0–52.0)
Hemoglobin: 9.7 g/dL — ABNORMAL LOW (ref 13.0–17.0)

## 2019-05-28 LAB — PREPARE RBC (CROSSMATCH)

## 2019-05-28 SURGERY — HEMIARTHROPLASTY, HIP, DIRECT ANTERIOR APPROACH, FOR FRACTURE
Anesthesia: Spinal | Laterality: Left

## 2019-05-28 MED ORDER — PROPOFOL 500 MG/50ML IV EMUL
INTRAVENOUS | Status: DC | PRN
  Administered 2019-05-28: 25 ug/kg/min via INTRAVENOUS

## 2019-05-28 MED ORDER — KETAMINE HCL 50 MG/ML IJ SOLN
INTRAMUSCULAR | Status: DC | PRN
  Administered 2019-05-28 (×2): 25 mg via INTRAMUSCULAR

## 2019-05-28 MED ORDER — BUPIVACAINE HCL (PF) 0.5 % IJ SOLN
INTRAMUSCULAR | Status: DC | PRN
  Administered 2019-05-28: 2.8 mL

## 2019-05-28 MED ORDER — CEFAZOLIN SODIUM-DEXTROSE 1-4 GM/50ML-% IV SOLN
INTRAVENOUS | Status: AC
  Administered 2019-05-28: 1 g via INTRAVENOUS
  Filled 2019-05-28: qty 50

## 2019-05-28 MED ORDER — POTASSIUM CHLORIDE CRYS ER 20 MEQ PO TBCR
40.0000 meq | EXTENDED_RELEASE_TABLET | Freq: Once | ORAL | Status: AC
  Administered 2019-05-28: 18:00:00 40 meq via ORAL
  Filled 2019-05-28: qty 2

## 2019-05-28 MED ORDER — DOCUSATE SODIUM 100 MG PO CAPS
100.0000 mg | ORAL_CAPSULE | Freq: Two times a day (BID) | ORAL | Status: DC
  Administered 2019-05-29 – 2019-06-01 (×5): 100 mg via ORAL
  Filled 2019-05-28 (×5): qty 1

## 2019-05-28 MED ORDER — BLISTEX MEDICATED EX OINT
TOPICAL_OINTMENT | CUTANEOUS | Status: DC | PRN
  Filled 2019-05-28: qty 6.3

## 2019-05-28 MED ORDER — SODIUM CHLORIDE 0.9 % IV SOLN
INTRAVENOUS | Status: DC | PRN
  Administered 2019-05-28: 15:00:00 60 mL

## 2019-05-28 MED ORDER — METOCLOPRAMIDE HCL 10 MG PO TABS: 5.0000 mg | ORAL_TABLET | Freq: Three times a day (TID) | ORAL | Status: DC | PRN

## 2019-05-28 MED ORDER — KETAMINE HCL 50 MG/ML IJ SOLN
INTRAMUSCULAR | Status: AC
  Filled 2019-05-28: qty 10

## 2019-05-28 MED ORDER — BUPIVACAINE-EPINEPHRINE 0.25% -1:200000 IJ SOLN
INTRAMUSCULAR | Status: DC | PRN
  Administered 2019-05-28: 30 mL

## 2019-05-28 MED ORDER — PROPOFOL 500 MG/50ML IV EMUL
INTRAVENOUS | Status: AC
  Filled 2019-05-28: qty 50

## 2019-05-28 MED ORDER — PHENOL 1.4 % MT LIQD
1.0000 | OROMUCOSAL | Status: DC | PRN
  Filled 2019-05-28: qty 177

## 2019-05-28 MED ORDER — SODIUM CHLORIDE 0.9 % IV SOLN: INTRAVENOUS | Status: DC

## 2019-05-28 MED ORDER — MENTHOL 3 MG MT LOZG
1.0000 | LOZENGE | OROMUCOSAL | Status: DC | PRN
  Filled 2019-05-28: qty 9

## 2019-05-28 MED ORDER — ALUM & MAG HYDROXIDE-SIMETH 200-200-20 MG/5ML PO SUSP: 30.0000 mL | ORAL | Status: DC | PRN

## 2019-05-28 MED ORDER — METOCLOPRAMIDE HCL 5 MG/ML IJ SOLN: 5.0000 mg | Freq: Three times a day (TID) | INTRAMUSCULAR | Status: DC | PRN

## 2019-05-28 MED ORDER — PROPOFOL 10 MG/ML IV BOLUS
INTRAVENOUS | Status: DC | PRN
  Administered 2019-05-28: 20 mg via INTRAVENOUS

## 2019-05-28 MED ORDER — ENOXAPARIN SODIUM 40 MG/0.4ML ~~LOC~~ SOLN
40.0000 mg | SUBCUTANEOUS | Status: DC
  Administered 2019-05-29 – 2019-06-01 (×4): 40 mg via SUBCUTANEOUS
  Filled 2019-05-28 (×4): qty 0.4

## 2019-05-28 MED ORDER — MAGNESIUM HYDROXIDE 400 MG/5ML PO SUSP: 30.0000 mL | Freq: Every day | ORAL | Status: DC | PRN

## 2019-05-28 MED ORDER — SODIUM CHLORIDE 0.9 % IV SOLN
INTRAVENOUS | Status: DC | PRN
  Administered 2019-05-28: 15 ug/min via INTRAVENOUS

## 2019-05-28 MED ORDER — SODIUM CHLORIDE 0.9 % IV SOLN: INTRAVENOUS | Status: DC | PRN

## 2019-05-28 MED ORDER — FENTANYL CITRATE (PF) 100 MCG/2ML IJ SOLN: 25.0000 ug | INTRAMUSCULAR | Status: DC | PRN

## 2019-05-28 MED ORDER — CEFAZOLIN SODIUM-DEXTROSE 1-4 GM/50ML-% IV SOLN
1.0000 g | Freq: Four times a day (QID) | INTRAVENOUS | Status: AC
  Administered 2019-05-29: 04:00:00 1 g via INTRAVENOUS
  Filled 2019-05-28 (×3): qty 50

## 2019-05-28 MED ORDER — LACTATED RINGERS IV SOLN: INTRAVENOUS | Status: DC | PRN

## 2019-05-28 SURGICAL SUPPLY — 65 items
BLADE SAGITTAL AGGR TOOTH XLG (BLADE) ×3 IMPLANT
BNDG COHESIVE 6X5 TAN STRL LF (GAUZE/BANDAGES/DRESSINGS) ×9 IMPLANT
BOWL CEMENT MIXING ADV NOZZLE (MISCELLANEOUS) ×3 IMPLANT
CANISTER SUCT 1200ML W/VALVE (MISCELLANEOUS) ×3 IMPLANT
CANISTER WOUND CARE 500ML ATS (WOUND CARE) IMPLANT
CEMENT BONE 1-PACK (Cement) ×6 IMPLANT
CHLORAPREP W/TINT 26 (MISCELLANEOUS) ×3 IMPLANT
COVER BACK TABLE REUSABLE LG (DRAPES) ×3 IMPLANT
COVER WAND RF STERILE (DRAPES) ×3 IMPLANT
DRAPE 3/4 80X56 (DRAPES) ×9 IMPLANT
DRAPE C-ARM XRAY 36X54 (DRAPES) ×3 IMPLANT
DRAPE INCISE IOBAN 66X60 STRL (DRAPES) IMPLANT
DRAPE POUCH INSTRU U-SHP 10X18 (DRAPES) ×3 IMPLANT
DRESSING SURGICEL FIBRLLR 1X2 (HEMOSTASIS) ×2 IMPLANT
DRSG OPSITE POSTOP 4X8 (GAUZE/BANDAGES/DRESSINGS) ×3 IMPLANT
DRSG SURGICEL FIBRILLAR 1X2 (HEMOSTASIS) ×6
ELECT BLADE 6.5 EXT (BLADE) ×3 IMPLANT
ELECT CAUTERY BLADE 6.4 (BLADE) ×3 IMPLANT
ELECT REM PT RETURN 9FT ADLT (ELECTROSURGICAL) ×3
ELECTRODE REM PT RTRN 9FT ADLT (ELECTROSURGICAL) ×1 IMPLANT
GLOVE BIOGEL PI IND STRL 9 (GLOVE) ×1 IMPLANT
GLOVE BIOGEL PI INDICATOR 9 (GLOVE) ×2
GLOVE SURG SYN 9.0  PF PI (GLOVE) ×4
GLOVE SURG SYN 9.0 PF PI (GLOVE) ×2 IMPLANT
GOWN SRG 2XL LVL 4 RGLN SLV (GOWNS) ×1 IMPLANT
GOWN STRL NON-REIN 2XL LVL4 (GOWNS) ×2
GOWN STRL REUS W/ TWL LRG LVL3 (GOWN DISPOSABLE) ×1 IMPLANT
GOWN STRL REUS W/TWL LRG LVL3 (GOWN DISPOSABLE) ×2
HEAD BIPOLAR SZ50 HEAD 28MM (Head) ×3 IMPLANT
HEMOVAC 400CC 10FR (MISCELLANEOUS) IMPLANT
HIP FEM HD S 28 (Head) ×3 IMPLANT
HIP STEM FEM 5 STD 0118155 (Stem) ×3 IMPLANT
HOLDER FOLEY CATH W/STRAP (MISCELLANEOUS) IMPLANT
HOOD PEEL AWAY FLYTE STAYCOOL (MISCELLANEOUS) ×3 IMPLANT
KIT PREVENA INCISION MGT 13 (CANNISTER) IMPLANT
MAT ABSORB  FLUID 56X50 GRAY (MISCELLANEOUS) ×2
MAT ABSORB FLUID 56X50 GRAY (MISCELLANEOUS) ×1 IMPLANT
NDL SAFETY ECLIPSE 18X1.5 (NEEDLE) ×1 IMPLANT
NEEDLE HYPO 18GX1.5 SHARP (NEEDLE) ×2
NEEDLE SPNL 20GX3.5 QUINCKE YW (NEEDLE) ×6 IMPLANT
NOZZLE FLEX THIN (MISCELLANEOUS) ×3 IMPLANT
NS IRRIG 1000ML POUR BTL (IV SOLUTION) ×3 IMPLANT
PACK HIP COMPR (MISCELLANEOUS) ×3 IMPLANT
PENCIL SMOKE EVACUATOR COATED (MISCELLANEOUS) ×3 IMPLANT
PRESSURIZER FEM CANAL M (MISCELLANEOUS) ×3 IMPLANT
RESTRICTOR CEMENT SZ 5 C-STEM (Cement) ×3 IMPLANT
SCALPEL PROTECTED #10 DISP (BLADE) ×6 IMPLANT
SOL PREP PVP 2OZ (MISCELLANEOUS)
SOLUTION PREP PVP 2OZ (MISCELLANEOUS) IMPLANT
SPONGE DRAIN TRACH 4X4 STRL 2S (GAUZE/BANDAGES/DRESSINGS) ×3 IMPLANT
STAPLER SKIN PROX 35W (STAPLE) ×6 IMPLANT
STRAP SAFETY 5IN WIDE (MISCELLANEOUS) ×3 IMPLANT
SUT DVC 2 QUILL PDO  T11 36X36 (SUTURE) ×2
SUT DVC 2 QUILL PDO T11 36X36 (SUTURE) ×1 IMPLANT
SUT SILK 0 (SUTURE) ×2
SUT SILK 0 30XBRD TIE 6 (SUTURE) ×1 IMPLANT
SUT V-LOC 90 ABS DVC 3-0 CL (SUTURE) ×3 IMPLANT
SUT VIC AB 1 CT1 36 (SUTURE) ×3 IMPLANT
SYR 20ML LL LF (SYRINGE) ×3 IMPLANT
SYR 30ML LL (SYRINGE) ×3 IMPLANT
SYR 50ML LL SCALE MARK (SYRINGE) ×6 IMPLANT
SYR BULB IRRIG 60ML STRL (SYRINGE) ×3 IMPLANT
TAPE MICROFOAM 4IN (TAPE) IMPLANT
TOWEL OR 17X26 4PK STRL BLUE (TOWEL DISPOSABLE) ×3 IMPLANT
TRAY FOLEY MTR SLVR 16FR STAT (SET/KITS/TRAYS/PACK) IMPLANT

## 2019-05-28 NOTE — NC FL2 (Signed)
[place] LEVEL OF CARE SCREENING TOOL     IDENTIFICATION  Patient Name: Gerald [name] Birthdate: [id number] Sex: male Admission Date (Current Location): 05/27/2019  [place] and [place] Number:  Engineering geologist and Address:  [facility], [address] [place], [place] [address]      Provider Number: [id number]  Attending Physician Name and Address:  Ezekiel Slocumb, DO  Relative Name and Phone Number:  Lilian-daughter [phone]    Current Level of Care: Hospital Recommended Level of Care: [place] Prior Approval Number:    Date Approved/Denied:   PASRR Number: [id number]  Discharge Plan: SNF    Current Diagnoses: Patient Active Problem List   Diagnosis Date Noted  . Left displaced femoral neck fracture ([place]) 05/27/2019  . MDS (myelodysplastic syndrome), low grade ([place]) 10/18/2016  . Anemia of chronic disease 10/21/2015  . Hemochromatosis 10/25/2014  . BP (high blood pressure) 10/21/2014  . CA of prostate ([place]) 10/21/2014  . Cancer of testicle ([place]) 10/21/2014  . Anemia 08/24/2014  . Dizziness 09/14/2013  . Difficulty in walking 09/14/2013  . 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 A-fib ([place]) 06/24/2013  . MI (mitral incompetence) 06/24/2013  . Absolute anemia 06/23/2013  . Arthritis, degenerative 06/23/2013  . Essential (primary) hypertension 06/23/2013    Orientation RESPIRATION BLADDER Height & Weight     Self, Time, Situation, Place  Normal Continent Weight: 147 lb (66.7 kg) Height:  5\' 9"  (175.3 cm)  BEHAVIORAL SYMPTOMS/MOOD NEUROLOGICAL BOWEL NUTRITION STATUS      Continent Diet(heart healthy thin liquids)  AMBULATORY STATUS COMMUNICATION OF NEEDS Skin   Limited Assist Verbally Surgical wounds                       Personal Care Assistance Level of Assistance  Bathing, Dressing Bathing Assistance: Limited assistance   Dressing Assistance: Limited assistance      Functional Limitations Info             SPECIAL CARE FACTORS FREQUENCY  PT (By licensed PT), OT (By licensed OT)     PT Frequency: 5x week OT Frequency: 5x week            Contractures Contractures Info: Not present    Additional Factors Info  Code Status, Allergies Code Status Info: Partial Code Allergies Info: levofloxacin           Current Medications (05/28/2019):  This is the current hospital active medication list Current Facility-Administered Medications  Medication Dose Route Frequency Provider Last Rate Last Admin  . 0.9 %  sodium chloride infusion   Intravenous Continuous Nicole Kindred A, DO 75 mL/hr at 05/28/19 0720 New Bag at 05/28/19 0720  . acetaminophen (TYLENOL) tablet 650 mg  650 mg Oral Q6H PRN Nicole Kindred A, DO   650 mg at 05/27/19 1739   Or  . acetaminophen (TYLENOL) suppository 650 mg  650 mg Rectal Q6H PRN Nicole Kindred A, DO      . acetaminophen (TYLENOL) tablet 650 mg  650 mg Oral Q6H Nicole Kindred A, DO   650 mg at 05/27/19 2329  . bisacodyl (DULCOLAX) EC tablet 5 mg  5 mg Oral Daily PRN Nicole Kindred A, DO      . ceFAZolin (ANCEF) IVPB 1 g/50 mL premix  1 g Intravenous Once Hessie Knows, MD      . docusate sodium (COLACE) capsule 100 mg  100 mg Oral BID Nicole Kindred A, DO   100  mg at 05/27/19 2228  . hydrALAZINE (APRESOLINE) tablet 25 mg  25 mg Oral Q6H PRN Nicole Kindred A, DO      . labetalol (NORMODYNE) injection 10 mg  10 mg Intravenous Q3H PRN Nicole Kindred A, DO      . lip balm (BLISTEX) ointment   Topical PRN Nicole Kindred A, DO      . magnesium citrate solution 1 Bottle  1 Bottle Oral Once PRN Nicole Kindred A, DO      . morphine 2 MG/ML injection 2 mg  2 mg Intravenous Q4H PRN Nicole Kindred A, DO   2 mg at 05/27/19 1637  . ondansetron (ZOFRAN) tablet 4 mg  4 mg Oral Q6H PRN Nicole Kindred A, DO       Or  . ondansetron (ZOFRAN) injection 4 mg  4 mg Intravenous Q6H PRN Nicole Kindred A, DO      . oxyCODONE  (Oxy IR/ROXICODONE) immediate release tablet 5 mg  5 mg Oral Q4H PRN Nicole Kindred A, DO   5 mg at 05/27/19 1827  . potassium chloride SA (KLOR-CON) CR tablet 40 mEq  40 mEq Oral Once Nicole Kindred A, DO      . senna-docusate (Senokot-S) tablet 1 tablet  1 tablet Oral QHS PRN Ezekiel Slocumb, DO      . traMADol Veatrice Bourbon) tablet 50 mg  50 mg Oral Q6H PRN Ezekiel Slocumb, DO         Discharge Medications: Please see discharge summary for a list of discharge medications.  Relevant Imaging Results:  Relevant Lab Results:   Additional Information SSN: [id number]  Mairlyn Tegtmeyer, Gardiner Rhyme, LCSW

## 2019-05-28 NOTE — Progress Notes (Addendum)
PROGRESS NOTE    Gerald [name]  [id number] DOB: [birth year] DOA: 05/27/2019  PCP: [name], MD    LOS - 1   Brief Narrative:  Gerald [name] is a 86 y.o. male with medical history significant of chronic anemia secondary to CKD and myelodysplastic syndrome followed by hematology, A. fib, hypertension, prostate cancer, testicular cancer, hemochromatosis presented to the ED today with left hip pain after mechanical fall at home.  Found to have left femoral neck fracture on xray in the ED.  Admitted to hospitalist service with orthopedics consult.  Plan is for surgery 3/12 at 1 pm.  Labs notable for mild leukocytosis, likely reactive in setting of acute injury, and anemia slightly worse than baseline.  Subjective 3/12: Patient seen resting comfortably this AM.  He reports good control of his left hip pain, denies pain at rest.  Reports he slept well.  He complains of very dry mouth and lips while NPO for surgery.  Denies fever/chills, chest pain, SOB or other acute complaints.    Assessment & Plan:   Principal Problem:   Left displaced femoral neck fracture (HCC) Active Problems:   A-fib (HCC)   Essential (primary) hypertension   Anemia of chronic disease   Leukocytosis   Hemochromatosis   MDS (myelodysplastic syndrome), low grade (HCC)   Left femoral neck fracture - secondary to mechanical fall --ortho consulted, Dr. Rudene Christians --surgery this afternoon --resume diet after surgery --pain control with scheduled Tylenol, PRN tramadol or oxycodone, IV morphine for breakthrough pain --bowel regimen --PT evaluation post-op --NOTE: patient's daughter specifically requested that epidural be used for surgery instead of general anesthesia.  Family feel general anesthesia too high risk with his age, and they knew an elderly person who passed away after surgery for hip fracture.    Paroxysmal A-fib (HCC) - rate controlled.  Not on anticoagulation or rate control meds. Sinus  rhythm with ectopy on ECG. --telemetry monitoring  Leukocytosis - likely reactive in setting of acute fracture.  No evidence of infection. --monitor for signs/symptoms of infection --CBC's daily  QTc Prolongation - QTc 573 on ECG at time of admission --telemetry monitoring --avoid QT prolonging agents as much as possible --maintain K>4 and Mg>2 --serial ECG's to monitor  Essential (primary) hypertension - chronic, stable.  Takes lisinopril/HCTZ at home. --hold lisinopril/HCTZ for now --PRN oral hydralazine or IV labetalol  Anemia of chronic disease - no evidence of active bleeding.  Hbg 7.9 on admission. --type and cross --monitor CBC --transfuse if Hbg < 7  Hemochromatosis MDS (myelodysplastic syndrome), low grade (HCC) --follows with heme/onc for both, and anemia --monitor CBC and renal function    DVT prophylaxis: SCD's until post-op   Code Status: Full Code  Family Communication: none at bedside during encounter, will call daughter   Disposition Plan:  D/C to SNF vs home with [facility] PT, pending post-op PT evaluation. Coming From home Exp DC Date 3/14 Barriers surgery today, PT eval Medically Stable for Discharge? no   Consultants:   Orthopedics, Dr. Rudene Christians  Procedures:   None  Antimicrobials:   None    Objective: Vitals:   05/27/19 1757 05/27/19 2309 05/28/19 0817 05/28/19 1244  BP: 137/74 128/63 (!) 150/70 140/69  Pulse: 87 88 99 97  Resp: 18 17 (!) 24 20  Temp: (!) 97.3 F (36.3 C) (!) 97.4 F (36.3 C) 98.5 F (36.9 C) 99.5 F (37.5 C)  TempSrc: Oral Oral Oral Tympanic  SpO2: 98% 94% 93% 93%  Weight:  66 kg  Height:    5\' 9"  (1.753 m)    Intake/Output Summary (Last 24 hours) at 05/28/2019 1330 Last data filed at 05/28/2019 0600 Gross per 24 hour  Intake 771.01 ml  Output 100 ml  Net 671.01 ml   Filed Weights   05/27/19 1428 05/28/19 1244  Weight: 66.7 kg 66 kg    Examination:  General exam: awake, alert, no acute distress HEENT:  dry mucus membranes, hearing grossly normal, atraumatic Respiratory system: CTAB, no wheezes, rales or rhonchi, normal respiratory effort. Cardiovascular system: normal S1/S2, RRR, no JVD, murmurs, rubs, gallops, no pedal edema.   Gastrointestinal system: soft, NT, ND, no HSM felt, +bowel sounds. Central nervous system: A&O x3. no gross focal neurologic deficits, normal speech Extremities: moves all, left lower extremity externally rotated Skin: dry, intact, normal temperature, normal color Psychiatry: normal mood, congruent affect, judgement and insight appear normal    Data Reviewed: I have personally reviewed following labs and imaging studies  CBC: Recent Labs  Lab 05/24/19 1417 05/27/19 1430 05/28/19 0521  WBC  --  12.6* 11.4*  HGB 8.5* 7.9* 7.8*  HCT 25.8* 23.6* 23.0*  MCV  --  97.9 97.0  PLT  --  458* [id number]   Basic Metabolic Panel: Recent Labs  Lab 05/27/19 1430 05/28/19 0521  NA 143 142  K 3.5 3.4*  CL 110 111  CO2 24 23  GLUCOSE 129* 137*  BUN 29* 27*  CREATININE 0.98 0.90  CALCIUM 8.9 8.7*   GFR: Estimated Creatinine Clearance: 44.8 mL/min (by C-G formula based on SCr of 0.9 mg/dL). Liver Function Tests: No results for input(s): AST, ALT, ALKPHOS, BILITOT, PROT, ALBUMIN in the last 168 hours. No results for input(s): LIPASE, AMYLASE in the last 168 hours. No results for input(s): AMMONIA in the last 168 hours. Coagulation Profile: Recent Labs  Lab 05/27/19 1430  INR 1.3*   Cardiac Enzymes: No results for input(s): CKTOTAL, CKMB, CKMBINDEX, TROPONINI in the last 168 hours. BNP (last 3 results) No results for input(s): PROBNP in the last 8760 hours. HbA1C: No results for input(s): HGBA1C in the last 72 hours. CBG: No results for input(s): GLUCAP in the last 168 hours. Lipid Profile: No results for input(s): CHOL, HDL, LDLCALC, TRIG, CHOLHDL, LDLDIRECT in the last 72 hours. Thyroid Function Tests: No results for input(s): TSH, T4TOTAL, FREET4,  T3FREE, THYROIDAB in the last 72 hours. Anemia Panel: No results for input(s): VITAMINB12, FOLATE, FERRITIN, TIBC, IRON, RETICCTPCT in the last 72 hours. Sepsis Labs: No results for input(s): PROCALCITON, LATICACIDVEN in the last 168 hours.  Recent Results (from the past 240 hour(s))  SARS CORONAVIRUS 2 (TAT 6-24 HRS) Nasopharyngeal Nasopharyngeal Swab     Status: None   Collection Time: 05/27/19  4:49 PM   Specimen: Nasopharyngeal Swab  Result Value Ref Range Status   SARS Coronavirus 2 NEGATIVE NEGATIVE Final    Comment: (NOTE) SARS-CoV-2 target nucleic acids are NOT DETECTED. The SARS-CoV-2 RNA is generally detectable in upper and lower respiratory specimens during the acute phase of infection. Negative results do not preclude SARS-CoV-2 infection, do not rule out co-infections with other pathogens, and should not be used as the sole basis for treatment or other patient management decisions. Negative results must be combined with clinical observations, patient history, and epidemiological information. The expected result is Negative. Fact Sheet for Patients: SugarRoll.be Fact Sheet for Healthcare Providers: https://www.woods-mathews.com/ This test is not yet approved or cleared by the [place] and  has been authorized  for detection and/or diagnosis of SARS-CoV-2 by FDA under an Emergency Use Authorization (EUA). This EUA will remain  in effect (meaning this test can be used) for the duration of the COVID-19 declaration under Section 56 4(b)(1) of the Act, 21 U.S.C. section 360bbb-3(b)(1), unless the authorization is terminated or revoked sooner. Performed at [facility], [place] [address], [place], [place] [address]   Surgical pcr screen     Status: None   Collection Time: 05/27/19 10:30 PM   Specimen: Nasal Mucosa; Nasal Swab  Result Value Ref Range Status   MRSA, PCR NEGATIVE NEGATIVE Final   Staphylococcus aureus  NEGATIVE NEGATIVE Final    Comment: (NOTE) The Xpert SA Assay (FDA approved for NASAL specimens in patients 48 years of age and older), is one component of a comprehensive surveillance program. It is not intended to diagnose infection nor to guide or monitor treatment. Performed at [facility], [address], [place], [place] [address]          Radiology Studies: DG Chest 1 View  Result Date: 05/27/2019 CLINICAL DATA:  Recent fall with known left hip fracture, initial encounter EXAM: CHEST  1 VIEW COMPARISON:  05/05/2013 FINDINGS: Cardiac shadow is enlarged. Tortuous thoracic aorta with atherosclerotic calcifications is again noted. Mild interstitial changes are seen which may be related to a mild degree of CHF. No focal infiltrate or effusion is seen. No bony abnormality is noted. IMPRESSION: Mild increased interstitial changes which may be related to some vascular congestion and mild CHF. Electronically Signed   By: Inez Catalina M.D.   On: 05/27/2019 15:35   DG Hip Unilat W or Wo Pelvis 2-3 Views Left  Result Date: 05/27/2019 CLINICAL DATA:  Recent fall with hip pain, initial encounter EXAM: DG HIP (WITH OR WITHOUT PELVIS) 2-3V LEFT COMPARISON:  None. FINDINGS: Subcapital left femoral neck fracture is noted with impaction and angulation at the fracture site. Pelvic ring is intact. Chronic degenerative changes of the hip joints are seen as well as the lumbar spine. Aortic and iliac calcifications are seen. IMPRESSION: Left femoral neck fracture with impaction and angulation at the fracture site. Electronically Signed   By: Inez Catalina M.D.   On: 05/27/2019 15:30        Scheduled Meds: . [MAR Hold] acetaminophen  650 mg Oral Q6H  . [MAR Hold] docusate sodium  100 mg Oral BID  . [MAR Hold] potassium chloride  40 mEq Oral Once   Continuous Infusions: . sodium chloride 75 mL/hr at 05/28/19 0720  . ceFAZolin    . [MAR Hold]  ceFAZolin (ANCEF) IV       LOS: 1 day     Time spent: 30 minutes    Ezekiel Slocumb, DO [facility]   If 7PM-7AM, please contact night-coverage [email or web address] 05/28/2019, 1:30 PM

## 2019-05-28 NOTE — Anesthesia Preprocedure Evaluation (Addendum)
Anesthesia Evaluation  Patient identified by MRN, date of birth, ID band Patient awake    Reviewed: Allergy & Precautions, NPO status , Patient's Chart, lab work & pertinent test results  History of Anesthesia Complications Negative for: history of anesthetic complications  Airway Mallampati: II       Dental   Pulmonary neg pulmonary ROS, former smoker,    Pulmonary exam normal        Cardiovascular hypertension, Pt. on medications and Pt. on home beta blockers Normal cardiovascular exam+ dysrhythmias Atrial Fibrillation + Valvular Problems/Murmurs (Mitral incompetance)      Neuro/Psych negative neurological ROS     GI/Hepatic negative GI ROS, Neg liver ROS,   Endo/Other  negative endocrine ROS  Renal/GU negative Renal ROS     Musculoskeletal  (+) Arthritis , Osteoarthritis,    Abdominal Normal abdominal exam  (+)   Peds  Hematology  (+) anemia ,   Anesthesia Other Findings   Reproductive/Obstetrics                             Anesthesia Physical  Anesthesia Plan  ASA: III  Anesthesia Plan: Spinal   Post-op Pain Management:    Induction: Intravenous  PONV Risk Score and Plan:   Airway Management Planned: Nasal Cannula  Additional Equipment:   Intra-op Plan:   Post-operative Plan:   Informed Consent: I have reviewed the patients History and Physical, chart, labs and discussed the procedure including the risks, benefits and alternatives for the proposed anesthesia with the patient or authorized representative who has indicated his/her understanding and acceptance.     Dental advisory given  Plan Discussed with: CRNA and Surgeon  Anesthesia Plan Comments:       Anesthesia Quick Evaluation

## 2019-05-28 NOTE — Transfer of Care (Signed)
Immediate Anesthesia Transfer of Care Note  Patient: Gerald [name]  Procedure(s) Performed: Procedure(s): ANTERIOR APPROACH HEMI HIP ARTHROPLASTY (Left)  Patient Location: PACU  Anesthesia Type:Spinal  Level of Consciousness: sedated  Airway & Oxygen Therapy: Patient Spontanous Breathing and Patient connected to face mask oxygen  Post-op Assessment: Report given to RN and Post -op Vital signs reviewed and stable  Post vital signs: Reviewed and stable  Last Vitals:  Vitals:   05/28/19 1539 05/28/19 1543  BP:  122/72  Pulse: (!) 102 (!) 102  Resp: (!) 27 (!) 27  Temp: 36.9 C 36.9 C  SpO2: 99991111 [id number]    Complications: No apparent anesthesia complications

## 2019-05-28 NOTE — Op Note (Signed)
05/28/2019  3:49 PM  PATIENT:  Gerald [name]  86 y.o. male  PRE-OPERATIVE DIAGNOSIS:  Left hip fracture femoral neck  POST-OPERATIVE DIAGNOSIS:  left hip fracture femoral neck  PROCEDURE:  Procedure(s): ANTERIOR APPROACH HEMI HIP ARTHROPLASTY (Left)  SURGEON: Laurene Footman, MD  ASSISTANTS: None  ANESTHESIA:   spinal  EBL:  Total I/O In: 1060 [I.V.:700; Blood:360] Out: 550 [Urine:300; Blood:250]  BLOOD ADMINISTERED:1 CC PRBC  DRAINS: none   LOCAL MEDICATIONS USED:  MARCAINE    and OTHER Exparel  SPECIMEN:  Source of Specimen:  Femoral head  DISPOSITION OF SPECIMEN:  PATHOLOGY  COUNTS:  YES  TOURNIQUET:  * No tourniquets in log *  IMPLANTS: Medacta 5 standard AMIS cemented stem with S metal 28 mm head and 50 bipolar head  DICTATION: .Dragon Dictation   The patient was brought to the operating room and after spinal anesthesia was obtained patient was placed on the operative table with the ipsilateral foot into the Medacta attachment, contralateral leg on a well-padded table. C-arm was brought in and preop template x-ray taken. After prepping and draping in usual sterile fashion appropriate patient identification and timeout procedures were completed. Anterior approach to the hip was obtained and centered over the greater trochanter and TFL muscle. The subcutaneous tissue was incised hemostasis being achieved by electrocautery. TFL fascia was incised and the muscle retracted laterally deep retractor placed. The lateral femoral circumflex vessels were identified and ligated. The anterior capsule was exposed and a capsulotomy performed. The neck was identified and a femoral neck cut carried out with a saw.  The fracture did appear to have some level of stress fracture present, the head was removed without difficulty and showed no significant degenerative change.  The head sized between 49 and 50 and the 50 trial fit best.. The leg was then externally rotated and ischiofemoral  and pubofemoral releases carried out. The femur was sequentially broached to a size 5, and cemented 5 stem was inserted after placing a cement restrictor and drying the canal.  The S 28 mm head was applied along with the 50 mm bipolar head and the hip reduced.  The hip was reduced and was stable the wound was thoroughly irrigated with fibrillar placed along the posterior capsule and medial neck. The deep fascia ws closed using a heavy Quill after infiltration of 30 cc of quarter percent Sensorcaine with epinephrine which was infiltrated throughout the case along with the Exparel .3-0 V-loc to close the skin with skin staples. Xeroform and honeycomb dressing applied patient was sent to recovery in stable condition.   PLAN OF CARE: Continue as inpatient

## 2019-05-28 NOTE — Progress Notes (Signed)
OT Cancellation Note  Patient Details Name: Gerald [name] MRN: [id number] DOB: 1920/10/12   Cancelled Treatment:    Reason Eval/Treat Not Completed: Other (comment)  OT consult received and chart reviewed. Pt scheduled for hip sx this date. OT orders to be completed due to pt change in status pending surgery, orthopedic clearance for physical therapy and weight bearing orders. Thank you.  Gerrianne Scale, [place], OTR/L ascom [phone] 05/28/19, 8:25 AM

## 2019-05-28 NOTE — Anesthesia Procedure Notes (Signed)
Spinal  Patient location during procedure: OR Start time: 05/28/2019 1:54 PM End time: 05/28/2019 1:58 PM Staffing Performed: resident/CRNA  Anesthesiologist: Alvin Critchley, MD Resident/CRNA: Gentry Fitz, CRNA Preanesthetic Checklist Completed: patient identified, IV checked, site marked, risks and benefits discussed, surgical consent, monitors and equipment checked and pre-op evaluation Spinal Block Patient position: sitting Prep: ChloraPrep Patient monitoring: continuous pulse ox, blood pressure and heart rate Approach: midline Location: L4-5 Injection technique: single-shot Needle Needle type: Pencan  Needle gauge: 24 G Assessment Sensory level: T10 Additional Notes Patient given 25mg  ketamine x 2.  20 mg propofol given.  Patient placed in sitting position.  Landmarks identified.  Area prepped with chloraprep.  Sterile drape applied.  44ml of 2% lidocaine localized to L4/5 interspace.  20G introducer placed.  24G pencan passed smoothly x 1 pass.  +CSF, heme that cleared, negative parathesia.  2.6ml of 0.5% marcaine placed smoothly.  Patient placed supine on OR table. VSS.  Tolerated procedure well.  Pencan Spinal Needle Tray Lot# [id number] Exp: 2020-02-15

## 2019-05-28 NOTE — TOC Progression Note (Signed)
Transition of Care [facility]) - Progression Note    Patient Details  Name: ARVILL LIBENGOOD MRN: [id number] Date of Birth: 10-19-1920  Transition of Care [facility]) CM/SW Contact  Fedora Knisely, Gardiner Rhyme, LCSW Phone Number: 05/28/2019, 12:02 PM  Clinical Narrative:  Pt lives with his daughter was independent with rw prior to admission until fall. Will await surgery and see what the plan will be, have completed FL2 in case need for short term rehab. See how therapies go with pt. Daughter would like for him to go home from the hospital. Work on safest discharge plan for pt.          Expected Discharge Plan and Services                                                 Social Determinants of Health (SDOH) Interventions    Readmission Risk Interventions No flowsheet data found.

## 2019-05-29 LAB — TYPE AND SCREEN
ABO/RH(D): O POS
Antibody Screen: NEGATIVE
Unit division: 0

## 2019-05-29 LAB — CBC
HCT: 23.2 % — ABNORMAL LOW (ref 39.0–52.0)
Hemoglobin: 8.2 g/dL — ABNORMAL LOW (ref 13.0–17.0)
MCH: 33.2 pg (ref 26.0–34.0)
MCHC: 35.3 g/dL (ref 30.0–36.0)
MCV: 93.9 fL (ref 80.0–100.0)
Platelets: 326 10*3/uL (ref 150–400)
RBC: 2.47 MIL/uL — ABNORMAL LOW (ref 4.22–5.81)
RDW: 22.5 % — ABNORMAL HIGH (ref 11.5–15.5)
WBC: 13.9 10*3/uL — ABNORMAL HIGH (ref 4.0–10.5)
nRBC: 0.4 % — ABNORMAL HIGH (ref 0.0–0.2)

## 2019-05-29 LAB — BASIC METABOLIC PANEL
Anion gap: 8 (ref 5–15)
BUN: 27 mg/dL — ABNORMAL HIGH (ref 8–23)
CO2: 20 mmol/L — ABNORMAL LOW (ref 22–32)
Calcium: 7.9 mg/dL — ABNORMAL LOW (ref 8.9–10.3)
Chloride: 114 mmol/L — ABNORMAL HIGH (ref 98–111)
Creatinine, Ser: 1.11 mg/dL (ref 0.61–1.24)
GFR calc Af Amer: >60 mL/min (ref >60)
GFR calc non Af Amer: 56 mL/min — ABNORMAL LOW (ref >60)
Glucose, Bld: 134 mg/dL — ABNORMAL HIGH (ref 70–99)
Potassium: 3.7 mmol/L (ref 3.5–5.1)
Sodium: 142 mmol/L (ref 135–145)

## 2019-05-29 LAB — MAGNESIUM: Magnesium: 1.8 mg/dL (ref 1.7–2.4)

## 2019-05-29 LAB — BPAM RBC
Blood Product Expiration Date: 202104042359
ISSUE DATE / TIME: 202103121340
Unit Type and Rh: 5100

## 2019-05-29 NOTE — Progress Notes (Signed)
OT Cancellation Note  Patient Details Name: Gerald [name] MRN: [id number] DOB: Aug 10, 1920   Cancelled Treatment:    Reason Eval/Treat Not Completed: Patient at procedure or test/ unavailable(Pt. was with PT. Will reattempt initial OT visit at a later time today.)  Harrel Carina, MS, OTR/L 05/29/2019, 10:28 AM

## 2019-05-29 NOTE — Progress Notes (Signed)
PROGRESS NOTE    Gerald [name]  [id number] DOB: [birth year] DOA: 05/27/2019  PCP: Gerald Pink, MD    LOS - 2   Brief Narrative:  Gerald Cowper [name] a [id number] y.o.malewith medical history significant ofchronic anemia secondary to CKD and myelodysplastic syndrome followed by hematology, A. fib, hypertension, prostate cancer, testicular cancer, hemochromatosis presented to the ED today with left hip pain after mechanical fall at home.Found to have left femoral neck fracture on xray in the ED.  Admitted to hospitalist service with orthopedics consult.  Plan is for surgery 3/12 at 1 pm.  Labs notable for mild leukocytosis, likely reactive in setting of acute injury, and anemia slightly worse than baseline.  Underwent left hip hemiarthroplasty with Dr. Rudene Christians on 3/12.  Subjective 3/13: POD #1 -patient seen examined this morning with daughter Gerald [name] at bedside.  Patient denies pain at rest, but does have significant left hip pain with any movement.  He denies chest pain or shortness of breath, nausea vomiting or diarrhea.  Reports good appetite, daughter states he ate all his breakfast.  We discussed PT and eventual discharge to rehab versus home with home health which she states family needs to discuss further.   Assessment & Plan:   Principal Problem:   Left displaced femoral neck fracture (HCC) Active Problems:   A-fib (HCC)   Essential (primary) hypertension   Anemia of chronic disease   Leukocytosis   Hemochromatosis   MDS (myelodysplastic syndrome), low grade (HCC)   Left femoral neck fracture- secondary to mechanical fall.  Status post left hip hemiarthroplasty on 3/21. --ortho consulted, Dr. Rudene Christians --pain control with scheduled Tylenol, PRN tramadol or oxycodone, IV morphine for breakthrough pain --bowel regimen --PT evaluation post-op --please ensure pain controlled before PT sessions so patient able to tolerate  ParoxysmalA-fib ([place])- rate controlled. Not on  anticoagulation or rate control meds. Sinus rhythm with ectopy on ECG. --telemetry monitoring  Leukocytosis - likely reactive in setting of acute fracture.  No evidence of infection. --monitor for signs/symptoms of infection --CBC's daily  QTc Prolongation - QTc 573 on ECG at time of admission --telemetry monitoring --avoid QT prolonging agents as much as possible --maintain K>4 and Mg>2 --serial ECG's to monitor  Essential (primary) hypertension- chronic, stable. Takes lisinopril/HCTZ at home. --hold lisinopril/HCTZ for now --PRN oral hydralazine or IV labetalol  Anemia of chronic disease- no evidence of active bleeding. Hbg 7.9 on admission. --type and cross --monitor CBC --transfuse if Hbg < 7  Hemochromatosis MDS (myelodysplastic syndrome), low grade (HCC) --follows with heme/onc for both, and anemia --monitor CBC and renal function   DVT prophylaxis: Lovenox, SCDs, TED hose   Code Status: Full Code  Family Communication: Daughter Gerald [name] at bedside during encounter, updated and questions answered  Disposition Plan: Discharge to SNF versus home with home health PT pending further improvement and clearance by orthopedics. Coming From home Exp DC Date 3/15 Barriers as above Medically Stable for Discharge?  No  Consultants:   Orthopedics  Procedures:   3/21 -left hip hemiarthroplasty  Antimicrobials:   None   Objective: Vitals:   05/28/19 1840 05/28/19 2152 05/29/19 0010 05/29/19 0458  BP: (!) 143/68 (!) 141/56 (!) 149/70 (!) 133/59  Pulse: 100 97 92 89  Resp: (!) 22 18 16 16   Temp:  98.1 F (36.7 C) 98.6 F (37 C) 98.7 F (37.1 C)  TempSrc:   Oral Oral  SpO2: 93% 95% 96% 95%  Weight:      Height:  Intake/Output Summary (Last 24 hours) at 05/29/2019 0803 Last data filed at 05/29/2019 0600 Gross per 24 hour  Intake 2119.7 ml  Output 751 ml  Net 1368.7 ml   Filed Weights   05/27/19 1428 05/28/19 1244  Weight: 66.7 kg 66 kg     Examination:  General exam: awake, alert, no acute distress HEENT: atraumatic, clear conjunctiva, anicteric sclera, moist mucus membranes, hearing grossly normal  Respiratory system: CTAB, no wheezes, rales or rhonchi, normal respiratory effort. Cardiovascular system: normal S1/S2, RRR, no pedal edema.   Gastrointestinal system: soft, NT, ND, no HSM felt, +bowel sounds. Central nervous system: A&O x3. no gross focal neurologic deficits, normal speech Extremities: moves all, no cyanosis, normal tone, distal sensation and pedal pulse intact of left lower extremity Skin: dry, intact, normal temperature, normal color Psychiatry: normal mood, congruent affect, judgement and insight appear normal    Data Reviewed: I have personally reviewed following labs and imaging studies  CBC: Recent Labs  Lab 05/24/19 1417 05/27/19 1430 05/28/19 0521 05/28/19 1739 05/29/19 0427  WBC  --  12.6* 11.4*  --  13.9*  HGB 8.5* 7.9* 7.8* 9.7* 8.2*  HCT 25.8* 23.6* 23.0* 28.7* 23.2*  MCV  --  97.9 97.0  --  93.9  PLT  --  458* 354  --  [id number]   Basic Metabolic Panel: Recent Labs  Lab 05/27/19 1430 05/28/19 0521 05/28/19 1739 05/29/19 0427  NA 143 142  --  142  K 3.5 3.4*  --  3.7  CL 110 111  --  114*  CO2 24 23  --  20*  GLUCOSE 129* 137*  --  134*  BUN 29* 27*  --  27*  CREATININE 0.98 0.90 0.91 1.11  CALCIUM 8.9 8.7*  --  7.9*  MG  --   --   --  1.8   GFR: Estimated Creatinine Clearance: 36.3 mL/min (by C-G formula based on SCr of 1.11 mg/dL). Liver Function Tests: No results for input(s): AST, ALT, ALKPHOS, BILITOT, PROT, ALBUMIN in the last 168 hours. No results for input(s): LIPASE, AMYLASE in the last 168 hours. No results for input(s): AMMONIA in the last 168 hours. Coagulation Profile: Recent Labs  Lab 05/27/19 1430  INR 1.3*   Cardiac Enzymes: No results for input(s): CKTOTAL, CKMB, CKMBINDEX, TROPONINI in the last 168 hours. BNP (last 3 results) No results for  input(s): PROBNP in the last 8760 hours. HbA1C: No results for input(s): HGBA1C in the last 72 hours. CBG: No results for input(s): GLUCAP in the last 168 hours. Lipid Profile: No results for input(s): CHOL, HDL, LDLCALC, TRIG, CHOLHDL, LDLDIRECT in the last 72 hours. Thyroid Function Tests: No results for input(s): TSH, T4TOTAL, FREET4, T3FREE, THYROIDAB in the last 72 hours. Anemia Panel: No results for input(s): VITAMINB12, FOLATE, FERRITIN, TIBC, IRON, RETICCTPCT in the last 72 hours. Sepsis Labs: No results for input(s): PROCALCITON, LATICACIDVEN in the last 168 hours.  Recent Results (from the past 240 hour(s))  SARS CORONAVIRUS 2 (TAT 6-24 HRS) Nasopharyngeal Nasopharyngeal Swab     Status: None   Collection Time: 05/27/19  4:49 PM   Specimen: Nasopharyngeal Swab  Result Value Ref Range Status   SARS Coronavirus 2 NEGATIVE NEGATIVE Final    Comment: (NOTE) SARS-CoV-2 target nucleic acids are NOT DETECTED. The SARS-CoV-2 RNA is generally detectable in upper and lower respiratory specimens during the acute phase of infection. Negative results do not preclude SARS-CoV-2 infection, do not rule out co-infections with other pathogens, and should  not be used as the sole basis for treatment or other patient management decisions. Negative results must be combined with clinical observations, patient history, and epidemiological information. The expected result is Negative. Fact Sheet for Patients: SugarRoll.be Fact Sheet for Healthcare Providers: https://www.woods-mathews.com/ This test is not yet approved or cleared by the [place] FDA and  has been authorized for detection and/or diagnosis of SARS-CoV-2 by FDA under an Emergency Use Authorization (EUA). This EUA will remain  in effect (meaning this test can be used) for the duration of the COVID-19 declaration under Section 56 4(b)(1) of the Act, 21 U.S.C. section 360bbb-3(b)(1),  unless the authorization is terminated or revoked sooner. Performed at [facility], [place] [address] [place], [place] [address]   Surgical pcr screen     Status: None   Collection Time: 05/27/19 10:30 PM   Specimen: Nasal Mucosa; Nasal Swab  Result Value Ref Range Status   MRSA, PCR NEGATIVE NEGATIVE Final   Staphylococcus aureus NEGATIVE NEGATIVE Final    Comment: (NOTE) The Xpert SA Assay (FDA approved for NASAL specimens in patients 13 years of age and older), is one component of a comprehensive surveillance program. It is not intended to diagnose infection nor to guide or monitor treatment. Performed at [facility], [address] [place], [place] [address]          Radiology Studies: DG Chest 1 View  Result Date: 05/27/2019 CLINICAL DATA:  Recent fall with known left hip fracture, initial encounter EXAM: CHEST  1 VIEW COMPARISON:  05/05/2013 FINDINGS: Cardiac shadow is enlarged. Tortuous thoracic aorta with atherosclerotic calcifications is again noted. Mild interstitial changes are seen which may be related to a mild degree of CHF. No focal infiltrate or effusion is seen. No bony abnormality is noted. IMPRESSION: Mild increased interstitial changes which may be related to some vascular congestion and mild CHF. Electronically Signed   By: Inez Catalina M.D.   On: 05/27/2019 15:35   DG HIP OPERATIVE UNILAT W OR W/O PELVIS LEFT  Result Date: 05/28/2019 CLINICAL DATA:  Left femoral neck fracture EXAM: OPERATIVE LEFT HIP WITH PELVIS COMPARISON:  05/27/2019 FLUOROSCOPY TIME:  Radiation Exposure Index (as provided by the fluoroscopic device): 1.5 mGy If the device does not provide the exposure index: Fluoroscopy Time:  6 seconds Number of Acquired Images:  2 FINDINGS: Left hip prosthesis is noted in satisfactory position. IMPRESSION: Status post left hip replacement Electronically Signed   By: Inez Catalina M.D.   On: 05/28/2019 15:35   DG HIP UNILAT W OR W/O PELVIS  2-3 VIEWS LEFT  Result Date: 05/28/2019 CLINICAL DATA:  Postoperative for left total hip prosthesis placement EXAM: DG HIP (WITH OR WITHOUT PELVIS) 2-3V LEFT COMPARISON:  05/28/2019 intraoperative radiographs, and preoperative assessment of 05/27/2019 FINDINGS: Frontal and cross-table lateral images of the left total hip prosthesis demonstrate expected orientation and appearance without periprosthetic fracture or acute complicating feature. Bony demineralization is present. Atherosclerotic vascular calcifications are also noted. Small amount of gas present in the lateral soft tissues along with skin staples. IMPRESSION: Left total hip prosthesis in place without complicating feature observed. Electronically Signed   By: Van Clines M.D.   On: 05/28/2019 16:15   DG Hip Unilat W or Wo Pelvis 2-3 Views Left  Result Date: 05/27/2019 CLINICAL DATA:  Recent fall with hip pain, initial encounter EXAM: DG HIP (WITH OR WITHOUT PELVIS) 2-3V LEFT COMPARISON:  None. FINDINGS: Subcapital left femoral neck fracture is noted with impaction and  angulation at the fracture site. Pelvic ring is intact. Chronic degenerative changes of the hip joints are seen as well as the lumbar spine. Aortic and iliac calcifications are seen. IMPRESSION: Left femoral neck fracture with impaction and angulation at the fracture site. Electronically Signed   By: Inez Catalina M.D.   On: 05/27/2019 15:30        Scheduled Meds: . acetaminophen  650 mg Oral Q6H  . docusate sodium  100 mg Oral BID  . enoxaparin (LOVENOX) injection  40 mg Subcutaneous Q24H   Continuous Infusions: . sodium chloride 75 mL/hr at 05/29/19 0743     LOS: 2 days    Time spent: 30 minutes    Ezekiel Slocumb, DO [facility]   If 7PM-7AM, please contact night-coverage [email or web address] 05/29/2019, 8:03 AM

## 2019-05-29 NOTE — Evaluation (Signed)
Occupational Therapy Evaluation Patient Details Name: Gerald [name] MRN: [id number] DOB: 1920-11-23 Today's Date: 05/29/2019    History of Present Illness Pt. is a 86 y.o. male who was admitted to [facility] with left hip pain. Pt. sustained a Displaced Left Femoral Neck Fracture. Pt. underwent at Hemiarthrplasty repair. PMHx includes: chronic anemia secondary to CKD and myelodysplastic syndrome followed by hematology, A. fib, hypertension, prostate cancer, testicular cancer, and hemochromatosis.   Clinical Impression   Pt. presents with LLE pain, fatigue, weakness, limited activity tolerance, and limited functional mobility which limits the ability to complete basic ADL and IADL functioning. Pt. Resides at home with his daughter, and her family. Pt. was independent with self dressing, had assist with bathing. Pt. Takes sinkside baths, and showers every 2 weeks. Pt. Required assist with meal preparation, and medication management. Pt. Does not drive. Pt. Independently used a walker at home. Pt. Education was provided about A/E use for LE ADLs. Pt. needs additional review to practice with the A/E for LE ADLs. SO2 93-94% on RA. Pt. Could benefit from OT services for ADL training, A/E training, and pt. education about home modification, and DME. Pt. would benefit from SNF level of care upon discharge with follow-up OT services at discharge.    Follow Up Recommendations  SNF    Equipment Recommendations       Recommendations for Other Services       Precautions / Restrictions Precautions Precautions: Anterior Hip Restrictions Weight Bearing Restrictions: Yes LLE Weight Bearing: Weight bearing as tolerated      Mobility Bed Mobility Overal bed mobility: Needs Assistance Bed Mobility: Supine to Sit     Supine to sit: Mod assist;HOB elevated     General bed mobility comments: Independent  Transfers Overall transfer level: Needs assistance Equipment used: Rolling walker (2  wheeled) Transfers: Sit to/from [facility] Sit to Stand: Mod assist Stand pivot transfers: Mod assist       General transfer comment: Deferred.     Balance                       ADL either performed or assessed with clinical judgement   ADL Overall ADL's : Needs assistance/impaired Eating/Feeding: Set up;Independent   Grooming: Set up;Independent   Upper Body Bathing: Set up;Minimal assistance   Lower Body Bathing: Set up;Maximal assistance   Upper Body Dressing : Set up;Minimal assistance   Lower Body Dressing: Set up;Maximal assistance                 General ADL Comments: Pt. education was provided about A/E use for LE ADLs.     Vision Baseline Vision/History: Wears glasses Patient Visual Report: No change from baseline       Perception     Praxis      Pertinent Vitals/Pain Pain Assessment: 0-10 Pain Score: 6  Pain Location: Left hip with movement Pain Descriptors / Indicators: Aching Pain Intervention(s): Limited activity within patient's tolerance;Repositioned     Hand Dominance Right   Extremity/Trunk Assessment Upper Extremity Assessment Upper Extremity Assessment: Overall WFL for tasks assessed         Communication Communication Communication: No difficulties   Cognition Arousal/Alertness: Awake/alert;Lethargic Behavior During Therapy: WFL for tasks assessed/performed Overall Cognitive Status: Within Functional Limits for tasks assessed  General Comments    Exercises   Shoulder Instructions      Home Living Family/patient expects to be discharged to:: Skilled nursing facility Living Arrangements: Children;Other relatives Available Help at Discharge: Family Type of Home: House Home Access: Stairs to enter [employer] of Steps: 4 Entrance Stairs-Rails: None Home Layout: Multi-level;Bed/bath upstairs Alternate Level Stairs-Number of Steps:  13 stairs to upstairs bedroom  (split level home, steps to entry)             Home Equipment: Walker - 2 wheels;Shower seat          Prior Functioning/Environment Level of Independence: Needs assistance    ADL's / Homemaking Assistance Needed: Daughter assists with meal preparation, and medication management. Independent dressing, assist with bathing. Pt. takes sinkside baths, and showers every 2 weeks.   Comments: Ind with ADLs and ambulates with RW        OT Problem List: Decreased strength;Decreased cognition;Pain;Impaired UE functional use;Decreased knowledge of use of DME or AE;Decreased range of motion      OT Treatment/Interventions: Self-care/ADL training;Therapeutic exercise    OT Goals(Current goals can be found in the care plan section) Acute Rehab OT Goals Patient Stated Goal: to go back home and walk around again OT Goal Formulation: With patient Potential to Achieve Goals: Good  OT Frequency: Min 2X/week   Barriers to D/C:            Co-evaluation              AM-PAC OT "6 Clicks" Daily Activity     Outcome Measure Help from another person eating meals?: None Help from another person taking care of personal grooming?: None Help from another person toileting, which includes using toliet, bedpan, or urinal?: A Lot Help from another person bathing (including washing, rinsing, drying)?: A Lot Help from another person to put on and taking off regular upper body clothing?: A Little Help from another person to put on and taking off regular lower body clothing?: A Lot 6 Click Score: 17   End of Session    Activity Tolerance: Patient tolerated treatment well Patient left: in bed  OT Visit Diagnosis: Muscle weakness (generalized) (M62.81)                Time: 1120-1140 OT Time Calculation (min): 20 min Charges:  OT General Charges $OT Visit: 1 Visit OT Evaluation $OT Eval Low Complexity: 1 Low Harrel Carina, MS, OTR/L Harrel Carina 05/29/2019, 12:38 PM

## 2019-05-29 NOTE — Progress Notes (Signed)
Subjective: 1 Day Post-Op Procedure(s) (LRB): ANTERIOR APPROACH HEMI HIP ARTHROPLASTY (Left) Patient reports pain as mild.   Patient is well but does seem confused this morning. PT and care management to assist with discharge planning. Negative for chest pain and shortness of breath Fever: no Gastrointestinal:Negative for nausea and vomiting  Objective: Vital signs in last 24 hours: Temp:  [97.4 F (36.3 C)-99.5 F (37.5 C)] 99.1 F (37.3 C) (03/13 0900) Pulse Rate:  [89-104] 92 (03/13 0900) Resp:  [16-27] 16 (03/13 0900) BP: (110-149)/(56-75) 142/62 (03/13 0900) SpO2:  [92 %-100 %] 95 % (03/13 0900) Weight:  [66 kg] 66 kg (03/12 1244)  Intake/Output from previous day:  Intake/Output Summary (Last 24 hours) at 05/29/2019 1025 Last data filed at 05/29/2019 0600 Gross per 24 hour  Intake 2119.7 ml  Output 751 ml  Net 1368.7 ml    Intake/Output this shift: No intake/output data recorded.  Labs: Recent Labs    05/27/19 1430 05/28/19 0521 05/28/19 1739 05/29/19 0427  HGB 7.9* 7.8* 9.7* 8.2*   Recent Labs    05/28/19 0521 05/28/19 0521 05/28/19 1739 05/29/19 0427  WBC 11.4*  --   --  13.9*  RBC 2.37*  --   --  2.47*  HCT 23.0*   < > 28.7* 23.2*  PLT 354  --   --  326   < > = values in this interval not displayed.   Recent Labs    05/28/19 0521 05/28/19 0521 05/28/19 1739 05/29/19 0427  NA 142  --   --  142  K 3.4*  --   --  3.7  CL 111  --   --  114*  CO2 23  --   --  20*  BUN 27*  --   --  27*  CREATININE 0.90   < > 0.91 1.11  GLUCOSE 137*  --   --  134*  CALCIUM 8.7*  --   --  7.9*   < > = values in this interval not displayed.   Recent Labs    05/27/19 1430  INR 1.3*     EXAM General - Patient is Alert but does not seem oriented. Extremity - ABD soft Neurovascular intact Sensation intact distally Intact pulses distally Dorsiflexion/Plantar flexion intact Incision: dressing C/D/I No cellulitis present Dressing/Incision - clean, dry,  no drainage Motor Function - intact, moving foot and toes well on exam.   Past Medical History:  Diagnosis Date  . Anemia   . Atrial fibrillation ([place])   . Cancer [facility])    Testicular  . Hemochromatosis 10/25/2014  . Hx of basal cell carcinoma 2008   R. ant. zygomatic/sideburn 2008, L preauricular 2019  . Hypertension   . Prostate cancer ([place])     Assessment/Plan: 1 Day Post-Op Procedure(s) (LRB): ANTERIOR APPROACH HEMI HIP ARTHROPLASTY (Left) Principal Problem:   Left displaced femoral neck fracture (HCC) Active Problems:   A-fib (HCC)   Essential (primary) hypertension   Hemochromatosis   Anemia of chronic disease   MDS (myelodysplastic syndrome), low grade (HCC)   Leukocytosis  Estimated body mass index is 21.49 kg/m as calculated from the following:   Height as of this encounter: 5\' 9"  (1.753 m).   Weight as of this encounter: 66 kg. Advance diet Up with therapy D/C IV fluids when tolerating po intake.  Patient seems confused.  I have not meet him before but according to notes no underlying dementia.  Will continue to monitor.  Monitor pain meds today. Work  with PT today. Begin working on BM. Labs reviewed, Hg 8.2 this Am.    DVT Prophylaxis - Lovenox, Foot Pumps and TED hose Weight-Bearing as tolerated to left leg  J. Cameron Proud, PA-C [facility] Orthopaedic Surgery 05/29/2019, 10:25 AM

## 2019-05-29 NOTE — Anesthesia Postprocedure Evaluation (Signed)
Anesthesia Post Note  Patient: Gerald [name]  Procedure(s) Performed: ANTERIOR APPROACH HEMI HIP ARTHROPLASTY (Left )  Patient location during evaluation: Other Anesthesia Type: Spinal Level of consciousness: awake and alert Pain management: pain level controlled Respiratory status: spontaneous breathing and nonlabored ventilation Cardiovascular status: stable Postop Assessment: no apparent nausea or vomiting, spinal receding and no headache Anesthetic complications: no     Last Vitals:  Vitals:   05/29/19 0458 05/29/19 0900  BP: (!) 133/59 (!) 142/62  Pulse: 89 92  Resp: 16 16  Temp: 37.1 C 37.3 C  SpO2: 95% 95%    Last Pain:  Vitals:   05/29/19 0900  TempSrc: Oral  PainSc:                  Tera Mater

## 2019-05-29 NOTE — Evaluation (Signed)
Physical Therapy Evaluation Patient Details Name: Gerald [name] MRN: [id number] DOB: [birth year] Today's Date: 05/29/2019   History of Present Illness  Gerald [name] is a 86 y.o. male with medical history significant of chronic anemia secondary to CKD and myelodysplastic syndrome followed by hematology, A. fib, hypertension, prostate cancer, testicular cancer, hemochromatosis presented to the ED today with left hip pain after mechanical fall at home.  He reports associated low back pain.  Clinical Impression  Pt is a pleasant 86 year old M who is s/p L THA (05/28/2019) after sustaining L femoral neck fracture from fall at home. Pt's PLOF is reportedly independent with ADLs and household and community ambulation with 3-wheeled rollator.  Pt performs bed mobility with mod A for trunk and LLE management and transfers with mod A with RW; ambulation deferred today secondary to fatigue and weakness. Pt performs supine<>sit with HOB elevated and mod A for trunk and LLE management and is able to assist with bridging utilizing RLE to scoot to top of bed. Pt performs STS transfer from elevated bedside with rolling walker requiring mod A for liftoff and to achieve full hip extension, as well as cuing for upright posture/forward gaze. Once standing, pt performs standing pivot transfer from bedside to [facility] utilizing RW requiring mod A for weight shift and balance. Once seated on BSC, pt performs bowel movement and upon initial standing attempt, continues to have BM but is unaware. Pt requiring assistance +2 to perform hygiene following BM, standing with mod A and RW while second person performs hygiene; will recommend OT consult. Vitals monitored throughout mobility, remaining stable, though pt is visibly fatigued following pivot transfers to/from bedside, deferring further gait trial this session. Pt assisted back in bed requiring mod A for sit>supine for trunk and LLE management, and NT notified of need for  gown and condom catheter replacement.   Pt demonstrates deficits with strength, range of motion, balance and pain requiring continued skilled PT intervention for improved functional mobility.      Follow Up Recommendations SNF    Equipment Recommendations  Rolling walker with 5" wheels;3in1 (PT)    Recommendations for Other Services OT consult     Precautions / Restrictions Precautions Precautions: Anterior Hip Restrictions Weight Bearing Restrictions: Yes LLE Weight Bearing: Weight bearing as tolerated      Mobility  Bed Mobility Overal bed mobility: Needs Assistance Bed Mobility: Supine to Sit     Supine to sit: Mod assist;HOB elevated     General bed mobility comments: for trunk and LLE management  Transfers Overall transfer level: Needs assistance Equipment used: Rolling walker (2 wheeled) Transfers: Sit to/from [facility] Sit to Stand: Mod assist Stand pivot transfers: Mod assist       General transfer comment: VCs for safe hand placement and sequencing; Tactile/verbal cues and mod A to achieve upright position (to fully extend hips/knees), and cues for forward gaze. Able to perform stand pivot transfer with RW from bedside to bedside commode; vitals remaining stable though pt visibly fatigued following transfer  Ambulation/Gait             General Gait Details: Deferred gait trial today secondary to pt's visible fatigue/weakness for safety concerns  Stairs            Wheelchair Mobility    Modified Rankin (Stroke Patients Only)       Balance Overall balance assessment: Needs assistance Sitting-balance support: No upper extremity supported;Feet supported Sitting balance-Leahy Scale: Good Sitting balance - Comments:  Able to sit steady edge of bed and scoot forward/backward   Standing balance support: Bilateral upper extremity supported Standing balance-Leahy Scale: Fair Standing balance comment: Heavy reliance on UEs with  RW; able to perform marching with RW support and min A                             Pertinent Vitals/Pain Pain Assessment: 0-10 Pain Score: 6  Pain Location: L hip, at rest, supine in bed; reports 3/10 at end of session Pain Descriptors / Indicators: Aching Pain Intervention(s): Limited activity within patient's tolerance;Repositioned    Home Living Family/patient expects to be discharged to:: [place]: Children;Other relatives Available Help at Discharge: Family Type of Home: House Home Access: Stairs to enter Entrance Stairs-Rails: None Entrance Stairs-Number of Steps: 4 Home Layout: Multi-level;Bed/bath upstairs Home Equipment: Environmental consultant - 2 wheels;Shower seat      Prior Function Level of Independence: Independent with assistive device(s)         Comments: Ind with ADLs and ambulates with RW     Hand Dominance        Extremity/Trunk Assessment   Upper Extremity Assessment Upper Extremity Assessment: Overall WFL for tasks assessed    Lower Extremity Assessment Lower Extremity Assessment: LLE deficits/detail;Generalized weakness LLE Sensation: WNL       Communication   Communication: No difficulties  Cognition Arousal/Alertness: Awake/alert(slightly lethargic initially, alertness increases once sitting up bedside) Behavior During Therapy: WFL for tasks assessed/performed Overall Cognitive Status: Within Functional Limits for tasks assessed                                        General Comments      Exercises Total Joint Exercises Ankle Circles/Pumps: Both;10 reps;AROM Quad Sets: Strengthening;Both;10 reps   Assessment/Plan    PT Assessment Patient needs continued PT services  PT Problem List Decreased strength;Decreased mobility;Decreased safety awareness;Decreased range of motion;Decreased activity tolerance;Decreased balance;Pain       PT Treatment Interventions DME  instruction;Therapeutic exercise;Gait training;Balance training;Stair training;Neuromuscular re-education;Functional mobility training;Therapeutic activities;Patient/family education    PT Goals (Current goals can be found in the Care Plan section)  Acute Rehab PT Goals Patient Stated Goal: to go back home and walk around again PT Goal Formulation: With patient/family Time For Goal Achievement: 06/12/19 Potential to Achieve Goals: Good    Frequency BID   Barriers to discharge        Co-evaluation               AM-PAC PT "6 Clicks" Mobility  Outcome Measure Help needed turning from your back to your side while in a flat bed without using bedrails?: A Little Help needed moving from lying on your back to sitting on the side of a flat bed without using bedrails?: A Little Help needed moving to and from a bed to a chair (including a wheelchair)?: A Lot Help needed standing up from a chair using your arms (e.g., wheelchair or bedside chair)?: A Lot Help needed to walk in hospital room?: A Lot Help needed climbing 3-5 steps with a railing? : Total 6 Click Score: 13    End of Session Equipment Utilized During Treatment: Gait belt Activity Tolerance: Patient tolerated treatment well;Patient limited by fatigue Patient left: in bed;with family/visitor present Nurse Communication: Other (comment)(nurse tech notified of need for gown change and  replacement of condom catheter) PT Visit Diagnosis: Unsteadiness on feet (R26.81);Muscle weakness (generalized) (M62.81);Difficulty in walking, not elsewhere classified (R26.2);Pain Pain - Right/Left: Left Pain - part of body: Hip    Time: [id number] PT Time Calculation (min) (ACUTE ONLY): 55 min   Charges:   PT Evaluation $PT Eval Moderate Complexity: 1 Mod PT Treatments $Therapeutic Activity: 23-37 mins        Petra Kuba, PT, DPT 05/29/19, 11:26 AM

## 2019-05-29 NOTE — Progress Notes (Signed)
PT Cancellation Note  Patient Details Name: Gerald [name] MRN: [id number] DOB: 12-05-1920   Cancelled Treatment:     PT attempt. Was cleared by RN to participate but she does report pt has been asleep all day since previous PT session. Upon entering room, Pt's daughter at bedside and requested not waking pt up. She requested PT return next date. PT will continue following pt per POC.    Willette Pa 05/29/2019, 4:42 PM

## 2019-05-30 ENCOUNTER — Inpatient Hospital Stay: Payer: Medicare Other

## 2019-05-30 LAB — CBC
HCT: 23.1 % — ABNORMAL LOW (ref 39.0–52.0)
Hemoglobin: 7.9 g/dL — ABNORMAL LOW (ref 13.0–17.0)
MCH: 32.4 pg (ref 26.0–34.0)
MCHC: 34.2 g/dL (ref 30.0–36.0)
MCV: 94.7 fL (ref 80.0–100.0)
Platelets: 317 10*3/uL (ref 150–400)
RBC: 2.44 MIL/uL — ABNORMAL LOW (ref 4.22–5.81)
RDW: 22.5 % — ABNORMAL HIGH (ref 11.5–15.5)
WBC: 11 10*3/uL — ABNORMAL HIGH (ref 4.0–10.5)
nRBC: 0.5 % — ABNORMAL HIGH (ref 0.0–0.2)

## 2019-05-30 LAB — BASIC METABOLIC PANEL
Anion gap: 8 (ref 5–15)
BUN: 28 mg/dL — ABNORMAL HIGH (ref 8–23)
CO2: 20 mmol/L — ABNORMAL LOW (ref 22–32)
Calcium: 8 mg/dL — ABNORMAL LOW (ref 8.9–10.3)
Chloride: 113 mmol/L — ABNORMAL HIGH (ref 98–111)
Creatinine, Ser: 0.98 mg/dL (ref 0.61–1.24)
GFR calc Af Amer: >60 mL/min (ref >60)
GFR calc non Af Amer: >60 mL/min (ref >60)
Glucose, Bld: 116 mg/dL — ABNORMAL HIGH (ref 70–99)
Potassium: 3.6 mmol/L (ref 3.5–5.1)
Sodium: 141 mmol/L (ref 135–145)

## 2019-05-30 LAB — MAGNESIUM: Magnesium: 1.7 mg/dL (ref 1.7–2.4)

## 2019-05-30 MED ORDER — MAGNESIUM SULFATE 2 GM/50ML IV SOLN
2.0000 g | Freq: Once | INTRAVENOUS | Status: AC
  Administered 2019-05-30: 08:00:00 2 g via INTRAVENOUS
  Filled 2019-05-30: qty 50

## 2019-05-30 MED ORDER — ENOXAPARIN SODIUM 40 MG/0.4ML ~~LOC~~ SOLN: 40.0000 mg | SUBCUTANEOUS | 0 refills | Status: DC

## 2019-05-30 MED ORDER — POLYETHYLENE GLYCOL 3350 17 G PO PACK
17.0000 g | PACK | Freq: Every day | ORAL | Status: DC
  Administered 2019-05-31 – 2019-06-01 (×2): 17 g via ORAL
  Filled 2019-05-30 (×3): qty 1

## 2019-05-30 MED ORDER — OXYCODONE HCL 5 MG PO TABS: 5.0000 mg | ORAL_TABLET | ORAL | 0 refills | Status: DC | PRN

## 2019-05-30 NOTE — Progress Notes (Signed)
Physical Therapy Treatment Patient Details Name: Gerald [name] MRN: [id number] DOB: Nov 14, 1920 Today's Date: 05/30/2019    History of Present Illness Pt. is a 86 y.o. male who was admitted to [facility] with left hip pain. Pt. sustained a Displaced Left Femoral Neck Fracture. Pt. underwent at Hemiarthrplasty repair. PMHx includes: chronic anemia secondary to CKD and myelodysplastic syndrome followed by hematology, A. fib, hypertension, prostate cancer, testicular cancer, and hemochromatosis.    PT Comments     Pt feeling better, ready for session.  Participated in exercises as described below.  To EOB with mod a x 1. Steady once sitting.  Attempted standing +1 but unable to clear bed despite assist.  +2 min/mod a and he was able to stand and take several small steps to recliner.  Overall increased activity tolerance.  Remained in recliner.  Further activity deferred so he would tolerate OOB for lunch and not fatigue out too quickly.    Daughter in for session.  She remains unsure about discharge.  Stated she hoped to take him home but "he needs to get around by himself"  Education provided about recovery.  Anticipate pt will still need +1-+2 assist upon discharge.  Recommended +2 assist at home for mobility if she chooses to discharge home.  He will need Wheelchair and commode along with walker if she chooses that path but anticipate they will agree to SNF if needed.      Follow Up Recommendations  SNF     Equipment Recommendations  Rolling walker with 5" wheels;3in1 (PT)    Recommendations for Other Services       Precautions / Restrictions Precautions Precautions: Anterior Hip Restrictions Weight Bearing Restrictions: Yes LLE Weight Bearing: Weight bearing as tolerated    Mobility  Bed Mobility Overal bed mobility: Needs Assistance Bed Mobility: Supine to Sit     Supine to sit: Mod assist;HOB elevated        Transfers Overall transfer level: Needs  assistance Equipment used: Rolling walker (2 wheeled) Transfers: Sit to/from Stand Sit to Stand: Mod assist;+2 physical assistance         General transfer comment: does well with transfer to chair today  Ambulation/Gait Ambulation/Gait assistance: Min assist;+2 physical assistance Gait Distance (Feet): 3 Feet Assistive device: Rolling walker (2 wheeled) Gait Pattern/deviations: Step-to pattern Gait velocity: decreased   General Gait Details: steps to chair but no true forward gait.d   Stairs             Wheelchair Mobility    Modified Rankin (Stroke Patients Only)       Balance Overall balance assessment: Needs assistance Sitting-balance support: No upper extremity supported;Feet supported Sitting balance-Leahy Scale: Good     Standing balance support: Bilateral upper extremity supported Standing balance-Leahy Scale: Fair Standing balance comment: Heavy reliance on UEs with RW;                            Cognition Arousal/Alertness: Awake/alert Behavior During Therapy: WFL for tasks assessed/performed Overall Cognitive Status: Within Functional Limits for tasks assessed                                        Exercises Other Exercises Other Exercises: LLE AAROM- ankle pumps, heel slides, ab/add and quad sets x 10    General Comments        Pertinent Vitals/Pain  Pain Assessment: Faces Faces Pain Scale: Hurts little more Pain Location: Left hip with movement Pain Descriptors / Indicators: Aching;Sore Pain Intervention(s): Limited activity within patient's tolerance;Monitored during session;Repositioned    Home Living                      Prior Function            PT Goals (current goals can now be found in the care plan section) Progress towards PT goals: Progressing toward goals    Frequency    BID      PT Plan Current plan remains appropriate    Co-evaluation              AM-PAC PT "6  Clicks" Mobility   Outcome Measure  Help needed turning from your back to your side while in a flat bed without using bedrails?: A Little Help needed moving from lying on your back to sitting on the side of a flat bed without using bedrails?: A Little Help needed moving to and from a bed to a chair (including a wheelchair)?: A Lot Help needed standing up from a chair using your arms (e.g., wheelchair or bedside chair)?: A Lot Help needed to walk in hospital room?: A Lot Help needed climbing 3-5 steps with a railing? : Total 6 Click Score: 13    End of Session Equipment Utilized During Treatment: Gait belt Activity Tolerance: Patient tolerated treatment well;Patient limited by fatigue Patient left: in chair;with call bell/phone within reach;with chair alarm set;with family/visitor present Nurse Communication: Mobility status Pain - Right/Left: Left Pain - part of body: Hip     Time: 1137-1200 PT Time Calculation (min) (ACUTE ONLY): 23 min  Charges:  $Therapeutic Exercise: 8-22 mins $Therapeutic Activity: 8-22 mins                    Chesley Noon, PTA 05/30/19, 1:03 PM

## 2019-05-30 NOTE — Progress Notes (Signed)
[place] coordinated pt.'s priest's visit for annointing of the sick per [facility]'s prior discussions w/pt.'s family.  Priest from pt.'s parish in [place] arrived and administered ritual as well as bedside communion.  pt. and pt.'s dtr appreciative for priest's visit.  [place] will attempt to follow up if possible.    05/28/19 1130  Clinical Encounter Type  Visited With Patient and family together;Other (Comment) (Clergy)  Visit Type Follow-up;Spiritual support;Pre-op  Referral From Chaplain;Patient's clergy;Nurse  Consult/Referral To Chaplain  Spiritual Encounters  Spiritual Needs Ritual;Emotional;Prayer;Sacred text  Stress Factors  Patient Stress Factors Health changes;Major life changes  Family Stress Factors Major life changes;Health changes

## 2019-05-30 NOTE — Discharge Instructions (Signed)
ANTERIOR APPROACH TOTAL HIP REPLACEMENT POSTOPERATIVE DIRECTIONS   Hip Rehabilitation, Guidelines Following Surgery  The results of a hip operation are greatly improved after range of motion and muscle strengthening exercises. Follow all safety measures which are given to protect your hip. If any of these exercises cause increased pain or swelling in your joint, decrease the amount until you are comfortable again. Then slowly increase the exercises. Call your caregiver if you have problems or questions.   HOME CARE INSTRUCTIONS  Remove items at home which could result in a fall. This includes throw rugs or furniture in walking pathways.   ICE to the affected hip every three hours for 30 minutes at a time and then as needed for pain and swelling.  Continue to use ice on the hip for pain and swelling from surgery. You may notice swelling that will progress down to the foot and ankle.  This is normal after surgery.  Elevate the leg when you are not up walking on it.    Continue to use the breathing machine which will help keep your temperature down.  It is common for your temperature to cycle up and down following surgery, especially at night when you are not up moving around and exerting yourself.  The breathing machine keeps your lungs expanded and your temperature down.  Do not place pillow under knee, focus on keeping the knee straight while resting  DIET You may resume your previous home diet once your are discharged from the hospital.  DRESSING / WOUND CARE / SHOWERING You may change your dressing 3-5 days after surgery.  Then change the dressing every day with sterile gauze.  Please use good hand washing techniques before changing the dressing.  Do not use any lotions or creams on the incision until instructed by your surgeon. Keep your dressing dry with showering.  You can keep it covered and pat dry. Change the surgical dressing daily and reapply a dry dressing each time.  ACTIVITY Walk  with your walker as instructed. Use walker as long as suggested by your caregivers. Avoid periods of inactivity such as sitting longer than an hour when not asleep. This helps prevent blood clots.  You may resume a sexual relationship in one month or when given the OK by your doctor.  You may return to work once you are cleared by your doctor.  Do not drive a car for 6 weeks or until released by you surgeon.  Do not drive while taking narcotics.  WEIGHT BEARING Weight bearing as tolerated with assist device (walker, cane, etc) as directed, use it as long as suggested by your surgeon or therapist, typically at least 4-6 weeks.  POSTOPERATIVE CONSTIPATION PROTOCOL Constipation - defined medically as fewer than three stools per week and severe constipation as less than one stool per week.  One of the most common issues patients have following surgery is constipation.  Even if you have a regular bowel pattern at home, your normal regimen is likely to be disrupted due to multiple reasons following surgery.  Combination of anesthesia, postoperative narcotics, change in appetite and fluid intake all can affect your bowels.  In order to avoid complications following surgery, here are some recommendations in order to help you during your recovery period.  Colace (docusate) - Pick up an over-the-counter form of Colace or another stool softener and take twice a day as long as you are requiring postoperative pain medications.  Take with a full glass of water daily.  If   you experience loose stools or diarrhea, hold the colace until you stool forms back up.  If your symptoms do not get better within 1 week or if they get worse, check with your doctor.  Dulcolax (bisacodyl) - Pick up over-the-counter and take as directed by the product packaging as needed to assist with the movement of your bowels.  Take with a full glass of water.  Use this product as needed if not relieved by Colace only.   MiraLax  (polyethylene glycol) - Pick up over-the-counter to have on hand.  MiraLax is a solution that will increase the amount of water in your bowels to assist with bowel movements.  Take as directed and can mix with a glass of water, juice, soda, coffee, or tea.  Take if you go more than two days without a movement. Do not use MiraLax more than once per day. Call your doctor if you are still constipated or irregular after using this medication for 7 days in a row.  If you continue to have problems with postoperative constipation, please contact the office for further assistance and recommendations.  If you experience "the worst abdominal pain ever" or develop nausea or vomiting, please contact the office immediatly for further recommendations for treatment.  ITCHING  If you experience itching with your medications, try taking only a single pain pill, or even half a pain pill at a time.  You can also use Benadryl over the counter for itching or also to help with sleep.   TED HOSE STOCKINGS Wear the elastic stockings on both legs for three weeks following surgery during the day but you may remove then at night for sleeping.  MEDICATIONS See your medication summary on the "After Visit Summary" that the nursing staff will review with you prior to discharge.  You may have some home medications which will be placed on hold until you complete the course of blood thinner medication.  It is important for you to complete the blood thinner medication as prescribed by your surgeon.  Continue your approved medications as instructed at time of discharge.  PRECAUTIONS If you experience chest pain or shortness of breath - call 911 immediately for transfer to the hospital emergency department.  If you develop a fever greater that 101 F, purulent drainage from wound, increased redness or drainage from wound, foul odor from the wound/dressing, or calf pain - CONTACT YOUR SURGEON.                                                    FOLLOW-UP APPOINTMENTS Make sure you keep all of your appointments after your operation with your surgeon and caregivers. You should call the office at the above phone number and make an appointment for approximately two weeks after the date of your surgery or on the date instructed by your surgeon outlined in the "After Visit Summary".  RANGE OF MOTION AND STRENGTHENING EXERCISES  These exercises are designed to help you keep full movement of your hip joint. Follow your caregiver's or physical therapist's instructions. Perform all exercises about fifteen times, three times per day or as directed. Exercise both hips, even if you have had only one joint replacement. These exercises can be done on a training (exercise) mat, on the floor, on a table or on a bed. Use whatever works the best and   is most comfortable for you. Use music or television while you are exercising so that the exercises are a pleasant break in your day. This will make your life better with the exercises acting as a break in routine you can look forward to.  Lying on your back, slowly slide your foot toward your buttocks, raising your knee up off the floor. Then slowly slide your foot back down until your leg is straight again.  Lying on your back spread your legs as far apart as you can without causing discomfort.  Lying on your side, raise your upper leg and foot straight up from the floor as far as is comfortable. Slowly lower the leg and repeat.  Lying on your back, tighten up the muscle in the front of your thigh (quadriceps muscles). You can do this by keeping your leg straight and trying to raise your heel off the floor. This helps strengthen the largest muscle supporting your knee.  Lying on your back, tighten up the muscles of your buttocks both with the legs straight and with the knee bent at a comfortable angle while keeping your heel on the floor.   IF YOU ARE TRANSFERRED TO A SKILLED REHAB FACILITY If the patient is  transferred to a skilled rehab facility following release from the hospital, a list of the current medications will be sent to the facility for the patient to continue.  When discharged from the skilled rehab facility, please have the facility set up the patient's Home Health Physical Therapy prior to being released. Also, the skilled facility will be responsible for providing the patient with their medications at time of release from the facility to include their pain medication, the muscle relaxants, and their blood thinner medication. If the patient is still at the rehab facility at time of the two week follow up appointment, the skilled rehab facility will also need to assist the patient in arranging follow up appointment in our office and any transportation needs.  MAKE SURE YOU:  Understand these instructions.  Get help right away if you are not doing well or get worse.    Pick up stool softner and laxative for home use following surgery while on pain medications. Do not submerge incision under water. Please use good hand washing techniques while changing dressing each day. May shower starting three days after surgery. Please use a clean towel to pat the incision dry following showers. Continue to use ice for pain and swelling after surgery. Do not use any lotions or creams on the incision until instructed by your surgeon. 

## 2019-05-30 NOTE — Progress Notes (Addendum)
Subjective: 2 Days Post-Op Procedure(s) (LRB): ANTERIOR APPROACH HEMI HIP ARTHROPLASTY (Left) Patient reports pain as mild this morning. Patient is well, much more coherent this AM than yesterday. Current plan is for d/c to SNF when appropriate. Negative for chest pain and shortness of breath Fever: no Gastrointestinal:Negative for nausea and vomiting  Objective: Vital signs in last 24 hours: Temp:  [98 F (36.7 C)-99.4 F (37.4 C)] 98 F (36.7 C) (03/14 0855) Pulse Rate:  [78-96] 96 (03/14 0855) Resp:  [17-19] 18 (03/14 0855) BP: (124-138)/(60-70) 136/70 (03/14 0855) SpO2:  [93 %-96 %] 94 % (03/14 0855)  Intake/Output from previous day:  Intake/Output Summary (Last 24 hours) at 05/30/2019 0953 Last data filed at 05/30/2019 0759 Gross per 24 hour  Intake 2038.03 ml  Output --  Net 2038.03 ml    Intake/Output this shift: Total I/O In: 1337.3 [I.V.:1336.5; IV Piggyback:0.8] Out: -   Labs: Recent Labs    05/27/19 1430 05/28/19 0521 05/28/19 1739 05/29/19 0427 05/30/19 0549  HGB 7.9* 7.8* 9.7* 8.2* 7.9*   Recent Labs    05/29/19 0427 05/30/19 0549  WBC 13.9* 11.0*  RBC 2.47* 2.44*  HCT 23.2* 23.1*  PLT 326 317   Recent Labs    05/29/19 0427 05/30/19 0549  NA 142 141  K 3.7 3.6  CL 114* 113*  CO2 20* 20*  BUN 27* 28*  CREATININE 1.11 0.98  GLUCOSE 134* 116*  CALCIUM 7.9* 8.0*   Recent Labs    05/27/19 1430  INR 1.3*     EXAM General - Patient is Alert but does not seem oriented. Extremity - ABD soft Neurovascular intact Sensation intact distally Intact pulses distally Dorsiflexion/Plantar flexion intact Incision: dressing C/D/I No cellulitis present Dressing/Incision - clean, dry, no drainage Motor Function - intact, moving foot and toes well on exam.   Past Medical History:  Diagnosis Date  . Anemia   . Atrial fibrillation ([place])   . Cancer [facility] [place])    Testicular  . Hemochromatosis 10/25/2014  . Hx of basal cell carcinoma 2008   R.  ant. zygomatic/sideburn 2008, L preauricular 2019  . Hypertension   . Prostate cancer ([place])     Assessment/Plan: 2 Days Post-Op Procedure(s) (LRB): ANTERIOR APPROACH HEMI HIP ARTHROPLASTY (Left) Principal Problem:   Left displaced femoral neck fracture (HCC) Active Problems:   A-fib (HCC)   Essential (primary) hypertension   Hemochromatosis   Anemia of chronic disease   MDS (myelodysplastic syndrome), low grade (HCC)   Leukocytosis  Estimated body mass index is 21.49 kg/m as calculated from the following:   Height as of this encounter: 5\' 9"  (1.753 m).   Weight as of this encounter: 66 kg. Advance diet Up with therapy  Much more alert today. Labs reviewed, Hg 7.9.  Denies any dizziness this AM. Work with PT today. Begin working on BM.  Upon discharge from hospital, follow-up with [place] in 10-14 days for staple removal. Continue Lovenox 40mg  daily for 14 days.  DVT Prophylaxis - Lovenox, Foot Pumps and TED hose Weight-Bearing as tolerated to left leg  J. Cameron Proud, PA-C [facility] Orthopaedic Surgery 05/30/2019, 9:53 AM    Addendum: The patient also complains of left knee pain and swelling which has been present since the fall.  He was able to get from his bed to the chair with physical therapy, but did notice pain in his knee.    On examination, there is no significant swelling, erythema, ecchymosis, abrasions, or other skin abnormalities identified.  He has mild tenderness to palpation around the knee, as well as a 1+ effusion.  He is able to perform straight leg raise, although with pain.  The is able tolerate range of motion from 0 to 90 degrees.  He is neurovascularly intact to the left lower extremity and foot.  We will proceed with ordering x-rays of the left knee to be sure that he has not sustained any injury to this joint following his fall.  Pascal Lux, MD [facility] Orthopaedic Surgery 05/30/2019, 12:55 PM

## 2019-05-30 NOTE — Progress Notes (Signed)
PROGRESS NOTE    Gerald [name]  [id number] DOB: 11/07/1920 DOA: 05/27/2019  PCP: [name], MD    LOS - 3   Brief Narrative:  Gerald [name] 86 y.o.malewith medical history significant ofchronic anemia secondary to CKD and myelodysplastic syndrome followed by hematology, A. fib, hypertension, prostate cancer, testicular cancer, hemochromatosis presented to the ED today with left hip pain after mechanical fall at home.Found to have left femoral neck fracture on xray in the ED. Admitted to hospitalist service with orthopedics consult. Plan is for surgery 3/12 at 1 pm. Labs notable for mild leukocytosis, likely reactive in setting of acute injury, and anemia slightly worse than baseline.  Underwent left hip hemiarthroplasty with Dr. Rudene Christians on 3/12.  Subjective 3/14: Patient seen at bedside this morning. No acute events reported overnight. He was sleeping but awoke easily. He denied any pain at that time. Also denied any other acute symptoms including fever, chills, chest pain shortness of breath, nausea vomiting or diarrhea. States he thinks he has had a bowel movement.   Assessment & Plan:   Principal Problem:   Left displaced femoral neck fracture (HCC) Active Problems:   A-fib (HCC)   Essential (primary) hypertension   Anemia of chronic disease   Leukocytosis   Hemochromatosis   MDS (myelodysplastic syndrome), low grade (HCC)   Left femoral neck fracture- secondary to mechanical fall.  Status post left hip hemiarthroplasty on 3/21. --ortho consulted, Dr. Rudene Christians --pain control with scheduled Tylenol, PRN tramadol or oxycodone, IV morphine for breakthrough pain --bowel regimen --PT evaluation post-op --please ensure pain controlled before PT sessions so patient able to tolerate --Weightbearing as tolerated on left lower extremity --DVT prophylaxis with Lovenox x14 days, SCDs and TED hose --Follow-up with [facility] in 10 to 14 days for staple  removal  ParoxysmalA-fib ([place])- rate controlled. Not on anticoagulation or rate control meds. Sinus rhythm with ectopy on ECG. --telemetry monitoring  Leukocytosis- likely reactive in setting of acute fracture. No evidence of infection. --monitor for signs/symptoms of infection --CBC's daily  QTc Prolongation - QTc 573 on ECG at time of admission --telemetry monitoring --avoid QT prolonging agents as much as possible --maintain K>4 and Mg>2 --serial ECG's to monitor  Essential (primary) hypertension- chronic, stable. Takes lisinopril/HCTZ at home. --hold lisinopril/HCTZ for now --PRN oral hydralazine or IV labetalol  Anemia of chronic disease- no evidence of active bleeding. Hbg 7.9 on admission. --type and cross --monitor CBC --transfuse if Hbg < 7  Hemochromatosis MDS (myelodysplastic syndrome), low grade (HCC) --follows with heme/onc for both, and anemia --monitor CBC and renal function   DVT prophylaxis: Lovenox, SCDs, TED hose   Code Status: Full Code  Family Communication: Daughter [name] at bedside during encounter, updated and questions answered  Disposition Plan: Discharge to SNF versus home with home health PT pending further improvement and clearance by orthopedics.  Current therapy recommendation is SNF. Coming From home Exp DC Date 3/15 Barriers as above Medically Stable for Discharge?  No  Consultants:   Orthopedics  Procedures:   3/21 -left hip hemiarthroplasty  Antimicrobials:   None    Objective: Vitals:   05/29/19 0900 05/29/19 1058 05/29/19 1510 05/29/19 2244  BP: (!) 142/62  130/70 124/60  Pulse: 92  86 82  Resp: 16  19 17   Temp: 99.1 F (37.3 C)  98.1 F (36.7 C) 99.4 F (37.4 C)  TempSrc: Oral  Oral Oral  SpO2: 95% 95% 96% 93%  Weight:      Height:  Intake/Output Summary (Last 24 hours) at 05/30/2019 0728 Last data filed at 05/29/2019 1910 Gross per 24 hour  Intake 700.76 ml  Output --  Net  700.76 ml   Filed Weights   05/27/19 1428 05/28/19 1244  Weight: 66.7 kg 66 kg    Examination:  General exam: awake, alert, no acute distress Respiratory system: CTAB, no wheezes, rales or rhonchi, normal respiratory effort. Cardiovascular system: normal S1/S2, RRR, no JVD, murmurs, rubs, gallops, no pedal edema.   Extremities: no edema, normal tone, left lower extremity distal sensation intact    Data Reviewed: I have personally reviewed following labs and imaging studies  CBC: Recent Labs  Lab 05/27/19 1430 05/28/19 0521 05/28/19 1739 05/29/19 0427 05/30/19 0549  WBC 12.6* 11.4*  --  13.9* 11.0*  HGB 7.9* 7.8* 9.7* 8.2* 7.9*  HCT 23.6* 23.0* 28.7* 23.2* 23.1*  MCV 97.9 97.0  --  93.9 94.7  PLT 458* 354  --  326 [id number]   Basic Metabolic Panel: Recent Labs  Lab 05/27/19 1430 05/28/19 0521 05/28/19 1739 05/29/19 0427 05/30/19 0549  NA 143 142  --  142 141  K 3.5 3.4*  --  3.7 3.6  CL 110 111  --  114* 113*  CO2 24 23  --  20* 20*  GLUCOSE 129* 137*  --  134* 116*  BUN 29* 27*  --  27* 28*  CREATININE 0.98 0.90 0.91 1.11 0.98  CALCIUM 8.9 8.7*  --  7.9* 8.0*  MG  --   --   --  1.8 1.7   GFR: Estimated Creatinine Clearance: 41.2 mL/min (by C-G formula based on SCr of 0.98 mg/dL). Liver Function Tests: No results for input(s): AST, ALT, ALKPHOS, BILITOT, PROT, ALBUMIN in the last 168 hours. No results for input(s): LIPASE, AMYLASE in the last 168 hours. No results for input(s): AMMONIA in the last 168 hours. Coagulation Profile: Recent Labs  Lab 05/27/19 1430  INR 1.3*   Cardiac Enzymes: No results for input(s): CKTOTAL, CKMB, CKMBINDEX, TROPONINI in the last 168 hours. BNP (last 3 results) No results for input(s): PROBNP in the last 8760 hours. HbA1C: No results for input(s): HGBA1C in the last 72 hours. CBG: No results for input(s): GLUCAP in the last 168 hours. Lipid Profile: No results for input(s): CHOL, HDL, LDLCALC, TRIG, CHOLHDL, LDLDIRECT in  the last 72 hours. Thyroid Function Tests: No results for input(s): TSH, T4TOTAL, FREET4, T3FREE, THYROIDAB in the last 72 hours. Anemia Panel: No results for input(s): VITAMINB12, FOLATE, FERRITIN, TIBC, IRON, RETICCTPCT in the last 72 hours. Sepsis Labs: No results for input(s): PROCALCITON, LATICACIDVEN in the last 168 hours.  Recent Results (from the past 240 hour(s))  SARS CORONAVIRUS 2 (TAT 6-24 HRS) Nasopharyngeal Nasopharyngeal Swab     Status: None   Collection Time: 05/27/19  4:49 PM   Specimen: Nasopharyngeal Swab  Result Value Ref Range Status   SARS Coronavirus 2 NEGATIVE NEGATIVE Final    Comment: (NOTE) SARS-CoV-2 target nucleic acids are NOT DETECTED. The SARS-CoV-2 RNA is generally detectable in upper and lower respiratory specimens during the acute phase of infection. Negative results do not preclude SARS-CoV-2 infection, do not rule out co-infections with other pathogens, and should not be used as the sole basis for treatment or other patient management decisions. Negative results must be combined with clinical observations, patient history, and epidemiological information. The expected result is Negative. Fact Sheet for Patients: SugarRoll.be Fact Sheet for Healthcare Providers: https://www.woods-mathews.com/ This test is not yet approved  or cleared by the [place] and  has been authorized for detection and/or diagnosis of SARS-CoV-2 by FDA under an Emergency Use Authorization (EUA). This EUA will remain  in effect (meaning this test can be used) for the duration of the COVID-19 declaration under Section 56 4(b)(1) of the Act, 21 U.S.C. section 360bbb-3(b)(1), unless the authorization is terminated or revoked sooner. Performed at [facility], [place] [address] [place], [place] [address]   Surgical pcr screen     Status: None   Collection Time: 05/27/19 10:30 PM   Specimen: Nasal Mucosa; Nasal Swab    Result Value Ref Range Status   MRSA, PCR NEGATIVE NEGATIVE Final   Staphylococcus aureus NEGATIVE NEGATIVE Final    Comment: (NOTE) The Xpert SA Assay (FDA approved for NASAL specimens in patients 6 years of age and older), is one component of a comprehensive surveillance program. It is not intended to diagnose infection nor to guide or monitor treatment. Performed at [facility], [address] [place], [place] [address]          Radiology Studies: DG HIP OPERATIVE UNILAT W OR W/O PELVIS LEFT  Result Date: 05/28/2019 CLINICAL DATA:  Left femoral neck fracture EXAM: OPERATIVE LEFT HIP WITH PELVIS COMPARISON:  05/27/2019 FLUOROSCOPY TIME:  Radiation Exposure Index (as provided by the fluoroscopic device): 1.5 mGy If the device does not provide the exposure index: Fluoroscopy Time:  6 seconds Number of Acquired Images:  2 FINDINGS: Left hip prosthesis is noted in satisfactory position. IMPRESSION: Status post left hip replacement Electronically Signed   By: Inez Catalina M.D.   On: 05/28/2019 15:35   DG HIP UNILAT W OR W/O PELVIS 2-3 VIEWS LEFT  Result Date: 05/28/2019 CLINICAL DATA:  Postoperative for left total hip prosthesis placement EXAM: DG HIP (WITH OR WITHOUT PELVIS) 2-3V LEFT COMPARISON:  05/28/2019 intraoperative radiographs, and preoperative assessment of 05/27/2019 FINDINGS: Frontal and cross-table lateral images of the left total hip prosthesis demonstrate expected orientation and appearance without periprosthetic fracture or acute complicating feature. Bony demineralization is present. Atherosclerotic vascular calcifications are also noted. Small amount of gas present in the lateral soft tissues along with skin staples. IMPRESSION: Left total hip prosthesis in place without complicating feature observed. Electronically Signed   By: Van Clines M.D.   On: 05/28/2019 16:15        Scheduled Meds: . acetaminophen  650 mg Oral Q6H  . docusate sodium  100  mg Oral BID  . enoxaparin (LOVENOX) injection  40 mg Subcutaneous Q24H   Continuous Infusions: . magnesium sulfate bolus IVPB       LOS: 3 days    Time spent: 15 minutes    Ezekiel Slocumb, DO [facility]   If 7PM-7AM, please contact night-coverage [email or web address] 05/30/2019, 7:28 AM

## 2019-05-30 NOTE — Progress Notes (Signed)
End of Shift Summary:  Date: 05/30/19 Shift: 0700-1900 Ambulatory: up x2; in chair for several hours this afternoon.  Significant Events: No significant events today. VSS; patient's daughter at bedside through visiting hours. Patient A&Ox4 and alert for majority of the day. Pain well controlled with tylenol.

## 2019-05-31 MED ORDER — BUPIVACAINE HCL (PF) 0.5 % IJ SOLN
10.0000 mL | Freq: Once | INTRAMUSCULAR | Status: DC
  Filled 2019-05-31: qty 10

## 2019-05-31 MED ORDER — MAGNESIUM SULFATE 2 GM/50ML IV SOLN
2.0000 g | Freq: Once | INTRAVENOUS | Status: AC
  Administered 2019-05-31: 2 g via INTRAVENOUS
  Filled 2019-05-31: qty 50

## 2019-05-31 MED ORDER — TRIAMCINOLONE ACETONIDE 40 MG/ML IJ SUSP
40.0000 mg | Freq: Once | INTRAMUSCULAR | Status: DC
  Filled 2019-05-31: qty 1

## 2019-05-31 MED ORDER — POTASSIUM CHLORIDE CRYS ER 20 MEQ PO TBCR
40.0000 meq | EXTENDED_RELEASE_TABLET | Freq: Once | ORAL | Status: AC
  Administered 2019-05-31: 40 meq via ORAL
  Filled 2019-05-31: qty 2

## 2019-05-31 NOTE — Progress Notes (Signed)
Physical Therapy Treatment Patient Details Name: Gerald [name] MRN: [id number] DOB: 1920/09/17 Today's Date: 05/31/2019    History of Present Illness Pt. is a 86 y.o. male who was admitted to [name] with left hip pain. Pt. sustained a Displaced Left Femoral Neck Fracture. Pt. underwent at Hemiarthrplasty repair. PMHx includes: chronic anemia secondary to CKD and myelodysplastic syndrome followed by hematology, A. fib, hypertension, prostate cancer, testicular cancer, and hemochromatosis.    PT Comments    Daughter in for session.  Pre-medicated 30 minutes before session.  Participated in exercises as described below.  To EOB with mod a x 1.  Stood with min/mod a x 2 and is able to walk 5' forward with RW and min/mod a x 2 for safety.  Forward lean with short shuffling steps.  Remained in recliner.  Discussed at length with daughter regarding discharge plan.  They wish to take pt home despite assist levels.  He has no equipment at home.  Will need commode, wheelchair, walker (will purchase on their own), HHPT and EMS transfer home.  He is unable to walk household distances at this time and will require a wheelchair for safe mobility in and out of the home.  Daughter is aware that he will need +2 assist initially upon returning home and is aware that we cannot say how long he will ned +2 assist.  She voices concerns over no visitors at [facility].  While he remains appropriate for SNF, family is prepared to care for him.  Will update discharge recommendations to assist in discharge planning.    Patient suffers from hip fracture which impairs his/her ability to perform daily activities like toileting, feeding, dressing, grooming, bathing in the home. A cane, walker, crutch will not resolve the patient's issue with performing activities of daily living. A lightweight wheelchair is required/recommended and will allow patient to safely perform daily activities.   Patient can safely propel the wheelchair in  the home or has a caregiver who can provide assistance.    Follow Up Recommendations  Other (comment);Home health PT;Supervision for mobility/OOB;Supervision/Assistance - 24 hour     Equipment Recommendations  Rolling walker with 5" wheels;3in1 (PT);Wheelchair (measurements PT)    Recommendations for Other Services       Precautions / Restrictions Precautions Precautions: Anterior Hip Restrictions Weight Bearing Restrictions: Yes LLE Weight Bearing: Weight bearing as tolerated    Mobility  Bed Mobility Overal bed mobility: Needs Assistance Bed Mobility: Supine to Sit     Supine to sit: Mod assist;HOB elevated        Transfers Overall transfer level: Needs assistance Equipment used: Rolling walker (2 wheeled) Transfers: Sit to/from Stand Sit to Stand: Mod assist;+2 physical assistance            Ambulation/Gait Ambulation/Gait assistance: Mod assist;+2 physical assistance Gait Distance (Feet): 5 Feet Assistive device: Rolling walker (2 wheeled) Gait Pattern/deviations: Step-to pattern;Decreased step length - right;Decreased step length - left;Trunk flexed Gait velocity: decreased   General Gait Details: able to take several difficult steps forward this session - requries +2 assist at all times and remains quite limited.   Stairs             Wheelchair Mobility    Modified Rankin (Stroke Patients Only)       Balance Overall balance assessment: Needs assistance Sitting-balance support: No upper extremity supported;Feet supported Sitting balance-Leahy Scale: Good     Standing balance support: Bilateral upper extremity supported Standing balance-Leahy Scale: Fair Standing balance comment: Heavy  reliance on UEs with RW;                            Cognition Arousal/Alertness: Awake/alert Behavior During Therapy: WFL for tasks assessed/performed Overall Cognitive Status: Within Functional Limits for tasks assessed                                         Exercises Other Exercises Other Exercises: LLE AAROM- ankle pumps, heel slides, ab/add and quad sets x 10    General Comments        Pertinent Vitals/Pain Pain Assessment: Faces Faces Pain Scale: Hurts little more Pain Location: Left hip with movement Pain Descriptors / Indicators: Aching;Sore Pain Intervention(s): Monitored during session;Premedicated before session;Limited activity within patient's tolerance    Home Living                      Prior Function            PT Goals (current goals can now be found in the care plan section) Progress towards PT goals: Progressing toward goals    Frequency    BID      PT Plan Discharge plan needs to be updated    Co-evaluation              AM-PAC PT "6 Clicks" Mobility   Outcome Measure  Help needed turning from your back to your side while in a flat bed without using bedrails?: A Little Help needed moving from lying on your back to sitting on the side of a flat bed without using bedrails?: A Lot Help needed moving to and from a bed to a chair (including a wheelchair)?: A Lot Help needed standing up from a chair using your arms (e.g., wheelchair or bedside chair)?: A Lot Help needed to walk in hospital room?: A Lot Help needed climbing 3-5 steps with a railing? : Total 6 Click Score: 12    End of Session Equipment Utilized During Treatment: Gait belt Activity Tolerance: Patient tolerated treatment well;Patient limited by fatigue Patient left: in chair;with call bell/phone within reach;with chair alarm set;with family/visitor present Nurse Communication: Mobility status Pain - Right/Left: Left Pain - part of body: Hip     Time: 1047-1110 PT Time Calculation (min) (ACUTE ONLY): 23 min  Charges:  $Gait Training: 8-22 mins $Therapeutic Exercise: 8-22 mins                     Chesley Noon, PTA 05/31/19, 12:49 PM

## 2019-05-31 NOTE — Progress Notes (Signed)
Patient suffers from femoral Fracture, weakness which impairs their ability to perform daily activities like ADLs in the home.  A walking aid will not resolve  issue with performing activities of daily living. A wheelchair will allow patient to safely perform daily activities. Patient is not able to propel themselves in the home using a standard weight wheelchair due to weakness. Patient can self propel in the lightweight wheelchair. Length of need 99 months. Accessories: elevating leg rests ELRs, wheel locks, extensions and anti-tippers, back cushion.

## 2019-05-31 NOTE — Progress Notes (Signed)
Physical Therapy Treatment Patient Details Name: Gerald [name] MRN: [id number] DOB: 1920/08/13 Today's Date: 05/31/2019    History of Present Illness Pt. is a 86 y.o. male who was admitted to [facility] with left hip pain. Pt. sustained a Displaced Left Femoral Neck Fracture. Pt. underwent at Hemiarthrplasty repair. PMHx includes: chronic anemia secondary to CKD and myelodysplastic syndrome followed by hematology, A. fib, hypertension, prostate cancer, testicular cancer, and hemochromatosis.    PT Comments    Pt in chair x 2 hours today.  Fatigued needing to return to bed.  Session focused on education with daughter in regards to mobility at home.  Given and educated on use of transfer safety belt and it's proper use.  Donned belt and daughter assisted with attempt at transfer back to bed.  Stood with mod ax 2 and had increased difficulty stepping towards bed.  After several small steps, he was guided back to recliner.  Stand pivot transfer back to bed with max a x 1.  Returned to supine with general fatigue noted.  Discussed at length discharge plan with daughter.  She remains firm in her decision to take him home despite initial recommendations for SNF.  Discussed mobility and general safety and home and +2 assist at all times for attempts.  Encouraged sitting EOB daily several times and attempts at standing with +2 assist.  If she does not feel comfortable transferring at the time then to just stand as tolerated and allow HHPT to progress mobility as tolerated.  If his is able to transfer, encouraged her to limit OOB time to not fatigue him to allow for easier transfers.  Voiced understanding.  Will continue tomorrow with emphasis on mobility education and safety with daughter (who arrives after 10:00) in preparation for discharge home.   Follow Up Recommendations  Other (comment);Home health PT;Supervision for mobility/OOB;Supervision/Assistance - 24 hour     Equipment Recommendations   Rolling walker with 5" wheels;3in1 (PT);Wheelchair (measurements PT)    Recommendations for Other Services       Precautions / Restrictions Precautions Precautions: Anterior Hip Restrictions Weight Bearing Restrictions: Yes LLE Weight Bearing: Weight bearing as tolerated    Mobility  Bed Mobility Overal bed mobility: Needs Assistance Bed Mobility: Sit to Supine     Supine to sit: Mod assist;HOB elevated Sit to supine: Mod assist      Transfers Overall transfer level: Needs assistance Equipment used: Rolling walker (2 wheeled);None Transfers: Sit to/from [facility] Sit to Stand: Mod assist;+2 physical assistance Stand pivot transfers: Max assist;+2 physical assistance       General transfer comment: difficult transfer back to chair with walker.  Pt returned to sitting after unsucessful attempt and stnd pivot back to bed performed  Ambulation/Gait Ambulation/Gait assistance: Mod assist;+2 physical assistance Gait Distance (Feet): 5 Feet Assistive device: Rolling walker (2 wheeled) Gait Pattern/deviations: Step-to pattern;Decreased step length - right;Decreased step length - left;Trunk flexed Gait velocity: decreased   General Gait Details: able to take several difficult steps forward this session - requries +2 assist at all times and remains quite limited.   Stairs             Wheelchair Mobility    Modified Rankin (Stroke Patients Only)       Balance Overall balance assessment: Needs assistance Sitting-balance support: No upper extremity supported;Feet supported Sitting balance-Leahy Scale: Good     Standing balance support: Bilateral upper extremity supported Standing balance-Leahy Scale: Fair Standing balance comment: Heavy reliance on UEs with RW;  Cognition Arousal/Alertness: Awake/alert Behavior During Therapy: WFL for tasks assessed/performed Overall Cognitive Status: Within Functional  Limits for tasks assessed                                        Exercises Other Exercises Other Exercises: LLE AAROM- ankle pumps, heel slides, ab/add and quad sets x 10    General Comments        Pertinent Vitals/Pain Pain Assessment: Faces Faces Pain Scale: Hurts even more Pain Location: Left hip with movement Pain Descriptors / Indicators: Aching;Sore Pain Intervention(s): Limited activity within patient's tolerance;Monitored during session;Repositioned    Home Living                      Prior Function            PT Goals (current goals can now be found in the care plan section) Progress towards PT goals: Progressing toward goals    Frequency    BID      PT Plan Current plan remains appropriate    Co-evaluation              AM-PAC PT "6 Clicks" Mobility   Outcome Measure  Help needed turning from your back to your side while in a flat bed without using bedrails?: A Little Help needed moving from lying on your back to sitting on the side of a flat bed without using bedrails?: A Lot Help needed moving to and from a bed to a chair (including a wheelchair)?: A Lot Help needed standing up from a chair using your arms (e.g., wheelchair or bedside chair)?: A Lot Help needed to walk in hospital room?: A Lot Help needed climbing 3-5 steps with a railing? : Total 6 Click Score: 12    End of Session Equipment Utilized During Treatment: Gait belt Activity Tolerance: Patient limited by fatigue Patient left: in bed;with call bell/phone within reach;with bed alarm set;with family/visitor present Nurse Communication: Mobility status Pain - Right/Left: Left Pain - part of body: Hip     Time: [id number] PT Time Calculation (min) (ACUTE ONLY): 23 min  Charges:  $Gait Training: 8-22 mins $Therapeutic Exercise: 8-22 mins $Therapeutic Activity: 23-37 mins                    Chesley Noon, PTA 05/31/19, 1:58 PM

## 2019-05-31 NOTE — Care Management Important Message (Signed)
Important Message  Patient Details  Name: Gerald [name] MRN: [id number] Date of Birth: May 02, 1920   Medicare Important Message Given:  Yes     Juliann Pulse A Lubna Stegeman 05/31/2019, 11:32 AM

## 2019-05-31 NOTE — Op Note (Signed)
05/28/2019  11:32 AM  PATIENT:  Gerald [name]  86 y.o. male  PRE-OPERATIVE DIAGNOSIS:  Left knee arthritis  POST-OPERATIVE DIAGNOSIS:  Left knee arthritis  PROCEDURE:   SURGEON: Laurene Footman, MD  ASSISTANTS: None  ANESTHESIA:   local  EBL:  No intake/output data recorded.  BLOOD ADMINISTERED:none  DRAINS: none   LOCAL MEDICATIONS USED:  MARCAINE     SPECIMEN:  No Specimen  DISPOSITION OF SPECIMEN:  N/A  COUNTS:  NO Bedside procedure, no count done  TOURNIQUET:  * No tourniquets in log *  IMPLANTS: None  DICTATION: .Dragon Dictation patient had appropriate identification and timeout procedure.  The left inferior lateral knee was prepped with ChloraPrep.  22-gauge needle was inserted and 1 cc of Kenalog 40 mg/cc, 3 cc half percent Sensorcaine plain was then infiltrated into the joint.  Band-Aid was applied after removing needle.  There is no blood loss no complications and no specimen  PLAN OF CARE: Continue as inpatient

## 2019-05-31 NOTE — TOC Initial Note (Addendum)
Transition of Care [facility]) - Initial/Assessment Note    Patient Details  Name: Gerald [name] MRN: [id number] Date of Birth: November 20, 1920  Transition of Care [facility] CM/SW Contact:    Su Hilt, RN Phone Number: 05/31/2019, 2:17 PM  Clinical Narrative:                 Met with the patient and his daughter in the room, he lives at home with the daughter They refuse for him to go to SNF She will take the patient home with her, she provides care and transportation, he will need a WC, and a 3 in 1, they also need All services for [facility] PT, OT, RN, Aide, Set up with [facility]    Expected Discharge Plan: [place] Barriers to Discharge: Continued Medical Work up   Patient Goals and CMS Choice Patient states their goals for this hospitalization and ongoing recovery are:: go home      Expected Discharge Plan and Services Expected Discharge Plan: [place]   Discharge Planning Services: CM Consult   Living arrangements for the past 2 months: Single Family Home                 DME Arranged: 3-N-1, Wheelchair manual DME Agency: [employer] Date DME Agency Contacted: 05/31/19 Time DME Agency Contacted: 87 Representative spoke with at DME Agency: [place] Arranged: RN, PT, OT, Nurse's Aide [place] Agency: [facility] (formerly [employer]) Date [place]: 05/31/19 Time [place]: 19 Representative spoke with at [place]: [place] Arrangements/Services Living arrangements for the past 2 months: [place] Lives with:: Adult Children Patient language and need for interpreter reviewed:: Yes Do you feel safe going back to the place where you live?: Yes      Need for Family Participation in Patient Care: Yes (Comment) Care giver support system in place?: Yes (comment) Current home services: DME(shower chair) Criminal Activity/Legal Involvement Pertinent to Current Situation/Hospitalization: No  - Comment as needed  Activities of Daily Living Home Assistive Devices/Equipment: Environmental consultant (specify type), Cane (specify quad or straight) ADL Screening (condition at time of admission) Patient's cognitive ability adequate to safely complete daily activities?: Yes Is the patient deaf or have difficulty hearing?: No Does the patient have difficulty seeing, even when wearing glasses/contacts?: No Does the patient have difficulty concentrating, remembering, or making decisions?: No Patient able to express need for assistance with ADLs?: Yes Does the patient have difficulty dressing or bathing?: No Independently performs ADLs?: Yes (appropriate for developmental age) Does the patient have difficulty walking or climbing stairs?: No Weakness of Legs: None Weakness of Arms/Hands: None  Permission Sought/Granted   Permission granted to share information with : Yes, Verbal Permission Granted              Emotional Assessment Appearance:: Appears stated age Attitude/Demeanor/Rapport: Engaged(engaged) Affect (typically observed): Appropriate Orientation: : Oriented to Self, Oriented to Place, Oriented to  Time, Oriented to Situation Alcohol / Substance Use: Not Applicable Psych Involvement: No (comment)  Admission diagnosis:  Fall [W19.XXXA] Left displaced femoral neck fracture ([place]) [S72.002A] Closed left hip fracture, initial encounter [facility]) [S72.002A] Patient Active Problem List   Diagnosis Date Noted  . Leukocytosis 05/28/2019  . Left displaced femoral neck fracture ([place]) 05/27/2019  . MDS (myelodysplastic syndrome), low grade ([place]) 10/18/2016  . Anemia of chronic disease 10/21/2015  . Hemochromatosis 10/25/2014  . BP (high blood pressure) 10/21/2014  . CA  of prostate ([place]) 10/21/2014  . Cancer of testicle ([place]) 10/21/2014  . Anemia 08/24/2014  . Dizziness 09/14/2013  . Difficulty in walking 09/14/2013  . 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 A-fib ([place]) 06/24/2013  . MI (mitral  incompetence) 06/24/2013  . Absolute anemia 06/23/2013  . Arthritis, degenerative 06/23/2013  . Essential (primary) hypertension 06/23/2013   PCP:  [name], MD Pharmacy:   [facility] [address] [place] - [place] [address] [place] [address] Phone: [phone] Fax: [phone]  [id number] [place] [id number]: [phone]: [phone]    Social Determinants of Health (SDOH) Interventions    Readmission Risk Interventions No flowsheet data found.

## 2019-05-31 NOTE — Progress Notes (Addendum)
  Subjective: 3 Days Post-Op Procedure(s) (LRB): ANTERIOR APPROACH HEMI HIP ARTHROPLASTY (Left) Patient reports pain as mild this morning. Dr. Rudene Christians planning to perform a steroid injection today at lunch. Patient is well this morning. Current plan is for d/c to SNF when appropriate, family would like to take patient home if possible but current recommendation is for SNF. Negative for chest pain and shortness of breath Fever: no Gastrointestinal:Negative for nausea and vomiting  Objective: Vital signs in last 24 hours: Temp:  [98 F (36.7 C)-98.4 F (36.9 C)] 98.4 F (36.9 C) (03/15 0027) Pulse Rate:  [91-96] 91 (03/15 0027) Resp:  [17-18] 17 (03/15 0027) BP: (136-147)/(70-78) 147/72 (03/15 0027) SpO2:  [94 %-95 %] 95 % (03/15 0027)  Intake/Output from previous day:  Intake/Output Summary (Last 24 hours) at 05/31/2019 0733 Last data filed at 05/31/2019 0500 Gross per 24 hour  Intake 2319.3 ml  Output 500 ml  Net 1819.3 ml    Intake/Output this shift: No intake/output data recorded.  Labs: Recent Labs    05/28/19 1739 05/29/19 0427 05/30/19 0549  HGB 9.7* 8.2* 7.9*   Recent Labs    05/29/19 0427 05/30/19 0549  WBC 13.9* 11.0*  RBC 2.47* 2.44*  HCT 23.2* 23.1*  PLT 326 317   Recent Labs    05/29/19 0427 05/30/19 0549  NA 142 141  K 3.7 3.6  CL 114* 113*  CO2 20* 20*  BUN 27* 28*  CREATININE 1.11 0.98  GLUCOSE 134* 116*  CALCIUM 7.9* 8.0*   No results for input(s): LABPT, INR in the last 72 hours.   EXAM General - Patient is Alert but does not seem oriented. Extremity - ABD soft Neurovascular intact Sensation intact distally Intact pulses distally Dorsiflexion/Plantar flexion intact Incision: dressing C/D/I No cellulitis present Dressing/Incision - clean, dry, no drainage Motor Function - intact, moving foot and toes well on exam.   Past Medical History:  Diagnosis Date  . Anemia   . Atrial fibrillation ([place])   . Cancer [facility])    Testicular   . Hemochromatosis 10/25/2014  . Hx of basal cell carcinoma 2008   R. ant. zygomatic/sideburn 2008, L preauricular 2019  . Hypertension   . Prostate cancer ([place])     Assessment/Plan: 3 Days Post-Op Procedure(s) (LRB): ANTERIOR APPROACH HEMI HIP ARTHROPLASTY (Left) Principal Problem:   Left displaced femoral neck fracture ([facility]) Active Problems:   A-fib (HCC)   Essential (primary) hypertension   Hemochromatosis   Anemia of chronic disease   MDS (myelodysplastic syndrome), low grade (HCC)   Leukocytosis  Estimated body mass index is 21.49 kg/m as calculated from the following:   Height as of this encounter: 5\' 9"  (1.753 m).   Weight as of this encounter: 66 kg. Advance diet Up with therapy  Patient has had a BM. Work with PT today. Possible d/c to SNF today pending PT. Dr. Rudene Christians to perform a left knee steroid injection today at lunch.  Upon discharge from hospital, follow-up with [place] in 10-14 days for staple removal. Continue Lovenox 40mg  daily for 14 days.  DVT Prophylaxis - Lovenox, Foot Pumps and TED hose Weight-Bearing as tolerated to left leg  J. Cameron Proud, PA-C [facility] Orthopaedic Surgery 05/31/2019, 7:33 AM

## 2019-05-31 NOTE — Progress Notes (Addendum)
PROGRESS NOTE    Gerald [name]  [id number] DOB: [birth year] DOA: 05/27/2019  PCP: [name], MD    LOS - 4   Brief Narrative:  [name] a [id number] 86 y.o.malewith medical history significant ofchronic anemia secondary to CKD and myelodysplastic syndrome followed by hematology, A. fib, hypertension, prostate cancer, testicular cancer, hemochromatosis presented to the ED today with left hip pain after mechanical fall at home.Found to have left femoral neck fracture on xray in the ED. Admitted to hospitalist service with orthopedics consult. Plan is for surgery 3/12 at 1 pm. Labs notable for mild leukocytosis, likely reactive in setting of acute injury, and anemia slightly worse than baseline.Underwent left hip hemiarthroplasty with Dr. Rudene Christians on 3/12.  Subjective 3/15: Patient seen this morning with daughter at bedside.  He denies pain, but had some earlier that improved with Tylenol.  Did get up in chair for several hours yesterday.  Daughter stating to prefer to take patient home instead of going to rehab.  Patient denies fevers or chills, chest pain or shortness of breath, nausea vomiting diarrhea.  States he is having bowel movement.  Assessment & Plan:   Principal Problem:   Left displaced femoral neck fracture (HCC) Active Problems:   A-fib (HCC)   Essential (primary) hypertension   Anemia of chronic disease   Leukocytosis   Hemochromatosis   MDS (myelodysplastic syndrome), low grade (HCC)    Left femoral neck fracture- secondary to mechanical fall. Status post left hip hemiarthroplasty on 3/21. --ortho consulted, Dr. Rudene Christians --pain control with scheduled Tylenol, PRN tramadol or oxycodone, IV morphine for breakthrough pain --bowel regimen --PT recommends SNF --please ensure pain controlled before PT sessions so patient able to tolerate --Weightbearing as tolerated on left lower extremity --DVT prophylaxis with Lovenox x14 days, SCDs and TED  hose --Follow-up with [facility] in 10 to 14 days for staple removal  ParoxysmalA-fib ([place])- rate controlled. Not on anticoagulation or rate control meds. Sinus rhythm with ectopy on ECG. --telemetry monitoring --Maintain K> 4.0 and Mg> 2.0  Leukocytosis- likely reactive in setting of acute fracture. No evidence of infection. --monitor for signs/symptoms of infection --CBC's daily  QTc Prolongation - QTc 573 on ECG at time of admission --telemetry monitoring --avoid QT prolonging agents as much as possible --maintain K>4 and Mg>2 --serial ECG's to monitor  Osteoarthritis, left knee pain- left knee steroid injection performed by orthopedist on 3/15  Essential (primary) hypertension- chronic, stable. Takes lisinopril/HCTZ at home. --hold lisinopril/HCTZ for now --PRN oral hydralazine or IV labetalol  Anemia of chronic disease- no evidence of active bleeding. Hbg 7.9 on admission. --type and cross --monitor CBC --transfuse if Hbg < 7  Hemochromatosis MDS (myelodysplastic syndrome), low grade (HCC) --follows with heme/onc for both, and anemia --monitor CBC and renal function   DVT prophylaxis:Lovenox, SCDs, TED hose Code Status: Full Code Family Communication:Daughter [name] at bedside during encounter, updated and questions answered  Disposition Plan:Discharge to SNF versus home with home health PT pending SNF bed or arrangement of home health services in necessary DME.  Current therapy recommendation is SNF.  Family wants to take patient home. Coming Fromhome Exp DC Date3/1 Barriersas above Medically Stable for Discharge?Yes  Consultants:  Orthopedics  Procedures:  3/21-left hip hemiarthroplasty  Antimicrobials:  None    Objective: Vitals:   05/30/19 0855 05/30/19 1626 05/31/19 0027 05/31/19 1100  BP: 136/70 (!) 144/78 (!) 147/72 140/70  Pulse: 96 93 91 90  Resp: 18 18 17 17   Temp: 98 F (36.7  C) 98.4 F (36.9  C) 98.4 F (36.9 C) 98.4 F (36.9 C)  TempSrc: Oral Oral Oral Oral  SpO2: 94% 95% 95% 95%  Weight:      Height:        Intake/Output Summary (Last 24 hours) at 05/31/2019 1242 Last data filed at 05/31/2019 0500 Gross per 24 hour  Intake 640 ml  Output 500 ml  Net 140 ml   Filed Weights   05/27/19 1428 05/28/19 1244  Weight: 66.7 kg 66 kg    Examination:  General exam: awake, alert, no acute distress HEENT: atraumatic, clear conjunctiva, anicteric sclera, moist mucus membranes, hearing grossly normal  Respiratory system: CTAB, no wheezes, rales or rhonchi, normal respiratory effort. Cardiovascular system: normal S1/S2, RRR, no pedal edema.   Gastrointestinal system: soft, NT, ND, no HSM felt, +bowel sounds. Central nervous system: A&O x3. no gross focal neurologic deficits, normal speech Extremities: moves all, no cyanosis, normal tone, left lower extremity distal sensation and pulses intact, left lateral hip with honeycomb dressing intact and no surrounding warmth erythema or induration appreciated Psychiatry: normal mood, congruent affect, judgement and insight appear normal    Data Reviewed: I have personally reviewed following labs and imaging studies  CBC: Recent Labs  Lab 05/27/19 1430 05/28/19 0521 05/28/19 1739 05/29/19 0427 05/30/19 0549  WBC 12.6* 11.4*  --  13.9* 11.0*  HGB 7.9* 7.8* 9.7* 8.2* 7.9*  HCT 23.6* 23.0* 28.7* 23.2* 23.1*  MCV 97.9 97.0  --  93.9 94.7  PLT 458* 354  --  326 [id number]   Basic Metabolic Panel: Recent Labs  Lab 05/27/19 1430 05/28/19 0521 05/28/19 1739 05/29/19 0427 05/30/19 0549  NA 143 142  --  142 141  K 3.5 3.4*  --  3.7 3.6  CL 110 111  --  114* 113*  CO2 24 23  --  20* 20*  GLUCOSE 129* 137*  --  134* 116*  BUN 29* 27*  --  27* 28*  CREATININE 0.98 0.90 0.91 1.11 0.98  CALCIUM 8.9 8.7*  --  7.9* 8.0*  MG  --   --   --  1.8 1.7   GFR: Estimated Creatinine Clearance: 41.2 mL/min (by C-G formula based on SCr of 0.98  mg/dL). Liver Function Tests: No results for input(s): AST, ALT, ALKPHOS, BILITOT, PROT, ALBUMIN in the last 168 hours. No results for input(s): LIPASE, AMYLASE in the last 168 hours. No results for input(s): AMMONIA in the last 168 hours. Coagulation Profile: Recent Labs  Lab 05/27/19 1430  INR 1.3*   Cardiac Enzymes: No results for input(s): CKTOTAL, CKMB, CKMBINDEX, TROPONINI in the last 168 hours. BNP (last 3 results) No results for input(s): PROBNP in the last 8760 hours. HbA1C: No results for input(s): HGBA1C in the last 72 hours. CBG: No results for input(s): GLUCAP in the last 168 hours. Lipid Profile: No results for input(s): CHOL, HDL, LDLCALC, TRIG, CHOLHDL, LDLDIRECT in the last 72 hours. Thyroid Function Tests: No results for input(s): TSH, T4TOTAL, FREET4, T3FREE, THYROIDAB in the last 72 hours. Anemia Panel: No results for input(s): VITAMINB12, FOLATE, FERRITIN, TIBC, IRON, RETICCTPCT in the last 72 hours. Sepsis Labs: No results for input(s): PROCALCITON, LATICACIDVEN in the last 168 hours.  Recent Results (from the past 240 hour(s))  SARS CORONAVIRUS 2 (TAT 6-24 HRS) Nasopharyngeal Nasopharyngeal Swab     Status: None   Collection Time: 05/27/19  4:49 PM   Specimen: Nasopharyngeal Swab  Result Value Ref Range Status   SARS Coronavirus  2 NEGATIVE NEGATIVE Final    Comment: (NOTE) SARS-CoV-2 target nucleic acids are NOT DETECTED. The SARS-CoV-2 RNA is generally detectable in upper and lower respiratory specimens during the acute phase of infection. Negative results do not preclude SARS-CoV-2 infection, do not rule out co-infections with other pathogens, and should not be used as the sole basis for treatment or other patient management decisions. Negative results must be combined with clinical observations, patient history, and epidemiological information. The expected result is Negative. Fact Sheet for  Patients: SugarRoll.be Fact Sheet for Healthcare Providers: https://www.woods-mathews.com/ This test is not yet approved or cleared by the [place] FDA and  has been authorized for detection and/or diagnosis of SARS-CoV-2 by FDA under an Emergency Use Authorization (EUA). This EUA will remain  in effect (meaning this test can be used) for the duration of the COVID-19 declaration under Section 56 4(b)(1) of the Act, 21 U.S.C. section 360bbb-3(b)(1), unless the authorization is terminated or revoked sooner. Performed at [facility], [place] [address] [place], [place] [address]   Surgical pcr screen     Status: None   Collection Time: 05/27/19 10:30 PM   Specimen: Nasal Mucosa; Nasal Swab  Result Value Ref Range Status   MRSA, PCR NEGATIVE NEGATIVE Final   Staphylococcus aureus NEGATIVE NEGATIVE Final    Comment: (NOTE) The Xpert SA Assay (FDA approved for NASAL specimens in patients 59 years of age and older), is one component of a comprehensive surveillance program. It is not intended to diagnose infection nor to guide or monitor treatment. Performed at [facility], [address], [place],  [address]          Radiology Studies: DG Knee 1-2 Views Left  Result Date: 05/30/2019 CLINICAL DATA:  Left knee joint effusion, pain and swelling, fall prior to surgery EXAM: LEFT KNEE - 1-2 VIEW COMPARISON:  None. FINDINGS: No fracture or dislocation of the left knee. There is moderate to severe tricompartmental joint space narrowing and osteophytosis. Probable calcified loose bodies in the superior joint space. Large, nonspecific knee joint effusion. Vascular calcinosis. IMPRESSION: 1. No fracture or dislocation of the left knee. Moderate to severe tricompartmental osteoarthritis. 2. Large, nonspecific knee joint effusion. Probable calcified loose bodies in the superior joint space. Electronically Signed   By: Eddie Candle  M.D.   On: 05/30/2019 15:02        Scheduled Meds: . acetaminophen  650 mg Oral Q6H  . bupivacaine  10 mL Infiltration Once  . docusate sodium  100 mg Oral BID  . enoxaparin (LOVENOX) injection  40 mg Subcutaneous Q24H  . polyethylene glycol  17 g Oral Daily  . triamcinolone acetonide  40 mg Intra-articular Once   Continuous Infusions:   LOS: 4 days    Time spent: 30 minutes    Ezekiel Slocumb, DO [facility]   If 7PM-7AM, please contact night-coverage [email or web address] 05/31/2019, 12:42 PM

## 2019-06-01 LAB — CBC
HCT: 23.8 % — ABNORMAL LOW (ref 39.0–52.0)
Hemoglobin: 8 g/dL — ABNORMAL LOW (ref 13.0–17.0)
MCH: 32.7 pg (ref 26.0–34.0)
MCHC: 33.6 g/dL (ref 30.0–36.0)
MCV: 97.1 fL (ref 80.0–100.0)
Platelets: 364 10*3/uL (ref 150–400)
RBC: 2.45 MIL/uL — ABNORMAL LOW (ref 4.22–5.81)
RDW: 21.9 % — ABNORMAL HIGH (ref 11.5–15.5)
WBC: 9.1 10*3/uL (ref 4.0–10.5)
nRBC: 0.5 % — ABNORMAL HIGH (ref 0.0–0.2)

## 2019-06-01 LAB — SURGICAL PATHOLOGY

## 2019-06-01 MED ORDER — ACETAMINOPHEN 325 MG PO TABS: 650.0000 mg | ORAL_TABLET | Freq: Four times a day (QID) | ORAL | Status: DC

## 2019-06-01 NOTE — TOC Transition Note (Signed)
Transition of Care [facility]) - CM/SW Discharge Note   Patient Details  Name: Gerald [name] MRN: [id number] Date of Birth: 10-13-1920  [facility] CM/SW Contact:  Su Hilt, RN Phone Number: 06/01/2019, 12:16 PM   Clinical Narrative:    The Patient is scheduled to DC home torday via EMS transport, [facility], The bedside nurse is aware of EMS being called and is ready to DC, the daughter is in the room and is aware as well, [employer] will deliver the DME today to the home   Final next level of care: [place] Barriers to Discharge: Barriers Resolved   Patient Goals and CMS Choice Patient states their goals for this hospitalization and ongoing recovery are:: go home      Discharge Placement                       Discharge Plan and Services   Discharge Planning Services: CM Consult            DME Arranged: 3-N-1, Wheelchair manual DME Agency: [employer] Date DME Agency Contacted: 05/31/19 Time DME Agency Contacted: 28 Representative spoke with at DME Agency: [place]: RN, PT, OT, Nurse's Aide [place] Agency: [facility] [employer] Date [place]: 05/31/19 Time [place]: [place] Representative spoke with at [place]: [place] ([place]) Interventions     Readmission Risk Interventions No flowsheet data found.

## 2019-06-01 NOTE — Progress Notes (Signed)
Pt discharged per MD order. IV removed. Discharge instructions reviewed with pt and his daughter. All questions answered to pt and daughter satisfaction. Pt transported home via EMS.

## 2019-06-01 NOTE — Progress Notes (Signed)
New referral for [facility] Program to follow at home received from [facility]. Patient information given to referral. Thank you for this referral. Flo Shanks BSN, RN, [place] [phone]

## 2019-06-01 NOTE — TOC Progression Note (Signed)
Transition of Care [facility]) - Progression Note    Patient Details  Name: Gerald [name] MRN: [id number] Date of Birth: 12/06/1920  Transition of Care [facility]) CM/SW [place], RN Phone Number: 06/01/2019, 10:17 AM  Clinical Narrative:    Lilyan Gilford with [employer] to confirm when the DME would be delivered to the home, he stated that it is supposed to be delivered today.  I notified the physician   Expected Discharge Plan: [place] Barriers to Discharge: Continued Medical Work up  Expected Discharge Plan and Services Expected Discharge Plan: [place]   Discharge Planning Services: CM Consult   Living arrangements for the past 2 months: Single Family Home                 DME Arranged: 3-N-1, Wheelchair manual DME Agency: [employer] Date DME Agency Contacted: 05/31/19 Time DME Agency Contacted: [phone] Representative spoke with at DME Agency: [place]: RN, PT, OT, Nurse's Aide [place] Agency: [facility] [employer] Date [place]: 05/31/19 Time [place]: 1415 Representative spoke with at [place]: [place] ([place]) Interventions    Readmission Risk Interventions No flowsheet data found.

## 2019-06-01 NOTE — Progress Notes (Signed)
Physical Therapy Treatment Patient Details Name: Gerald [name] MRN: [id number] DOB: Feb 25, 1921 Today's Date: 06/01/2019    History of Present Illness Pt. is a 86 y.o. male who was admitted to [facility] with left hip pain. Pt. sustained a Displaced Left Femoral Neck Fracture. Pt. underwent at Hemiarthrplasty repair. PMHx includes: chronic anemia secondary to CKD and myelodysplastic syndrome followed by hematology, A. fib, hypertension, prostate cancer, testicular cancer, and hemochromatosis.    PT Comments    Pt was supine in bed upon arriving. He is alert and motivated throughout. Pt with cognition deficits however was able to follow commands consistantly and reports decreased pain from previous date. Pt's daughter present throughout session. Pt is planning to D/C home later this date with continued PT with HHPT. Pt required overall less assistance with all mobility, transfers, and gait. Gait belt previously issued to family to use at home when D/C. He was able to exit R side of bed and get on/off BSC. Only required +1 assist this date versus previously requiring +2 with all mobility. Pt is progressing well. PT recommends pt D/C home with EMS and continue rehab with HHPT per family request. Pt tolerated there ex list below. Pt was repositioned in bed post session with call bell in reach and daughter present.     Follow Up Recommendations  Home health PT;Supervision/Assistance - 24 hour;Supervision for mobility/OOB     Equipment Recommendations  Other (comment)(equipment scheduled to be delivered today)    Recommendations for Other Services       Precautions / Restrictions Precautions Precautions: Anterior Hip Precaution Booklet Issued: Yes (comment) Restrictions Weight Bearing Restrictions: Yes LLE Weight Bearing: Weight bearing as tolerated    Mobility  Bed Mobility Overal bed mobility: Needs Assistance Bed Mobility: Supine to Sit     Supine to sit: Mod assist;HOB  elevated Sit to supine: Min assist   General bed mobility comments: pt demonstrated improved ability to exit and re-enter bed. Increased time to perform. pt tolerated well  Transfers Overall transfer level: Needs assistance Equipment used: Rolling walker (2 wheeled);None Transfers: Sit to/from Stand Sit to Stand: Mod assist;From elevated surface(of one)         General transfer comment: Pt was able to STS 2 x during session. Once from EOB and once from [facility]. Pt demonstarted much improved abiilities this date and per daughter, pt has alot less pain today. Only required mod assist of one this date with vcs throughout for technique, sequencing, and safety.  Ambulation/Gait Ambulation/Gait assistance: Min assist Gait Distance (Feet): 3 Feet Assistive device: Rolling walker (2 wheeled) Gait Pattern/deviations: Step-to pattern;Decreased step length - right;Decreased step length - left;Trunk flexed Gait velocity: decreased   General Gait Details: pt was able to ambulate from EOB to [facility] and return with min assist once standing. Gait belt for safety. Pt tolerated well and per daughter is much improved from previous date.   Stairs             Wheelchair Mobility    Modified Rankin (Stroke Patients Only)       Balance Overall balance assessment: Needs assistance Sitting-balance support: No upper extremity supported;Feet supported Sitting balance-Leahy Scale: Good Sitting balance - Comments: pt has no LOB EOB or on BSC   Standing balance support: Bilateral upper extremity supported Standing balance-Leahy Scale: Fair Standing balance comment: Heavy reliance on UEs with RW;  Cognition Arousal/Alertness: Awake/alert Behavior During Therapy: WFL for tasks assessed/performed Overall Cognitive Status: Within Functional Limits for tasks assessed                                 General Comments: Pt was able to follow commands  well and was cooperative and motivated throughout.      Exercises General Exercises - Lower Extremity Ankle Circles/Pumps: AROM;Left;10 reps Quad Sets: AROM;Left;10 reps Gluteal Sets: AROM;10 reps Short Arc Quad: AROM;Left;10 reps Heel Slides: AROM;Left;10 reps Hip ABduction/ADduction: AROM;Left;10 reps    General Comments        Pertinent Vitals/Pain Pain Assessment: 0-10 Pain Score: 3  Faces Pain Scale: Hurts little more Pain Location: Left hip with movement Pain Descriptors / Indicators: Aching;Sore Pain Intervention(s): Limited activity within patient's tolerance;Monitored during session;Premedicated before session    Home Living                      Prior Function            PT Goals (current goals can now be found in the care plan section) Acute Rehab PT Goals Patient Stated Goal: to go back home and walk around again Progress towards PT goals: Progressing toward goals    Frequency    BID      PT Plan Current plan remains appropriate    Co-evaluation              AM-PAC PT "6 Clicks" Mobility   Outcome Measure  Help needed turning from your back to your side while in a flat bed without using bedrails?: A Little Help needed moving from lying on your back to sitting on the side of a flat bed without using bedrails?: A Lot Help needed moving to and from a bed to a chair (including a wheelchair)?: A Lot Help needed standing up from a chair using your arms (e.g., wheelchair or bedside chair)?: A Lot Help needed to walk in hospital room?: A Lot Help needed climbing 3-5 steps with a railing? : Total 6 Click Score: 12    End of Session Equipment Utilized During Treatment: Gait belt Activity Tolerance: Patient tolerated treatment well Patient left: in bed;with call bell/phone within reach;with bed alarm set;with family/visitor present Nurse Communication: Mobility status PT Visit Diagnosis: Unsteadiness on feet (R26.81);Muscle weakness  (generalized) (M62.81);Difficulty in walking, not elsewhere classified (R26.2);Pain Pain - Right/Left: Left Pain - part of body: Hip     Time: [id number] PT Time Calculation (min) (ACUTE ONLY): 26 min  Charges:  $Therapeutic Exercise: 8-22 mins $Therapeutic Activity: 8-22 mins                     Julaine Fusi PTA 06/01/19, 11:10 AM

## 2019-06-01 NOTE — Discharge Summary (Signed)
Physician Discharge Summary  EARNST COONTS [id number] DOB: Jul 14, 1920 DOA: 05/27/2019  PCP: [name], MD  Admit date: 05/27/2019 Discharge date: 06/01/2019  Admitted From: Home Disposition:  Home  Recommendations for Outpatient Follow-up:  1. Follow up with PCP in 1-2 weeks 2. Please obtain BMP/CBC in one week 3. Please follow up with [facility] orthopedics in 10-14 days for staple removal 4. Lisinopril/HCTZ was discontinued on discharge.  This was held during admission and patient did not require PRN antihypertensives.  Avoid over-treating hypertension in 86 year old who is at high risk of falls. 5. Continue Lovenox shots for 2 weeks for DVT prophylaxis  Home Health: Yes  Equipment/Devices: 3-N-1, Wheelchair manual  Discharge Condition: Stable CODE STATUS: Full  Diet recommendation: Heart Healthy  Brief/Interim Summary:  Gerald [name] a 86 y.o.malewith medical history significant ofchronic anemia secondary to CKD and myelodysplastic syndrome followed by hematology, A. fib, hypertension, prostate cancer, testicular cancer, hemochromatosis presented to the ED today with left hip pain after mechanical fall at home.Found to have left femoral neck fracture on xray in the ED. Admitted to hospitalist service with orthopedics consult. Plan is for surgery 3/12 at 1 pm. Labs notable for mild leukocytosis, likely reactive in setting of acute injury, and anemia slightly worse than baseline.Underwent left hip hemiarthroplasty with Dr. Rudene Christians on 3/12.  Left femoral neck fracture- secondary to mechanical fall.  Status post left hip hemiarthroplasty on 3/12. --ortho consulted, Dr. Rudene Christians --pain control with scheduled Tylenol, PRN meds if needed --bowel regimen --PT recommended SNF, but family declined --please ensure pain controlled before PT sessions so patient able to tolerate --Weightbearing as tolerated on left lower extremity --DVT prophylaxis with Lovenox  x14 days, SCDs and TED hose --Follow-up with [facility] in 10 to 14 days for staple removal  ParoxysmalA-fib ([place])- rate controlled. Not on anticoagulation or rate control meds.  Sinus rhythm with ectopy on ECG. --telemetry monitoring --Maintain K> 4.0 and Mg> 2.0  Leukocytosis- likely reactive in setting of acute fracture. No evidence of infection.  QTc Prolongation - QTc 573 on ECG at time of admission --telemetry monitoring --avoid QT prolonging agents as much as possible --maintain K>4 and Mg>2  Osteoarthritis, left knee pain- left knee steroid injection performed by orthopedist on 3/15.  Essential (primary) hypertension- chronic, stable. Takes lisinopril/HCTZ at home.  Stopped lisinopril/HCTZ, not indicated based on BP readings during admission with medication held.   Anemia of chronic disease- no evidence of active bleeding. Hbg 7.9 on admission.  Remained stable.  Hemochromatosis MDS (myelodysplastic syndrome), low grade (HCC) --follows with heme/onc for both, and anemia --monitor CBC and renal function   Discharge Diagnoses: Principal Problem:   Left displaced femoral neck fracture (HCC) Active Problems:   A-fib (HCC)   Essential (primary) hypertension   Anemia of chronic disease   Leukocytosis   Hemochromatosis   MDS (myelodysplastic syndrome), low grade (HCC)    Discharge Instructions   Discharge Instructions    Call MD for:  redness, tenderness, or signs of infection (pain, swelling, redness, odor or green/yellow discharge around incision site)   Complete by: As directed    Call MD for:  severe uncontrolled pain   Complete by: As directed    Call MD for:  temperature >100.4   Complete by: As directed    Diet - low sodium heart healthy   Complete by: As directed    Discharge instructions   Complete by: As directed    Per Orthopedic Surgeon: -- Follow-up with  [facility] in 10-14 days for staple removal. --Continue Lovenox 40mg   daily for 14 days. --Weight-Bearing as tolerated to left leg   Increase activity slowly   Complete by: As directed      Allergies as of 06/01/2019      Reactions   Levofloxacin Other (See Comments)   Taste alterations for about 1 month after taking med      Medication List    STOP taking these medications   lisinopril-hydrochlorothiazide 10-12.5 MG tablet Commonly known as: ZESTORETIC     TAKE these medications   acetaminophen 325 MG tablet Commonly known as: TYLENOL Take 2 tablets (650 mg total) by mouth every 6 (six) hours.   aspirin EC 81 MG tablet Take 81 mg by mouth once.   CALCIUM-MAGNESUIUM-ZINC 333-133-8.3 MG Tabs Take 1 tablet by mouth daily.   docusate sodium 100 MG capsule Commonly known as: COLACE Take 100 mg by mouth at bedtime.   enoxaparin 40 MG/0.4ML injection Commonly known as: LOVENOX Inject 0.4 mLs (40 mg total) into the skin daily.   oxyCODONE 5 MG immediate release tablet Commonly known as: Oxy IR/ROXICODONE Take 1 tablet (5 mg total) by mouth every 4 (four) hours as needed for severe pain.   Potassium 99 MG Tabs Take 1 tablet by mouth daily.   Turmeric 500 MG Tabs Take 500 mg by mouth at bedtime.   vitamin B-12 1000 MCG tablet Commonly known as: CYANOCOBALAMIN Take 1,000 mcg by mouth daily.   Vitamin D (Cholecalciferol) 25 MCG (1000 UT) Caps Take 1,000 Units by mouth daily.            Durable Medical Equipment  (From admission, onward)         Start     Ordered   05/31/19 1422  For home use only DME 3 n 1  Once     05/31/19 1421   05/31/19 1422  For home use only DME lightweight manual wheelchair with seat cushion  Once    Comments: Patient suffers from pain due to left hip fracture which impairs their ability to perform daily activities like bathing, dressing, grooming and toileting in the home.  A walker will not resolve  issue with performing activities of daily living. A wheelchair will allow patient to safely perform  daily activities. Patient is not able to propel themselves in the home using a standard weight wheelchair due to general weakness. Patient can self propel in the lightweight wheelchair. Length of need 6 months. Accessories: elevating leg rests (ELRs), wheel locks, extensions and anti-tippers.   05/31/19 1422   05/28/19 1645  DME Walker rolling  Once    Question Answer Comment  Walker: With 5 Inch Wheels   Patient needs a walker to treat with the following condition Status post hip hemiarthroplasty      05/28/19 1644   05/28/19 1645  DME 3 n 1  Once     05/28/19 1644   05/28/19 1645  DME Bedside commode  Once    Question:  Patient needs a bedside commode to treat with the following condition  Answer:  Status post hip hemiarthroplasty   05/28/19 1644         Follow-up Information    Duanne Guess, PA-C Follow up in 14 day(s).   Specialties: Orthopedic Surgery, Emergency Medicine Why: Staple Removal. Contact information: [place] [address] [phone]          Allergies  Allergen Reactions  . Levofloxacin Other (See  Comments)    Taste alterations for about 1 month after taking med    Consultations:  Orthopedics, Dr. Rudene Christians    Procedures/Studies: DG Chest 1 View  Result Date: 05/27/2019 CLINICAL DATA:  Recent fall with known left hip fracture, initial encounter EXAM: CHEST  1 VIEW COMPARISON:  05/05/2013 FINDINGS: Cardiac shadow is enlarged. Tortuous thoracic aorta with atherosclerotic calcifications is again noted. Mild interstitial changes are seen which may be related to a mild degree of CHF. No focal infiltrate or effusion is seen. No bony abnormality is noted. IMPRESSION: Mild increased interstitial changes which may be related to some vascular congestion and mild CHF. Electronically Signed   By: Inez Catalina M.D.   On: 05/27/2019 15:35   DG Knee 1-2 Views Left  Result Date: 05/30/2019 CLINICAL DATA:  Left knee joint effusion, pain and  swelling, fall prior to surgery EXAM: LEFT KNEE - 1-2 VIEW COMPARISON:  None. FINDINGS: No fracture or dislocation of the left knee. There is moderate to severe tricompartmental joint space narrowing and osteophytosis. Probable calcified loose bodies in the superior joint space. Large, nonspecific knee joint effusion. Vascular calcinosis. IMPRESSION: 1. No fracture or dislocation of the left knee. Moderate to severe tricompartmental osteoarthritis. 2. Large, nonspecific knee joint effusion. Probable calcified loose bodies in the superior joint space. Electronically Signed   By: Eddie Candle M.D.   On: 05/30/2019 15:02   DG HIP OPERATIVE UNILAT W OR W/O PELVIS LEFT  Result Date: 05/28/2019 CLINICAL DATA:  Left femoral neck fracture EXAM: OPERATIVE LEFT HIP WITH PELVIS COMPARISON:  05/27/2019 FLUOROSCOPY TIME:  Radiation Exposure Index (as provided by the fluoroscopic device): 1.5 mGy If the device does not provide the exposure index: Fluoroscopy Time:  6 seconds Number of Acquired Images:  2 FINDINGS: Left hip prosthesis is noted in satisfactory position. IMPRESSION: Status post left hip replacement Electronically Signed   By: Inez Catalina M.D.   On: 05/28/2019 15:35   DG HIP UNILAT W OR W/O PELVIS 2-3 VIEWS LEFT  Result Date: 05/28/2019 CLINICAL DATA:  Postoperative for left total hip prosthesis placement EXAM: DG HIP (WITH OR WITHOUT PELVIS) 2-3V LEFT COMPARISON:  05/28/2019 intraoperative radiographs, and preoperative assessment of 05/27/2019 FINDINGS: Frontal and cross-table lateral images of the left total hip prosthesis demonstrate expected orientation and appearance without periprosthetic fracture or acute complicating feature. Bony demineralization is present. Atherosclerotic vascular calcifications are also noted. Small amount of gas present in the lateral soft tissues along with skin staples. IMPRESSION: Left total hip prosthesis in place without complicating feature observed. Electronically Signed    By: Van Clines M.D.   On: 05/28/2019 16:15   DG Hip Unilat W or Wo Pelvis 2-3 Views Left  Result Date: 05/27/2019 CLINICAL DATA:  Recent fall with hip pain, initial encounter EXAM: DG HIP (WITH OR WITHOUT PELVIS) 2-3V LEFT COMPARISON:  None. FINDINGS: Subcapital left femoral neck fracture is noted with impaction and angulation at the fracture site. Pelvic ring is intact. Chronic degenerative changes of the hip joints are seen as well as the lumbar spine. Aortic and iliac calcifications are seen. IMPRESSION: Left femoral neck fracture with impaction and angulation at the fracture site. Electronically Signed   By: Inez Catalina M.D.   On: 05/27/2019 15:30       Subjective: Patient seen with wife at bedside.  He denies pain or other acute complaints.  No acute events reported.  Wife asking about equipment for home and discharge planning.   Discharge Exam: Vitals:  05/31/19 2329 06/01/19 0826  BP: 128/64 (!) 135/57  Pulse: 80 80  Resp: 17 16  Temp: 98.8 F (37.1 C) 98.1 F (36.7 C)  SpO2: 96% 97%   Vitals:   05/31/19 0027 05/31/19 1100 05/31/19 2329 06/01/19 0826  BP: (!) 147/72 140/70 128/64 (!) 135/57  Pulse: 91 90 80 80  Resp: 17 17 17 16   Temp: 98.4 F (36.9 C) 98.4 F (36.9 C) 98.8 F (37.1 C) 98.1 F (36.7 C)  TempSrc: Oral Oral Oral Oral  SpO2: 95% 95% 96% 97%  Weight:      Height:        General: Pt is alert, awake, not in acute distress Cardiovascular: RRR, S1/S2 +, no rubs, no gallops Respiratory: CTA bilaterally, no wheezing, no rhonchi Abdominal: Soft, NT, ND, bowel sounds + Extremities: no edema, no cyanosis    The results of significant diagnostics from this hospitalization (including imaging, microbiology, ancillary and laboratory) are listed below for reference.     Microbiology: Recent Results (from the past 240 hour(s))  SARS CORONAVIRUS 2 (TAT 6-24 HRS) Nasopharyngeal Nasopharyngeal Swab     Status: None   Collection Time: 05/27/19  4:49  PM   Specimen: Nasopharyngeal Swab  Result Value Ref Range Status   SARS Coronavirus 2 NEGATIVE NEGATIVE Final    Comment: (NOTE) SARS-CoV-2 target nucleic acids are NOT DETECTED. The SARS-CoV-2 RNA is generally detectable in upper and lower respiratory specimens during the acute phase of infection. Negative results do not preclude SARS-CoV-2 infection, do not rule out co-infections with other pathogens, and should not be used as the sole basis for treatment or other patient management decisions. Negative results must be combined with clinical observations, patient history, and epidemiological information. The expected result is Negative. Fact Sheet for Patients: SugarRoll.be Fact Sheet for Healthcare Providers: https://www.woods-mathews.com/ This test is not yet approved or cleared by the [place] FDA and  has been authorized for detection and/or diagnosis of SARS-CoV-2 by FDA under an Emergency Use Authorization (EUA). This EUA will remain  in effect (meaning this test can be used) for the duration of the COVID-19 declaration under Section 56 4(b)(1) of the Act, 21 U.S.C. section 360bbb-3(b)(1), unless the authorization is terminated or revoked sooner. Performed at [facility], [place] [address] [place], [place] [address]   Surgical pcr screen     Status: None   Collection Time: 05/27/19 10:30 PM   Specimen: Nasal Mucosa; Nasal Swab  Result Value Ref Range Status   MRSA, PCR NEGATIVE NEGATIVE Final   Staphylococcus aureus NEGATIVE NEGATIVE Final    Comment: (NOTE) The Xpert SA Assay (FDA approved for NASAL specimens in patients 40 years of age and older), is one component of a comprehensive surveillance program. It is not intended to diagnose infection nor to guide or monitor treatment. Performed at [facility], [address] [place], [place] [address]      Labs: BNP (last 3 results) No results for  input(s): BNP in the last 8760 hours. Basic Metabolic Panel: Recent Labs  Lab 05/27/19 1430 05/28/19 0521 05/28/19 1739 05/29/19 0427 05/30/19 0549  NA 143 142  --  142 141  K 3.5 3.4*  --  3.7 3.6  CL 110 111  --  114* 113*  CO2 24 23  --  20* 20*  GLUCOSE 129* 137*  --  134* 116*  BUN 29* 27*  --  27* 28*  CREATININE 0.98 0.90 0.91 1.11 0.98  CALCIUM 8.9 8.7*  --  7.9* 8.0*  MG  --   --   --  1.8 1.7   Liver Function Tests: No results for input(s): AST, ALT, ALKPHOS, BILITOT, PROT, ALBUMIN in the last 168 hours. No results for input(s): LIPASE, AMYLASE in the last 168 hours. No results for input(s): AMMONIA in the last 168 hours. CBC: Recent Labs  Lab 05/27/19 1430 05/27/19 1430 05/28/19 0521 05/28/19 1739 05/29/19 0427 05/30/19 0549 06/01/19 0524  WBC 12.6*  --  11.4*  --  13.9* 11.0* 9.1  HGB 7.9*   < > 7.8* 9.7* 8.2* 7.9* 8.0*  HCT 23.6*   < > 23.0* 28.7* 23.2* 23.1* 23.8*  MCV 97.9  --  97.0  --  93.9 94.7 97.1  PLT 458*  --  354  --  326 317 364   < > = values in this interval not displayed.   Cardiac Enzymes: No results for input(s): CKTOTAL, CKMB, CKMBINDEX, TROPONINI in the last 168 hours. BNP: Invalid input(s): POCBNP CBG: No results for input(s): GLUCAP in the last 168 hours. D-Dimer No results for input(s): DDIMER in the last 72 hours. Hgb A1c No results for input(s): HGBA1C in the last 72 hours. Lipid Profile No results for input(s): CHOL, HDL, LDLCALC, TRIG, CHOLHDL, LDLDIRECT in the last 72 hours. Thyroid function studies No results for input(s): TSH, T4TOTAL, T3FREE, THYROIDAB in the last 72 hours.  Invalid input(s): FREET3 Anemia work up No results for input(s): VITAMINB12, FOLATE, FERRITIN, TIBC, IRON, RETICCTPCT in the last 72 hours. Urinalysis    Component Value Date/Time   COLORURINE AMBER (A) 05/28/2019 0604   APPEARANCEUR CLOUDY (A) 05/28/2019 0604   APPEARANCEUR Clear 05/05/2013 1500   LABSPEC 1.027 05/28/2019 0604   LABSPEC  1.014 05/05/2013 1500   PHURINE 6.0 05/28/2019 0604   GLUCOSEU NEGATIVE 05/28/2019 0604   GLUCOSEU Negative 05/05/2013 1500   HGBUR NEGATIVE 05/28/2019 0604   BILIRUBINUR NEGATIVE 05/28/2019 0604   BILIRUBINUR Negative 05/05/2013 1500   KETONESUR 5 (A) 05/28/2019 0604   PROTEINUR 30 (A) 05/28/2019 0604   NITRITE NEGATIVE 05/28/2019 0604   LEUKOCYTESUR NEGATIVE 05/28/2019 0604   LEUKOCYTESUR Negative 05/05/2013 1500   Sepsis Labs Invalid input(s): PROCALCITONIN,  WBC,  LACTICIDVEN Microbiology Recent Results (from the past 240 hour(s))  SARS CORONAVIRUS 2 (TAT 6-24 HRS) Nasopharyngeal Nasopharyngeal Swab     Status: None   Collection Time: 05/27/19  4:49 PM   Specimen: Nasopharyngeal Swab  Result Value Ref Range Status   SARS Coronavirus 2 NEGATIVE NEGATIVE Final    Comment: (NOTE) SARS-CoV-2 target nucleic acids are NOT DETECTED. The SARS-CoV-2 RNA is generally detectable in upper and lower respiratory specimens during the acute phase of infection. Negative results do not preclude SARS-CoV-2 infection, do not rule out co-infections with other pathogens, and should not be used as the sole basis for treatment or other patient management decisions. Negative results must be combined with clinical observations, patient history, and epidemiological information. The expected result is Negative. Fact Sheet for Patients: SugarRoll.be Fact Sheet for Healthcare Providers: https://www.woods-mathews.com/ This test is not yet approved or cleared by the [place] FDA and  has been authorized for detection and/or diagnosis of SARS-CoV-2 by FDA under an Emergency Use Authorization (EUA). This EUA will remain  in effect (meaning this test can be used) for the duration of the COVID-19 declaration under Section 56 4(b)(1) of the Act, 21 U.S.C. section 360bbb-3(b)(1), unless the authorization is terminated or revoked sooner. Performed at [facility], [place]  [address], [place], [place] [address]   Surgical pcr screen     Status: None   Collection Time: 05/27/19 10:30 PM   Specimen: Nasal Mucosa; Nasal Swab  Result Value Ref Range Status   MRSA, PCR NEGATIVE NEGATIVE Final   Staphylococcus aureus NEGATIVE NEGATIVE Final    Comment: (NOTE) The Xpert SA Assay (FDA approved for NASAL specimens in patients 47 years of age and older), is one component of a comprehensive surveillance program. It is not intended to diagnose infection nor to guide or monitor treatment. Performed at [facility], [address] [place], [place] [address]      Time coordinating discharge: Over 30 minutes  SIGNED:   Ezekiel Slocumb, DO [facility] 06/01/2019, 10:27 AM   If 7PM-7AM, please contact night-coverage [email or web address]

## 2019-06-01 NOTE — Progress Notes (Signed)
   Subjective: 4 Days Post-Op Procedure(s) (LRB): ANTERIOR APPROACH HEMI HIP ARTHROPLASTY (Left) Patient reports pain as mild.   Patient is well, and has had no acute complaints or problems Denies any CP, SOB, ABD pain. We will continue therapy today, making progress today with PT  Objective: Vital signs in last 24 hours: Temp:  [98.1 F (36.7 C)-98.8 F (37.1 C)] 98.1 F (36.7 C) (03/16 0826) Pulse Rate:  [80] 80 (03/16 0826) Resp:  [16-17] 16 (03/16 0826) BP: (128-135)/(57-64) 135/57 (03/16 0826) SpO2:  [96 %-97 %] 97 % (03/16 0826)  Intake/Output from previous day: 03/15 0701 - 03/16 0700 In: 290 [P.O.:240; IV Piggyback:50] Out: -  Intake/Output this shift: No intake/output data recorded.  Recent Labs    05/30/19 0549 06/01/19 0524  HGB 7.9* 8.0*   Recent Labs    05/30/19 0549 06/01/19 0524  WBC 11.0* 9.1  RBC 2.44* 2.45*  HCT 23.1* 23.8*  PLT 317 364   Recent Labs    05/30/19 0549  NA 141  K 3.6  CL 113*  CO2 20*  BUN 28*  CREATININE 0.98  GLUCOSE 116*  CALCIUM 8.0*   No results for input(s): LABPT, INR in the last 72 hours.  EXAM General - Patient is Alert, Appropriate and Oriented Extremity - Neurovascular intact Sensation intact distally Intact pulses distally Dorsiflexion/Plantar flexion intact No cellulitis present Compartment soft Dressing - dressing C/D/I and no drainage Motor Function - intact, moving foot and toes well on exam.   Past Medical History:  Diagnosis Date  . Anemia   . Atrial fibrillation ([place])   . Cancer [facility])    Testicular  . Hemochromatosis 10/25/2014  . Hx of basal cell carcinoma 2008   R. ant. zygomatic/sideburn 2008, L preauricular 2019  . Hypertension   . Prostate cancer ([place])     Assessment/Plan:   4 Days Post-Op Procedure(s) (LRB): ANTERIOR APPROACH HEMI HIP ARTHROPLASTY (Left) Principal Problem:   Left displaced femoral neck fracture (HCC) Active Problems:   A-fib (HCC)   Essential (primary)  hypertension   Hemochromatosis   Anemia of chronic disease   MDS (myelodysplastic syndrome), low grade (HCC)   Leukocytosis  Estimated body mass index is 21.49 kg/m as calculated from the following:   Height as of this encounter: 5\' 9"  (1.753 m).   Weight as of this encounter: 66 kg. Advance diet Up with therapy  Pain well controlled VSS CM to assist with discharge to home with HHPT  Follow up with [facility] in 2 weeks for staple removal Lovenox 40 mg sq daily x 14 days   DVT Prophylaxis - Lovenox, Foot Pumps and TED hose Weight-Bearing as tolerated to left leg   T. Rachelle Hora, PA-C [facility] 06/01/2019, 11:47 AM

## 2019-06-04 ENCOUNTER — Telehealth: Payer: Self-pay | Admitting: Nurse Practitioner

## 2019-06-04 NOTE — Telephone Encounter (Signed)
Spoke with patient's daughter [name] regarding Palliative services and all questions were answered and she was in agreement with this.  I have scheduled an In-person Consult for 06/29/19 @ 1 PM

## 2019-06-21 ENCOUNTER — Other Ambulatory Visit: Payer: Self-pay | Admitting: *Deleted

## 2019-06-21 ENCOUNTER — Inpatient Hospital Stay: Payer: Medicare Other

## 2019-06-21 ENCOUNTER — Other Ambulatory Visit: Payer: Self-pay

## 2019-06-21 ENCOUNTER — Inpatient Hospital Stay (HOSPITAL_BASED_OUTPATIENT_CLINIC_OR_DEPARTMENT_OTHER): Payer: Medicare Other | Admitting: Internal Medicine

## 2019-06-21 ENCOUNTER — Encounter: Payer: Self-pay | Admitting: Internal Medicine

## 2019-06-21 ENCOUNTER — Inpatient Hospital Stay: Payer: Medicare Other | Attending: Internal Medicine

## 2019-06-21 DIAGNOSIS — D4620 Refractory anemia with excess of blasts, unspecified: Secondary | ICD-10-CM

## 2019-06-21 DIAGNOSIS — Z85828 Personal history of other malignant neoplasm of skin: Secondary | ICD-10-CM | POA: Insufficient documentation

## 2019-06-21 DIAGNOSIS — I4891 Unspecified atrial fibrillation: Secondary | ICD-10-CM | POA: Diagnosis not present

## 2019-06-21 DIAGNOSIS — D649 Anemia, unspecified: Secondary | ICD-10-CM

## 2019-06-21 DIAGNOSIS — N189 Chronic kidney disease, unspecified: Secondary | ICD-10-CM | POA: Insufficient documentation

## 2019-06-21 DIAGNOSIS — I129 Hypertensive chronic kidney disease with stage 1 through stage 4 chronic kidney disease, or unspecified chronic kidney disease: Secondary | ICD-10-CM | POA: Insufficient documentation

## 2019-06-21 DIAGNOSIS — Z87891 Personal history of nicotine dependence: Secondary | ICD-10-CM | POA: Insufficient documentation

## 2019-06-21 DIAGNOSIS — Z79899 Other long term (current) drug therapy: Secondary | ICD-10-CM | POA: Diagnosis not present

## 2019-06-21 DIAGNOSIS — D631 Anemia in chronic kidney disease: Secondary | ICD-10-CM | POA: Insufficient documentation

## 2019-06-21 DIAGNOSIS — D469 Myelodysplastic syndrome, unspecified: Secondary | ICD-10-CM | POA: Diagnosis not present

## 2019-06-21 DIAGNOSIS — Z7901 Long term (current) use of anticoagulants: Secondary | ICD-10-CM | POA: Insufficient documentation

## 2019-06-21 LAB — CBC WITH DIFFERENTIAL/PLATELET
Abs Immature Granulocytes: 0.03 10*3/uL (ref 0.00–0.07)
Basophils Absolute: 0.1 10*3/uL (ref 0.0–0.1)
Basophils Relative: 1 %
Eosinophils Absolute: 0.4 10*3/uL (ref 0.0–0.5)
Eosinophils Relative: 5 %
HCT: 26.1 % — ABNORMAL LOW (ref 39.0–52.0)
Hemoglobin: 8.6 g/dL — ABNORMAL LOW (ref 13.0–17.0)
Immature Granulocytes: 0 %
Lymphocytes Relative: 20 %
Lymphs Abs: 1.6 10*3/uL (ref 0.7–4.0)
MCH: 30.9 pg (ref 26.0–34.0)
MCHC: 33 g/dL (ref 30.0–36.0)
MCV: 93.9 fL (ref 80.0–100.0)
Monocytes Absolute: 0.9 10*3/uL (ref 0.1–1.0)
Monocytes Relative: 12 %
Neutro Abs: 4.8 10*3/uL (ref 1.7–7.7)
Neutrophils Relative %: 62 %
Platelets: 396 10*3/uL (ref 150–400)
RBC: 2.78 MIL/uL — ABNORMAL LOW (ref 4.22–5.81)
RDW: 22.5 % — ABNORMAL HIGH (ref 11.5–15.5)
WBC: 7.7 10*3/uL (ref 4.0–10.5)
nRBC: 0 % (ref 0.0–0.2)

## 2019-06-21 LAB — SAMPLE TO BLOOD BANK

## 2019-06-21 LAB — COMPREHENSIVE METABOLIC PANEL
ALT: 23 U/L (ref 0–44)
AST: 20 U/L (ref 15–41)
Albumin: 3.8 g/dL (ref 3.5–5.0)
Alkaline Phosphatase: 78 U/L (ref 38–126)
Anion gap: 9 (ref 5–15)
BUN: 36 mg/dL — ABNORMAL HIGH (ref 8–23)
CO2: 26 mmol/L (ref 22–32)
Calcium: 9.1 mg/dL (ref 8.9–10.3)
Chloride: 107 mmol/L (ref 98–111)
Creatinine, Ser: 0.93 mg/dL (ref 0.61–1.24)
GFR calc Af Amer: >60 mL/min (ref >60)
GFR calc non Af Amer: >60 mL/min (ref >60)
Glucose, Bld: 135 mg/dL — ABNORMAL HIGH (ref 70–99)
Potassium: 3.8 mmol/L (ref 3.5–5.1)
Sodium: 142 mmol/L (ref 135–145)
Total Bilirubin: 1.4 mg/dL — ABNORMAL HIGH (ref 0.3–1.2)
Total Protein: 6.8 g/dL (ref 6.5–8.1)

## 2019-06-21 MED ORDER — EPOETIN ALFA-EPBX 10000 UNIT/ML IJ SOLN
20000.0000 [IU] | Freq: Once | INTRAMUSCULAR | Status: AC
  Administered 2019-06-21: 14:00:00 20000 [IU] via SUBCUTANEOUS
  Filled 2019-06-21: qty 2

## 2019-06-21 NOTE — Assessment & Plan Note (Addendum)
#    Anemia ?  Likely secondary to MDS. [no previous BMBx]- Today hemoglobin is 8.7.  Proceed with Retacrit.   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  Stable December 2020-iron saturation 36%.  #I spoke to patient's daughter  regarding above plan.  She is in agreement.  # DISPOSITION:  # Retacrit today # in 1 month- possible Retacrit- labs- H&H # Follow up in 2 months- MD; CBC/cmp-possible retacrit ; -Dr.B

## 2019-06-21 NOTE — Progress Notes (Signed)
[facility] OFFICE PROGRESS NOTE  Patient Care Team: [name], MD as PCP - General (Family Medicine)   SUMMARY OF HEMATOLOGIC HISTORY:  # Anemia sec to ? CKD  Macrocytic ? MDS- no BMBx] on Procrit 20000 Units q ? 33M; on aranesp every 2 months [poor to tolerance/fatigue- ] FEB 2021- switch to retacrit   # ? Hemochromatosis- heterozygous [ferritin 800-900s] - on surveillance.   INTERVAL HISTORY:   A very pleasant 86 year-old male patient who is fairly active for his age currently on procrit for anemia secondary to CKD/MDS is here for follow-up.  The interim patient patient is a fall on his left hip; fracture of the left hip.  He underwent left hip surgery with Dr. Rudene Christians last month.  Patient received 1 to PRBC transfusion when his hemoglobin was down to 7.8.  At the time of discharge his hemoglobin was around 8.  As per the daughter patient feels quite tired/and has not recovered overall effects of the surgery yet.   Review of Systems  Constitutional: Positive for malaise/fatigue. Negative for chills, diaphoresis, fever and weight loss.  HENT: Negative for nosebleeds and sore throat.   Eyes: Negative for double vision.  Respiratory: Negative for cough, hemoptysis, sputum production, shortness of breath and wheezing.   Cardiovascular: Negative for chest pain, palpitations, orthopnea and leg swelling.  Gastrointestinal: Negative for abdominal pain, blood in stool, constipation, diarrhea, heartburn, melena, nausea and vomiting.  Genitourinary: Negative for dysuria, frequency and urgency.  Musculoskeletal: Positive for back pain. Negative for joint pain.  Skin: Negative.  Negative for itching and rash.  Neurological: Positive for dizziness. Negative for tingling, focal weakness, weakness and headaches.  Endo/Heme/Allergies: Does not bruise/bleed easily.  Psychiatric/Behavioral: Negative for depression. The patient is not nervous/anxious and does not have insomnia.       PAST MEDICAL HISTORY :  Past Medical History:  Diagnosis Date  . Anemia   . Atrial fibrillation ([place])   . Cancer [facility])    Testicular  . Hemochromatosis 10/25/2014  . Hx of basal cell carcinoma 2008   R. ant. zygomatic/sideburn 2008, L preauricular 2019  . Hypertension   . Prostate cancer ([place])     PAST SURGICAL HISTORY :   Past Surgical History:  Procedure Laterality Date  . ANTERIOR APPROACH HEMI HIP ARTHROPLASTY Left 05/28/2019   Procedure: ANTERIOR APPROACH HEMI HIP ARTHROPLASTY;  Surgeon: Hessie Knows, MD;  Location: [facility] ORS;  Service: Orthopedics;  Laterality: Left;  . APPENDECTOMY    . CHOLECYSTECTOMY    . HAND SURGERY Bilateral   . INNER EAR SURGERY    . KYPHOPLASTY N/A 12/14/2015   Procedure: KYPHOPLASTY  L2;  Surgeon: Hessie Knows, MD;  Location: [facility] ORS;  Service: Orthopedics;  Laterality: N/A;  . PROSTATE SURGERY    . SURGERY SCROTAL / TESTICULAR Right     FAMILY HISTORY :   Family History  Problem Relation Age of Onset  . Hypertension Mother     SOCIAL HISTORY:   Social History   Tobacco Use  . Smoking status: Former Smoker    Types: Cigarettes    Quit date: 03/22/1958    Years since quitting: 61.2  . Smokeless tobacco: Never Used  Substance Use Topics  . Alcohol use: No  . Drug use: No    ALLERGIES:  is allergic to levofloxacin.  MEDICATIONS:  Current Outpatient Medications  Medication Sig Dispense Refill  . acetaminophen (TYLENOL) 325 MG tablet Take 2 tablets (650 mg total) by mouth every  6 (six) hours.    [name] aspirin EC 81 MG tablet Take 81 mg by mouth once.     [name] CALCIUM-MAGNESUIUM-ZINC 333-133-8.3 MG TABS Take 1 tablet by mouth daily.    [name] docusate sodium (COLACE) 100 MG capsule Take 100 mg by mouth at bedtime.    [name] oxyCODONE (OXY IR/ROXICODONE) 5 MG immediate release tablet Take 1 tablet (5 mg total) by mouth every 4 (four) hours as needed for severe pain. 42 tablet 0  . Potassium 99 MG TABS Take 1 tablet by mouth daily.     . Turmeric  500 MG TABS Take 500 mg by mouth at bedtime.    . vitamin B-12 (CYANOCOBALAMIN) 1000 MCG tablet Take 1,000 mcg by mouth daily.    . Vitamin D, Cholecalciferol, 1000 units CAPS Take 1,000 Units by mouth daily.    [name] enoxaparin (LOVENOX) 40 MG/0.4ML injection Inject 0.4 mLs (40 mg total) into the skin daily. (Patient not taking: Reported on 06/21/2019) 5.6 mL 0  . traMADol (ULTRAM) 50 MG tablet Take 50 mg by mouth every 6 (six) hours as needed.     No current facility-administered medications for this visit.    PHYSICAL EXAMINATION:   BP 130/71 (BP Location: Right Arm, Patient Position: Sitting, Cuff Size: Normal)   Pulse 74   Temp 98.1 F (36.7 C) (Tympanic)   Filed Weights    Physical Exam  Constitutional: He is oriented to person, place, and time.  Elderly male patient; he is in a wheelchair.  He is accompanied by his daughter.  Looks younger than his stated age.  HENT:  Head: Normocephalic and atraumatic.  Mouth/Throat: Oropharynx is clear and moist. No oropharyngeal exudate.  Eyes: Pupils are equal, round, and reactive to light.  Cardiovascular: Normal rate and regular rhythm.  Pulmonary/Chest: Effort normal and breath sounds normal. No respiratory distress. He has no wheezes.  Abdominal: Soft. Bowel sounds are normal. He exhibits no distension and no mass. There is no abdominal tenderness. There is no rebound and no guarding.  Musculoskeletal:        General: No tenderness or edema. Normal range of motion.     Cervical back: Normal range of motion and neck supple.  Neurological: He is alert and oriented to person, place, and time.  Skin: Skin is warm.  Psychiatric: Affect normal.     LABORATORY DATA:  I have reviewed the data as listed    Component Value Date/Time   NA 142 06/21/2019 1310   NA 142 05/06/2013 0158   K 3.8 06/21/2019 1310   K 3.7 05/06/2013 0158   CL 107 06/21/2019 1310   CL 112 (H) 05/06/2013 0158   CO2 26 06/21/2019 1310   CO2 24 05/06/2013 0158    GLUCOSE 135 (H) 06/21/2019 1310   GLUCOSE 95 05/06/2013 0158   BUN 36 (H) 06/21/2019 1310   BUN 32 (H) 05/06/2013 0158   CREATININE 0.93 06/21/2019 1310   CREATININE 1.22 11/25/2013 1332   CALCIUM 9.1 06/21/2019 1310   CALCIUM 9.1 05/06/2013 0158   PROT 6.8 06/21/2019 1310   PROT 6.1 (L) 05/06/2013 0158   ALBUMIN 3.8 06/21/2019 1310   ALBUMIN 3.5 05/06/2013 0158   AST 20 06/21/2019 1310   AST 22 05/06/2013 0158   ALT 23 06/21/2019 1310   ALT 18 05/06/2013 0158   ALKPHOS 78 06/21/2019 1310   ALKPHOS 60 05/06/2013 0158   BILITOT 1.4 (H) 06/21/2019 1310   BILITOT 1.4 (H) 05/06/2013 0158   GFRNONAA >60  06/21/2019 1310   GFRNONAA 52 (L) 11/25/2013 1332   GFRAA >60 06/21/2019 1310   GFRAA >60 11/25/2013 1332    No results found for: SPEP, UPEP  Lab Results  Component Value Date   WBC 7.7 06/21/2019   NEUTROABS 4.8 06/21/2019   HGB 8.6 (L) 06/21/2019   HCT 26.1 (L) 06/21/2019   MCV 93.9 06/21/2019   PLT 396 06/21/2019      Chemistry      Component Value Date/Time   NA 142 06/21/2019 1310   NA 142 05/06/2013 0158   K 3.8 06/21/2019 1310   K 3.7 05/06/2013 0158   CL 107 06/21/2019 1310   CL 112 (H) 05/06/2013 0158   CO2 26 06/21/2019 1310   CO2 24 05/06/2013 0158   BUN 36 (H) 06/21/2019 1310   BUN 32 (H) 05/06/2013 0158   CREATININE 0.93 06/21/2019 1310   CREATININE 1.22 11/25/2013 1332      Component Value Date/Time   CALCIUM 9.1 06/21/2019 1310   CALCIUM 9.1 05/06/2013 0158   ALKPHOS 78 06/21/2019 1310   ALKPHOS 60 05/06/2013 0158   AST 20 06/21/2019 1310   AST 22 05/06/2013 0158   ALT 23 06/21/2019 1310   ALT 18 05/06/2013 0158   BILITOT 1.4 (H) 06/21/2019 1310   BILITOT 1.4 (H) 05/06/2013 0158       RADIOGRAPHIC STUDIES: I have personally reviewed the radiological images as listed and agreed with the findings in the report. No results found.   ASSESSMENT & PLAN:   MDS (myelodysplastic syndrome), low grade (HCC) #  Anemia ?  Likely secondary to  MDS. [no previous BMBx]- Today hemoglobin is 8.7.  Proceed with Retacrit.   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  Stable December 2020-iron saturation 36%.  #I spoke to patient's daughter  regarding above plan.  She is in agreement.  # DISPOSITION:  # Retacrit today # in 1 month- possible Retacrit- labs- H&H # Follow up in 2 months- MD; CBC/cmp-possible retacrit ; -Dr.B     Cammie Sickle, MD 06/21/2019 2:47 PM

## 2019-06-24 ENCOUNTER — Ambulatory Visit: Payer: Medicare Other | Admitting: Dermatology

## 2019-06-29 ENCOUNTER — Encounter: Payer: Self-pay | Admitting: Nurse Practitioner

## 2019-06-29 ENCOUNTER — Other Ambulatory Visit: Payer: Self-pay

## 2019-06-29 ENCOUNTER — Other Ambulatory Visit: Payer: Medicare Other | Admitting: Nurse Practitioner

## 2019-06-29 DIAGNOSIS — Z515 Encounter for palliative care: Secondary | ICD-10-CM

## 2019-06-29 DIAGNOSIS — D469 Myelodysplastic syndrome, unspecified: Secondary | ICD-10-CM

## 2019-06-29 NOTE — Progress Notes (Signed)
[facility] Consult Note Telephone: [phone]  Fax: [phone]  PATIENT NAME: Gerald [name] DOB: [id number] MRN: [id number]  PRIMARY CARE PROVIDER:   [name], MD  REFERRING PROVIDER:  [name], MD [address] [facility] [place],  [place] [address]  RESPONSIBLE PARTY:   Antony Odea daughter [id number]  I was asked by Dr Kary Kos to see Gerald [name] for palliative care visit for goals of care   RECOMMENDATIONS and PLAN:  1. ACP: Full code, discussed at length, blank MOST form and Hard Choice book given for review, family to further discuss and will revisit at next pc visit or sooner if wishes to change code status   2. Palliative care encounter; Palliative medicine team will continue to support patient, patient's family, and medical team. Visit consisted of counseling and education dealing with the complex and emotionally intense issues of symptom management and palliative care in the setting of serious and potentially life-threatening illness  I spent 95 minutes providing this consultation,  from 1:00pm to 2:35pm. More than 50% of the time in this consultation was spent coordinating communication.   HISTORY OF PRESENT ILLNESS:  Gerald [name] is a 86 y.o. year old male with multiple medical problems including Myelodysplastic syndrome, testicular cancer s/p sgy, prostate cancer, history basal cell carcinoma, hemochromatosis, hypertension, anemia, appendectomy, cholecystectomy, bilateral hands surgery, kyphoplasty. 4 / 5 / 2021 seen by Dr. Rogue Bussing Oncologist for anemia secondary to chronic kidney disease and myleodysplastic syndrome. Gerald [name]  did have a fall last month with a left hip fracture underwent surgery and received one unit to be transfused with his hemoglobin down to 7.8. Hemoglobin was eight on discharge. Hemoglobin 8.7 currently proceeded with Retacrit. In-home initial palliative care  visit. I visited Gerald [name]. We talked about purpose of palliative care. We talked about past medical history. We talked about prior to hip fracture he was walking with a roller walker, fairly independent though getting tired at intervals. Since hip fracture he continues to work with therapy walking with his walker and returning to his rolaid walker. Gerald [name] uses the roller walker one time yesterday. We talked about his ability to perform adl's, dressing. Family has been having to assist him with beating up until recently Gerald [name] has been feeding himself. Appetite is better. We talked about weight loss, possibly a few pounds but not significant. We talked about diagnosis of MDS receiving infusions. We talked about his Oncology appointment. We talked about symptoms of pain her what she does have in his lower back. He has been using Lidoderm patches with relief. Recommended can you use Aspercreme if needed as a trial. We talked about. We talked about fatigue and weakness. We talked about sleeping though he does get up several times during the night. Gerald [name] also naps during the day. Gerald [name] has been able to go out on the back porch as he previously was her to hospitalization as of yet. We talked about his [name] well, advance directives. We talked about medical goals of care aggressive versus conservative versus comfort care. We talked about most form. Blank MOST form left and Hard Choice book for family to further discuss and review, will revisit at next palliative appointment. We talked more about infusions, anemia. Daughter endorses wishes are to continue infusions as long as he is able to tolerate it. We talked about code status as he currently is a full code. We talked about different scenarios of  CPR. We talked about concerned for age of 84, brittle bones and that would likely result and rib fractures, fractured sternum. Gerald [name] endorses he would not want that. We talked  about do not resuscitate does not mean do not treat. Family continued to look through [name] Will to see if it specifically says if he wanted CPR or not. [name] will did so that he would not want to be prolonged. We talked about Life review.  Gerald [name] talked about his brother passing from cancer at home while he held his hand, it was peaceful. We talked about his wife passing as she ended up on a ventilator in ICU for which they had to do a withdrawal those she did pass peacefully. We did talk about what he would want at the end part of his life. Gerald [name] endorses that he would want to have his family with him and he would want to be at home. Discuss at length and decided to continue full code at present time for family to further discuss. Will revisit at next palliative care visit. We talked about role of palliative care and plan of care. 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 Gerald [name] favorite project was building his house. He does have six girls and one boy. Gerald [name] has been widowed about 6 years. We talked about follow-up palliative care visit, scheduled in 4 weeks. Families aware that they can call sooner to complete DNR form if she chooses to change from full code. Therapeutic listening and emotional support provided. Contact information provided. Questions answered to satisfaction. Palliative Care was asked to help address goals of care.   CODE STATUS: Full code  PPS: 40% HOSPICE ELIGIBILITY/DIAGNOSIS: TBD  PAST MEDICAL HISTORY:  Past Medical History:  Diagnosis Date  . Anemia   . Atrial fibrillation ([place])   . Cancer [facility])    Testicular  . Hemochromatosis 10/25/2014  . Hx of basal cell carcinoma 2008   R. ant. zygomatic/sideburn 2008, L preauricular 2019  . Hypertension   . Prostate cancer [facility])     SOCIAL HX:  Social History   Tobacco Use  . Smoking status: Former Smoker    Types: Cigarettes    Quit date: 03/22/1958    Years since quitting:  61.3  . Smokeless tobacco: Never Used  Substance Use Topics  . Alcohol use: No    ALLERGIES:  Allergies  Allergen Reactions  . Levofloxacin Other (See Comments)    Taste alterations for about 1 month after taking med     PERTINENT MEDICATIONS:  Outpatient Encounter Medications as of 06/29/2019  Medication Sig  . acetaminophen (TYLENOL) 325 MG tablet Take 2 tablets (650 mg total) by mouth every 6 (six) hours.  [name] aspirin EC 81 MG tablet Take 81 mg by mouth once.   [name] CALCIUM-MAGNESUIUM-ZINC 333-133-8.3 MG TABS Take 1 tablet by mouth daily.  [name] docusate sodium (COLACE) 100 MG capsule Take 100 mg by mouth at bedtime.  . enoxaparin (LOVENOX) 40 MG/0.4ML injection Inject 0.4 mLs (40 mg total) into the skin daily. (Patient not taking: Reported on 06/21/2019)  . oxyCODONE (OXY IR/ROXICODONE) 5 MG immediate release tablet Take 1 tablet (5 mg total) by mouth every 4 (four) hours as needed for severe pain.  [name] Potassium 99 MG TABS Take 1 tablet by mouth daily.   . traMADol (ULTRAM) 50 MG tablet Take 50 mg by mouth every 6 (six) hours as needed.  . Turmeric 500 MG TABS Take 500 mg  by mouth at bedtime.  . vitamin B-12 (CYANOCOBALAMIN) 1000 MCG tablet Take 1,000 mcg by mouth daily.  . Vitamin D, Cholecalciferol, 1000 units CAPS Take 1,000 Units by mouth daily.   No facility-administered encounter medications on file as of 06/29/2019.    PHYSICAL EXAM:   General: NAD, frail appearing, thin, chronically ill, elderly male Cardiovascular: regular rate and rhythm Pulmonary: clear ant fields Extremities: no edema, no joint deformities Neurological: generalized weakness, walks with walker  Chioke Noxon Ihor Gully, NP

## 2019-07-07 ENCOUNTER — Telehealth: Payer: Self-pay | Admitting: Nurse Practitioner

## 2019-07-07 NOTE — Telephone Encounter (Signed)
Called patient's daughter Rivka Safer, to confirm the Palliative f/u visit with NP, no answer - left message with my name and contact number.

## 2019-07-07 NOTE — Telephone Encounter (Signed)
Rec'd message that daughter [name] had returned my call.  Returned call to daughter to make sure that it was okay to schedule an in-person Palliative f/u visit with patient on 07/22/19 @ 1:30 PM and she was in agreement with this.

## 2019-07-07 NOTE — Telephone Encounter (Signed)
Called patient's daughter Lillian's cell, to confirm the 07/22/19 1:30 PM Palliative f/u visit, no answer - left message requesting a call back.

## 2019-07-19 ENCOUNTER — Inpatient Hospital Stay: Payer: Medicare Other

## 2019-07-19 ENCOUNTER — Other Ambulatory Visit: Payer: Self-pay | Admitting: *Deleted

## 2019-07-19 ENCOUNTER — Inpatient Hospital Stay: Payer: Medicare Other | Attending: Internal Medicine

## 2019-07-19 ENCOUNTER — Other Ambulatory Visit: Payer: Self-pay

## 2019-07-19 VITALS — BP 144/72 | HR 76

## 2019-07-19 DIAGNOSIS — I10 Essential (primary) hypertension: Secondary | ICD-10-CM | POA: Insufficient documentation

## 2019-07-19 DIAGNOSIS — I129 Hypertensive chronic kidney disease with stage 1 through stage 4 chronic kidney disease, or unspecified chronic kidney disease: Secondary | ICD-10-CM | POA: Diagnosis not present

## 2019-07-19 DIAGNOSIS — D649 Anemia, unspecified: Secondary | ICD-10-CM

## 2019-07-19 DIAGNOSIS — R5383 Other fatigue: Secondary | ICD-10-CM | POA: Insufficient documentation

## 2019-07-19 DIAGNOSIS — D4620 Refractory anemia with excess of blasts, unspecified: Secondary | ICD-10-CM

## 2019-07-19 DIAGNOSIS — Z8546 Personal history of malignant neoplasm of prostate: Secondary | ICD-10-CM | POA: Diagnosis not present

## 2019-07-19 DIAGNOSIS — I4891 Unspecified atrial fibrillation: Secondary | ICD-10-CM | POA: Diagnosis not present

## 2019-07-19 DIAGNOSIS — D631 Anemia in chronic kidney disease: Secondary | ICD-10-CM | POA: Insufficient documentation

## 2019-07-19 DIAGNOSIS — Z79899 Other long term (current) drug therapy: Secondary | ICD-10-CM | POA: Insufficient documentation

## 2019-07-19 DIAGNOSIS — N189 Chronic kidney disease, unspecified: Secondary | ICD-10-CM | POA: Insufficient documentation

## 2019-07-19 DIAGNOSIS — D46Z Other myelodysplastic syndromes: Secondary | ICD-10-CM

## 2019-07-19 DIAGNOSIS — D469 Myelodysplastic syndrome, unspecified: Secondary | ICD-10-CM | POA: Insufficient documentation

## 2019-07-19 LAB — BASIC METABOLIC PANEL
Anion gap: 9 (ref 5–15)
BUN: 25 mg/dL — ABNORMAL HIGH (ref 8–23)
CO2: 24 mmol/L (ref 22–32)
Calcium: 9 mg/dL (ref 8.9–10.3)
Chloride: 107 mmol/L (ref 98–111)
Creatinine, Ser: 0.88 mg/dL (ref 0.61–1.24)
GFR calc Af Amer: >60 mL/min (ref >60)
GFR calc non Af Amer: >60 mL/min (ref >60)
Glucose, Bld: 137 mg/dL — ABNORMAL HIGH (ref 70–99)
Potassium: 3.6 mmol/L (ref 3.5–5.1)
Sodium: 140 mmol/L (ref 135–145)

## 2019-07-19 LAB — IRON AND TIBC
Iron: 163 ug/dL (ref 45–182)
Saturation Ratios: 74 % — ABNORMAL HIGH (ref 17.9–39.5)
TIBC: 220 ug/dL — ABNORMAL LOW (ref 250–450)
UIBC: 57 ug/dL

## 2019-07-19 LAB — SAMPLE TO BLOOD BANK

## 2019-07-19 LAB — HEMATOCRIT: HCT: 19.3 % — ABNORMAL LOW (ref 39.0–52.0)

## 2019-07-19 LAB — FERRITIN: Ferritin: 1235 ng/mL — ABNORMAL HIGH (ref 24–336)

## 2019-07-19 LAB — PREPARE RBC (CROSSMATCH)

## 2019-07-19 LAB — HEMOGLOBIN: Hemoglobin: 6.5 g/dL — ABNORMAL LOW (ref 13.0–17.0)

## 2019-07-19 MED ORDER — EPOETIN ALFA-EPBX 10000 UNIT/ML IJ SOLN
20000.0000 [IU] | Freq: Once | INTRAMUSCULAR | Status: AC
  Administered 2019-07-19: 20000 [IU] via SUBCUTANEOUS
  Filled 2019-07-19: qty 2

## 2019-07-20 ENCOUNTER — Inpatient Hospital Stay: Payer: Medicare Other

## 2019-07-20 DIAGNOSIS — I129 Hypertensive chronic kidney disease with stage 1 through stage 4 chronic kidney disease, or unspecified chronic kidney disease: Secondary | ICD-10-CM | POA: Diagnosis not present

## 2019-07-20 DIAGNOSIS — D4620 Refractory anemia with excess of blasts, unspecified: Secondary | ICD-10-CM

## 2019-07-20 MED ORDER — DIPHENHYDRAMINE HCL 25 MG PO CAPS
25.0000 mg | ORAL_CAPSULE | Freq: Once | ORAL | Status: AC
  Administered 2019-07-20: 25 mg via ORAL
  Filled 2019-07-20: qty 1

## 2019-07-20 MED ORDER — ACETAMINOPHEN 325 MG PO TABS
650.0000 mg | ORAL_TABLET | Freq: Once | ORAL | Status: AC
  Administered 2019-07-20: 09:00:00 650 mg via ORAL
  Filled 2019-07-20: qty 2

## 2019-07-20 MED ORDER — SODIUM CHLORIDE 0.9% IV SOLUTION
250.0000 mL | Freq: Once | INTRAVENOUS | Status: AC
  Administered 2019-07-20: 09:00:00 250 mL via INTRAVENOUS
  Filled 2019-07-20: qty 250

## 2019-07-20 NOTE — Progress Notes (Signed)
Pt tolerated transfusion well. No s/s of distress or reaction noted. Pt and VS stable throughout transfusion and at discharge.

## 2019-07-21 LAB — BPAM RBC
Blood Product Expiration Date: 202105042359
ISSUE DATE / TIME: 202105040943
Unit Type and Rh: 9500

## 2019-07-21 LAB — TYPE AND SCREEN
ABO/RH(D): O POS
Antibody Screen: NEGATIVE
Unit division: 0

## 2019-07-22 ENCOUNTER — Other Ambulatory Visit: Payer: Self-pay

## 2019-07-22 ENCOUNTER — Encounter: Payer: Self-pay | Admitting: Nurse Practitioner

## 2019-07-22 ENCOUNTER — Other Ambulatory Visit: Payer: Medicare Other | Admitting: Nurse Practitioner

## 2019-07-22 DIAGNOSIS — D469 Myelodysplastic syndrome, unspecified: Secondary | ICD-10-CM

## 2019-07-22 DIAGNOSIS — Z515 Encounter for palliative care: Secondary | ICD-10-CM

## 2019-07-22 NOTE — Progress Notes (Signed)
Gerald [name]: [phone]  Fax: [phone]  PATIENT NAME: Gerald [name] DOB: [id number] MRN: [id number]  PRIMARY CARE PROVIDER:   [name], MD  REFERRING PROVIDER:  [name], MD [address] [facility] [place],  [place] [address]  RESPONSIBLE PARTY:   Gerald [name] [id number]  RECOMMENDATIONS and PLAN: 1.ACP: Full code, discussed at length, ongoing discussions about aggressive, conservative, comfort measures, extensive discussion about cpr, most form. Will revisit at next [facility] visit or sooner should family choose to change their decision  2.Palliative care encounter; Palliative medicine team will continue to support patient, patient's family, and medical team. Visit consisted of counseling and education dealing with the complex and emotionally intense issues of symptom management and palliative care in the setting of serious and potentially life-threatening illness  I spent 90 minutes providing this consultation,  from 1:45pm to 3:15pm. More than 50% of the time in this consultation was spent coordinating communication.   HISTORY OF PRESENT ILLNESS:  Gerald [name] is a 86 y.o. year old male with multiple medical problems including Myelodysplastic syndrome, testicular cancer s/p sgy, prostate cancer, history basal cell carcinoma, hemochromatosis, hypertension, anemia, appendectomy, cholecystectomy, bilateral hands surgery, kyphoplasty. I visited and observed Gerald [name] for scheduled in-person palliative care follow-up visit. Gerald [name]. We talked about purpose of palliative care visit. We talked about how Gerald [name] has been feeling. We talked about recent transfusion. We talked about Gerald [name] functional level. Gerald [name] is walking with his walker and toileting himself. We talked about his work with therapy. We talked about fatigue after transfusion  but then today seems to be a little bit better. We talked about reading place on his products that the CNA did see, very small piece of broken skin and reddened area. We talked about treatment options. We talked about chronic disease progression. We talked about medical goals of care at length. We talked about CPR most form. Many questions answered concerning medical goals of care options, aggressive versus conservative versus comfort care. Discussed at length importance of knowledge and information for effective decision-making. We talked about seven children and all being on a different page about how they feel about goals of care. We talked about Gerald [name] discussion that he would not want to have compressions. Gerald [name] endorses her conversation with Gerald [name] is that he would want to have that. We discuss different scenarios, including hospitalizations,  ICU, ventilators. Gerald [name] endorses they would prefer to wait for further discussion with family prior to making a decision. At present time Gerald [name] will remain a full code with aggressive interventions. We talked about role of palliative care and plan of care. I visited with Gerald [name]. We talked about purpose of palliative care visit. We talked about how he has been feeling. Blood pressure taken 138 / 70. We talked about his work with therapy, ambulating with the walker. No recent falls. We talked about the importance of fall risk and asking for assistance. We talked about symptoms of pain when she does describe at times with that red area on his backside. Gerald [name] endorses that currently he is not experiencing pain as positioning in his chair is important. We talked about shortness of breath what he does experience when he has been very fatigued. We talked about sleep. Gerald [name] does sleep fairly well although there are times when he has restless nights, typically after infusion. We talked about his appetite. Gerald [name]  endorses that  he does get a lot of food that is [name]'s to fix. He is fed well. We talked about medical goals. We talked about role of palliative care. We talked about next palliative care visit in 6 weeks if needed or sooner should he declined. Gerald [name], Gerald [name] in agreement and appointments scheduled. Discuss that if something should change their wish for further discussions can call and schedule an earlier appointment. Therapeutic listening and emotional support provided. Contact information provided. Questions answered to satisfaction.  Palliative Care was asked to help to continue to address goals of care.   CODE STATUS: Full code  PPS: 50% HOSPICE ELIGIBILITY/DIAGNOSIS: TBD  PAST MEDICAL HISTORY:  Past Medical History:  Diagnosis Date  . Anemia   . Atrial fibrillation ([place])   . Cancer [facility])    Testicular  . Hemochromatosis 10/25/2014  . Hx of basal cell carcinoma 2008   R. ant. zygomatic/sideburn 2008, L preauricular 2019  . Hypertension   . Prostate cancer [facility])     SOCIAL HX:  Social History   Tobacco Use  . Smoking status: Former Smoker    Types: Cigarettes    Quit date: 03/22/1958    Years since quitting: 61.3  . Smokeless tobacco: Never Used  Substance Use Topics  . Alcohol use: No    ALLERGIES:  Allergies  Allergen Reactions  . Levofloxacin Other (See Comments)    Taste alterations for about 1 month after taking med     PERTINENT MEDICATIONS:  Outpatient Encounter Medications as of 07/22/2019  Medication Sig  . acetaminophen (TYLENOL) 325 MG tablet Take 2 tablets (650 mg total) by mouth every 6 (six) hours.  [name] aspirin EC 81 MG tablet Take 81 mg by mouth once.   [name] CALCIUM-MAGNESUIUM-ZINC 333-133-8.3 MG TABS Take 1 tablet by mouth daily.  [name] docusate sodium (COLACE) 100 MG capsule Take 100 mg by mouth at bedtime.  . enoxaparin (LOVENOX) 40 MG/0.4ML injection Inject 0.4 mLs (40 mg total) into the skin daily. (Patient not taking: Reported on 06/21/2019)  . oxyCODONE (OXY  IR/ROXICODONE) 5 MG immediate release tablet Take 1 tablet (5 mg total) by mouth every 4 (four) hours as needed for severe pain.  [name] Potassium 99 MG TABS Take 1 tablet by mouth daily.   . traMADol (ULTRAM) 50 MG tablet Take 50 mg by mouth every 6 (six) hours as needed.  . Turmeric 500 MG TABS Take 500 mg by mouth at bedtime.  . vitamin B-12 (CYANOCOBALAMIN) 1000 MCG tablet Take 1,000 mcg by mouth daily.  . Vitamin D, Cholecalciferol, 1000 units CAPS Take 1,000 Units by mouth daily.   No facility-administered encounter medications on file as of 07/22/2019.    PHYSICAL EXAM:   General: NAD, frail appearing, thin, elderly pleasant, male Cardiovascular: regular rate and rhythm Pulmonary: clear ant fields Neurological: generalized weakness, walks with walker  Christin Ihor Gully, NP

## 2019-07-26 ENCOUNTER — Other Ambulatory Visit: Payer: Self-pay | Admitting: *Deleted

## 2019-07-26 ENCOUNTER — Telehealth: Payer: Self-pay | Admitting: *Deleted

## 2019-07-26 DIAGNOSIS — R0602 Shortness of breath: Secondary | ICD-10-CM

## 2019-07-26 DIAGNOSIS — D649 Anemia, unspecified: Secondary | ICD-10-CM

## 2019-07-26 NOTE — Telephone Encounter (Signed)
Per Dr. Jacinto Reap - pt needs to be evaluated in [facility]

## 2019-07-26 NOTE — Telephone Encounter (Signed)
Sounds good- We can see him in the morning with labs CBC-CMP and EKG.  Faythe Casa, NP 07/26/2019 3:49 PM

## 2019-07-26 NOTE — Telephone Encounter (Signed)
Jenny/Doni- please add HOLD tube for tomorrow's labs.GB

## 2019-07-26 NOTE — Telephone Encounter (Signed)
Ok, will do

## 2019-07-26 NOTE — Telephone Encounter (Signed)
Spoke to patient's daughter, [name]. She agreed to bring him in tomorrow morning for labs/ekg and evaluation at 10:00.

## 2019-07-26 NOTE — Telephone Encounter (Signed)
Daughter Gerald [name] called reporting that patient has been having increased shortness of breath since his blood transfusion last Tuesday as well as an irregular heart rate. She also reports that he remains tired. Please advise

## 2019-07-27 ENCOUNTER — Other Ambulatory Visit: Payer: Self-pay

## 2019-07-27 ENCOUNTER — Other Ambulatory Visit: Payer: Self-pay | Admitting: *Deleted

## 2019-07-27 ENCOUNTER — Inpatient Hospital Stay: Payer: Medicare Other

## 2019-07-27 ENCOUNTER — Inpatient Hospital Stay (HOSPITAL_BASED_OUTPATIENT_CLINIC_OR_DEPARTMENT_OTHER): Payer: Medicare Other | Admitting: Hospice and Palliative Medicine

## 2019-07-27 VITALS — BP 122/62 | HR 69 | Temp 96.9°F | Resp 20

## 2019-07-27 DIAGNOSIS — D649 Anemia, unspecified: Secondary | ICD-10-CM | POA: Diagnosis not present

## 2019-07-27 DIAGNOSIS — I499 Cardiac arrhythmia, unspecified: Secondary | ICD-10-CM | POA: Diagnosis not present

## 2019-07-27 DIAGNOSIS — D4620 Refractory anemia with excess of blasts, unspecified: Secondary | ICD-10-CM

## 2019-07-27 DIAGNOSIS — I129 Hypertensive chronic kidney disease with stage 1 through stage 4 chronic kidney disease, or unspecified chronic kidney disease: Secondary | ICD-10-CM | POA: Diagnosis not present

## 2019-07-27 DIAGNOSIS — R0602 Shortness of breath: Secondary | ICD-10-CM

## 2019-07-27 LAB — CBC WITH DIFFERENTIAL/PLATELET
Abs Immature Granulocytes: 0.03 10*3/uL (ref 0.00–0.07)
Basophils Absolute: 0.1 10*3/uL (ref 0.0–0.1)
Basophils Relative: 1 %
Eosinophils Absolute: 0.2 10*3/uL (ref 0.0–0.5)
Eosinophils Relative: 3 %
HCT: 22.5 % — ABNORMAL LOW (ref 39.0–52.0)
Hemoglobin: 7.5 g/dL — ABNORMAL LOW (ref 13.0–17.0)
Immature Granulocytes: 0 %
Lymphocytes Relative: 18 %
Lymphs Abs: 1.3 10*3/uL (ref 0.7–4.0)
MCH: 30.2 pg (ref 26.0–34.0)
MCHC: 33.3 g/dL (ref 30.0–36.0)
MCV: 90.7 fL (ref 80.0–100.0)
Monocytes Absolute: 0.8 10*3/uL (ref 0.1–1.0)
Monocytes Relative: 12 %
Neutro Abs: 4.5 10*3/uL (ref 1.7–7.7)
Neutrophils Relative %: 66 %
Platelets: 437 10*3/uL — ABNORMAL HIGH (ref 150–400)
RBC: 2.48 MIL/uL — ABNORMAL LOW (ref 4.22–5.81)
RDW: 22.5 % — ABNORMAL HIGH (ref 11.5–15.5)
WBC: 6.9 10*3/uL (ref 4.0–10.5)
nRBC: 0.4 % — ABNORMAL HIGH (ref 0.0–0.2)

## 2019-07-27 LAB — PREPARE RBC (CROSSMATCH)

## 2019-07-27 LAB — COMPREHENSIVE METABOLIC PANEL
ALT: 10 U/L (ref 0–44)
AST: 13 U/L — ABNORMAL LOW (ref 15–41)
Albumin: 4 g/dL (ref 3.5–5.0)
Alkaline Phosphatase: 52 U/L (ref 38–126)
Anion gap: 9 (ref 5–15)
BUN: 27 mg/dL — ABNORMAL HIGH (ref 8–23)
CO2: 24 mmol/L (ref 22–32)
Calcium: 9.3 mg/dL (ref 8.9–10.3)
Chloride: 108 mmol/L (ref 98–111)
Creatinine, Ser: 0.96 mg/dL (ref 0.61–1.24)
GFR calc Af Amer: >60 mL/min (ref >60)
GFR calc non Af Amer: >60 mL/min (ref >60)
Glucose, Bld: 121 mg/dL — ABNORMAL HIGH (ref 70–99)
Potassium: 3.6 mmol/L (ref 3.5–5.1)
Sodium: 141 mmol/L (ref 135–145)
Total Bilirubin: 1.6 mg/dL — ABNORMAL HIGH (ref 0.3–1.2)
Total Protein: 6.5 g/dL (ref 6.5–8.1)

## 2019-07-27 LAB — SAMPLE TO BLOOD BANK

## 2019-07-27 LAB — LACTATE DEHYDROGENASE: LDH: 153 U/L (ref 98–192)

## 2019-07-27 NOTE — Progress Notes (Signed)
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 fib not on anticoagulation due to falls, hypertension, history of prostate/testicular cancers, and hemochromatosis.  Patient was recently hospitalized 05/27/2019-06/01/2019 with a left femoral neck fracture secondary to mechanical fall.  He underwent left hip hemiarthroplasty on 3/12.  He had subsequent follow-up in the hematology clinic on 06/21/2019.  Hemoglobin was 8.7 and he received Retacrit.  Hemoglobin was 6.5 on 07/19/2019.  Patient received 1 unit PRBC on 07/20/2019.  He presents to the [facility] today with complaints of shortness of breath and fatigue.   Patient says that he had some shortness of breath following his transfusion last week.  He denied having any fever or chills.  No coughing or lower extremity swelling.  No wheezing.  He says that the symptoms have improved although he remains fatigued.  Daughter says that patient is requiring assistance with most of his daily care but that has been unchanged since his hip fracture.  Denies any neurologic complaints. Denies recent fevers or illnesses. Denies any easy bleeding or bruising. Reports good appetite and denies weight loss. Denies chest pain.  He reports persistent fatigue.  Denies any nausea, vomiting, constipation, or diarrhea. Denies urinary complaints. Patient offers no further specific complaints today.    PAST MEDICAL HISTORY: Past Medical History:  Diagnosis Date  . Anemia   . Atrial fibrillation ([place])   . Cancer [facility])    Testicular  . Hemochromatosis  10/25/2014  . Hx of basal cell carcinoma 2008   R. ant. zygomatic/sideburn 2008, L preauricular 2019  . Hypertension   . Prostate cancer ([place])     PAST SURGICAL HISTORY:  Past Surgical History:  Procedure Laterality Date  . ANTERIOR APPROACH HEMI HIP ARTHROPLASTY Left 05/28/2019   Procedure: ANTERIOR APPROACH HEMI HIP ARTHROPLASTY;  Surgeon: Hessie Knows, MD;  Location: [facility] ORS;  Service: Orthopedics;  Laterality: Left;  . APPENDECTOMY    . CHOLECYSTECTOMY    . HAND SURGERY Bilateral   . INNER EAR SURGERY    . KYPHOPLASTY N/A 12/14/2015   Procedure: KYPHOPLASTY  L2;  Surgeon: Hessie Knows, MD;  Location: [facility] ORS;  Service: Orthopedics;  Laterality: N/A;  . PROSTATE SURGERY    . SURGERY SCROTAL / TESTICULAR Right     HEMATOLOGY/ONCOLOGY HISTORY:  Oncology History  MDS (myelodysplastic syndrome), low grade ([place])  10/18/2016 Initial Diagnosis   MDS (myelodysplastic syndrome), low grade ([facility])     ALLERGIES:  is allergic to levofloxacin.  MEDICATIONS:  Current Outpatient Medications  Medication Sig Dispense Refill  . acetaminophen (TYLENOL) 325 MG tablet Take 2 tablets (650 mg total) by mouth every 6 (six) hours.    [name] aspirin EC 81 MG tablet Take 81 mg by mouth once.     [name] CALCIUM-MAGNESUIUM-ZINC 333-133-8.3 MG TABS Take 1 tablet by mouth daily.    [name] docusate sodium (COLACE) 100 MG capsule Take 100 mg by mouth at bedtime.    . enoxaparin (LOVENOX) 40 MG/0.4ML injection Inject 0.4 mLs (40 mg total) into the skin daily. (Patient not taking: Reported on 06/21/2019) 5.6 mL 0  . oxyCODONE (OXY IR/ROXICODONE) 5 MG immediate  release tablet Take 1 tablet (5 mg total) by mouth every 4 (four) hours as needed for severe pain. 42 tablet 0  . Potassium 99 MG TABS Take 1 tablet by mouth daily.     . traMADol (ULTRAM) 50 MG tablet Take 50 mg by mouth every 6 (six) hours as needed.    . Turmeric 500 MG TABS Take 500 mg by mouth at bedtime.    . vitamin B-12 (CYANOCOBALAMIN) 1000 MCG tablet Take  1,000 mcg by mouth daily.    . Vitamin D, Cholecalciferol, 1000 units CAPS Take 1,000 Units by mouth daily.     No current facility-administered medications for this visit.    VITAL SIGNS: There were no vitals taken for this visit. There were no vitals filed for this visit.  Estimated body mass index is 21.49 kg/m as calculated from the following:   Height as of 05/28/19: 5\' 9"  (1.753 m).   Weight as of 05/28/19: 145 lb 8.1 oz (66 kg).  LABS: CBC:    Component Value Date/Time   WBC 7.7 06/21/2019 1310   HGB 6.5 (L) 07/19/2019 1304   HGB 10.2 (L) 07/14/2014 1330   HCT 19.3 (L) 07/19/2019 1304   HCT 30.0 (L) 12/16/2013 1408   PLT 396 06/21/2019 1310   PLT 308 12/16/2013 1408   MCV 93.9 06/21/2019 1310   MCV 101 (H) 12/16/2013 1408   NEUTROABS 4.8 06/21/2019 1310   NEUTROABS 3.8 12/16/2013 1408   LYMPHSABS 1.6 06/21/2019 1310   LYMPHSABS 1.5 12/16/2013 1408   MONOABS 0.9 06/21/2019 1310   MONOABS 0.6 12/16/2013 1408   EOSABS 0.4 06/21/2019 1310   EOSABS 0.3 12/16/2013 1408   BASOSABS 0.1 06/21/2019 1310   BASOSABS 0.0 12/16/2013 1408   Comprehensive Metabolic Panel:    Component Value Date/Time   NA 140 07/19/2019 1328   NA 142 05/06/2013 0158   K 3.6 07/19/2019 1328   K 3.7 05/06/2013 0158   CL 107 07/19/2019 1328   CL 112 (H) 05/06/2013 0158   CO2 24 07/19/2019 1328   CO2 24 05/06/2013 0158   BUN 25 (H) 07/19/2019 1328   BUN 32 (H) 05/06/2013 0158   CREATININE 0.88 07/19/2019 1328   CREATININE 1.22 11/25/2013 1332   GLUCOSE 137 (H) 07/19/2019 1328   GLUCOSE 95 05/06/2013 0158   CALCIUM 9.0 07/19/2019 1328   CALCIUM 9.1 05/06/2013 0158   AST 20 06/21/2019 1310   AST 22 05/06/2013 0158   ALT 23 06/21/2019 1310   ALT 18 05/06/2013 0158   ALKPHOS 78 06/21/2019 1310   ALKPHOS 60 05/06/2013 0158   BILITOT 1.4 (H) 06/21/2019 1310   BILITOT 1.4 (H) 05/06/2013 0158   PROT 6.8 06/21/2019 1310   PROT 6.1 (L) 05/06/2013 0158   ALBUMIN 3.8 06/21/2019 1310   ALBUMIN  3.5 05/06/2013 0158    RADIOGRAPHIC STUDIES: No results found.  PERFORMANCE STATUS (ECOG) : 3 - Symptomatic, >50% confined to bed  Review of Systems Unless otherwise noted, a complete review of systems is negative.  Physical Exam General: NAD Cardiovascular: Irregular rate and rhythm Pulmonary: clear ant fields Abdomen: soft, nontender, + bowel sounds GU: no suprapubic tenderness Extremities: no edema, no joint deformities Skin: no rashes Neurological: Weakness but otherwise nonfocal  Assessment and Plan- Gerald [name] is a 86 year old man with multiple medical problems including chronic anemia secondary to CKD and MDS, A. fib, hypertension, prostate cancer, testicular cancer, and hemochromatosis.  Patient was recently hospitalized 05/27/2019-06/01/2019 with a left femoral neck  fracture secondary to mechanical fall.  He underwent left hip hemiarthroplasty on 3/12.  He had subsequent follow-up in the hematology clinic on 06/21/2019.  Hemoglobin was 8.7 and he received Retacrit.  Patient received 1 unit PRBC on 07/20/2019.  He presents to the [facility] today with complaints of shortness of breath and fatigue.   Symptomatic anemia - secondary to MDS.  Hemoglobin 7.5 today and up from 6.5 last week following his transfusion.  Suspect that his fatigue and shortness of breath are secondary to his anemia.  EKG checked and was without acute findings. Discussed with Dr. Rogue Bussing, who also spoke with the patient/daughter.  Patient will receive transfusion of PRBC tomorrow. Recommend referral to palliative care.   Patient expressed understanding and was in agreement with this plan. He also understands that He can call clinic at any time with any questions, concerns, or complaints.   Thank you for allowing me to participate in the care of this very pleasant patient.   Time Total: 20 minutes  Visit consisted of counseling and education dealing with the complex and emotionally intense issues of  symptom management and palliative care in the setting of serious and potentially life-threatening illness.Greater than 50%  of this time was spent counseling and coordinating care related to the above assessment and plan.  Signed by: Altha Harm, PhD, NP-C

## 2019-07-28 ENCOUNTER — Inpatient Hospital Stay: Payer: Medicare Other

## 2019-07-28 ENCOUNTER — Encounter: Payer: Self-pay | Admitting: *Deleted

## 2019-07-28 DIAGNOSIS — D649 Anemia, unspecified: Secondary | ICD-10-CM

## 2019-07-28 DIAGNOSIS — I129 Hypertensive chronic kidney disease with stage 1 through stage 4 chronic kidney disease, or unspecified chronic kidney disease: Secondary | ICD-10-CM | POA: Diagnosis not present

## 2019-07-28 DIAGNOSIS — D4620 Refractory anemia with excess of blasts, unspecified: Secondary | ICD-10-CM

## 2019-07-28 MED ORDER — DIPHENHYDRAMINE HCL 25 MG PO CAPS
25.0000 mg | ORAL_CAPSULE | Freq: Once | ORAL | Status: AC
  Administered 2019-07-28: 10:00:00 25 mg via ORAL
  Filled 2019-07-28: qty 1

## 2019-07-28 MED ORDER — SODIUM CHLORIDE 0.9% IV SOLUTION
250.0000 mL | Freq: Once | INTRAVENOUS | Status: AC
  Administered 2019-07-28: 10:00:00 250 mL via INTRAVENOUS
  Filled 2019-07-28: qty 250

## 2019-07-28 MED ORDER — ACETAMINOPHEN 325 MG PO TABS
650.0000 mg | ORAL_TABLET | Freq: Once | ORAL | Status: AC
  Administered 2019-07-28: 650 mg via ORAL
  Filled 2019-07-28: qty 2

## 2019-07-29 LAB — BPAM RBC
Blood Product Expiration Date: 202106082359
ISSUE DATE / TIME: 202105121003
Unit Type and Rh: 5100

## 2019-07-29 LAB — TYPE AND SCREEN
ABO/RH(D): O POS
Antibody Screen: NEGATIVE
Unit division: 0

## 2019-08-20 ENCOUNTER — Other Ambulatory Visit: Payer: Self-pay | Admitting: *Deleted

## 2019-08-20 DIAGNOSIS — D649 Anemia, unspecified: Secondary | ICD-10-CM

## 2019-08-20 DIAGNOSIS — D4620 Refractory anemia with excess of blasts, unspecified: Secondary | ICD-10-CM

## 2019-08-23 ENCOUNTER — Encounter: Payer: Self-pay | Admitting: Internal Medicine

## 2019-08-23 ENCOUNTER — Other Ambulatory Visit: Payer: Self-pay

## 2019-08-23 ENCOUNTER — Inpatient Hospital Stay (HOSPITAL_BASED_OUTPATIENT_CLINIC_OR_DEPARTMENT_OTHER): Payer: Medicare Other | Admitting: Internal Medicine

## 2019-08-23 ENCOUNTER — Other Ambulatory Visit: Payer: Self-pay | Admitting: *Deleted

## 2019-08-23 ENCOUNTER — Inpatient Hospital Stay: Payer: Medicare Other

## 2019-08-23 ENCOUNTER — Inpatient Hospital Stay: Payer: Medicare Other | Attending: Internal Medicine

## 2019-08-23 DIAGNOSIS — Z7982 Long term (current) use of aspirin: Secondary | ICD-10-CM | POA: Diagnosis not present

## 2019-08-23 DIAGNOSIS — D631 Anemia in chronic kidney disease: Secondary | ICD-10-CM | POA: Diagnosis present

## 2019-08-23 DIAGNOSIS — I10 Essential (primary) hypertension: Secondary | ICD-10-CM | POA: Insufficient documentation

## 2019-08-23 DIAGNOSIS — D4620 Refractory anemia with excess of blasts, unspecified: Secondary | ICD-10-CM

## 2019-08-23 DIAGNOSIS — Z85828 Personal history of other malignant neoplasm of skin: Secondary | ICD-10-CM | POA: Insufficient documentation

## 2019-08-23 DIAGNOSIS — Z79899 Other long term (current) drug therapy: Secondary | ICD-10-CM | POA: Insufficient documentation

## 2019-08-23 DIAGNOSIS — Z8546 Personal history of malignant neoplasm of prostate: Secondary | ICD-10-CM | POA: Diagnosis not present

## 2019-08-23 DIAGNOSIS — I4891 Unspecified atrial fibrillation: Secondary | ICD-10-CM | POA: Insufficient documentation

## 2019-08-23 DIAGNOSIS — Z87891 Personal history of nicotine dependence: Secondary | ICD-10-CM | POA: Insufficient documentation

## 2019-08-23 DIAGNOSIS — D649 Anemia, unspecified: Secondary | ICD-10-CM

## 2019-08-23 DIAGNOSIS — N189 Chronic kidney disease, unspecified: Secondary | ICD-10-CM | POA: Insufficient documentation

## 2019-08-23 LAB — CBC WITH DIFFERENTIAL/PLATELET
Abs Immature Granulocytes: 0.03 10*3/uL (ref 0.00–0.07)
Basophils Absolute: 0.1 10*3/uL (ref 0.0–0.1)
Basophils Relative: 1 %
Eosinophils Absolute: 0.4 10*3/uL (ref 0.0–0.5)
Eosinophils Relative: 6 %
HCT: 21.2 % — ABNORMAL LOW (ref 39.0–52.0)
Hemoglobin: 7 g/dL — ABNORMAL LOW (ref 13.0–17.0)
Immature Granulocytes: 0 %
Lymphocytes Relative: 21 %
Lymphs Abs: 1.4 10*3/uL (ref 0.7–4.0)
MCH: 30.3 pg (ref 26.0–34.0)
MCHC: 33 g/dL (ref 30.0–36.0)
MCV: 91.8 fL (ref 80.0–100.0)
Monocytes Absolute: 0.9 10*3/uL (ref 0.1–1.0)
Monocytes Relative: 13 %
Neutro Abs: 3.9 10*3/uL (ref 1.7–7.7)
Neutrophils Relative %: 59 %
Platelets: 397 10*3/uL (ref 150–400)
RBC: 2.31 MIL/uL — ABNORMAL LOW (ref 4.22–5.81)
RDW: 23.1 % — ABNORMAL HIGH (ref 11.5–15.5)
WBC: 6.7 10*3/uL (ref 4.0–10.5)
nRBC: 0.3 % — ABNORMAL HIGH (ref 0.0–0.2)

## 2019-08-23 LAB — COMPREHENSIVE METABOLIC PANEL
ALT: 10 U/L (ref 0–44)
AST: 14 U/L — ABNORMAL LOW (ref 15–41)
Albumin: 4.2 g/dL (ref 3.5–5.0)
Alkaline Phosphatase: 48 U/L (ref 38–126)
Anion gap: 10 (ref 5–15)
BUN: 35 mg/dL — ABNORMAL HIGH (ref 8–23)
CO2: 25 mmol/L (ref 22–32)
Calcium: 9.3 mg/dL (ref 8.9–10.3)
Chloride: 109 mmol/L (ref 98–111)
Creatinine, Ser: 1 mg/dL (ref 0.61–1.24)
GFR calc Af Amer: >60 mL/min (ref >60)
GFR calc non Af Amer: >60 mL/min (ref >60)
Glucose, Bld: 145 mg/dL — ABNORMAL HIGH (ref 70–99)
Potassium: 3.9 mmol/L (ref 3.5–5.1)
Sodium: 144 mmol/L (ref 135–145)
Total Bilirubin: 1.8 mg/dL — ABNORMAL HIGH (ref 0.3–1.2)
Total Protein: 6.5 g/dL (ref 6.5–8.1)

## 2019-08-23 LAB — SAMPLE TO BLOOD BANK

## 2019-08-23 LAB — PREPARE RBC (CROSSMATCH)

## 2019-08-23 MED ORDER — EPOETIN ALFA-EPBX 10000 UNIT/ML IJ SOLN
20000.0000 [IU] | Freq: Once | INTRAMUSCULAR | Status: AC
  Administered 2019-08-23: 20000 [IU] via SUBCUTANEOUS
  Filled 2019-08-23: qty 2

## 2019-08-23 NOTE — Assessment & Plan Note (Addendum)
#  Anemia ?  Likely secondary to MDS. [no previous BMBx]- Today hemoglobin is 7.0. s/p PRBC transfusion. Declines Bone marrow Biopsy.  Proceed with Retacrit.  We will plan PRBC transfusions 1 to 2 days. 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  Stable December 2020-iron saturation 36%.  #I spoke to patient's daughter  regarding above plan.  She is in agreement.  # DISPOSITION:  # Retacrit today # 1 unit PRBC transfusion; Hold tube # in 3 weeks; H&H; Hold tube; Retacrit/ 1 unit PRBC transfusion # Follow up in 6 weeks MD; H&H; HOLD tube; -possible retacrit/1 unit PRBC transfusion -Dr.B

## 2019-08-23 NOTE — Progress Notes (Signed)
[place] OFFICE PROGRESS NOTE  Patient Care Team: [name], MD as PCP - General (Family Medicine) Cammie Sickle, MD as Consulting Physician (Internal Medicine)   SUMMARY OF HEMATOLOGIC HISTORY:  # Anemia sec to ? CKD  Macrocytic ? MDS- no BMBx] on Procrit 20000 Units q ? 34M; on aranesp every 2 months [poor to tolerance/fatigue- ] FEB 2021- switch to retacrit   # ? Hemochromatosis- heterozygous [ferritin 800-900s] - on surveillance.   INTERVAL HISTORY:   A very pleasant 86 year-old male patient who is fairly active for his age currently on procrit for anemia secondary to CKD/MDS is here for follow-up.  Patient is having difficulty undergoing physical therapy because of extreme fatigue.  Also in the interim received 1 unit of PRBC transfusion.  Today's hemoglobin is 7.0.  He feels tired.  Short of breath with exertion.  Appetite fair at best.   Review of Systems  Constitutional: Positive for malaise/fatigue. Negative for chills, diaphoresis, fever and weight loss.  HENT: Negative for nosebleeds and sore throat.   Eyes: Negative for double vision.  Respiratory: Negative for cough, hemoptysis, sputum production, shortness of breath and wheezing.   Cardiovascular: Negative for chest pain, palpitations, orthopnea and leg swelling.  Gastrointestinal: Negative for abdominal pain, blood in stool, constipation, diarrhea, heartburn, melena, nausea and vomiting.  Genitourinary: Negative for dysuria, frequency and urgency.  Musculoskeletal: Positive for back pain. Negative for joint pain.  Skin: Negative.  Negative for itching and rash.  Neurological: Positive for dizziness. Negative for tingling, focal weakness, weakness and headaches.  Endo/Heme/Allergies: Does not bruise/bleed easily.  Psychiatric/Behavioral: Negative for depression. The patient is not nervous/anxious and does not have insomnia.      PAST MEDICAL HISTORY :  Past Medical History:   Diagnosis Date  . Anemia   . Atrial fibrillation ([place])   . Cancer [facility])    Testicular  . Hemochromatosis 10/25/2014  . Hx of basal cell carcinoma 2008   R. ant. zygomatic/sideburn 2008, L preauricular 2019  . Hypertension   . Prostate cancer ([place])     PAST SURGICAL HISTORY :   Past Surgical History:  Procedure Laterality Date  . ANTERIOR APPROACH HEMI HIP ARTHROPLASTY Left 05/28/2019   Procedure: ANTERIOR APPROACH HEMI HIP ARTHROPLASTY;  Surgeon: Hessie Knows, MD;  Location: [facility] ORS;  Service: Orthopedics;  Laterality: Left;  . APPENDECTOMY    . CHOLECYSTECTOMY    . HAND SURGERY Bilateral   . INNER EAR SURGERY    . KYPHOPLASTY N/A 12/14/2015   Procedure: KYPHOPLASTY  L2;  Surgeon: Hessie Knows, MD;  Location: [facility] ORS;  Service: Orthopedics;  Laterality: N/A;  . PROSTATE SURGERY    . SURGERY SCROTAL / TESTICULAR Right     FAMILY HISTORY :   Family History  Problem Relation Age of Onset  . Hypertension Mother     SOCIAL HISTORY:   Social History   Tobacco Use  . Smoking status: Former Smoker    Types: Cigarettes    Quit date: 03/22/1958    Years since quitting: 61.4  . Smokeless tobacco: Never Used  Substance Use Topics  . Alcohol use: No  . Drug use: No    ALLERGIES:  is allergic to levofloxacin.  MEDICATIONS:  Current Outpatient Medications  Medication Sig Dispense Refill  . acetaminophen (TYLENOL) 325 MG tablet Take 2 tablets (650 mg total) by mouth every 6 (six) hours.    [name] aspirin EC 81 MG tablet Take 81 mg by mouth once.     [name]  CALCIUM-MAGNESUIUM-ZINC 333-133-8.3 MG TABS Take 1 tablet by mouth daily.    [name] docusate sodium (COLACE) 100 MG capsule Take 100 mg by mouth at bedtime.    [name] oxyCODONE (OXY IR/ROXICODONE) 5 MG immediate release tablet Take 1 tablet (5 mg total) by mouth every 4 (four) hours as needed for severe pain. 42 tablet 0  . Potassium 99 MG TABS Take 1 tablet by mouth daily.     . traMADol (ULTRAM) 50 MG tablet Take 50 mg by mouth every 6 (six)  hours as needed.    . Turmeric 500 MG TABS Take 500 mg by mouth at bedtime.    . vitamin B-12 (CYANOCOBALAMIN) 1000 MCG tablet Take 1,000 mcg by mouth daily.    . Vitamin D, Cholecalciferol, 1000 units CAPS Take 1,000 Units by mouth daily.     No current facility-administered medications for this visit.    PHYSICAL EXAMINATION:   BP 134/67   Pulse 78   Temp 97.8 F (36.6 C)   Resp 20   Wt 144 lb (65.3 kg)   SpO2 96%   BMI 21.27 kg/m   Filed Weights   08/23/19 1306  Weight: 144 lb (65.3 kg)    Physical Exam  Constitutional: He is oriented to person, place, and time.  Elderly male patient; he is in a wheelchair.  He is accompanied by his daughter.  Looks younger than his stated age.  HENT:  Head: Normocephalic and atraumatic.  Mouth/Throat: Oropharynx is clear and moist. No oropharyngeal exudate.  Eyes: Pupils are equal, round, and reactive to light.  Cardiovascular: Normal rate and regular rhythm.  Pulmonary/Chest: Effort normal and breath sounds normal. No respiratory distress. He has no wheezes.  Abdominal: Soft. Bowel sounds are normal. He exhibits no distension and no mass. There is no abdominal tenderness. There is no rebound and no guarding.  Musculoskeletal:        General: No tenderness or edema. Normal range of motion.     Cervical back: Normal range of motion and neck supple.  Neurological: He is alert and oriented to person, place, and time.  Skin: Skin is warm.  Psychiatric: Affect normal.     LABORATORY DATA:  I have reviewed the data as listed    Component Value Date/Time   NA 144 08/23/2019 1252   NA 142 05/06/2013 0158   K 3.9 08/23/2019 1252   K 3.7 05/06/2013 0158   CL 109 08/23/2019 1252   CL 112 (H) 05/06/2013 0158   CO2 25 08/23/2019 1252   CO2 24 05/06/2013 0158   GLUCOSE 145 (H) 08/23/2019 1252   GLUCOSE 95 05/06/2013 0158   BUN 35 (H) 08/23/2019 1252   BUN 32 (H) 05/06/2013 0158   CREATININE 1.00 08/23/2019 1252   CREATININE 1.22  11/25/2013 1332   CALCIUM 9.3 08/23/2019 1252   CALCIUM 9.1 05/06/2013 0158   PROT 6.5 08/23/2019 1252   PROT 6.1 (L) 05/06/2013 0158   ALBUMIN 4.2 08/23/2019 1252   ALBUMIN 3.5 05/06/2013 0158   AST 14 (L) 08/23/2019 1252   AST 22 05/06/2013 0158   ALT 10 08/23/2019 1252   ALT 18 05/06/2013 0158   ALKPHOS 48 08/23/2019 1252   ALKPHOS 60 05/06/2013 0158   BILITOT 1.8 (H) 08/23/2019 1252   BILITOT 1.4 (H) 05/06/2013 0158   GFRNONAA >60 08/23/2019 1252   GFRNONAA 52 (L) 11/25/2013 1332   GFRAA >60 08/23/2019 1252   GFRAA >60 11/25/2013 1332    No results found for: SPEP,  UPEP  Lab Results  Component Value Date   WBC 6.7 08/23/2019   NEUTROABS 3.9 08/23/2019   HGB 7.0 (L) 08/23/2019   HCT 21.2 (L) 08/23/2019   MCV 91.8 08/23/2019   PLT 397 08/23/2019      Chemistry      Component Value Date/Time   NA 144 08/23/2019 1252   NA 142 05/06/2013 0158   K 3.9 08/23/2019 1252   K 3.7 05/06/2013 0158   CL 109 08/23/2019 1252   CL 112 (H) 05/06/2013 0158   CO2 25 08/23/2019 1252   CO2 24 05/06/2013 0158   BUN 35 (H) 08/23/2019 1252   BUN 32 (H) 05/06/2013 0158   CREATININE 1.00 08/23/2019 1252   CREATININE 1.22 11/25/2013 1332      Component Value Date/Time   CALCIUM 9.3 08/23/2019 1252   CALCIUM 9.1 05/06/2013 0158   ALKPHOS 48 08/23/2019 1252   ALKPHOS 60 05/06/2013 0158   AST 14 (L) 08/23/2019 1252   AST 22 05/06/2013 0158   ALT 10 08/23/2019 1252   ALT 18 05/06/2013 0158   BILITOT 1.8 (H) 08/23/2019 1252   BILITOT 1.4 (H) 05/06/2013 0158       RADIOGRAPHIC STUDIES: I have personally reviewed the radiological images as listed and agreed with the findings in the report. No results found.   ASSESSMENT & PLAN:   MDS (myelodysplastic syndrome), low grade ([facility]) #  Anemia ?  Likely secondary to MDS. [no previous BMBx]- Today hemoglobin is 7.0. s/p PRBC transfusion. Declines Bone marrow Biopsy.  Proceed with Retacrit.   # Hemochromatosis- Heterozygous-no  evidence of end organ dysfunction.  Stable December 2020-iron saturation 36%.  #I spoke to patient's daughter  regarding above plan.  She is in agreement.  # DISPOSITION:  # Retacrit today # 1 unit PRBC transfusion; Hold tube # in 3 weeks; H&H; Hold tube; Retacrit/ 1 unit PRBC transfusion # Follow up in 6 weeks MD; H&H; HOLD tube; -possible retacrit/1 unit PRBC transfusion -Dr.B     Cammie Sickle, MD 08/23/2019 2:09 PM

## 2019-08-24 ENCOUNTER — Inpatient Hospital Stay: Payer: Medicare Other

## 2019-08-24 DIAGNOSIS — D649 Anemia, unspecified: Secondary | ICD-10-CM

## 2019-08-24 DIAGNOSIS — D4620 Refractory anemia with excess of blasts, unspecified: Secondary | ICD-10-CM

## 2019-08-24 DIAGNOSIS — N189 Chronic kidney disease, unspecified: Secondary | ICD-10-CM | POA: Diagnosis not present

## 2019-08-24 MED ORDER — DIPHENHYDRAMINE HCL 25 MG PO CAPS
25.0000 mg | ORAL_CAPSULE | Freq: Once | ORAL | Status: AC
  Administered 2019-08-24: 25 mg via ORAL
  Filled 2019-08-24: qty 1

## 2019-08-24 MED ORDER — SODIUM CHLORIDE 0.9% IV SOLUTION
250.0000 mL | Freq: Once | INTRAVENOUS | Status: AC
  Administered 2019-08-24: 250 mL via INTRAVENOUS
  Filled 2019-08-24: qty 250

## 2019-08-24 MED ORDER — ACETAMINOPHEN 325 MG PO TABS
650.0000 mg | ORAL_TABLET | Freq: Once | ORAL | Status: AC
  Administered 2019-08-24: 650 mg via ORAL
  Filled 2019-08-24: qty 2

## 2019-08-25 LAB — TYPE AND SCREEN
ABO/RH(D): O POS
Antibody Screen: NEGATIVE
Unit division: 0

## 2019-08-25 LAB — BPAM RBC
Blood Product Expiration Date: 202107042359
ISSUE DATE / TIME: 202106080935
Unit Type and Rh: 5100

## 2019-09-01 ENCOUNTER — Other Ambulatory Visit: Payer: Self-pay

## 2019-09-01 ENCOUNTER — Ambulatory Visit (INDEPENDENT_AMBULATORY_CARE_PROVIDER_SITE_OTHER): Payer: Medicare Other | Admitting: Dermatology

## 2019-09-01 DIAGNOSIS — L905 Scar conditions and fibrosis of skin: Secondary | ICD-10-CM

## 2019-09-01 DIAGNOSIS — Z85828 Personal history of other malignant neoplasm of skin: Secondary | ICD-10-CM | POA: Diagnosis not present

## 2019-09-01 NOTE — Progress Notes (Signed)
   Follow-Up Visit   Subjective  Gerald [name] is a 86 y.o. male who presents for the following: Skin Cancer (4 months f/u BCC L preauricular removed in 2019, pt c/o tender scar tissue, irritated by shaving ).  Daughter with pt and gives history. The pt has had a decline in health with broken hip recently.   The following portions of the chart were reviewed this encounter and updated as appropriate:  Tobacco  Allergies  Meds  Problems  Med Hx  Surg Hx  Fam Hx      Review of Systems:  No other skin or systemic complaints except as noted in HPI or Assessment and Plan.  Objective  Well appearing patient in no apparent distress; mood and affect are within normal limits.  A focused examination was performed including face . Relevant physical exam findings are noted in the Assessment and Plan.  Objective  L preauricular: Well healed scar with no evidence of recurrence.   Objective  L preauricular: Pink puffy scar tissue    Assessment & Plan    History of basal cell carcinoma (BCC) L preauricular  Observe   Scar, hypertrophic and symptomatic L preauricular  Intralesional injection - L preauricular Location: L preauricular  Informed Consent: Discussed risks (infection, pain, bleeding, bruising, thinning of the skin, loss of skin pigment, lack of resolution, and recurrence of lesion) and benefits of the procedure, as well as the alternatives. Informed consent was obtained. Preparation: The area was prepared a standard fashion.  Anesthesia:none  Procedure Details: An intralesional injection was performed with Kenalog 40 mg/cc. 0.1 cc in total were injected.  Total number of injections: 4  Plan: The patient was instructed on post-op care. Recommend OTC analgesia as needed for pain.   Return if symptoms worsen or fail to improve. IMarye Round, CMA, am acting as scribe for Sarina Ser, MD .  Documentation: I have reviewed the above documentation for  accuracy and completeness, and I agree with the above.  Sarina Ser, MD

## 2019-09-05 ENCOUNTER — Encounter: Payer: Self-pay | Admitting: Dermatology

## 2019-09-13 ENCOUNTER — Other Ambulatory Visit: Payer: Self-pay | Admitting: *Deleted

## 2019-09-13 ENCOUNTER — Other Ambulatory Visit: Payer: Self-pay | Admitting: Internal Medicine

## 2019-09-13 ENCOUNTER — Inpatient Hospital Stay: Payer: Medicare Other

## 2019-09-13 ENCOUNTER — Other Ambulatory Visit: Payer: Self-pay

## 2019-09-13 DIAGNOSIS — D4620 Refractory anemia with excess of blasts, unspecified: Secondary | ICD-10-CM

## 2019-09-13 DIAGNOSIS — N189 Chronic kidney disease, unspecified: Secondary | ICD-10-CM | POA: Diagnosis not present

## 2019-09-13 LAB — CBC WITH DIFFERENTIAL/PLATELET
Abs Immature Granulocytes: 0.02 10*3/uL (ref 0.00–0.07)
Basophils Absolute: 0.1 10*3/uL (ref 0.0–0.1)
Basophils Relative: 1 %
Eosinophils Absolute: 0.4 10*3/uL (ref 0.0–0.5)
Eosinophils Relative: 8 %
HCT: 22.4 % — ABNORMAL LOW (ref 39.0–52.0)
Hemoglobin: 7.3 g/dL — ABNORMAL LOW (ref 13.0–17.0)
Immature Granulocytes: 0 %
Lymphocytes Relative: 20 %
Lymphs Abs: 1.1 10*3/uL (ref 0.7–4.0)
MCH: 30.8 pg (ref 26.0–34.0)
MCHC: 32.6 g/dL (ref 30.0–36.0)
MCV: 94.5 fL (ref 80.0–100.0)
Monocytes Absolute: 0.7 10*3/uL (ref 0.1–1.0)
Monocytes Relative: 13 %
Neutro Abs: 3.2 10*3/uL (ref 1.7–7.7)
Neutrophils Relative %: 58 %
Platelets: 378 10*3/uL (ref 150–400)
RBC: 2.37 MIL/uL — ABNORMAL LOW (ref 4.22–5.81)
RDW: 23.5 % — ABNORMAL HIGH (ref 11.5–15.5)
WBC: 5.6 10*3/uL (ref 4.0–10.5)
nRBC: 0 % (ref 0.0–0.2)

## 2019-09-13 LAB — COMPREHENSIVE METABOLIC PANEL
ALT: 10 U/L (ref 0–44)
AST: 13 U/L — ABNORMAL LOW (ref 15–41)
Albumin: 4 g/dL (ref 3.5–5.0)
Alkaline Phosphatase: 51 U/L (ref 38–126)
Anion gap: 9 (ref 5–15)
BUN: 33 mg/dL — ABNORMAL HIGH (ref 8–23)
CO2: 26 mmol/L (ref 22–32)
Calcium: 9 mg/dL (ref 8.9–10.3)
Chloride: 108 mmol/L (ref 98–111)
Creatinine, Ser: 0.97 mg/dL (ref 0.61–1.24)
GFR calc Af Amer: >60 mL/min (ref >60)
GFR calc non Af Amer: >60 mL/min (ref >60)
Glucose, Bld: 135 mg/dL — ABNORMAL HIGH (ref 70–99)
Potassium: 3.3 mmol/L — ABNORMAL LOW (ref 3.5–5.1)
Sodium: 143 mmol/L (ref 135–145)
Total Bilirubin: 1.7 mg/dL — ABNORMAL HIGH (ref 0.3–1.2)
Total Protein: 6.4 g/dL — ABNORMAL LOW (ref 6.5–8.1)

## 2019-09-13 LAB — PREPARE RBC (CROSSMATCH)

## 2019-09-13 LAB — SAMPLE TO BLOOD BANK

## 2019-09-13 MED ORDER — SODIUM CHLORIDE 0.9% IV SOLUTION
250.0000 mL | Freq: Once | INTRAVENOUS | Status: AC
  Administered 2019-09-13: 250 mL via INTRAVENOUS
  Filled 2019-09-13: qty 250

## 2019-09-13 MED ORDER — FUROSEMIDE 10 MG/ML IJ SOLN
20.0000 mg | Freq: Once | INTRAMUSCULAR | Status: AC
  Administered 2019-09-13: 20 mg via INTRAVENOUS
  Filled 2019-09-13: qty 2

## 2019-09-13 MED ORDER — ACETAMINOPHEN 325 MG PO TABS
650.0000 mg | ORAL_TABLET | Freq: Once | ORAL | Status: AC
  Administered 2019-09-13: 650 mg via ORAL
  Filled 2019-09-13: qty 2

## 2019-09-14 LAB — BPAM RBC
Blood Product Expiration Date: 202107282359
ISSUE DATE / TIME: 202106281032
Unit Type and Rh: 5100

## 2019-09-14 LAB — TYPE AND SCREEN
ABO/RH(D): O POS
Antibody Screen: NEGATIVE
Unit division: 0

## 2019-09-20 ENCOUNTER — Other Ambulatory Visit: Payer: Medicare Other | Admitting: Nurse Practitioner

## 2019-09-20 ENCOUNTER — Other Ambulatory Visit: Payer: Self-pay

## 2019-09-24 ENCOUNTER — Encounter: Payer: Self-pay | Admitting: Nurse Practitioner

## 2019-09-24 ENCOUNTER — Other Ambulatory Visit: Payer: Medicare Other | Admitting: Nurse Practitioner

## 2019-09-24 ENCOUNTER — Other Ambulatory Visit: Payer: Self-pay

## 2019-09-24 DIAGNOSIS — D469 Myelodysplastic syndrome, unspecified: Secondary | ICD-10-CM

## 2019-09-24 DIAGNOSIS — Z515 Encounter for palliative care: Secondary | ICD-10-CM

## 2019-09-24 NOTE — Progress Notes (Signed)
[facility] Consult Note Telephone: [phone]  Fax: [phone]  PATIENT NAME: Gerald [name] DOB: [id number] MRN: [id number]  PRIMARY CARE PROVIDER:   [name], MD  REFERRING PROVIDER:  [name], MD [address] [facility] [place],  [place] [address]  RESPONSIBLE [place] daughter [phone]  RECOMMENDATIONS and PLAN: 1.QQI:WLNL code, discussed at length, ongoing discussions about aggressive, conservative, comfort measures, extensive discussion about cpr, most form. Will revisit at next [facility] visit or sooner should family choose to change their decision  2.Palliative care encounter; Palliative medicine team will continue to support patient, patient's family, and medical team. Visit consisted of counseling and education dealing with the complex and emotionally intense issues of symptom management and palliative care in the setting of serious and potentially life-threatening illness  I spent 60 minutes providing this consultation,  from 12:00pm to 1:00pm. More than 50% of the time in this consultation was spent coordinating communication.   HISTORY OF PRESENT ILLNESS:  Gerald [name] is a 86 y.o. year old male with multiple medical problems including Myelodysplastic syndrome, testicular cancer s/p sgy, prostate cancer, history basal cell carcinoma, hemochromatosis, hypertension, anemia, appendectomy, cholecystectomy, bilateral hands surgery, kyphoplasty. Face-to-face follow-up palliative care visit with Gerald [name], daughters including Gerald [name]. We talked about how Gerald [name] has been feeling. Gerald [name] endorses that he is doing okay. Gerald [name] deny symptoms. Gerald [name] was cooperative with the assessment those taking naps during palliative care visit. We talked about last visit with Dr Rogue [name], Oncology and received a transfusion with no Retacrit. Confirmed with Dr Rogue [name].  We talked about medical goals of care. We talked about for transfusions that he has already received. We talked about should have get to the point where he requires more frequent transfusions or if he becomes too weak or is exhaust him to get him to as an appointment what the next step would be. Encourage family to further discuss with Dr Rogue [name] what that would look like in options available. We talked about his blood pressures which have been relatively normal since hospitalization when blood pressure medications had been stopped. Over the last several days his systolic has been in the 892J. Discuss best plan would be to continue to monitor blood pressures daily at the same time to see if there is a trend for further elevation and if a low-dose antihypertensive may be needed. We talked about the caution and concern in a 86 year old man that does have MDS with anemia and antihypertensive medication. Gerald [name] endorses he has an upcoming appointment with Dr Rogue [name]. Family will bring blood pressures to review. We talked about Dr Kary [name] Primary Care Provider and calling for appointment even if telemedicine to update on blood pressure. Family asked about difference between Palliative Care and [facility] which is coming to an end. We talked about Home Health, Hospice agencies are hands-on nursing services that provide in-home hands-on care. We talked about Palliative Care being Provider level Physicians and Nurse Practitioners providing goals of care, symptom management on a provider level. We talked about Palliative Care does have a Education officer, museum that is involved that can help with needs such as needs, long term planning, supportive role but does not have cna service or nurses. We talked about when [facility] stop service that a need would be a CNA to come do a bath. We talked about private duty nursing. Discussed will ask Palliative Care Social Worker to call for consult concerning option  of resources  available for private duty. We talked about the family dynamics with sisters spending a lot of time rotating in the caregiving of Gerald [name] . We talked about Hospice being a Medicare benefit and Hospice eligibility. We talked about what services are provided. We talked about at this point in with Gerald [name] receiving infusions that he would not be eligible for Hospice Services. We talked about Hospice Philosophy. We talked about role of palliative care and plan of care. Discuss that will follow up with palliative care visit in 6 weeks if needed or sooner should he declined. Family in agreement and appointment scheduled. Therapeutic listening and emotional support provided. Contact information. Questions answered is satisfaction. Palliative Care was asked to help to continue to address goals of care.   CODE STATUS: Full code  PPS: 40% HOSPICE ELIGIBILITY/DIAGNOSIS: TBD  PAST MEDICAL HISTORY:  Past Medical History:  Diagnosis Date  . Anemia   . Atrial fibrillation ([place])   . Cancer [facility])    Testicular  . Hemochromatosis 10/25/2014  . Hx of basal cell carcinoma 2008   R. ant. zygomatic/sideburn 2008, L preauricular 2019  . Hypertension   . Prostate cancer [facility])     SOCIAL HX:  Social History   Tobacco Use  . Smoking status: Former Smoker    Types: Cigarettes    Quit date: 03/22/1958    Years since quitting: 61.5  . Smokeless tobacco: Never Used  Substance Use Topics  . Alcohol use: No    ALLERGIES:  Allergies  Allergen Reactions  . Levofloxacin Other (See Comments)    Taste alterations for about 1 month after taking med     PERTINENT MEDICATIONS:  Outpatient Encounter Medications as of 09/24/2019  Medication Sig  . acetaminophen (TYLENOL) 325 MG tablet Take 2 tablets (650 mg total) by mouth every 6 (six) hours.  [name] aspirin EC 81 MG tablet Take 81 mg by mouth once.   [name] CALCIUM-MAGNESUIUM-ZINC 333-133-8.3 MG TABS Take 1 tablet by mouth daily.  [name] docusate sodium (COLACE) 100  MG capsule Take 100 mg by mouth at bedtime.  [name] lisinopril-hydrochlorothiazide (ZESTORETIC) 10-12.5 MG tablet Take 1 tablet by mouth daily.  [name] oxyCODONE (OXY IR/ROXICODONE) 5 MG immediate release tablet Take 1 tablet (5 mg total) by mouth every 4 (four) hours as needed for severe pain.  [name] Potassium 99 MG TABS Take 1 tablet by mouth daily.   . traMADol (ULTRAM) 50 MG tablet Take 50 mg by mouth every 6 (six) hours as needed.  . Turmeric 500 MG TABS Take 500 mg by mouth at bedtime.  . vitamin B-12 (CYANOCOBALAMIN) 1000 MCG tablet Take 1,000 mcg by mouth daily.  . Vitamin D, Cholecalciferol, 1000 units CAPS Take 1,000 Units by mouth daily.   No facility-administered encounter medications on file as of 09/24/2019.    PHYSICAL EXAM:   General: NAD, frail appearing, thin, elderly pleasant male Cardiovascular: regular rate and rhythm Pulmonary: clear ant fields Neurological: Walks with walker  Dorian Renfro Ihor Gully, NP

## 2019-09-30 ENCOUNTER — Other Ambulatory Visit: Payer: Self-pay | Admitting: *Deleted

## 2019-10-01 ENCOUNTER — Telehealth: Payer: Self-pay

## 2019-10-01 ENCOUNTER — Other Ambulatory Visit: Payer: Self-pay | Admitting: *Deleted

## 2019-10-01 DIAGNOSIS — D4620 Refractory anemia with excess of blasts, unspecified: Secondary | ICD-10-CM

## 2019-10-01 NOTE — Telephone Encounter (Signed)
Palliayive care SW made TC to patients daughter whom inquired about continued private duty caregivers once [facility] services ended. SW emailed daughter a list of a few agnecies she can outreach. No other needs voiced or identified.

## 2019-10-04 ENCOUNTER — Other Ambulatory Visit: Payer: Self-pay

## 2019-10-04 ENCOUNTER — Inpatient Hospital Stay (HOSPITAL_BASED_OUTPATIENT_CLINIC_OR_DEPARTMENT_OTHER): Payer: Medicare Other | Admitting: Internal Medicine

## 2019-10-04 ENCOUNTER — Inpatient Hospital Stay: Payer: Medicare Other

## 2019-10-04 ENCOUNTER — Inpatient Hospital Stay: Payer: Medicare Other | Attending: Internal Medicine

## 2019-10-04 ENCOUNTER — Encounter: Payer: Self-pay | Admitting: Internal Medicine

## 2019-10-04 DIAGNOSIS — Z79899 Other long term (current) drug therapy: Secondary | ICD-10-CM | POA: Insufficient documentation

## 2019-10-04 DIAGNOSIS — Z85828 Personal history of other malignant neoplasm of skin: Secondary | ICD-10-CM | POA: Insufficient documentation

## 2019-10-04 DIAGNOSIS — I10 Essential (primary) hypertension: Secondary | ICD-10-CM | POA: Insufficient documentation

## 2019-10-04 DIAGNOSIS — Z87891 Personal history of nicotine dependence: Secondary | ICD-10-CM | POA: Diagnosis not present

## 2019-10-04 DIAGNOSIS — Z8546 Personal history of malignant neoplasm of prostate: Secondary | ICD-10-CM | POA: Insufficient documentation

## 2019-10-04 DIAGNOSIS — D649 Anemia, unspecified: Secondary | ICD-10-CM | POA: Diagnosis not present

## 2019-10-04 DIAGNOSIS — D4620 Refractory anemia with excess of blasts, unspecified: Secondary | ICD-10-CM | POA: Diagnosis not present

## 2019-10-04 DIAGNOSIS — I4891 Unspecified atrial fibrillation: Secondary | ICD-10-CM | POA: Diagnosis not present

## 2019-10-04 DIAGNOSIS — Z7982 Long term (current) use of aspirin: Secondary | ICD-10-CM | POA: Diagnosis not present

## 2019-10-04 LAB — SAMPLE TO BLOOD BANK

## 2019-10-04 LAB — HEMOGLOBIN: Hemoglobin: 9 g/dL — ABNORMAL LOW (ref 13.0–17.0)

## 2019-10-04 LAB — HEMATOCRIT: HCT: 27 % — ABNORMAL LOW (ref 39.0–52.0)

## 2019-10-04 MED ORDER — EPOETIN ALFA-EPBX 10000 UNIT/ML IJ SOLN
20000.0000 [IU] | Freq: Once | INTRAMUSCULAR | Status: AC
  Administered 2019-10-04: 20000 [IU] via SUBCUTANEOUS
  Filled 2019-10-04: qty 2

## 2019-10-04 NOTE — Assessment & Plan Note (Addendum)
#  Anemia ?  Likely secondary to MDS. [no previous BMBx]- Today hemoglobin is 9.0 s/p PRBC transfusion. Declines Bone marrow Biopsy.  Proceed with Retacrit.   # Discussed the stratergy of increasing the dose of retacrit [will increase to 40,000 units]; and also increasing frequency of injections to every 2 weeks.  We will continue PRBC transfusion to keep the hemoglobin above 8.  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  Stable.  December 2020-iron saturation 36%.  #I spoke to patient's daughter  regarding above plan.  She is in agreement.  # DISPOSITION:  # Retacrit today; NO transfusion today.  # in 2 weeks; H&H; Hold tube; posisble Retacrit/ 1 unit PRBC transfusion # Follow up in 4 weeks MD; cbc/cmp;LDH; HOLD tube; -possible retacrit/1 unit PRBC transfusion -Dr.B

## 2019-10-04 NOTE — Progress Notes (Signed)
[facility] OFFICE PROGRESS NOTE  Gerald [name] Care Team: [name], MD as PCP - General (Family Medicine) Cammie Sickle, MD as Consulting Physician (Internal Medicine)   SUMMARY OF HEMATOLOGIC HISTORY:  # Anemia sec to -  Macrocytic ? MDS- no BMBx] on Procrit 20000 Units q ? 22M; on aranesp; [poor to tolerance/fatigue- ] FEB 2021- switch to retacrit 20 every 3 to 4 weeks,K; AUG 1st week 2021-increase Retacrit 40,000 units every 2 weeks  # ? Hemochromatosis- heterozygous [ferritin 800-900s] - on surveillance.   INTERVAL HISTORY:   A very pleasant 86 year-old male Gerald [name] who is fairly active for his age currently on procrit for anemia secondary to likely /MDS is here for follow-up.  Gerald [name] had received 2 blood transfusion the last couple of months.  The last transfusion was about 3 weeks ago.  Gerald [name] notes to improvement of his fatigue post blood transfusion.  Denies any blood in stools or black or stools.  As per family complaining of increased thirst.  No falls.  No changes in medication.  Review of Systems  Constitutional: Positive for malaise/fatigue. Negative for chills, diaphoresis, fever and weight loss.  HENT: Negative for nosebleeds and sore throat.   Eyes: Negative for double vision.  Respiratory: Negative for cough, hemoptysis, sputum production, shortness of breath and wheezing.   Cardiovascular: Negative for chest pain, palpitations, orthopnea and leg swelling.  Gastrointestinal: Negative for abdominal pain, blood in stool, constipation, diarrhea, heartburn, melena, nausea and vomiting.  Genitourinary: Negative for dysuria, frequency and urgency.  Musculoskeletal: Positive for back pain. Negative for joint pain.  Skin: Negative.  Negative for itching and rash.  Neurological: Negative for tingling, focal weakness, weakness and headaches.  Endo/Heme/Allergies: Does not bruise/bleed easily.  Psychiatric/Behavioral: Negative for depression. The  Gerald [name] is not nervous/anxious and does not have insomnia.      PAST MEDICAL HISTORY :  Past Medical History:  Diagnosis Date  . Anemia   . Atrial fibrillation ([place])   . Cancer [facility])    Testicular  . Hemochromatosis 10/25/2014  . Hx of basal cell carcinoma 2008   R. ant. zygomatic/sideburn 2008, L preauricular 2019  . Hypertension   . Prostate cancer ([place])     PAST SURGICAL HISTORY :   Past Surgical History:  Procedure Laterality Date  . ANTERIOR APPROACH HEMI HIP ARTHROPLASTY Left 05/28/2019   Procedure: ANTERIOR APPROACH HEMI HIP ARTHROPLASTY;  Surgeon: Hessie Knows, MD;  Location: [facility] ORS;  Service: Orthopedics;  Laterality: Left;  . APPENDECTOMY    . CHOLECYSTECTOMY    . HAND SURGERY Bilateral   . INNER EAR SURGERY    . KYPHOPLASTY N/A 12/14/2015   Procedure: KYPHOPLASTY  L2;  Surgeon: Hessie Knows, MD;  Location: [facility] ORS;  Service: Orthopedics;  Laterality: N/A;  . PROSTATE SURGERY    . SURGERY SCROTAL / TESTICULAR Right     FAMILY HISTORY :   Family History  Problem Relation Age of Onset  . Hypertension Mother     SOCIAL HISTORY:   Social History   Tobacco Use  . Smoking status: Former Smoker    Types: Cigarettes    Quit date: 03/22/1958    Years since quitting: 61.5  . Smokeless tobacco: Never Used  Substance Use Topics  . Alcohol use: No  . Drug use: No    ALLERGIES:  is allergic to levofloxacin.  MEDICATIONS:  Current Outpatient Medications  Medication Sig Dispense Refill  . acetaminophen (TYLENOL) 325 MG tablet Take 2 tablets (650 mg  total) by mouth every 6 (six) hours.    [name] aspirin EC 81 MG tablet Take 81 mg by mouth once.     [name] CALCIUM-MAGNESUIUM-ZINC 333-133-8.3 MG TABS Take 1 tablet by mouth daily.    . diclofenac Sodium (VOLTAREN) 1 % GEL Apply topically 4 (four) times daily.    [name] docusate sodium (COLACE) 100 MG capsule Take 100 mg by mouth at bedtime.    . Lido-Capsaicin-Men-Methyl Sal (MEDI-PATCH-LIDOCAINE EX) Apply topically.    .  Potassium 99 MG TABS Take 1 tablet by mouth daily.     . Turmeric 500 MG TABS Take 500 mg by mouth at bedtime.    . vitamin B-12 (CYANOCOBALAMIN) 1000 MCG tablet Take 1,000 mcg by mouth daily.    . Vitamin D, Cholecalciferol, 1000 units CAPS Take 1,000 Units by mouth daily.    [name] lisinopril-hydrochlorothiazide (ZESTORETIC) 10-12.5 MG tablet Take 1 tablet by mouth daily. (Gerald [name] not taking: Reported on 10/04/2019)    . oxyCODONE (OXY IR/ROXICODONE) 5 MG immediate release tablet Take 1 tablet (5 mg total) by mouth every 4 (four) hours as needed for severe pain. (Gerald [name] not taking: Reported on 10/04/2019) 42 tablet 0  . traMADol (ULTRAM) 50 MG tablet Take 50 mg by mouth every 6 (six) hours as needed. (Gerald [name] not taking: Reported on 10/04/2019)     No current facility-administered medications for this visit.    PHYSICAL EXAMINATION:   BP (!) 150/89 (BP Location: Left Arm, Gerald [name] Position: Sitting, Cuff Size: Normal)   Pulse 68   Temp 98.1 F (36.7 C) (Tympanic)   Resp 16   Ht '5\' 9"'  (1.753 m)   Wt 132 lb (59.9 kg)   SpO2 99%   BMI 19.49 kg/m   Filed Weights   10/04/19 0907  Weight: 132 lb (59.9 kg)    Physical Exam Constitutional:      Comments: Elderly male Gerald [name]; he is in a wheelchair.  He is accompanied by his daughter.  Looks younger than his stated age.  HENT:     Head: Normocephalic and atraumatic.     Mouth/Throat:     Pharynx: No oropharyngeal exudate.  Eyes:     Pupils: Pupils are equal, round, and reactive to light.  Cardiovascular:     Rate and Rhythm: Normal rate and regular rhythm.  Pulmonary:     Effort: Pulmonary effort is normal. No respiratory distress.     Breath sounds: Normal breath sounds. No wheezing.  Abdominal:     General: Bowel sounds are normal. There is no distension.     Palpations: Abdomen is soft. There is no mass.     Tenderness: There is no abdominal tenderness. There is no guarding or rebound.  Musculoskeletal:        General: No  tenderness. Normal range of motion.     Cervical back: Normal range of motion and neck supple.  Skin:    General: Skin is warm.  Neurological:     Mental Status: He is alert and oriented to person, place, and time.  Psychiatric:        Mood and Affect: Affect normal.      LABORATORY DATA:  I have reviewed the data as listed    Component Value Date/Time   NA 143 09/13/2019 0850   NA 142 05/06/2013 0158   K 3.3 (L) 09/13/2019 0850   K 3.7 05/06/2013 0158   CL 108 09/13/2019 0850   CL 112 (H) 05/06/2013 0158   CO2 26 09/13/2019 0850  CO2 24 05/06/2013 0158   GLUCOSE 135 (H) 09/13/2019 0850   GLUCOSE 95 05/06/2013 0158   BUN 33 (H) 09/13/2019 0850   BUN 32 (H) 05/06/2013 0158   CREATININE 0.97 09/13/2019 0850   CREATININE 1.Gerald 11/25/2013 1332   CALCIUM 9.0 09/13/2019 0850   CALCIUM 9.1 05/06/2013 0158   PROT 6.4 (L) 09/13/2019 0850   PROT 6.1 (L) 05/06/2013 0158   ALBUMIN 4.0 09/13/2019 0850   ALBUMIN 3.5 05/06/2013 0158   AST 13 (L) 09/13/2019 0850   AST Gerald 05/06/2013 0158   ALT 10 09/13/2019 0850   ALT 18 05/06/2013 0158   ALKPHOS 51 09/13/2019 0850   ALKPHOS 60 05/06/2013 0158   BILITOT 1.7 (H) 09/13/2019 0850   BILITOT 1.4 (H) 05/06/2013 0158   GFRNONAA >60 09/13/2019 0850   GFRNONAA 52 (L) 11/25/2013 1332   GFRAA >60 09/13/2019 0850   GFRAA >60 11/25/2013 1332    No results found for: SPEP, UPEP  Lab Results  Component Value Date   WBC 5.6 09/13/2019   NEUTROABS 3.2 09/13/2019   HGB 9.0 (L) 10/04/2019   HCT 27.0 (L) 10/04/2019   MCV 94.5 09/13/2019   PLT 378 09/13/2019      Chemistry      Component Value Date/Time   NA 143 09/13/2019 0850   NA 142 05/06/2013 0158   K 3.3 (L) 09/13/2019 0850   K 3.7 05/06/2013 0158   CL 108 09/13/2019 0850   CL 112 (H) 05/06/2013 0158   CO2 26 09/13/2019 0850   CO2 24 05/06/2013 0158   BUN 33 (H) 09/13/2019 0850   BUN 32 (H) 05/06/2013 0158   CREATININE 0.97 09/13/2019 0850   CREATININE 1.Gerald 11/25/2013 1332       Component Value Date/Time   CALCIUM 9.0 09/13/2019 0850   CALCIUM 9.1 05/06/2013 0158   ALKPHOS 51 09/13/2019 0850   ALKPHOS 60 05/06/2013 0158   AST 13 (L) 09/13/2019 0850   AST Gerald 05/06/2013 0158   ALT 10 09/13/2019 0850   ALT 18 05/06/2013 0158   BILITOT 1.7 (H) 09/13/2019 0850   BILITOT 1.4 (H) 05/06/2013 0158       RADIOGRAPHIC STUDIES: I have personally reviewed the radiological images as listed and agreed with the findings in the report. No results found.   ASSESSMENT & PLAN:   MDS (myelodysplastic syndrome), low grade ([facility]) #  Anemia ?  Likely secondary to MDS. [no previous BMBx]- Today hemoglobin is 9.0 s/p PRBC transfusion. Declines Bone marrow Biopsy.  Proceed with Retacrit.   # Discussed the stratergy of increasing the dose of retacrit [will increase to 40,000 units]; and also increasing frequency of injections to every 2 weeks.  We will continue PRBC transfusion to keep the hemoglobin above 8.  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  Stable.  December 2020-iron saturation 36%.  #I spoke to Gerald [name]'s daughter  regarding above plan.  She is in agreement.  # DISPOSITION:  # Retacrit today; NO transfusion today.  # in 2 weeks; H&H; Hold tube; posisble Retacrit/ 1 unit PRBC transfusion # Follow up in 4 weeks MD; cbc/cmp;LDH; HOLD tube; -possible retacrit/1 unit PRBC transfusion -Dr.B     Cammie Sickle, MD 10/04/2019 12:32 PM

## 2019-10-18 ENCOUNTER — Inpatient Hospital Stay: Payer: Medicare Other | Attending: Internal Medicine

## 2019-10-18 ENCOUNTER — Inpatient Hospital Stay: Payer: Medicare Other

## 2019-10-18 ENCOUNTER — Other Ambulatory Visit: Payer: Self-pay

## 2019-10-18 DIAGNOSIS — D631 Anemia in chronic kidney disease: Secondary | ICD-10-CM | POA: Diagnosis not present

## 2019-10-18 DIAGNOSIS — D469 Myelodysplastic syndrome, unspecified: Secondary | ICD-10-CM | POA: Insufficient documentation

## 2019-10-18 DIAGNOSIS — Z79899 Other long term (current) drug therapy: Secondary | ICD-10-CM | POA: Insufficient documentation

## 2019-10-18 DIAGNOSIS — I10 Essential (primary) hypertension: Secondary | ICD-10-CM | POA: Diagnosis not present

## 2019-10-18 DIAGNOSIS — Z87891 Personal history of nicotine dependence: Secondary | ICD-10-CM | POA: Diagnosis not present

## 2019-10-18 DIAGNOSIS — Z8546 Personal history of malignant neoplasm of prostate: Secondary | ICD-10-CM | POA: Insufficient documentation

## 2019-10-18 DIAGNOSIS — I129 Hypertensive chronic kidney disease with stage 1 through stage 4 chronic kidney disease, or unspecified chronic kidney disease: Secondary | ICD-10-CM | POA: Diagnosis present

## 2019-10-18 DIAGNOSIS — Z7982 Long term (current) use of aspirin: Secondary | ICD-10-CM | POA: Insufficient documentation

## 2019-10-18 DIAGNOSIS — Z85828 Personal history of other malignant neoplasm of skin: Secondary | ICD-10-CM | POA: Insufficient documentation

## 2019-10-18 DIAGNOSIS — N183 Chronic kidney disease, stage 3 unspecified: Secondary | ICD-10-CM | POA: Diagnosis not present

## 2019-10-18 DIAGNOSIS — I4891 Unspecified atrial fibrillation: Secondary | ICD-10-CM | POA: Insufficient documentation

## 2019-10-18 DIAGNOSIS — D4620 Refractory anemia with excess of blasts, unspecified: Secondary | ICD-10-CM

## 2019-10-18 LAB — HEMATOCRIT: HCT: 30.8 % — ABNORMAL LOW (ref 39.0–52.0)

## 2019-10-18 LAB — HEMOGLOBIN: Hemoglobin: 10.1 g/dL — ABNORMAL LOW (ref 13.0–17.0)

## 2019-10-18 LAB — SAMPLE TO BLOOD BANK

## 2019-10-18 NOTE — Progress Notes (Unsigned)
Patient notified no blood and retacrit today, per Dr. Rogue Bussing due to hgb level being 10.1. Patient and family member understands and all questions answered.

## 2019-10-21 ENCOUNTER — Telehealth: Payer: Self-pay | Admitting: Nurse Practitioner

## 2019-10-21 NOTE — Telephone Encounter (Signed)
Called daughter, Rivka Safer, to see if it was okay to change the time of the Palliative f/u visit on 8/30 from 3 to 3:30 PM - no answer, message left with my contact information.

## 2019-10-21 NOTE — Telephone Encounter (Signed)
Called daugher, Jonelle Sidle to see if it was okay if we changed the time of the Palliative f/u visit on 8/30 from 3 to 3:30 PM - left message with my contact information to confirm if this was okay or not.

## 2019-10-27 ENCOUNTER — Telehealth: Payer: Self-pay | Admitting: Nurse Practitioner

## 2019-10-27 NOTE — Telephone Encounter (Signed)
Spoke with daughter, [name], to see if we could reschedule the Palliative f/u visit for 11/15/19 to 11/23/19 @ 3 PM and she as in agreement with this.

## 2019-11-01 ENCOUNTER — Inpatient Hospital Stay: Payer: Medicare Other

## 2019-11-01 ENCOUNTER — Encounter: Payer: Self-pay | Admitting: Internal Medicine

## 2019-11-01 ENCOUNTER — Inpatient Hospital Stay (HOSPITAL_BASED_OUTPATIENT_CLINIC_OR_DEPARTMENT_OTHER): Payer: Medicare Other | Admitting: Internal Medicine

## 2019-11-01 ENCOUNTER — Other Ambulatory Visit: Payer: Self-pay

## 2019-11-01 DIAGNOSIS — D4620 Refractory anemia with excess of blasts, unspecified: Secondary | ICD-10-CM | POA: Diagnosis not present

## 2019-11-01 DIAGNOSIS — I129 Hypertensive chronic kidney disease with stage 1 through stage 4 chronic kidney disease, or unspecified chronic kidney disease: Secondary | ICD-10-CM | POA: Diagnosis not present

## 2019-11-01 DIAGNOSIS — D649 Anemia, unspecified: Secondary | ICD-10-CM

## 2019-11-01 LAB — CBC WITH DIFFERENTIAL/PLATELET
Abs Immature Granulocytes: 0.02 10*3/uL (ref 0.00–0.07)
Basophils Absolute: 0.1 10*3/uL (ref 0.0–0.1)
Basophils Relative: 1 %
Eosinophils Absolute: 0.5 10*3/uL (ref 0.0–0.5)
Eosinophils Relative: 7 %
HCT: 27.9 % — ABNORMAL LOW (ref 39.0–52.0)
Hemoglobin: 9.3 g/dL — ABNORMAL LOW (ref 13.0–17.0)
Immature Granulocytes: 0 %
Lymphocytes Relative: 21 %
Lymphs Abs: 1.4 10*3/uL (ref 0.7–4.0)
MCH: 31.4 pg (ref 26.0–34.0)
MCHC: 33.3 g/dL (ref 30.0–36.0)
MCV: 94.3 fL (ref 80.0–100.0)
Monocytes Absolute: 0.7 10*3/uL (ref 0.1–1.0)
Monocytes Relative: 11 %
Neutro Abs: 4 10*3/uL (ref 1.7–7.7)
Neutrophils Relative %: 60 %
Platelets: 345 10*3/uL (ref 150–400)
RBC: 2.96 MIL/uL — ABNORMAL LOW (ref 4.22–5.81)
RDW: 22.5 % — ABNORMAL HIGH (ref 11.5–15.5)
WBC: 6.8 10*3/uL (ref 4.0–10.5)
nRBC: 0 % (ref 0.0–0.2)

## 2019-11-01 LAB — SAMPLE TO BLOOD BANK

## 2019-11-01 LAB — COMPREHENSIVE METABOLIC PANEL
ALT: 13 U/L (ref 0–44)
AST: 16 U/L (ref 15–41)
Albumin: 4.2 g/dL (ref 3.5–5.0)
Alkaline Phosphatase: 56 U/L (ref 38–126)
Anion gap: 9 (ref 5–15)
BUN: 31 mg/dL — ABNORMAL HIGH (ref 8–23)
CO2: 26 mmol/L (ref 22–32)
Calcium: 9.2 mg/dL (ref 8.9–10.3)
Chloride: 107 mmol/L (ref 98–111)
Creatinine, Ser: 0.95 mg/dL (ref 0.61–1.24)
GFR calc Af Amer: >60 mL/min (ref >60)
GFR calc non Af Amer: >60 mL/min (ref >60)
Glucose, Bld: 134 mg/dL — ABNORMAL HIGH (ref 70–99)
Potassium: 3.7 mmol/L (ref 3.5–5.1)
Sodium: 142 mmol/L (ref 135–145)
Total Bilirubin: 1.1 mg/dL (ref 0.3–1.2)
Total Protein: 6.7 g/dL (ref 6.5–8.1)

## 2019-11-01 LAB — LACTATE DEHYDROGENASE: LDH: 134 U/L (ref 98–192)

## 2019-11-01 MED ORDER — EPOETIN ALFA-EPBX 40000 UNIT/ML IJ SOLN
40000.0000 [IU] | Freq: Once | INTRAMUSCULAR | Status: AC
  Administered 2019-11-01: 40000 [IU] via SUBCUTANEOUS
  Filled 2019-11-01: qty 1

## 2019-11-01 NOTE — Assessment & Plan Note (Addendum)
#  Anemia ?  Likely secondary to MDS. [no previous BMBx]- Today hemoglobin is 9.1 s/p PRBC transfusion. Declines Bone marrow Biopsy.  Proceed with Retacrit 40,K q 2 W.   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  Stable.  December 2020-iron saturation 36%.  #I spoke to patient's daughter  regarding above plan.  She is in agreement.  # DISPOSITION:  # Retacrit today; NO transfusion today.   # in 3 weeks; H&H; Hold tube; posisble Retacrit OR1 unit PRBC transfusion  # Follow up in 6 weeks MD; cbc/cmp;LDH; HOLD tube; -possible retacrit OR 1 unit PRBC transfusion -Dr.B

## 2019-11-01 NOTE — Progress Notes (Signed)
[facility] OFFICE PROGRESS NOTE  Patient Care Team: [name], MD as PCP - General (Family Medicine) Cammie Sickle, MD as Consulting Physician (Internal Medicine)   SUMMARY OF HEMATOLOGIC HISTORY:  # Anemia sec to -  Macrocytic ? MDS- no BMBx] on Procrit 20000 Units q ? 48M; on aranesp; [poor to tolerance/fatigue- ] FEB 2021- switch to retacrit 20 every 3 to 4 weeks,K; AUG 1st week 2021-increase Retacrit 40,000 units every 2 weeks  # ? Hemochromatosis- heterozygous [ferritin 800-900s] - on surveillance.   INTERVAL HISTORY:   A very pleasant 86 year-old male patient who is fairly active for his age currently on procrit for anemia secondary to likely /MDS is here for follow-up.  Patient has not had any blood transfusion the last 1 month.  He feels better today.  Continues to have chronic mild to moderate fatigue.  Not any worse.   Review of Systems  Constitutional: Positive for malaise/fatigue. Negative for chills, diaphoresis, fever and weight loss.  HENT: Negative for nosebleeds and sore throat.   Eyes: Negative for double vision.  Respiratory: Negative for cough, hemoptysis, sputum production, shortness of breath and wheezing.   Cardiovascular: Negative for chest pain, palpitations, orthopnea and leg swelling.  Gastrointestinal: Negative for abdominal pain, blood in stool, constipation, diarrhea, heartburn, melena, nausea and vomiting.  Genitourinary: Negative for dysuria, frequency and urgency.  Musculoskeletal: Positive for back pain. Negative for joint pain.  Skin: Negative.  Negative for itching and rash.  Neurological: Negative for tingling, focal weakness, weakness and headaches.  Endo/Heme/Allergies: Does not bruise/bleed easily.  Psychiatric/Behavioral: Negative for depression. The patient is not nervous/anxious and does not have insomnia.      PAST MEDICAL HISTORY :  Past Medical History:  Diagnosis Date  . Anemia   . Atrial fibrillation  ([place])   . Cancer [facility])    Testicular  . Hemochromatosis 10/25/2014  . Hx of basal cell carcinoma 2008   R. ant. zygomatic/sideburn 2008, L preauricular 2019  . Hypertension   . Prostate cancer ([place])     PAST SURGICAL HISTORY :   Past Surgical History:  Procedure Laterality Date  . ANTERIOR APPROACH HEMI HIP ARTHROPLASTY Left 05/28/2019   Procedure: ANTERIOR APPROACH HEMI HIP ARTHROPLASTY;  Surgeon: Hessie Knows, MD;  Location: [facility] ORS;  Service: Orthopedics;  Laterality: Left;  . APPENDECTOMY    . CHOLECYSTECTOMY    . HAND SURGERY Bilateral   . INNER EAR SURGERY    . KYPHOPLASTY N/A 12/14/2015   Procedure: KYPHOPLASTY  L2;  Surgeon: Hessie Knows, MD;  Location: [facility] ORS;  Service: Orthopedics;  Laterality: N/A;  . PROSTATE SURGERY    . SURGERY SCROTAL / TESTICULAR Right     FAMILY HISTORY :   Family History  Problem Relation Age of Onset  . Hypertension Mother     SOCIAL HISTORY:   Social History   Tobacco Use  . Smoking status: Former Smoker    Types: Cigarettes    Quit date: 03/22/1958    Years since quitting: 61.6  . Smokeless tobacco: Never Used  Substance Use Topics  . Alcohol use: No  . Drug use: No    ALLERGIES:  is allergic to levofloxacin.  MEDICATIONS:  Current Outpatient Medications  Medication Sig Dispense Refill  . acetaminophen (TYLENOL) 325 MG tablet Take 2 tablets (650 mg total) by mouth every 6 (six) hours.    [name] aspirin EC 81 MG tablet Take 81 mg by mouth once.     [name]  CALCIUM-MAGNESUIUM-ZINC 333-133-8.3 MG TABS Take 1 tablet by mouth daily.    . diclofenac Sodium (VOLTAREN) 1 % GEL Apply topically 4 (four) times daily.    [name] docusate sodium (COLACE) 100 MG capsule Take 100 mg by mouth at bedtime.    . Lido-Capsaicin-Men-Methyl Sal (MEDI-PATCH-LIDOCAINE EX) Apply topically.    [name] lisinopril-hydrochlorothiazide (ZESTORETIC) 10-12.5 MG tablet Take 1 tablet by mouth daily.     [name] oxyCODONE (OXY IR/ROXICODONE) 5 MG immediate release tablet Take 1 tablet (5  mg total) by mouth every 4 (four) hours as needed for severe pain. 42 tablet 0  . Potassium 99 MG TABS Take 1 tablet by mouth daily.     . traMADol (ULTRAM) 50 MG tablet Take 50 mg by mouth every 6 (six) hours as needed.     . Turmeric 500 MG TABS Take 500 mg by mouth at bedtime.    . vitamin B-12 (CYANOCOBALAMIN) 1000 MCG tablet Take 1,000 mcg by mouth daily.    . Vitamin D, Cholecalciferol, 1000 units CAPS Take 1,000 Units by mouth daily.     No current facility-administered medications for this visit.    PHYSICAL EXAMINATION:   BP (!) 164/70 (BP Location: Left Arm, Patient Position: Sitting, Cuff Size: Normal)   Pulse 69   Temp (!) 97.5 F (36.4 C) (Tympanic)   Resp 16   Ht _0  (1.753 m)   Wt 138 lb (62.6 kg)   SpO2 99%   BMI 20.38 kg/m   Filed Weights   11/01/19 0908  Weight: 138 lb (62.6 kg)    Physical Exam Constitutional:      Comments: Elderly male patient; he is in a wheelchair.  He is accompanied by his daughter.  Looks younger than his stated age.  HENT:     Head: Normocephalic and atraumatic.     Mouth/Throat:     Pharynx: No oropharyngeal exudate.  Eyes:     Pupils: Pupils are equal, round, and reactive to light.  Cardiovascular:     Rate and Rhythm: Normal rate and regular rhythm.  Pulmonary:     Effort: Pulmonary effort is normal. No respiratory distress.     Breath sounds: Normal breath sounds. No wheezing.  Abdominal:     General: Bowel sounds are normal. There is no distension.     Palpations: Abdomen is soft. There is no mass.     Tenderness: There is no abdominal tenderness. There is no guarding or rebound.  Musculoskeletal:        General: No tenderness. Normal range of motion.     Cervical back: Normal range of motion and neck supple.  Skin:    General: Skin is warm.  Neurological:     Mental Status: He is alert and oriented to person, place, and time.  Psychiatric:        Mood and Affect: Affect normal.      LABORATORY DATA:  I  have reviewed the data as listed    Component Value Date/Time   NA 142 11/01/2019 0841   NA 142 05/06/2013 0158   K 3.7 11/01/2019 0841   K 3.7 05/06/2013 0158   CL 107 11/01/2019 0841   CL 112 (H) 05/06/2013 0158   CO2 26 11/01/2019 0841   CO2 24 05/06/2013 0158   GLUCOSE 134 (H) 11/01/2019 0841   GLUCOSE 95 05/06/2013 0158   BUN 31 (H) 11/01/2019 0841   BUN 32 (H) 05/06/2013 0158   CREATININE 0.95 11/01/2019 0841   CREATININE 1.22  11/25/2013 1332   CALCIUM 9.2 11/01/2019 0841   CALCIUM 9.1 05/06/2013 0158   PROT 6.7 11/01/2019 0841   PROT 6.1 (L) 05/06/2013 0158   ALBUMIN 4.2 11/01/2019 0841   ALBUMIN 3.5 05/06/2013 0158   AST 16 11/01/2019 0841   AST 22 05/06/2013 0158   ALT 13 11/01/2019 0841   ALT 18 05/06/2013 0158   ALKPHOS 56 11/01/2019 0841   ALKPHOS 60 05/06/2013 0158   BILITOT 1.1 11/01/2019 0841   BILITOT 1.4 (H) 05/06/2013 0158   GFRNONAA >60 11/01/2019 0841   GFRNONAA 52 (L) 11/25/2013 1332   GFRAA >60 11/01/2019 0841   GFRAA >60 11/25/2013 1332    No results found for: SPEP, UPEP  Lab Results  Component Value Date   WBC 6.8 11/01/2019   NEUTROABS 4.0 11/01/2019   HGB 9.3 (L) 11/01/2019   HCT 27.9 (L) 11/01/2019   MCV 94.3 11/01/2019   PLT 345 11/01/2019      Chemistry      Component Value Date/Time   NA 142 11/01/2019 0841   NA 142 05/06/2013 0158   K 3.7 11/01/2019 0841   K 3.7 05/06/2013 0158   CL 107 11/01/2019 0841   CL 112 (H) 05/06/2013 0158   CO2 26 11/01/2019 0841   CO2 24 05/06/2013 0158   BUN 31 (H) 11/01/2019 0841   BUN 32 (H) 05/06/2013 0158   CREATININE 0.95 11/01/2019 0841   CREATININE 1.22 11/25/2013 1332      Component Value Date/Time   CALCIUM 9.2 11/01/2019 0841   CALCIUM 9.1 05/06/2013 0158   ALKPHOS 56 11/01/2019 0841   ALKPHOS 60 05/06/2013 0158   AST 16 11/01/2019 0841   AST 22 05/06/2013 0158   ALT 13 11/01/2019 0841   ALT 18 05/06/2013 0158   BILITOT 1.1 11/01/2019 0841   BILITOT 1.4 (H) 05/06/2013 0158        RADIOGRAPHIC STUDIES: I have personally reviewed the radiological images as listed and agreed with the findings in the report. No results found.   ASSESSMENT & PLAN:   MDS (myelodysplastic syndrome), low grade (HCC) #  Anemia ?  Likely secondary to MDS. [no previous BMBx]- Today hemoglobin is 9.1 s/p PRBC transfusion. Declines Bone marrow Biopsy.  Proceed with Retacrit 40,K q 2 W.   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  Stable.  December 2020-iron saturation 36%.  #I spoke to patient's daughter  regarding above plan.  She is in agreement.  # DISPOSITION:  # Retacrit today; NO transfusion today.   # in 3 weeks; H&H; Hold tube; posisble Retacrit OR1 unit PRBC transfusion  # Follow up in 6 weeks MD; cbc/cmp;LDH; HOLD tube; -possible retacrit OR 1 unit PRBC transfusion -Dr.B     Cammie Sickle, MD 11/01/2019 11:23 AM

## 2019-11-15 ENCOUNTER — Other Ambulatory Visit: Payer: Medicare Other | Admitting: Nurse Practitioner

## 2019-11-23 ENCOUNTER — Other Ambulatory Visit: Payer: Medicare Other

## 2019-11-23 ENCOUNTER — Other Ambulatory Visit: Payer: Self-pay

## 2019-11-23 ENCOUNTER — Ambulatory Visit: Payer: Medicare Other

## 2019-11-23 ENCOUNTER — Other Ambulatory Visit: Payer: Medicare Other | Admitting: Nurse Practitioner

## 2019-11-23 ENCOUNTER — Encounter: Payer: Self-pay | Admitting: Nurse Practitioner

## 2019-11-23 DIAGNOSIS — Z515 Encounter for palliative care: Secondary | ICD-10-CM

## 2019-11-23 DIAGNOSIS — D469 Myelodysplastic syndrome, unspecified: Secondary | ICD-10-CM

## 2019-11-23 NOTE — Progress Notes (Signed)
[facility] Consult Note Telephone: [phone]  Fax: [phone]  PATIENT NAME: Gerald [name] DOB: [id number] MRN: [id number]  PRIMARY CARE PROVIDER:   [name], MD  REFERRING PROVIDER:  [name], MD [address] [facility] [place],  [place] [address]  RESPONSIBLE [place] daughter [phone]  RECOMMENDATIONS and PLAN: 1.MVE:HMCN code, discussed at length,ongoing discussions about aggressive, conservative, comfort measures, extensive discussion about cpr, most form. Will revisit at next [facility] visit or sooner should family choose to change their decision  2.Palliative care encounter; Palliative medicine team will continue to support patient, patient's family, and medical team. Visit consisted of counseling and education dealing with the complex and emotionally intense issues of symptom management and palliative care in the setting of serious and potentially life-threatening illness  3.   I spent 90 minutes providing this consultation,  from 2:30pm to 4:00pm. More than 50% of the time in this consultation was spent coordinating communication.   HISTORY OF PRESENT ILLNESS:  Gerald [name] is a 86 y.o. year old male with multiple medical problems including Myelodysplastic syndrome, testicular cancer s/p sgy, prostate cancer, history basal cell carcinoma, hemochromatosis, hypertension, anemia, appendectomy, cholecystectomy, bilateral hands surgery, kyphoplasty. Marland KitchenIn-person visit with Gerald [name] and daughter Gerald [name]. Gerald [name] has relocated to [name]'s home as his other daughters father-in-law recently passed. We talked about purpose of palliative visit. [name] and Gerald [name] in agreement. We talked about how he was feeling today. [name] endorses that he's doing okay taking one day as it comes. We talked about symptoms of pain which he does experience in his lower back, hands,  bilateral knees. He does take Advil at night in addition to Tylenol arthritis for pain. [name] also uses topical analgesics such as Aspercreme and Voltaren gel. We talked at length about current regimen. Discuss that may add flaxseed oil or flax seeds to meal could be beneficial. We talked about bowels. Gerald [name] endorses she does mix a little miralax and his hot tea in the morning. We talked about his appetite. We talked about what foods that he likes. Gerald [name] endorses that she does fix him three meals a day. We talked about mobility. [name] does walk with a three-wheel walker which is small enough that can fit in the bathroom. [name] endorses he does get stiff when he sits too long. Gerald [name] endorses she does try to exercise as long as he can tolerate it though it does wear him out. Gerald [name] endorses he walks to the back bathroom which is quite a distance in her home. Gerald [name] endorses she also does leg exercises with him when he is sitting in the chair the bed. [name] talked about wanting to walk more and outside. Gerald [name] endorses that they do have some challenges getting him down the stairs in her home as the front porch is currently requiring maintenance. We talked about when physical therapy was working with Mr. Weigelt. Gerald [name] endorses at that time it was too much and he would have to rest for two days after a session. We talked about the challenges with it being a fine line enough and beneficial versus too much and creating stress and fatigue. We talked about family dynamics. Gerald [name] talked about Gerald [name]'s, daughter father-in-law passing and they do have a large family 6 girls and boys that visit frequently, grandchildren and great-grandchildren. Gerald [name] talked about his birthday is coming up and he will be 22. We talked about what he  was going to do for his birthday. Gerald [name] endorses they have a birthday party. Gerald [name] endorses the family has birthday parties for every  person's birthday in the family. They are a very close supportive family. We talked about medical goals of care. We talked about upcoming appointment with Dr Hendricks Limes Oncologist tomorrow. We talked about role of palliative care and plan of care. Discussed will follow up in two months for ongoing discussions of medical goals of care as he currently remains they full code with aggressive intervention. Gerald [name] endorses present time Gerald [name] will stay at her home until midnight too late October. Therapeutic listening and emotional support provided. Appointment schedule. Questions answered to satisfaction.   Palliative Care was asked to help to continue to address goals of care.   CODE STATUS: Full code  PPS: 40% HOSPICE ELIGIBILITY/DIAGNOSIS: TBD  PAST MEDICAL HISTORY:  Past Medical History:  Diagnosis Date  . Anemia   . Atrial fibrillation ([place])   . Cancer [facility])    Testicular  . Hemochromatosis 10/25/2014  . Hx of basal cell carcinoma 2008   R. ant. zygomatic/sideburn 2008, L preauricular 2019  . Hypertension   . Prostate cancer [facility])     SOCIAL HX:  Social History   Tobacco Use  . Smoking status: Former Smoker    Types: Cigarettes    Quit date: 03/22/1958    Years since quitting: 61.7  . Smokeless tobacco: Never Used  Substance Use Topics  . Alcohol use: No    ALLERGIES:  Allergies  Allergen Reactions  . Levofloxacin Other (See Comments)    Taste alterations for about 1 month after taking med     PERTINENT MEDICATIONS:  Outpatient Encounter Medications as of 11/23/2019  Medication Sig  . acetaminophen (TYLENOL) 325 MG tablet Take 2 tablets (650 mg total) by mouth every 6 (six) hours.  [name] aspirin EC 81 MG tablet Take 81 mg by mouth once.   [name] CALCIUM-MAGNESUIUM-ZINC 333-133-8.3 MG TABS Take 1 tablet by mouth daily.  . diclofenac Sodium (VOLTAREN) 1 % GEL Apply topically 4 (four) times daily.  [name] docusate sodium (COLACE) 100 MG capsule Take 100 mg by mouth at bedtime.  .  Lido-Capsaicin-Men-Methyl Sal (MEDI-PATCH-LIDOCAINE EX) Apply topically.  [name] lisinopril-hydrochlorothiazide (ZESTORETIC) 10-12.5 MG tablet Take 1 tablet by mouth daily.   [name] oxyCODONE (OXY IR/ROXICODONE) 5 MG immediate release tablet Take 1 tablet (5 mg total) by mouth every 4 (four) hours as needed for severe pain.  [name] Potassium 99 MG TABS Take 1 tablet by mouth daily.   . traMADol (ULTRAM) 50 MG tablet Take 50 mg by mouth every 6 (six) hours as needed.   . Turmeric 500 MG TABS Take 500 mg by mouth at bedtime.  . vitamin B-12 (CYANOCOBALAMIN) 1000 MCG tablet Take 1,000 mcg by mouth daily.  . Vitamin D, Cholecalciferol, 1000 units CAPS Take 1,000 Units by mouth daily.   No facility-administered encounter medications on file as of 11/23/2019.    PHYSICAL EXAM:   General: NAD, frail appearing, thin, pleasant male Cardiovascular: regular rate and rhythm Pulmonary: clear ant fields Neurological: generalized weakness, walks with walker  Zoe Creasman Ihor Gully, NP

## 2019-11-24 ENCOUNTER — Inpatient Hospital Stay: Payer: Medicare Other | Attending: Internal Medicine

## 2019-11-24 ENCOUNTER — Inpatient Hospital Stay: Payer: Medicare Other

## 2019-11-24 ENCOUNTER — Other Ambulatory Visit: Payer: Self-pay

## 2019-11-24 ENCOUNTER — Other Ambulatory Visit: Payer: Medicare Other

## 2019-11-24 VITALS — BP 114/76 | HR 76

## 2019-11-24 DIAGNOSIS — Z8546 Personal history of malignant neoplasm of prostate: Secondary | ICD-10-CM | POA: Diagnosis not present

## 2019-11-24 DIAGNOSIS — Z79899 Other long term (current) drug therapy: Secondary | ICD-10-CM | POA: Insufficient documentation

## 2019-11-24 DIAGNOSIS — Z23 Encounter for immunization: Secondary | ICD-10-CM | POA: Diagnosis not present

## 2019-11-24 DIAGNOSIS — D4620 Refractory anemia with excess of blasts, unspecified: Secondary | ICD-10-CM

## 2019-11-24 DIAGNOSIS — Z8547 Personal history of malignant neoplasm of testis: Secondary | ICD-10-CM | POA: Diagnosis not present

## 2019-11-24 DIAGNOSIS — I4891 Unspecified atrial fibrillation: Secondary | ICD-10-CM | POA: Diagnosis not present

## 2019-11-24 DIAGNOSIS — Z8249 Family history of ischemic heart disease and other diseases of the circulatory system: Secondary | ICD-10-CM | POA: Diagnosis not present

## 2019-11-24 DIAGNOSIS — I10 Essential (primary) hypertension: Secondary | ICD-10-CM | POA: Insufficient documentation

## 2019-11-24 DIAGNOSIS — Z85828 Personal history of other malignant neoplasm of skin: Secondary | ICD-10-CM | POA: Diagnosis not present

## 2019-11-24 DIAGNOSIS — Z7982 Long term (current) use of aspirin: Secondary | ICD-10-CM | POA: Diagnosis not present

## 2019-11-24 DIAGNOSIS — Z791 Long term (current) use of non-steroidal anti-inflammatories (NSAID): Secondary | ICD-10-CM | POA: Insufficient documentation

## 2019-11-24 DIAGNOSIS — D469 Myelodysplastic syndrome, unspecified: Secondary | ICD-10-CM | POA: Diagnosis not present

## 2019-11-24 DIAGNOSIS — Z87891 Personal history of nicotine dependence: Secondary | ICD-10-CM | POA: Insufficient documentation

## 2019-11-24 DIAGNOSIS — D649 Anemia, unspecified: Secondary | ICD-10-CM

## 2019-11-24 LAB — HEMOGLOBIN: Hemoglobin: 9.1 g/dL — ABNORMAL LOW (ref 13.0–17.0)

## 2019-11-24 LAB — HEMATOCRIT: HCT: 27.1 % — ABNORMAL LOW (ref 39.0–52.0)

## 2019-11-24 MED ORDER — EPOETIN ALFA-EPBX 40000 UNIT/ML IJ SOLN
40000.0000 [IU] | Freq: Once | INTRAMUSCULAR | Status: AC
  Administered 2019-11-24: 40000 [IU] via SUBCUTANEOUS
  Filled 2019-11-24: qty 1

## 2019-12-13 ENCOUNTER — Inpatient Hospital Stay (HOSPITAL_BASED_OUTPATIENT_CLINIC_OR_DEPARTMENT_OTHER): Payer: Medicare Other | Admitting: Internal Medicine

## 2019-12-13 ENCOUNTER — Other Ambulatory Visit: Payer: Self-pay

## 2019-12-13 ENCOUNTER — Inpatient Hospital Stay: Payer: Medicare Other

## 2019-12-13 DIAGNOSIS — D4620 Refractory anemia with excess of blasts, unspecified: Secondary | ICD-10-CM

## 2019-12-13 DIAGNOSIS — D649 Anemia, unspecified: Secondary | ICD-10-CM

## 2019-12-13 DIAGNOSIS — D469 Myelodysplastic syndrome, unspecified: Secondary | ICD-10-CM | POA: Diagnosis not present

## 2019-12-13 DIAGNOSIS — Z23 Encounter for immunization: Secondary | ICD-10-CM

## 2019-12-13 LAB — CBC WITH DIFFERENTIAL/PLATELET
Abs Immature Granulocytes: 0.02 10*3/uL (ref 0.00–0.07)
Basophils Absolute: 0.1 10*3/uL (ref 0.0–0.1)
Basophils Relative: 2 %
Eosinophils Absolute: 0.4 10*3/uL (ref 0.0–0.5)
Eosinophils Relative: 5 %
HCT: 26.4 % — ABNORMAL LOW (ref 39.0–52.0)
Hemoglobin: 8.9 g/dL — ABNORMAL LOW (ref 13.0–17.0)
Immature Granulocytes: 0 %
Lymphocytes Relative: 21 %
Lymphs Abs: 1.6 10*3/uL (ref 0.7–4.0)
MCH: 31.9 pg (ref 26.0–34.0)
MCHC: 33.7 g/dL (ref 30.0–36.0)
MCV: 94.6 fL (ref 80.0–100.0)
Monocytes Absolute: 0.8 10*3/uL (ref 0.1–1.0)
Monocytes Relative: 11 %
Neutro Abs: 4.7 10*3/uL (ref 1.7–7.7)
Neutrophils Relative %: 61 %
Platelets: 357 10*3/uL (ref 150–400)
RBC: 2.79 MIL/uL — ABNORMAL LOW (ref 4.22–5.81)
RDW: 24.3 % — ABNORMAL HIGH (ref 11.5–15.5)
WBC: 7.6 10*3/uL (ref 4.0–10.5)
nRBC: 0 % (ref 0.0–0.2)

## 2019-12-13 LAB — COMPREHENSIVE METABOLIC PANEL
ALT: 15 U/L (ref 0–44)
AST: 17 U/L (ref 15–41)
Albumin: 4.2 g/dL (ref 3.5–5.0)
Alkaline Phosphatase: 55 U/L (ref 38–126)
Anion gap: 7 (ref 5–15)
BUN: 37 mg/dL — ABNORMAL HIGH (ref 8–23)
CO2: 26 mmol/L (ref 22–32)
Calcium: 8.9 mg/dL (ref 8.9–10.3)
Chloride: 108 mmol/L (ref 98–111)
Creatinine, Ser: 0.92 mg/dL (ref 0.61–1.24)
GFR calc Af Amer: >60 mL/min (ref >60)
GFR calc non Af Amer: >60 mL/min (ref >60)
Glucose, Bld: 130 mg/dL — ABNORMAL HIGH (ref 70–99)
Potassium: 3.8 mmol/L (ref 3.5–5.1)
Sodium: 141 mmol/L (ref 135–145)
Total Bilirubin: 1.4 mg/dL — ABNORMAL HIGH (ref 0.3–1.2)
Total Protein: 6.5 g/dL (ref 6.5–8.1)

## 2019-12-13 LAB — LACTATE DEHYDROGENASE: LDH: 144 U/L (ref 98–192)

## 2019-12-13 LAB — SAMPLE TO BLOOD BANK

## 2019-12-13 MED ORDER — INFLUENZA VAC A&B SA ADJ QUAD 0.5 ML IM PRSY
0.5000 mL | PREFILLED_SYRINGE | Freq: Once | INTRAMUSCULAR | Status: AC
  Administered 2019-12-13: 0.5 mL via INTRAMUSCULAR
  Filled 2019-12-13: qty 0.5

## 2019-12-13 MED ORDER — EPOETIN ALFA-EPBX 40000 UNIT/ML IJ SOLN
40000.0000 [IU] | Freq: Once | INTRAMUSCULAR | Status: AC
  Administered 2019-12-13: 40000 [IU] via SUBCUTANEOUS
  Filled 2019-12-13: qty 1

## 2019-12-13 NOTE — Progress Notes (Signed)
[facility] OFFICE PROGRESS NOTE  Patient Care Team: [name], MD as PCP - General (Family Medicine) Cammie Sickle, MD as Consulting Physician (Internal Medicine)   SUMMARY OF HEMATOLOGIC HISTORY:  # Anemia sec to -  Macrocytic ? MDS- no BMBx] on Procrit 20000 Units q ? 59M; on aranesp; [poor to tolerance/fatigue- ] FEB 2021- switch to retacrit 20 every 3 to 4 weeks,K; AUG 1st week 2021-increase Retacrit 40,000 units every 2 weeks  # ? Hemochromatosis- heterozygous [ferritin 800-900s] - on surveillance.   INTERVAL HISTORY:   A very pleasant 86 year-old male patient who is fairly active for his age currently on procrit for anemia secondary to likely /MDS is here for follow-up.  Patient has not had any blood transfusion the last 2 to 3 months.  He feels overall well today.  However complains of mild to moderate fatigue.  Not any worse.  No blood in stools or black or stools.  As per the daughter noted to have mild cough.  No fevers or chills.  Review of Systems  Constitutional: Positive for malaise/fatigue. Negative for chills, diaphoresis, fever and weight loss.  HENT: Negative for nosebleeds and sore throat.   Eyes: Negative for double vision.  Respiratory: Positive for cough. Negative for hemoptysis, sputum production, shortness of breath and wheezing.   Cardiovascular: Negative for chest pain, palpitations, orthopnea and leg swelling.  Gastrointestinal: Negative for abdominal pain, blood in stool, constipation, diarrhea, heartburn, melena, nausea and vomiting.  Genitourinary: Negative for dysuria, frequency and urgency.  Musculoskeletal: Positive for back pain. Negative for joint pain.  Skin: Negative.  Negative for itching and rash.  Neurological: Negative for tingling, focal weakness, weakness and headaches.  Endo/Heme/Allergies: Does not bruise/bleed easily.  Psychiatric/Behavioral: Negative for depression. The patient is not nervous/anxious and does  not have insomnia.      PAST MEDICAL HISTORY :  Past Medical History:  Diagnosis Date  . Anemia   . Atrial fibrillation ([place])   . Cancer [facility])    Testicular  . Hemochromatosis 10/25/2014  . Hx of basal cell carcinoma 2008   R. ant. zygomatic/sideburn 2008, L preauricular 2019  . Hypertension   . Prostate cancer ([place])     PAST SURGICAL HISTORY :   Past Surgical History:  Procedure Laterality Date  . ANTERIOR APPROACH HEMI HIP ARTHROPLASTY Left 05/28/2019   Procedure: ANTERIOR APPROACH HEMI HIP ARTHROPLASTY;  Surgeon: Hessie Knows, MD;  Location: [facility] ORS;  Service: Orthopedics;  Laterality: Left;  . APPENDECTOMY    . CHOLECYSTECTOMY    . HAND SURGERY Bilateral   . INNER EAR SURGERY    . KYPHOPLASTY N/A 12/14/2015   Procedure: KYPHOPLASTY  L2;  Surgeon: Hessie Knows, MD;  Location: [facility] ORS;  Service: Orthopedics;  Laterality: N/A;  . PROSTATE SURGERY    . SURGERY SCROTAL / TESTICULAR Right     FAMILY HISTORY :   Family History  Problem Relation Age of Onset  . Hypertension Mother     SOCIAL HISTORY:   Social History   Tobacco Use  . Smoking status: Former Smoker    Types: Cigarettes    Quit date: 03/22/1958    Years since quitting: 61.7  . Smokeless tobacco: Never Used  Substance Use Topics  . Alcohol use: No  . Drug use: No    ALLERGIES:  is allergic to levofloxacin.  MEDICATIONS:  Current Outpatient Medications  Medication Sig Dispense Refill  . acetaminophen (TYLENOL) 325 MG tablet Take 2 tablets (650 mg  total) by mouth every 6 (six) hours.    [name] aspirin EC 81 MG tablet Take 81 mg by mouth once.     [name] CALCIUM-MAGNESUIUM-ZINC 333-133-8.3 MG TABS Take 1 tablet by mouth daily.    . diclofenac Sodium (VOLTAREN) 1 % GEL Apply topically 4 (four) times daily.    [name] docusate sodium (COLACE) 100 MG capsule Take 100 mg by mouth at bedtime.    . Lido-Capsaicin-Men-Methyl Sal (MEDI-PATCH-LIDOCAINE EX) Apply topically.    . Potassium 99 MG TABS Take 1 tablet by mouth  daily.     . Turmeric 500 MG TABS Take 500 mg by mouth at bedtime.    . vitamin B-12 (CYANOCOBALAMIN) 1000 MCG tablet Take 1,000 mcg by mouth daily.    . Vitamin D, Cholecalciferol, 1000 units CAPS Take 1,000 Units by mouth daily.    [name] lisinopril-hydrochlorothiazide (ZESTORETIC) 10-12.5 MG tablet Take 1 tablet by mouth daily.  (Patient not taking: Reported on 12/13/2019)    . oxyCODONE (OXY IR/ROXICODONE) 5 MG immediate release tablet Take 1 tablet (5 mg total) by mouth every 4 (four) hours as needed for severe pain. (Patient not taking: Reported on 12/13/2019) 42 tablet 0  . traMADol (ULTRAM) 50 MG tablet Take 50 mg by mouth every 6 (six) hours as needed.  (Patient not taking: Reported on 12/13/2019)     No current facility-administered medications for this visit.    PHYSICAL EXAMINATION:   BP (!) 146/79 (BP Location: Left Arm, Patient Position: Sitting, Cuff Size: Normal)   Pulse 72   Temp 97.6 F (36.4 C) (Tympanic)   Resp 16   Ht _0  (1.753 m)   Wt 143 lb (64.9 kg)   SpO2 98%   BMI 21.12 kg/m   Filed Weights   12/13/19 0901  Weight: 143 lb (64.9 kg)    Physical Exam Constitutional:      Comments: Elderly male patient; he is in a wheelchair.  He is accompanied by his daughter.  Looks younger than his stated age.  HENT:     Head: Normocephalic and atraumatic.     Mouth/Throat:     Pharynx: No oropharyngeal exudate.  Eyes:     Pupils: Pupils are equal, round, and reactive to light.  Cardiovascular:     Rate and Rhythm: Normal rate and regular rhythm.  Pulmonary:     Effort: Pulmonary effort is normal. No respiratory distress.     Breath sounds: Normal breath sounds. No wheezing.  Abdominal:     General: Bowel sounds are normal. There is no distension.     Palpations: Abdomen is soft. There is no mass.     Tenderness: There is no abdominal tenderness. There is no guarding or rebound.  Musculoskeletal:        General: No tenderness. Normal range of motion.     Cervical  back: Normal range of motion and neck supple.  Skin:    General: Skin is warm.  Neurological:     Mental Status: He is alert and oriented to person, place, and time.  Psychiatric:        Mood and Affect: Affect normal.      LABORATORY DATA:  I have reviewed the data as listed    Component Value Date/Time   NA 141 12/13/2019 0847   NA 142 05/06/2013 0158   K 3.8 12/13/2019 0847   K 3.7 05/06/2013 0158   CL 108 12/13/2019 0847   CL 112 (H) 05/06/2013 0158   CO2 26 12/13/2019  0847   CO2 24 05/06/2013 0158   GLUCOSE 130 (H) 12/13/2019 0847   GLUCOSE 95 05/06/2013 0158   BUN 37 (H) 12/13/2019 0847   BUN 32 (H) 05/06/2013 0158   CREATININE 0.92 12/13/2019 0847   CREATININE 1.22 11/25/2013 1332   CALCIUM 8.9 12/13/2019 0847   CALCIUM 9.1 05/06/2013 0158   PROT 6.5 12/13/2019 0847   PROT 6.1 (L) 05/06/2013 0158   ALBUMIN 4.2 12/13/2019 0847   ALBUMIN 3.5 05/06/2013 0158   AST 17 12/13/2019 0847   AST 22 05/06/2013 0158   ALT 15 12/13/2019 0847   ALT 18 05/06/2013 0158   ALKPHOS 55 12/13/2019 0847   ALKPHOS 60 05/06/2013 0158   BILITOT 1.4 (H) 12/13/2019 0847   BILITOT 1.4 (H) 05/06/2013 0158   GFRNONAA >60 12/13/2019 0847   GFRNONAA 52 (L) 11/25/2013 1332   GFRAA >60 12/13/2019 0847   GFRAA >60 11/25/2013 1332    No results found for: SPEP, UPEP  Lab Results  Component Value Date   WBC 7.6 12/13/2019   NEUTROABS 4.7 12/13/2019   HGB 8.9 (L) 12/13/2019   HCT 26.4 (L) 12/13/2019   MCV 94.6 12/13/2019   PLT 357 12/13/2019      Chemistry      Component Value Date/Time   NA 141 12/13/2019 0847   NA 142 05/06/2013 0158   K 3.8 12/13/2019 0847   K 3.7 05/06/2013 0158   CL 108 12/13/2019 0847   CL 112 (H) 05/06/2013 0158   CO2 26 12/13/2019 0847   CO2 24 05/06/2013 0158   BUN 37 (H) 12/13/2019 0847   BUN 32 (H) 05/06/2013 0158   CREATININE 0.92 12/13/2019 0847   CREATININE 1.22 11/25/2013 1332      Component Value Date/Time   CALCIUM 8.9 12/13/2019 0847    CALCIUM 9.1 05/06/2013 0158   ALKPHOS 55 12/13/2019 0847   ALKPHOS 60 05/06/2013 0158   AST 17 12/13/2019 0847   AST 22 05/06/2013 0158   ALT 15 12/13/2019 0847   ALT 18 05/06/2013 0158   BILITOT 1.4 (H) 12/13/2019 0847   BILITOT 1.4 (H) 05/06/2013 0158       RADIOGRAPHIC STUDIES: I have personally reviewed the radiological images as listed and agreed with the findings in the report. No results found.   ASSESSMENT & PLAN:   MDS (myelodysplastic syndrome), low grade (HCC) #  Anemia ?  Likely secondary to MDS. [no previous BMBx]- Declines Bone marrow Biopsy. Hb- 8.9;  Proceed with Retacrit 40,K q 2 W.   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 STABLE; May 2021- Ferritin- 1200. Repeat irons studies at next visit  #I spoke to patient's daughter  regarding above plan.  She is in agreement.   # # I discussed regarding Covid-19 precautions.  I reviewed the vaccine effectiveness and potential side effects in detail. Pt is unvaccinated to [place]; recommend vaccination. Checking with [facility] facilities.   # DISPOSITION: Flu shot today # Retacrit today; NO transfusion today.   # in 3 weeks; H&H; Hold tube; posisble Retacrit OR 1 unit PRBC transfusion  # Follow up in 6 weeks MD; cbc/cmp;LDH; Iron studies/ferritin;HOLD tube; -possible retacrit OR 1 unit PRBC transfusion -Dr.B     Cammie Sickle, MD 12/13/2019 11:08 AM

## 2019-12-13 NOTE — Assessment & Plan Note (Addendum)
#  Anemia ?  Likely secondary to MDS. [no previous BMBx]- Declines Bone marrow Biopsy. Hb- 8.9;  Proceed with Retacrit 40,K q 2 W.   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 STABLE; May 2021- Ferritin- 1200. Repeat irons studies at next visit  #I spoke to patient's daughter  regarding above plan.  She is in agreement.   # # I discussed regarding Covid-19 precautions.  I reviewed the vaccine effectiveness and potential side effects in detail. Pt is unvaccinated to [place]; recommend vaccination. Checking with [facility] facilities.   # DISPOSITION: Flu shot today # Retacrit today; NO transfusion today.   # in 3 weeks; H&H; Hold tube; posisble Retacrit OR 1 unit PRBC transfusion  # Follow up in 6 weeks MD; cbc/cmp;LDH; Iron studies/ferritin;HOLD tube; -possible retacrit OR 1 unit PRBC transfusion -Dr.B

## 2019-12-13 NOTE — Addendum Note (Signed)
Addended by: Delice Bison E on: 12/13/2019 03:32 PM   Modules accepted: Orders

## 2019-12-13 NOTE — Progress Notes (Signed)
Pt expressed concerns about his balance. Has been feeling weak and dizzy more lately. Bilateral knee pain making it hard for him to walk.

## 2019-12-20 ENCOUNTER — Ambulatory Visit
Admission: RE | Admit: 2019-12-20 | Discharge: 2019-12-20 | Disposition: A | Discharge status: Home / Self Care | Payer: Medicare Other | Source: Ambulatory Visit | Attending: Orthopedic Surgery | Admitting: Orthopedic Surgery

## 2019-12-20 ENCOUNTER — Other Ambulatory Visit: Payer: Self-pay | Admitting: Orthopedic Surgery

## 2019-12-20 ENCOUNTER — Other Ambulatory Visit: Payer: Self-pay

## 2019-12-20 DIAGNOSIS — S32030A Wedge compression fracture of third lumbar vertebra, initial encounter for closed fracture: Secondary | ICD-10-CM

## 2019-12-21 ENCOUNTER — Other Ambulatory Visit: Payer: Self-pay | Admitting: Orthopedic Surgery

## 2019-12-21 ENCOUNTER — Encounter
Admission: RE | Admit: 2019-12-21 | Discharge: 2019-12-21 | Disposition: A | Discharge status: Home / Self Care | Payer: Medicare Other | Source: Ambulatory Visit | Attending: Orthopedic Surgery | Admitting: Orthopedic Surgery

## 2019-12-21 HISTORY — DX: Unspecified osteoarthritis, unspecified site: M19.90

## 2019-12-21 NOTE — Patient Instructions (Signed)
Your procedure is scheduled on: 12-23-19 THURSDAY Report to Day Surgery on the 2nd floor of the [facility]. To find out your arrival time, please call [phone] between 1PM - 3PM on: 12-22-19 [facility]  REMEMBER: Instructions that are not followed completely may result in serious medical risk, up to and including death; or upon the discretion of your surgeon and anesthesiologist your surgery may need to be rescheduled.  Do not eat food after midnight the night before surgery.  No gum chewing, lozengers or hard candies.  You may however, drink CLEAR liquids up to 2 hours before you are scheduled to arrive for your surgery. Do not drink anything within 2 hours of your scheduled arrival time.  Clear liquids include: - water  - apple juice without pulp - gatorade (not RED) - black coffee or tea (Do NOT add milk or creamers to the coffee or tea) Do NOT drink anything that is not on this list.   In addition, your doctor has ordered for you to drink the provided  Ensure Pre-Surgery Clear Carbohydrate Drink  Drinking this carbohydrate drink up to two hours before surgery helps to reduce insulin resistance and improve patient outcomes. Please complete drinking 2 hours prior to scheduled arrival time.  TAKE THESE MEDICATIONS THE MORNING OF SURGERY WITH A SIP OF WATER: -OXYCODONE   Follow recommendations from Cardiologist, Pulmonologist or PCP regarding stopping Aspirin, Coumadin, Plavix, Eliquis, Pradaxa, or Pletal-CALL DR MENZ'S OFFICE ABOUT STOPPING YOUR ASPIRIN  One week prior to surgery: Stop Anti-inflammatories (NSAIDS) such as Advil, Aleve, Ibuprofen, Motrin, Naproxen, Naprosyn and Aspirin based products such as Excedrin, Goodys Powder, BC Powder-TYLENOL/HYDROCODONE/OXYCODONE OK TO TAKE  Stop ANY OVER THE COUNTER supplements until after surgery. (You may continue taking Tylenol, Vitamin D, Vitamin B, and multivitamin.)-STOP YOUR TURMERIC NOW-YOU MAY RESUME AFTER SURGERY  No  Alcohol for 24 hours before or after surgery.  No Smoking including e-cigarettes for 24 hours prior to surgery.  No chewable tobacco products for at least 6 hours prior to surgery.  No nicotine patches on the day of surgery.  Do not use any "recreational" drugs for at least a week prior to your surgery.  Please be advised that the combination of cocaine and anesthesia may have negative outcomes, up to and including death. If you test positive for cocaine, your surgery will be cancelled.  On the morning of surgery brush your teeth with toothpaste and water, you may rinse your mouth with mouthwash if you wish. Do not swallow any toothpaste or mouthwash.  Do not wear jewelry, make-up, hairpins, clips or nail polish.  Do not wear lotions, powders, or perfumes.   Do not shave 48 hours prior to surgery.   Contact lenses, hearing aids and dentures may not be worn into surgery.  Do not bring valuables to the hospital. [facility] is not responsible for any missing/lost belongings or valuables.   Use CHG wipes as directed on instruction sheet.  Notify your doctor if there is any change in your medical condition (cold, fever, infection).  Wear comfortable clothing (specific to your surgery type) to the hospital.  Plan for stool softeners for home use; pain medications have a tendency to cause constipation. You can also help prevent constipation by eating foods high in fiber such as fruits and vegetables and drinking plenty of fluids as your diet allows.  After surgery, you can help prevent lung complications by doing breathing exercises.  Take deep breaths and cough every 1-2 hours. Your doctor may  order a device called an Incentive Spirometer to help you take deep breaths. When coughing or sneezing, hold a pillow firmly against your incision with both hands. This is called "splinting." Doing this helps protect your incision. It also decreases belly discomfort.  If you are being admitted to  the hospital overnight, leave your suitcase in the car. After surgery it may be brought to your room.  If you are being discharged the day of surgery, you will not be allowed to drive home. You will need a responsible adult (18 years or older) to drive you home and stay with you that night.   If you are taking public transportation, you will need to have a responsible adult (18 years or older) with you. Please confirm with your physician that it is acceptable to use public transportation.   Please call the [facility] at [phone] if you have any questions about these instructions.  Visitation Policy:  Patients undergoing a surgery or procedure may have one family member or support person with them as long as that person is not COVID-19 positive or experiencing its symptoms.  That person may remain in the waiting area during the procedure.  Inpatient Visitation Update:   In an effort to ensure the safety of our team members and our patients, we are implementing a change to our visitation policy:  Effective Monday, Aug. 9, at 7 a.m., inpatients will be allowed one support person.  o The support person may change daily.  o The support person must pass our screening, gel in and out, and wear a mask at all times, including in the patient's room.  o Patients must also wear a mask when staff or their support person are in the room.  o Masking is required regardless of vaccination status.  Systemwide, no visitors 17 or younger.

## 2019-12-22 ENCOUNTER — Other Ambulatory Visit
Admission: RE | Admit: 2019-12-22 | Discharge: 2019-12-22 | Disposition: A | Discharge status: Home / Self Care | Payer: Medicare Other | Source: Ambulatory Visit | Attending: Orthopedic Surgery | Admitting: Orthopedic Surgery

## 2019-12-22 ENCOUNTER — Other Ambulatory Visit: Payer: Self-pay

## 2019-12-22 DIAGNOSIS — Z01818 Encounter for other preprocedural examination: Secondary | ICD-10-CM | POA: Diagnosis present

## 2019-12-22 DIAGNOSIS — Z20822 Contact with and (suspected) exposure to covid-19: Secondary | ICD-10-CM | POA: Diagnosis not present

## 2019-12-22 LAB — SARS CORONAVIRUS 2 (TAT 6-24 HRS): SARS Coronavirus 2: NEGATIVE

## 2019-12-23 ENCOUNTER — Ambulatory Visit
Admission: RE | Admit: 2019-12-23 | Discharge: 2019-12-23 | Disposition: A | Discharge status: Home / Self Care | Payer: Medicare Other | Attending: Orthopedic Surgery | Admitting: Orthopedic Surgery

## 2019-12-23 ENCOUNTER — Encounter: Payer: Self-pay | Admitting: Orthopedic Surgery

## 2019-12-23 ENCOUNTER — Ambulatory Visit: Payer: Medicare Other | Admitting: Urgent Care

## 2019-12-23 ENCOUNTER — Encounter
Admission: RE | Disposition: A | Discharge status: Home / Self Care | Payer: Self-pay | Source: Home / Self Care | Attending: Orthopedic Surgery

## 2019-12-23 ENCOUNTER — Ambulatory Visit: Payer: Medicare Other

## 2019-12-23 ENCOUNTER — Other Ambulatory Visit: Payer: Self-pay

## 2019-12-23 DIAGNOSIS — Z881 Allergy status to other antibiotic agents status: Secondary | ICD-10-CM | POA: Diagnosis not present

## 2019-12-23 DIAGNOSIS — Z808 Family history of malignant neoplasm of other organs or systems: Secondary | ICD-10-CM | POA: Insufficient documentation

## 2019-12-23 DIAGNOSIS — Z96642 Presence of left artificial hip joint: Secondary | ICD-10-CM | POA: Diagnosis not present

## 2019-12-23 DIAGNOSIS — X58XXXA Exposure to other specified factors, initial encounter: Secondary | ICD-10-CM | POA: Diagnosis not present

## 2019-12-23 DIAGNOSIS — Z8546 Personal history of malignant neoplasm of prostate: Secondary | ICD-10-CM | POA: Diagnosis not present

## 2019-12-23 DIAGNOSIS — Z79899 Other long term (current) drug therapy: Secondary | ICD-10-CM | POA: Insufficient documentation

## 2019-12-23 DIAGNOSIS — Z8249 Family history of ischemic heart disease and other diseases of the circulatory system: Secondary | ICD-10-CM | POA: Diagnosis not present

## 2019-12-23 DIAGNOSIS — S22080A Wedge compression fracture of T11-T12 vertebra, initial encounter for closed fracture: Secondary | ICD-10-CM | POA: Insufficient documentation

## 2019-12-23 DIAGNOSIS — I4891 Unspecified atrial fibrillation: Secondary | ICD-10-CM | POA: Insufficient documentation

## 2019-12-23 DIAGNOSIS — Z9049 Acquired absence of other specified parts of digestive tract: Secondary | ICD-10-CM | POA: Insufficient documentation

## 2019-12-23 DIAGNOSIS — I10 Essential (primary) hypertension: Secondary | ICD-10-CM | POA: Insufficient documentation

## 2019-12-23 DIAGNOSIS — D649 Anemia, unspecified: Secondary | ICD-10-CM | POA: Diagnosis not present

## 2019-12-23 DIAGNOSIS — Z8547 Personal history of malignant neoplasm of testis: Secondary | ICD-10-CM | POA: Diagnosis not present

## 2019-12-23 DIAGNOSIS — S32030A Wedge compression fracture of third lumbar vertebra, initial encounter for closed fracture: Secondary | ICD-10-CM | POA: Insufficient documentation

## 2019-12-23 DIAGNOSIS — Z419 Encounter for procedure for purposes other than remedying health state, unspecified: Secondary | ICD-10-CM

## 2019-12-23 DIAGNOSIS — Z87891 Personal history of nicotine dependence: Secondary | ICD-10-CM | POA: Diagnosis not present

## 2019-12-23 DIAGNOSIS — I340 Nonrheumatic mitral (valve) insufficiency: Secondary | ICD-10-CM | POA: Insufficient documentation

## 2019-12-23 DIAGNOSIS — Z801 Family history of malignant neoplasm of trachea, bronchus and lung: Secondary | ICD-10-CM | POA: Insufficient documentation

## 2019-12-23 DIAGNOSIS — Z7982 Long term (current) use of aspirin: Secondary | ICD-10-CM | POA: Insufficient documentation

## 2019-12-23 HISTORY — PX: KYPHOPLASTY: SHX5884

## 2019-12-23 SURGERY — KYPHOPLASTY
Anesthesia: General

## 2019-12-23 MED ORDER — ACETAMINOPHEN 10 MG/ML IV SOLN
INTRAVENOUS | Status: AC
  Filled 2019-12-23: qty 100

## 2019-12-23 MED ORDER — ONDANSETRON HCL 4 MG/2ML IJ SOLN: 4.0000 mg | Freq: Once | INTRAMUSCULAR | Status: DC | PRN

## 2019-12-23 MED ORDER — FENTANYL CITRATE (PF) 100 MCG/2ML IJ SOLN
25.0000 ug | INTRAMUSCULAR | Status: DC | PRN
  Administered 2019-12-23: 25 ug via INTRAVENOUS

## 2019-12-23 MED ORDER — LIDOCAINE HCL (CARDIAC) PF 100 MG/5ML IV SOSY
PREFILLED_SYRINGE | INTRAVENOUS | Status: DC | PRN
  Administered 2019-12-23: 60 mg via INTRAVENOUS

## 2019-12-23 MED ORDER — METOCLOPRAMIDE HCL 5 MG/ML IJ SOLN: 5.0000 mg | Freq: Three times a day (TID) | INTRAMUSCULAR | Status: DC | PRN

## 2019-12-23 MED ORDER — PROPOFOL 10 MG/ML IV BOLUS
INTRAVENOUS | Status: AC
  Filled 2019-12-23: qty 20

## 2019-12-23 MED ORDER — ACETAMINOPHEN 10 MG/ML IV SOLN
INTRAVENOUS | Status: DC | PRN
  Administered 2019-12-23: 1000 mg via INTRAVENOUS

## 2019-12-23 MED ORDER — CHLORHEXIDINE GLUCONATE 0.12 % MT SOLN
OROMUCOSAL | Status: AC
  Administered 2019-12-23: 15 mL
  Filled 2019-12-23: qty 15

## 2019-12-23 MED ORDER — LIDOCAINE HCL (PF) 2 % IJ SOLN
INTRAMUSCULAR | Status: AC
  Filled 2019-12-23: qty 5

## 2019-12-23 MED ORDER — LIDOCAINE HCL (PF) 1 % IJ SOLN
INTRAMUSCULAR | Status: AC
  Filled 2019-12-23: qty 60

## 2019-12-23 MED ORDER — ONDANSETRON HCL 4 MG PO TABS: 4.0000 mg | ORAL_TABLET | Freq: Four times a day (QID) | ORAL | Status: DC | PRN

## 2019-12-23 MED ORDER — PROPOFOL 500 MG/50ML IV EMUL
INTRAVENOUS | Status: DC | PRN
  Administered 2019-12-23: 30 ug/kg/min via INTRAVENOUS

## 2019-12-23 MED ORDER — FENTANYL CITRATE (PF) 100 MCG/2ML IJ SOLN
INTRAMUSCULAR | Status: AC
  Administered 2019-12-23: 25 ug via INTRAVENOUS
  Filled 2019-12-23: qty 2

## 2019-12-23 MED ORDER — BUPIVACAINE-EPINEPHRINE (PF) 0.5% -1:200000 IJ SOLN
INTRAMUSCULAR | Status: DC | PRN
  Administered 2019-12-23: 20 mL via PERINEURAL
  Administered 2019-12-23: 10 mL via PERINEURAL

## 2019-12-23 MED ORDER — BUPIVACAINE HCL (PF) 0.5 % IJ SOLN
INTRAMUSCULAR | Status: AC
  Filled 2019-12-23: qty 30

## 2019-12-23 MED ORDER — LACTATED RINGERS IV SOLN: INTRAVENOUS | Status: DC | PRN

## 2019-12-23 MED ORDER — CEFAZOLIN SODIUM-DEXTROSE 1-4 GM/50ML-% IV SOLN
1.0000 g | INTRAVENOUS | Status: AC
  Administered 2019-12-23: 1 g via INTRAVENOUS

## 2019-12-23 MED ORDER — LIDOCAINE HCL 1 % IJ SOLN
INTRAMUSCULAR | Status: DC | PRN
  Administered 2019-12-23 (×2): 10 mL

## 2019-12-23 MED ORDER — EPINEPHRINE PF 1 MG/ML IJ SOLN
INTRAMUSCULAR | Status: AC
  Filled 2019-12-23: qty 3

## 2019-12-23 MED ORDER — CEFAZOLIN SODIUM-DEXTROSE 1-4 GM/50ML-% IV SOLN
INTRAVENOUS | Status: AC
  Filled 2019-12-23: qty 50

## 2019-12-23 MED ORDER — METOCLOPRAMIDE HCL 10 MG PO TABS: 5.0000 mg | ORAL_TABLET | Freq: Three times a day (TID) | ORAL | Status: DC | PRN

## 2019-12-23 MED ORDER — SODIUM CHLORIDE 0.9 % IV SOLN: INTRAVENOUS | Status: DC

## 2019-12-23 MED ORDER — FAMOTIDINE 20 MG PO TABS: 20.0000 mg | ORAL_TABLET | Freq: Once | ORAL | Status: AC

## 2019-12-23 MED ORDER — ONDANSETRON HCL 4 MG/2ML IJ SOLN: 4.0000 mg | Freq: Four times a day (QID) | INTRAMUSCULAR | Status: DC | PRN

## 2019-12-23 MED ORDER — FAMOTIDINE 20 MG PO TABS
ORAL_TABLET | ORAL | Status: AC
  Administered 2019-12-23: 20 mg via ORAL
  Filled 2019-12-23: qty 1

## 2019-12-23 MED ORDER — PROPOFOL 10 MG/ML IV BOLUS
INTRAVENOUS | Status: DC | PRN
  Administered 2019-12-23 (×2): 10 mg via INTRAVENOUS
  Administered 2019-12-23: 30 mg via INTRAVENOUS
  Administered 2019-12-23: 10 mg via INTRAVENOUS

## 2019-12-23 SURGICAL SUPPLY — 22 items
ADH SKN CLS APL DERMABOND .7 (GAUZE/BANDAGES/DRESSINGS) ×1
CEMENT KYPHON CX01A KIT/MIXER (Cement) ×3 IMPLANT
COVER WAND RF STERILE (DRAPES) ×3 IMPLANT
DERMABOND ADVANCED (GAUZE/BANDAGES/DRESSINGS) ×2
DERMABOND ADVANCED .7 DNX12 (GAUZE/BANDAGES/DRESSINGS) ×1 IMPLANT
DEVICE BIOPSY BONE KYPH (INSTRUMENTS) ×2 IMPLANT
DEVICE BIOPSY BONE KYPHX (INSTRUMENTS) ×3 IMPLANT
DRAPE C-ARM XRAY 36X54 (DRAPES) ×3 IMPLANT
DURAPREP 26ML APPLICATOR (WOUND CARE) ×3 IMPLANT
FEE RENTAL RFA GENERATOR (MISCELLANEOUS) IMPLANT
GLOVE SURG SYN 9.0  PF PI (GLOVE) ×2
GLOVE SURG SYN 9.0 PF PI (GLOVE) ×1 IMPLANT
GOWN SRG 2XL LVL 4 RGLN SLV (GOWNS) ×1 IMPLANT
GOWN STRL NON-REIN 2XL LVL4 (GOWNS) ×3
GOWN STRL REUS W/ TWL LRG LVL3 (GOWN DISPOSABLE) ×1 IMPLANT
GOWN STRL REUS W/TWL LRG LVL3 (GOWN DISPOSABLE) ×3
PACK KYPHOPLASTY (MISCELLANEOUS) ×3 IMPLANT
RENTAL RFA  GENERATOR (MISCELLANEOUS)
RENTAL RFA GENERATOR (MISCELLANEOUS) IMPLANT
STRAP SAFETY 5IN WIDE (MISCELLANEOUS) ×3 IMPLANT
SWABSTK COMLB BENZOIN TINCTURE (MISCELLANEOUS) ×3 IMPLANT
TRAY KYPHOPAK 15/2 EXPRESS (KITS) ×2 IMPLANT

## 2019-12-23 NOTE — Anesthesia Preprocedure Evaluation (Signed)
Anesthesia Evaluation  Patient identified by MRN, date of birth, ID band Patient awake    Reviewed: Allergy & Precautions, NPO status , Patient's Chart, lab work & pertinent test results  History of Anesthesia Complications Negative for: history of anesthetic complications  Airway Mallampati: III       Dental   Pulmonary neg sleep apnea, neg COPD, Not current smoker, former smoker,           Cardiovascular hypertension (pr off meds since 3/21), (-) Past MI and (-) CHF (-) dysrhythmias (-) Valvular Problems/Murmurs     Neuro/Psych neg Seizures    GI/Hepatic Neg liver ROS, neg GERD  ,  Endo/Other  neg diabetes  Renal/GU negative Renal ROS     Musculoskeletal   Abdominal   Peds  Hematology  (+) anemia ,   Anesthesia Other Findings   Reproductive/Obstetrics                             Anesthesia Physical Anesthesia Plan  ASA: III  Anesthesia Plan: General   Post-op Pain Management:    Induction: Intravenous  PONV Risk Score and Plan: 2 and Propofol infusion and TIVA  Airway Management Planned: Nasal Cannula and Simple Face Mask  Additional Equipment:   Intra-op Plan:   Post-operative Plan:   Informed Consent:     Interpreter used for interveiw  Plan Discussed with:   Anesthesia Plan Comments:         Anesthesia Quick Evaluation

## 2019-12-23 NOTE — Transfer of Care (Signed)
Immediate Anesthesia Transfer of Care Note  Patient: Gerald [name]  Procedure(s) Performed: KYPHOPLASTY (N/A )  Patient Location: PACU  Anesthesia Type:General  Level of Consciousness: alert  and oriented  Airway & Oxygen Therapy: Patient Spontanous Breathing and Patient connected to nasal cannula oxygen  Post-op Assessment: Report given to RN and Post -op Vital signs reviewed and stable  Post vital signs: Reviewed and stable  Last Vitals:  Vitals Value Taken Time  BP    Temp    Pulse 83 12/23/19 0852  Resp 17 12/23/19 0851  SpO2 97 % 12/23/19 0852  Vitals shown include unvalidated device data.  Last Pain:  Vitals:   12/23/19 0845  TempSrc:   PainSc: Asleep         Complications: No complications documented.

## 2019-12-23 NOTE — Anesthesia Postprocedure Evaluation (Signed)
Anesthesia Post Note  Patient: Gerald [name]  Procedure(s) Performed: KYPHOPLASTY (N/A )  Patient location during evaluation: PACU Anesthesia Type: General Level of consciousness: awake and alert Pain management: pain level controlled Vital Signs Assessment: post-procedure vital signs reviewed and stable Respiratory status: spontaneous breathing and respiratory function stable Cardiovascular status: stable Anesthetic complications: no   No complications documented.   Last Vitals:  Vitals:   12/23/19 0653 12/23/19 0845  BP: (!) 170/71 140/66  Pulse: 85 89  Resp: 18 19  Temp: (!) 36.4 C 36.5 C  SpO2: 94% 100%    Last Pain:  Vitals:   12/23/19 0845  TempSrc:   PainSc: Asleep                 Loneta Tamplin K

## 2019-12-23 NOTE — OR Nursing (Signed)
Per Dr. Rudene Christians, secure chat, ok for pt to resume aspirin today.  Added to discharge instructions/med section.

## 2019-12-23 NOTE — Op Note (Signed)
Date December 23, 2019  time 8:44 AM   PATIENT:  Gerald [name]   PRE-OPERATIVE DIAGNOSIS:  closed wedge compression fracture of T12   POST-OPERATIVE DIAGNOSIS:  closed wedge compression fracture of T12   PROCEDURE:  Procedure(s): KYPHOPLASTY T12  SURGEON: Laurene Footman, MD   ASSISTANTS: None   ANESTHESIA:   local and MAC   EBL:  No intake/output data recorded.   BLOOD ADMINISTERED:none   DRAINS: none    LOCAL MEDICATIONS USED:  MARCAINE    and XYLOCAINE    SPECIMEN:   None   DISPOSITION OF SPECIMEN:  Not applicable   COUNTS:  YES   TOURNIQUET:  * No tourniquets in log *   IMPLANTS: Bone cement   DICTATION: .Dragon Dictation  patient was brought to the operating room and after adequate anesthesia was obtained the patient was placed prone.  C arm was brought in in good visualization of the affected level obtained on both AP and lateral projections.  After patient identification and timeout procedures were completed, local anesthetic was infiltrated with 10 cc 1% Xylocaine infiltrated subcutaneously.  This is done the area on the each side of the planned approach.  The back was then prepped and draped in the usual sterile manner and repeat timeout procedure carried out.  A spinal needle was brought down to the pedicle on the each side of  T12 and a 50-50 mix of 1% Xylocaine half percent Sensorcaine with epinephrine total of 20 cc injected on each side.  After allowing this to set a small incision was made and the trocar was advanced into the vertebral body in an extrapedicular fashion.  Biopsy was not obtained despite attempting on both sides Drilling was carried out balloon inserted with inflation to  2.0 cc on the right and 1-1/2 cc on the left.  When the cement was appropriate consistency 3-1/2 cc were injected on the right and 2.0 cc on the left into the vertebral body without extravasation, good fill superior to inferior endplates and from right to left sides along the  inferior endplate.  After the cement had set the trochar was removed and permanent C-arm views obtained.  The wound was closed with Dermabond followed by Band-Aid   PLAN OF CARE:  Discharge home after recovery room   PATIENT DISPOSITION:  PACU - hemodynamically stable.

## 2019-12-23 NOTE — Anesthesia Procedure Notes (Signed)
Date/Time: 12/23/2019 8:00 AM Performed by: Allean Found, CRNA Pre-anesthesia Checklist: Emergency Drugs available, Suction available, Timeout performed, Patient identified and Patient being monitored Oxygen Delivery Method: Nasal cannula

## 2019-12-23 NOTE — Discharge Instructions (Addendum)
Take it easy today and try to start walking more tomorrow. Try to avoid bending at the waist and lifting anything over 5 pounds for 2 weeks then resume normal activities Pain medicine as previously directed. Remove Band-Aids on Saturday then okay to shower Call office if you are having any problems.   AMBULATORY SURGERY  DISCHARGE INSTRUCTIONS   1) The drugs that you were given will stay in your system until tomorrow so for the next 24 hours you should not:  A) Drive an automobile B) Make any legal decisions C) Drink any alcoholic beverage   2) You may resume regular meals tomorrow.  Today it is better to start with liquids and gradually work up to solid foods.  You may eat anything you prefer, but it is better to start with liquids, then soup and crackers, and gradually work up to solid foods.   3) Please notify your doctor immediately if you have any unusual bleeding, trouble breathing, redness and pain at the surgery site, drainage, fever, or pain not relieved by medication.    4) Additional Instructions:        Please contact your physician with any problems or Same Day Surgery at [phone], Monday through Friday 6 am to 4 pm, or [facility] number at [phone].

## 2019-12-23 NOTE — H&P (Signed)
Chief Complaint  Patient presents with  . Follow-up  Back and hip pain    History of the Present Illness: Gerald [name] is a 86 y.o. male here today for evaluation of back and hip pain. He has a history of prior compression fracture, as well as left hip fracture with kyphoplasty and hemiarthroplasty respectively. The patient states he has had excruciating pain since his last visit. He has a lift chair. The patient states he has pain in his legs and ankles when he is in bed. The patient states his pain is not as severe as it was when he had fractures. He has difficulty getting in and out of bed. The patient has been taking hydrocodone. He has shooting pain down his back and legs  The patient does not have a pacemaker.  I have reviewed past medical, surgical, social and family history, and allergies as documented in the EMR.  Past Medical History: Past Medical History:  Diagnosis Date  . Atrial fibrillation (CMS-HCC)  . Cataracts, bilateral  . Hypertension  . Moderate mitral regurgitation  . Prostate cancer (CMS-HCC)  . Testicular cancer (CMS-HCC) 1995  sees Dr. Quillian Quince   Past Surgical History: Past Surgical History:  Procedure Laterality Date  . CATARACT EXTRACTION  . CHOLECYSTECTOMY OPEN  . CHOLECYSTECTOMY OPEN 1985  . HERNIA REPAIR SURGERY  . JOINT REPLACEMENT  . Kyphoplasty L2 12/14/2015  Dr.Londen Bok  . Left Hip Hemiarthroplasty Left 05/28/2019  Devann Cribb  . PROSTATE SURGERY May 2006   Past Family History: Family History  Problem Relation Age of Onset  . High blood pressure (Hypertension) Mother  . Skin cancer Mother  . Cancer Father  . Lung cancer Father   Medications: Current Outpatient Medications Ordered in Epic  Medication Sig Dispense Refill  . acetaminophen (TYLENOL) 325 MG tablet Take 2 tablets by mouth every 6 (six) hours as needed  . aspirin 81 MG EC tablet Take 81 mg by mouth once daily.  [name] b complex vitamins capsule Take 1 capsule by mouth once daily.   . cholecalciferol (CHOLECALCIFEROL) 1,000 unit tablet Take by mouth  . cyanocobalamin (VITAMIN B12) 1000 MCG tablet Take 1 tablet by mouth once daily  . docusate calcium (SURFAK) 240 mg capsule Take 1 tablet by mouth 2 (two) times daily.  . potassium gluconate 2.5 mEq Tab Take 1 tablet by mouth once daily  . UNABLE TO FIND Take 1 tablet by mouth once daily. Calcium/Mg/Zinc   . HYDROcodone-acetaminophen (NORCO) 5-325 mg tablet Take 1 tablet by mouth every 4 (four) hours as needed 20 tablet 0   No current Epic-ordered facility-administered medications on file.   Allergies: Allergies  Allergen Reactions  . Levaquin [Levofloxacin] Unknown    Body mass index is 22.24 kg/m.  Review of Systems: A comprehensive 14 point ROS was performed, reviewed, and the pertinent orthopaedic findings are documented in the HPI.  There were no vitals filed for this visit.   General Physical Examination:   General/Constitutional: No apparent distress: well-nourished and well developed. Eyes: Pupils equal, round with synchronous movement. Lungs: Clear to auscultation HEENT: Normal Vascular: No edema, swelling or tenderness, except as noted in detailed exam. Cardiac: Heart rate and rhythm is regular. Integumentary: No impressive skin lesions present, except as noted in detailed exam. Neuro/Psych: Normal mood and affect, oriented to person, place and time.  Musculoskeletal Examination:  On exam, the patient is tender to percussion over T12, L3, and L4.  Radiographs:  AP and lateral x-rays of the lumbar spine were  ordered and personally reviewed today. These show evidence of prior L2 kyphoplasty, as well as T12 and L3 and L4 compression fractures. Comparing these to postop x-rays from 2017, those fractures are all new, unknown acuity.  X-ray Impression: Multiple thoracolumbar compression fractures, age indeterminate, extensive facet arthritis and osteophytes.  Assessment: ICD-10-CM  1. Closed  wedge compression fracture of L3 vertebra, initial encounter (CMS-HCC) S32.030A  2. Acute back pain, unspecified back location, unspecified back pain laterality M54.9   Plan:  The patient has clinical findings of multiple thoracolumbar compression fractures, age indeterminate, and extensive facet arthritis and spurring.  We discussed the patient's x-ray findings. I recommend an MRI of the lumbar spine, which should show T12 as well, and assess. If his pain persists after we get this MRI, we will consider kyphoplasty. I sent in a prescription for hydrocodone.  The patient will follow up as needed.  Attestation: Cordelia Poche, am documenting for [facility], MD utilizing [place].     Electronically signed by Lauris Poag, MD at 12/20/2019 7:02 PM EDT   .subsepquent MRI shows acute T 12 compression fracture, to be repaired today.

## 2020-01-03 ENCOUNTER — Inpatient Hospital Stay: Payer: Medicare Other | Attending: Internal Medicine

## 2020-01-03 ENCOUNTER — Inpatient Hospital Stay: Payer: Medicare Other

## 2020-01-03 ENCOUNTER — Other Ambulatory Visit: Payer: Self-pay

## 2020-01-03 DIAGNOSIS — D469 Myelodysplastic syndrome, unspecified: Secondary | ICD-10-CM | POA: Diagnosis not present

## 2020-01-03 DIAGNOSIS — D4620 Refractory anemia with excess of blasts, unspecified: Secondary | ICD-10-CM

## 2020-01-03 LAB — HEMATOCRIT: HCT: 30.9 % — ABNORMAL LOW (ref 39.0–52.0)

## 2020-01-03 LAB — HEMOGLOBIN: Hemoglobin: 10.2 g/dL — ABNORMAL LOW (ref 13.0–17.0)

## 2020-01-03 LAB — SAMPLE TO BLOOD BANK

## 2020-01-03 NOTE — Progress Notes (Unsigned)
Per Dr. Jacinto Reap and Nira Conn patient does not need transfusion or Retacrit injection today. Patient verbalizes understanding.

## 2020-01-03 NOTE — Progress Notes (Signed)
Per Dr. Jacinto Reap and Nira Conn patient does not need transfusion or Retacrit injection today. Patient verbalizes understanding.

## 2020-01-12 ENCOUNTER — Telehealth: Payer: Self-pay | Admitting: Nurse Practitioner

## 2020-01-12 NOTE — Telephone Encounter (Signed)
Spoke with daughter, [name], to find out which address patient will be at for the Palliative f/u visit scheduled for 01/13/20 @ 2 PM, daughter stated that patient will be at the [place] address.  Daughter stated that patient has MD appointment scheduled for 3:30 tomorrow so we changed the time of the Palliative visit to 1:30 PM to give her enough time to get him to the MD appointment.  Notified Palliative NP.

## 2020-01-13 ENCOUNTER — Other Ambulatory Visit: Payer: Self-pay

## 2020-01-13 ENCOUNTER — Encounter: Payer: Self-pay | Admitting: Nurse Practitioner

## 2020-01-13 ENCOUNTER — Other Ambulatory Visit: Payer: Medicare Other | Admitting: Nurse Practitioner

## 2020-01-13 DIAGNOSIS — Z515 Encounter for palliative care: Secondary | ICD-10-CM

## 2020-01-13 DIAGNOSIS — D469 Myelodysplastic syndrome, unspecified: Secondary | ICD-10-CM

## 2020-01-13 NOTE — Progress Notes (Signed)
[place] Consult Note Telephone: [phone]  Fax: [phone]  PATIENT NAME: Gerald [name] DOB: [id number] MRN: [id number]  PRIMARY CARE PROVIDER:   [name], MD  REFERRING PROVIDER:  [name], MD [address] [facility] [place],  [place] [address]  RESPONSIBLE [place] daughter [phone]  RECOMMENDATIONS and PLAN: 1.CHE:NIDP code, discussed at length,ongoing discussions about aggressive, conservative, comfort measures, extensive discussion about cpr, most form. Will revisit at next [facility] visit or sooner should family choose to change their decision  2.Palliative care encounter; Palliative medicine team will continue to support patient, patient's family, and medical team. Visit consisted of counseling and education dealing with the complex and emotionally intense issues of symptom management and palliative care in the setting of serious and potentially life-threatening illness  3. f/u 2 months or sooner if declines for ongoing monitoring weights, disease progression, support  I spent 60 minutes providing this consultation,  from 1:30pm to 2:30pm. More than 50% of the time in this consultation was spent coordinating communication.   HISTORY OF PRESENT ILLNESS:  Gerald [name] is a 86 y.o. year old male with multiple medical problems including Myelodysplastic syndrome, testicular cancer s/p sgy, prostate cancer, history basal cell carcinoma, hemochromatosis, hypertension, anemia, appendectomy, cholecystectomy, bilateral hands surgery, kyphoplasty.Admitted 10/7 / 2021 for elective surgery with T12 Kyphoplasty on 10 / 7 / 2021 by Dr Rudene Christians. Pain improving still having moderate discomfort along lower lumbar spine. Using topical over the counter medications as narcotics induced confusion and constipation. Recent MRI shows severe multifactorial spinal stenosis at L4 L5. Referred for ESI  injections. 47 / 18 / 2021 / Dr Rogue Bussing did not need transfusion or retacrit injection on this day. LOL Palliative care visit in home for overall monitoring weight, disease progression. I visited Gerald [name] and daughter [name], talked about purpose of visit. We talked about recent kyphoplasty procedure. We talked about symptoms of pain. Gerald [name] endorses he feels like he is back where he started prior to the procedure. We talked about the different areas of pain. [name] talked about the MRI, degenerative disc, arthritis. We talked about pain regimen with Tylenol, Advil, Lidoderm patches, Voltaren gel. We talked about their regular regiment which does seem to improve pain. We talked about pending injections which he has not pride in the past. [name] endorses they have to schedule an appointment for that as they just got the approval. [name] endorses they had a primary care appointment with Dr Kary Kos this afternoon. [name] talked about a sit-to-stand lift that she just discovered that could be helpful for Gerald Hardcastle. We talked about asking Dr Kary Kos at their appointment this afternoon to see if he can write in order for DME equipment. [name] asked about in-home primary providers so that Gerald [name] would not have to go to an appointment for primary care. Theola Sequin to name's Lauretta Grill Nurse practitioner and Alvester Chou Nurse Practitioner contact information for house calls. We talked about Gerald [name] appetite which has improved. We talked about his ambulation for which when he stands he does become dizzy. Gerald Wiedemann endorse is that he is busy most of the time. Gerald Cookston is walking with a different type of walker that seems to be more effective. No recent falls, infections. We talked about recent oncology visit with after Dr Burlene Arnt and that he did not require an injection. Praised Gerald [name] as his nutrition has improved. [name] endorses that he has been eating watermelon  everyday and she wonders if that is what has helped. We talked about medical goals. We talked about role Palliative care and plan of care. We talked about Gerald [name] going between homes as he is now back with [name] in his own home from [place] his other daughters home. We talked about upcoming holidays and that Gerald [name] had just celebrated his birthday. [name] endorses they are going to have a big party for him on Sunday. We talked about role of Palliative care and plan of care. Discussed will follow up in two months if needed or sooner should he declined. Gerald [name] is still seeking active treatments with Oncology, epidurals for pain management. Appointment schedule. Praised Gerald [name] for doing his best to stay healthy. Therapeutic listening, emotional support provided. Questions answered this satisfaction.  Palliative Care was asked to help to continue to address goals of care.   CODE STATUS: Full code  PPS: 40% HOSPICE ELIGIBILITY/DIAGNOSIS: TBD  PAST MEDICAL HISTORY:  Past Medical History:  Diagnosis Date  . Anemia   . Arthritis   . Atrial fibrillation ([place])   . Cancer [facility])    Testicular  . Hemochromatosis 10/25/2014  . Hx of basal cell carcinoma 2008   R. ant. zygomatic/sideburn 2008, L preauricular 2019  . Hypertension   . Prostate cancer [facility])     SOCIAL HX:  Social History   Tobacco Use  . Smoking status: Former Smoker    Packs/day: 1.00    Years: 25.00    Pack years: 25.00    Types: Cigarettes    Quit date: 03/22/1958    Years since quitting: 61.8  . Smokeless tobacco: Never Used  Substance Use Topics  . Alcohol use: No    ALLERGIES:  Allergies  Allergen Reactions  . Levofloxacin Other (See Comments)    Taste alterations for about 1 month after taking med      PHYSICAL EXAM:   General: debilitated, chronically ill, frail appearing, thin,pleasant male Cardiovascular: regular rate and rhythm Pulmonary: clear ant fields Neurological:  generalized weakness; walks with walker  Gerald [name] Gerald Gully, NP

## 2020-01-24 ENCOUNTER — Encounter: Payer: Self-pay | Admitting: Internal Medicine

## 2020-01-24 ENCOUNTER — Inpatient Hospital Stay (HOSPITAL_BASED_OUTPATIENT_CLINIC_OR_DEPARTMENT_OTHER): Payer: Medicare Other | Admitting: Internal Medicine

## 2020-01-24 ENCOUNTER — Inpatient Hospital Stay: Payer: Medicare Other | Attending: Internal Medicine

## 2020-01-24 ENCOUNTER — Inpatient Hospital Stay: Payer: Medicare Other

## 2020-01-24 DIAGNOSIS — Z85828 Personal history of other malignant neoplasm of skin: Secondary | ICD-10-CM | POA: Insufficient documentation

## 2020-01-24 DIAGNOSIS — I10 Essential (primary) hypertension: Secondary | ICD-10-CM | POA: Insufficient documentation

## 2020-01-24 DIAGNOSIS — R531 Weakness: Secondary | ICD-10-CM | POA: Diagnosis not present

## 2020-01-24 DIAGNOSIS — D649 Anemia, unspecified: Secondary | ICD-10-CM

## 2020-01-24 DIAGNOSIS — R5383 Other fatigue: Secondary | ICD-10-CM | POA: Diagnosis not present

## 2020-01-24 DIAGNOSIS — Z87891 Personal history of nicotine dependence: Secondary | ICD-10-CM | POA: Diagnosis not present

## 2020-01-24 DIAGNOSIS — Z79899 Other long term (current) drug therapy: Secondary | ICD-10-CM | POA: Diagnosis not present

## 2020-01-24 DIAGNOSIS — D4620 Refractory anemia with excess of blasts, unspecified: Secondary | ICD-10-CM

## 2020-01-24 DIAGNOSIS — I4891 Unspecified atrial fibrillation: Secondary | ICD-10-CM | POA: Insufficient documentation

## 2020-01-24 DIAGNOSIS — Z8546 Personal history of malignant neoplasm of prostate: Secondary | ICD-10-CM | POA: Diagnosis not present

## 2020-01-24 DIAGNOSIS — D469 Myelodysplastic syndrome, unspecified: Secondary | ICD-10-CM | POA: Insufficient documentation

## 2020-01-24 LAB — CBC WITH DIFFERENTIAL/PLATELET
Abs Immature Granulocytes: 0.03 10*3/uL (ref 0.00–0.07)
Basophils Absolute: 0.1 10*3/uL (ref 0.0–0.1)
Basophils Relative: 1 %
Eosinophils Absolute: 0.4 10*3/uL (ref 0.0–0.5)
Eosinophils Relative: 6 %
HCT: 27.6 % — ABNORMAL LOW (ref 39.0–52.0)
Hemoglobin: 9.3 g/dL — ABNORMAL LOW (ref 13.0–17.0)
Immature Granulocytes: 0 %
Lymphocytes Relative: 22 %
Lymphs Abs: 1.7 10*3/uL (ref 0.7–4.0)
MCH: 31.8 pg (ref 26.0–34.0)
MCHC: 33.7 g/dL (ref 30.0–36.0)
MCV: 94.5 fL (ref 80.0–100.0)
Monocytes Absolute: 0.7 10*3/uL (ref 0.1–1.0)
Monocytes Relative: 9 %
Neutro Abs: 4.6 10*3/uL (ref 1.7–7.7)
Neutrophils Relative %: 62 %
Platelets: 458 10*3/uL — ABNORMAL HIGH (ref 150–400)
RBC: 2.92 MIL/uL — ABNORMAL LOW (ref 4.22–5.81)
RDW: 22.2 % — ABNORMAL HIGH (ref 11.5–15.5)
WBC: 7.6 10*3/uL (ref 4.0–10.5)
nRBC: 0 % (ref 0.0–0.2)

## 2020-01-24 LAB — IRON AND TIBC
Iron: 171 ug/dL (ref 45–182)
Saturation Ratios: 75 % — ABNORMAL HIGH (ref 17.9–39.5)
TIBC: 228 ug/dL — ABNORMAL LOW (ref 250–450)
UIBC: 57 ug/dL

## 2020-01-24 LAB — COMPREHENSIVE METABOLIC PANEL
ALT: 14 U/L (ref 0–44)
AST: 17 U/L (ref 15–41)
Albumin: 4.1 g/dL (ref 3.5–5.0)
Alkaline Phosphatase: 68 U/L (ref 38–126)
Anion gap: 9 (ref 5–15)
BUN: 30 mg/dL — ABNORMAL HIGH (ref 8–23)
CO2: 26 mmol/L (ref 22–32)
Calcium: 9.2 mg/dL (ref 8.9–10.3)
Chloride: 105 mmol/L (ref 98–111)
Creatinine, Ser: 0.72 mg/dL (ref 0.61–1.24)
GFR, Estimated: >60 mL/min (ref >60)
Glucose, Bld: 134 mg/dL — ABNORMAL HIGH (ref 70–99)
Potassium: 3.2 mmol/L — ABNORMAL LOW (ref 3.5–5.1)
Sodium: 140 mmol/L (ref 135–145)
Total Bilirubin: 1.2 mg/dL (ref 0.3–1.2)
Total Protein: 6.7 g/dL (ref 6.5–8.1)

## 2020-01-24 LAB — SAMPLE TO BLOOD BANK

## 2020-01-24 LAB — FERRITIN: Ferritin: 1399 ng/mL — ABNORMAL HIGH (ref 24–336)

## 2020-01-24 LAB — LACTATE DEHYDROGENASE: LDH: 137 U/L (ref 98–192)

## 2020-01-24 MED ORDER — EPOETIN ALFA-EPBX 40000 UNIT/ML IJ SOLN
40000.0000 [IU] | Freq: Once | INTRAMUSCULAR | Status: AC
  Administered 2020-01-24: 40000 [IU] via SUBCUTANEOUS
  Filled 2020-01-24: qty 1

## 2020-01-24 NOTE — Addendum Note (Signed)
Addended by: Delice Bison E on: 01/24/2020 03:30 PM   Modules accepted: Orders

## 2020-01-24 NOTE — Progress Notes (Signed)
[place] OFFICE PROGRESS NOTE  Patient Care Team: [name], MD as PCP - General (Family Medicine) Cammie Sickle, MD as Consulting Physician (Internal Medicine)   SUMMARY OF HEMATOLOGIC HISTORY:  # Anemia sec to -  Macrocytic ? MDS- no BMBx] on Procrit 20000 Units q ? 50M; on aranesp; [poor to tolerance/fatigue- ] FEB 2021- switch to retacrit 20 every 3 to 4 weeks,K; AUG 1st week 2021-increase Retacrit 40,000 units every 2-3 weeks  # ? Hemochromatosis- heterozygous [ferritin 800-900s] - on surveillance.   INTERVAL HISTORY:   A very pleasant 86 year-old male patient who is fairly active for his age currently on procrit for anemia secondary to likely /MDS is here for follow-up.  Patient complains of mild to moderate fatigue.  No blood in stools no black-colored stools.  No nausea no vomiting.  No new cough.  No fevers or chills.   Review of Systems  Constitutional: Positive for malaise/fatigue. Negative for chills, diaphoresis, fever and weight loss.  HENT: Negative for nosebleeds and sore throat.   Eyes: Negative for double vision.  Respiratory: Positive for cough. Negative for hemoptysis, sputum production, shortness of breath and wheezing.   Cardiovascular: Negative for chest pain, palpitations, orthopnea and leg swelling.  Gastrointestinal: Negative for abdominal pain, blood in stool, constipation, diarrhea, heartburn, melena, nausea and vomiting.  Genitourinary: Negative for dysuria, frequency and urgency.  Musculoskeletal: Positive for back pain. Negative for joint pain.  Skin: Negative.  Negative for itching and rash.  Neurological: Negative for tingling, focal weakness, weakness and headaches.  Endo/Heme/Allergies: Does not bruise/bleed easily.  Psychiatric/Behavioral: Negative for depression. The patient is not nervous/anxious and does not have insomnia.      PAST MEDICAL HISTORY :  Past Medical History:  Diagnosis Date  . Anemia   .  Arthritis   . Atrial fibrillation ([place])   . Cancer [facility])    Testicular  . Hemochromatosis 10/25/2014  . Hx of basal cell carcinoma 2008   R. ant. zygomatic/sideburn 2008, L preauricular 2019  . Hypertension   . Prostate cancer ([place])     PAST SURGICAL HISTORY :   Past Surgical History:  Procedure Laterality Date  . ANTERIOR APPROACH HEMI HIP ARTHROPLASTY Left 05/28/2019   Procedure: ANTERIOR APPROACH HEMI HIP ARTHROPLASTY;  Surgeon: Hessie Knows, MD;  Location: [facility] ORS;  Service: Orthopedics;  Laterality: Left;  . APPENDECTOMY    . CHOLECYSTECTOMY    . HAND SURGERY Bilateral   . HERNIA REPAIR    . INNER EAR SURGERY    . KYPHOPLASTY N/A 12/14/2015   Procedure: KYPHOPLASTY  L2;  Surgeon: Hessie Knows, MD;  Location: [facility] ORS;  Service: Orthopedics;  Laterality: N/A;  . KYPHOPLASTY N/A 12/23/2019   Procedure: KYPHOPLASTY;  Surgeon: Hessie Knows, MD;  Location: [facility] ORS;  Service: Orthopedics;  Laterality: N/A;  . PROSTATE SURGERY    . SURGERY SCROTAL / TESTICULAR Right     FAMILY HISTORY :   Family History  Problem Relation Age of Onset  . Hypertension Mother     SOCIAL HISTORY:   Social History   Tobacco Use  . Smoking status: Former Smoker    Packs/day: 1.00    Years: 25.00    Pack years: 25.00    Types: Cigarettes    Quit date: 03/22/1958    Years since quitting: 61.8  . Smokeless tobacco: Never Used  Vaping Use  . Vaping Use: Never used  Substance Use Topics  . Alcohol use: No  . Drug  use: No    ALLERGIES:  is allergic to levofloxacin.  MEDICATIONS:  Current Outpatient Medications  Medication Sig Dispense Refill  . acetaminophen (TYLENOL) 650 MG CR tablet Take 650 mg by mouth 2 (two) times daily as needed for pain.    [name] aspirin EC 81 MG tablet Take 81 mg by mouth every evening.     . Calcium-Magnesium-Zinc (CAL-MAG-ZINC PO) Take 1 tablet by mouth daily.    . Cholecalciferol (QC VITAMIN D3) 50 MCG (2000 UT) TABS Take by mouth.    . docusate sodium (COLACE)  100 MG capsule Take 100 mg by mouth 2 (two) times daily. With breakfast & with supper    . ELDERBERRY PO Take 2 tablets by mouth daily. Gummy    . ibuprofen (ADVIL) 200 MG tablet Take 200 mg by mouth 2 (two) times daily as needed (pain.).     [name] Lido-Capsaicin-Men-Methyl Sal (MEDI-PATCH-LIDOCAINE EX) Apply 1 patch topically daily. Applied to back    . Multiple Vitamins-Minerals (ADULT GUMMY PO) Take 2 tablets by mouth daily.    . naproxen sodium (ALEVE) 220 MG tablet Take 440 mg by mouth at bedtime.     . Potassium 99 MG TABS Take 99 mg by mouth daily.     . Turmeric 500 MG TABS Take 500 mg by mouth at bedtime. w/Ginger    . vitamin B-12 (CYANOCOBALAMIN) 1000 MCG tablet Take 1,000 mcg by mouth daily.    [name] HYDROcodone-acetaminophen (NORCO/VICODIN) 5-325 MG tablet Take 1 tablet by mouth at bedtime.  (Patient not taking: Reported on 01/24/2020)     No current facility-administered medications for this visit.    PHYSICAL EXAMINATION:   BP (!) 152/81 (BP Location: Left Arm, Patient Position: Sitting, Cuff Size: Normal)   Pulse 86   Temp (!) 97 F (36.1 C) (Tympanic)   Resp 14   Ht 5' 9" (1.753 m)   Wt 139 lb (63 kg)   SpO2 99%   BMI 20.53 kg/m   Filed Weights   01/24/20 0917  Weight: 139 lb (63 kg)    Physical Exam Constitutional:      Comments: Elderly male patient; he is in a wheelchair.  He is accompanied by his daughter.  Looks younger than his stated age.  HENT:     Head: Normocephalic and atraumatic.     Mouth/Throat:     Pharynx: No oropharyngeal exudate.  Eyes:     Pupils: Pupils are equal, round, and reactive to light.  Cardiovascular:     Rate and Rhythm: Normal rate and regular rhythm.  Pulmonary:     Effort: Pulmonary effort is normal. No respiratory distress.     Breath sounds: Normal breath sounds. No wheezing.  Abdominal:     General: Bowel sounds are normal. There is no distension.     Palpations: Abdomen is soft. There is no mass.     Tenderness: There is  no abdominal tenderness. There is no guarding or rebound.  Musculoskeletal:        General: No tenderness. Normal range of motion.     Cervical back: Normal range of motion and neck supple.  Skin:    General: Skin is warm.  Neurological:     Mental Status: He is alert and oriented to person, place, and time.  Psychiatric:        Mood and Affect: Affect normal.      LABORATORY DATA:  I have reviewed the data as listed    Component Value Date/Time  NA 140 01/24/2020 0903   NA 142 05/06/2013 0158   K 3.2 (L) 01/24/2020 0903   K 3.7 05/06/2013 0158   CL 105 01/24/2020 0903   CL 112 (H) 05/06/2013 0158   CO2 26 01/24/2020 0903   CO2 24 05/06/2013 0158   GLUCOSE 134 (H) 01/24/2020 0903   GLUCOSE 95 05/06/2013 0158   BUN 30 (H) 01/24/2020 0903   BUN 32 (H) 05/06/2013 0158   CREATININE 0.72 01/24/2020 0903   CREATININE 1.22 11/25/2013 1332   CALCIUM 9.2 01/24/2020 0903   CALCIUM 9.1 05/06/2013 0158   PROT 6.7 01/24/2020 0903   PROT 6.1 (L) 05/06/2013 0158   ALBUMIN 4.1 01/24/2020 0903   ALBUMIN 3.5 05/06/2013 0158   AST 17 01/24/2020 0903   AST 22 05/06/2013 0158   ALT 14 01/24/2020 0903   ALT 18 05/06/2013 0158   ALKPHOS 68 01/24/2020 0903   ALKPHOS 60 05/06/2013 0158   BILITOT 1.2 01/24/2020 0903   BILITOT 1.4 (H) 05/06/2013 0158   GFRNONAA >60 01/24/2020 0903   GFRNONAA 52 (L) 11/25/2013 1332   GFRAA >60 12/13/2019 0847   GFRAA >60 11/25/2013 1332    No results found for: SPEP, UPEP  Lab Results  Component Value Date   WBC 7.6 01/24/2020   NEUTROABS 4.6 01/24/2020   HGB 9.3 (L) 01/24/2020   HCT 27.6 (L) 01/24/2020   MCV 94.5 01/24/2020   PLT 458 (H) 01/24/2020      Chemistry      Component Value Date/Time   NA 140 01/24/2020 0903   NA 142 05/06/2013 0158   K 3.2 (L) 01/24/2020 0903   K 3.7 05/06/2013 0158   CL 105 01/24/2020 0903   CL 112 (H) 05/06/2013 0158   CO2 26 01/24/2020 0903   CO2 24 05/06/2013 0158   BUN 30 (H) 01/24/2020 0903   BUN 32  (H) 05/06/2013 0158   CREATININE 0.72 01/24/2020 0903   CREATININE 1.22 11/25/2013 1332      Component Value Date/Time   CALCIUM 9.2 01/24/2020 0903   CALCIUM 9.1 05/06/2013 0158   ALKPHOS 68 01/24/2020 0903   ALKPHOS 60 05/06/2013 0158   AST 17 01/24/2020 0903   AST 22 05/06/2013 0158   ALT 14 01/24/2020 0903   ALT 18 05/06/2013 0158   BILITOT 1.2 01/24/2020 0903   BILITOT 1.4 (H) 05/06/2013 0158       RADIOGRAPHIC STUDIES: I have personally reviewed the radiological images as listed and agreed with the findings in the report. No results found.   ASSESSMENT & PLAN:   MDS (myelodysplastic syndrome), low grade (HCC) #  Anemia ?  Likely secondary to MDS. [no previous BMBx]- Declines Bone marrow Biopsy. Hb- 9.3.  Proceed with Retacrit 40,K q 2 W.   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 STABLE; May 2021- Ferritin- 1200. Awaiting irons studies today.   #I spoke to patient's daughter  regarding above plan.  She is in agreement.   # # I discussed regarding Covid-19 precautions.  I reviewed the vaccine effectiveness and potential side effects in detail. Pt is unvaccinated to [place]; recommend vaccination. Checking with [facility] facilities.   # DISPOSITION:  # Retacrit today; NO transfusion today.   # in 3 weeks; H&H; Hold tube; posisble Retacrit  # in 6 weeks; H&H; Hold tube; posisble Retacrit  # in 9 weeks- MD;  Cbc/cmp;LDH; Hold tube; posisble Retacrit- Dr.B     Cammie Sickle, MD 01/24/2020 10:15 AM

## 2020-01-24 NOTE — Assessment & Plan Note (Addendum)
#  Anemia ?  Likely secondary to MDS. [no previous BMBx]- Declines Bone marrow Biopsy. Hb- 9.3.  Proceed with Retacrit 40,K q 2 W.   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 STABLE; May 2021- Ferritin- 1200. Awaiting irons studies today.   #I spoke to patient's daughter  regarding above plan.  She is in agreement.   # # I discussed regarding Covid-19 precautions.  I reviewed the vaccine effectiveness and potential side effects in detail. Pt is unvaccinated to [place]; recommend vaccination. Checking with [facility] facilities.   # DISPOSITION:  # Retacrit today; NO transfusion today.   # in 3 weeks; H&H; Hold tube; posisble Retacrit  # in 6 weeks; H&H; Hold tube; posisble Retacrit  # in 9 weeks- MD;  Cbc/cmp;LDH; Hold tube; posisble Retacrit- Dr.B

## 2020-02-14 ENCOUNTER — Inpatient Hospital Stay: Payer: Medicare Other

## 2020-02-14 VITALS — BP 166/85 | HR 85

## 2020-02-14 DIAGNOSIS — D4620 Refractory anemia with excess of blasts, unspecified: Secondary | ICD-10-CM

## 2020-02-14 DIAGNOSIS — D469 Myelodysplastic syndrome, unspecified: Secondary | ICD-10-CM | POA: Diagnosis not present

## 2020-02-14 DIAGNOSIS — D649 Anemia, unspecified: Secondary | ICD-10-CM

## 2020-02-14 LAB — HEMOGLOBIN AND HEMATOCRIT, BLOOD
HCT: 26.2 % — ABNORMAL LOW (ref 39.0–52.0)
Hemoglobin: 8.8 g/dL — ABNORMAL LOW (ref 13.0–17.0)

## 2020-02-14 LAB — SAMPLE TO BLOOD BANK

## 2020-02-14 MED ORDER — EPOETIN ALFA-EPBX 40000 UNIT/ML IJ SOLN
40000.0000 [IU] | Freq: Once | INTRAMUSCULAR | Status: AC
  Administered 2020-02-14: 40000 [IU] via SUBCUTANEOUS
  Filled 2020-02-14: qty 1

## 2020-03-02 ENCOUNTER — Other Ambulatory Visit: Payer: Medicare Other | Admitting: Nurse Practitioner

## 2020-03-06 ENCOUNTER — Inpatient Hospital Stay: Payer: Medicare Other

## 2020-03-09 ENCOUNTER — Other Ambulatory Visit: Payer: Self-pay

## 2020-03-09 ENCOUNTER — Other Ambulatory Visit: Payer: Medicare Other | Admitting: Nurse Practitioner

## 2020-03-09 ENCOUNTER — Encounter: Payer: Self-pay | Admitting: Nurse Practitioner

## 2020-03-09 DIAGNOSIS — D469 Myelodysplastic syndrome, unspecified: Secondary | ICD-10-CM

## 2020-03-09 DIAGNOSIS — Z515 Encounter for palliative care: Secondary | ICD-10-CM

## 2020-03-09 NOTE — Progress Notes (Signed)
[facility] Consult Note Telephone: [phone]  Fax: [phone]  PATIENT NAME: Gerald [name] DOB: [id number] MRN: [id number]  PRIMARY CARE PROVIDER:   [name], MD  REFERRING PROVIDER:  [name], MD [address] [facility] [place],  [place] [address]  Due to the COVID-19 crisis, this visit was done via telemedicine from my office and it was initiated and consent by this patient and or family.  RESPONSIBLE PARTY:Gerald [name] daughter [id number]  complex medical decision making. 1. Advance Care Planning; full code  2. Goals of Care: Goals include to maximize quality of life and symptom management. Our advance care planning conversation included a discussion about:     The value and importance of advance care planning   Exploration of personal, cultural or spiritual beliefs that might influence medical decisions   Exploration of goals of care in the event of a sudden injury or illness   Identification and preparation of a healthcare agent   Review and updating or creation of an  advance directive document.  3. Palliative care encounter; Palliative care encounter; Palliative medicine team will continue to support patient, patient's family, and medical team. Visit consisted of counseling and education dealing with the complex and emotionally intense issues of symptom management and palliative care in the setting of serious and potentially life-threatening illness  4. f/u tomorrow  to see how Gerald [name], [name] endorses will contact, see if hospitalized will f/u after  I spent 50 minutes providing this consultation,  from 2:00pm to 2:50pm. More than 50% of the time in this consultation was spent coordinating communication.   HISTORY OF PRESENT ILLNESS:  Gerald [name] is a 86 y.o. year old male with multiple medical problems including Myelodysplastic syndrome, testicular cancer s/p sgy,  prostate cancer, history basal cell carcinoma, hemochromatosis, hypertension, anemia, appendectomy, cholecystectomy, bilateral hands surgery, kyphoplasty.I called Gerald [name], Mr. Fearrington daughter to confirm Palliative care follow-up visit today. Gerald [name], Mr. Friedel daughter endorses Mr. Tripplett has a cold as well as the rest of the family. Gerald [name] endorses one family member tested negative for covid. We talked about concern for covid symptoms will change to telemedicine telephonic is video not available. We talked about how Mr. Wackerman has been feeling. Gerald [name]  were present. Gerald [name] endorses is Mr. Berriman been sick for several days with cold symptoms same as the rest of the family. Gerald [name] endorses it has been very hard to get him up to toilet him as Mr. Korb is very weak. Gerald [name] endorses they call Dr. Kary Kos office, was advised to take him to the emergency department. We talked about symptoms which include cough, cold, congestion, wheezing. Gerald [name] endorses they gave him NyQuil last night to help him rest and needed sleep. We talked about no fever, sometimes elderly folks do not run a fever with pna, sepsis. We talked about his appetite. Gerald [name] endorses Mr. Potosky actually ate fairly good today. Mr. Palk has been sleeping a lot. We talked about concern for past medical history, current acute symptoms and age. We talked at length about medical goals of care with wishes being aggressive. We talked about concern for waiting too long before he goes to the emergency department as a will be more severe his condition and more difficult to treat. We talked about option of urgent care. Discuss that Urgent Care will likely send him to the emergency department as if he is as weak as what they say. We talked about  at present time Mr. Moch is sitting in his recliner. He did open his eyes and was a little more alert today. We talked about the option of having EMS come  out for the possibility of transport for evaluation. Gerald [name] endorses that they will need to further discuss among family members next step. Talked at length about concern for critical condition, sepsis, pna, need to go to the emergency department. Gerald [name] endorses she and her sisters were thankful for Palliative care phone call in discussion. Gerald [name] endorses they will take advise under advisement and they will continue to monitor at home for now. If things change they will have him go to the emergency department. Gerald [name] endorses they are using a humidifier, encouraging them to eat, trying to push fluids. We talked about concern for need for antibiotics as with what sounds like he possibly has pneumonia and possibly even covid. We talked about interventions that can be done at the hospital that can help improve his symptoms. We talked at length about waiting too long to get him to ED for interventions may result in a grim outcome. Gerald [name] endorses understanding. Gerald [name] endorses again that they will further discuss among family and continue to monitor Mr. Mcenroe at home if something changes they will take him to the emergency department. Therapeutic listening emotional support provided. Contact information provided. Questions answered to satisfaction. Will follow up tomorrow if needed to see if decided to bring to hospital as recommended  Palliative Care was asked to help to continue to address goals of care.   CODE STATUS: Full code  PPS: 30% HOSPICE ELIGIBILITY/DIAGNOSIS: TBD  PAST MEDICAL HISTORY:  Past Medical History:  Diagnosis Date  . Anemia   . Arthritis   . Atrial fibrillation ([place])   . Cancer [facility])    Testicular  . Hemochromatosis 10/25/2014  . Hx of basal cell carcinoma 2008   R. ant. zygomatic/sideburn 2008, L preauricular 2019  . Hypertension   . Prostate cancer [facility])     SOCIAL HX:  Social History   Tobacco Use  . Smoking status: Former Smoker    Packs/day: 1.00     Years: 25.00    Pack years: 25.00    Types: Cigarettes    Quit date: 03/22/1958    Years since quitting: 62.0  . Smokeless tobacco: Never Used  Substance Use Topics  . Alcohol use: No    ALLERGIES:  Allergies  Allergen Reactions  . Levofloxacin Other (See Comments)    Taste alterations for about 1 month after taking med     PERTINENT MEDICATIONS:  Outpatient Encounter Medications as of 03/09/2020  Medication Sig  . acetaminophen (TYLENOL) 650 MG CR tablet Take 650 mg by mouth 2 (two) times daily as needed for pain.  [name] aspirin EC 81 MG tablet Take 81 mg by mouth every evening.   . Calcium-Magnesium-Zinc (CAL-MAG-ZINC PO) Take 1 tablet by mouth daily.  . Cholecalciferol (QC VITAMIN D3) 50 MCG (2000 UT) TABS Take by mouth.  . docusate sodium (COLACE) 100 MG capsule Take 100 mg by mouth 2 (two) times daily. With breakfast & with supper  . ELDERBERRY PO Take 2 tablets by mouth daily. Gummy  . HYDROcodone-acetaminophen (NORCO/VICODIN) 5-325 MG tablet Take 1 tablet by mouth at bedtime.  (Patient not taking: Reported on 01/24/2020)  . ibuprofen (ADVIL) 200 MG tablet Take 200 mg by mouth 2 (two) times daily as needed (pain.).   [name] Lido-Capsaicin-Men-Methyl Sal (MEDI-PATCH-LIDOCAINE EX) Apply 1 patch topically daily. Applied to back  .  Multiple Vitamins-Minerals (ADULT GUMMY PO) Take 2 tablets by mouth daily.  . naproxen sodium (ALEVE) 220 MG tablet Take 440 mg by mouth at bedtime.   . Potassium 99 MG TABS Take 99 mg by mouth daily.   . Turmeric 500 MG TABS Take 500 mg by mouth at bedtime. w/Ginger  . vitamin B-12 (CYANOCOBALAMIN) 1000 MCG tablet Take 1,000 mcg by mouth daily.   No facility-administered encounter medications on file as of 03/09/2020.    PHYSICAL EXAM:   deferred  Nayelli Inglis Ihor Gully, NP

## 2020-03-13 ENCOUNTER — Inpatient Hospital Stay: Payer: Medicare Other

## 2020-03-14 ENCOUNTER — Telehealth: Payer: Self-pay

## 2020-03-14 NOTE — Telephone Encounter (Signed)
Received message that daughter had called on 03/13/20 to share that they had called EMS but did not want to transport patient to ED. Would like follow up call on 03/14/20. Phone call placed to daughter who shared that PCP was contacted and ordered patient a Z pack and directed patient to take mucinex twice a day. Will update Palliative NP

## 2020-03-22 ENCOUNTER — Telehealth: Payer: Self-pay | Admitting: Family Medicine

## 2020-03-22 NOTE — Telephone Encounter (Signed)
   Gerald [name] DOB: Nov 27, 1920 MRN: [id number]   RIDER WAIVER AND RELEASE OF LIABILITY  For purposes of improving physical access to our facilities, [facility] is pleased to partner with third parties to provide [facility] patients or other authorized individuals the option of convenient, on-demand ground transportation services (the Chiropractor") through use of the technology service that enables users to request on-demand ground transportation from independent third-party providers.  By opting to use and accept these [employer], I, the undersigned, hereby agree on behalf of myself, and on behalf of any minor child using the [employer] for whom I am the parent or legal guardian, as follows:  1. Science writer provided to me are provided by independent third-party transportation providers who are not [employer] or employees and who are unaffiliated with [employer]. 2. [facility] is neither a transportation carrier nor a common or public carrier. 3. [facility] has no control over the quality or safety of the transportation that occurs as a result of the [employer]. 4. [facility] cannot guarantee that any third-party transportation provider will complete any arranged transportation service. 5. [facility] makes no representation, warranty, or guarantee regarding the reliability, timeliness, quality, safety, suitability, or availability of any of the Transport Services or that they will be error free. 6. I fully understand that traveling by vehicle involves risks and dangers of serious bodily injury, including permanent disability, paralysis, and death. I agree, on behalf of myself and on behalf of any minor child using the Transport Services for whom I am the parent or legal guardian, that the entire risk arising out of my use of the [employer] remains solely with me, to the maximum extent permitted under applicable law. 7. The [employer] are provided "as is" and "as available." [facility] disclaims all representations and warranties, express, implied or statutory, not expressly set out in these terms, including the implied warranties of merchantability and fitness for a particular purpose. 8. I hereby waive and release [facility], its agents, employees, officers, directors, representatives, insurers, attorneys, assigns, successors, subsidiaries, and affiliates from any and all past, present, or future claims, demands, liabilities, actions, causes of action, or suits of any kind directly or indirectly arising from acceptance and use of the [employer]. 9. I further waive and release  and its affiliates from all present and future liability and responsibility for any injury or death to persons or damages to property caused by or related to the use of the [employer]. 10. I have read this Waiver and Release of Liability, and I understand the terms used in it and their legal significance. This Waiver is freely and voluntarily given with the understanding that my right (as well as the right of any minor child for whom I am the parent or legal guardian using the [employer]) to legal recourse against  in connection with the [employer] is knowingly surrendered in return for use of these services.   I attest that I read the consent document to Gerald [name], gave Gerald [name] the opportunity to ask questions and answered the questions asked (if any). I affirm that Gerald [name] then provided consent for he's participation in this program.     Gerald [name]  Spoke with Daughter Gerald [name]

## 2020-03-27 ENCOUNTER — Inpatient Hospital Stay: Payer: Medicare Other

## 2020-03-27 ENCOUNTER — Other Ambulatory Visit: Payer: Self-pay

## 2020-03-27 ENCOUNTER — Telehealth: Payer: Self-pay | Admitting: Internal Medicine

## 2020-03-27 ENCOUNTER — Encounter: Payer: Self-pay | Admitting: Emergency Medicine

## 2020-03-27 ENCOUNTER — Emergency Department: Payer: Medicare Other

## 2020-03-27 ENCOUNTER — Inpatient Hospital Stay: Payer: Medicare Other | Admitting: Internal Medicine

## 2020-03-27 ENCOUNTER — Telehealth: Payer: Self-pay | Admitting: *Deleted

## 2020-03-27 DIAGNOSIS — Z87891 Personal history of nicotine dependence: Secondary | ICD-10-CM

## 2020-03-27 DIAGNOSIS — L89152 Pressure ulcer of sacral region, stage 2: Secondary | ICD-10-CM | POA: Diagnosis present

## 2020-03-27 DIAGNOSIS — D638 Anemia in other chronic diseases classified elsewhere: Secondary | ICD-10-CM | POA: Diagnosis present

## 2020-03-27 DIAGNOSIS — K6289 Other specified diseases of anus and rectum: Secondary | ICD-10-CM | POA: Diagnosis not present

## 2020-03-27 DIAGNOSIS — R7989 Other specified abnormal findings of blood chemistry: Secondary | ICD-10-CM | POA: Diagnosis present

## 2020-03-27 DIAGNOSIS — Z20822 Contact with and (suspected) exposure to covid-19: Secondary | ICD-10-CM | POA: Diagnosis present

## 2020-03-27 DIAGNOSIS — Z79899 Other long term (current) drug therapy: Secondary | ICD-10-CM

## 2020-03-27 DIAGNOSIS — G9349 Other encephalopathy: Secondary | ICD-10-CM | POA: Diagnosis present

## 2020-03-27 DIAGNOSIS — Z85828 Personal history of other malignant neoplasm of skin: Secondary | ICD-10-CM

## 2020-03-27 DIAGNOSIS — I6381 Other cerebral infarction due to occlusion or stenosis of small artery: Secondary | ICD-10-CM | POA: Diagnosis not present

## 2020-03-27 DIAGNOSIS — Z8546 Personal history of malignant neoplasm of prostate: Secondary | ICD-10-CM

## 2020-03-27 DIAGNOSIS — I340 Nonrheumatic mitral (valve) insufficiency: Secondary | ICD-10-CM | POA: Diagnosis present

## 2020-03-27 DIAGNOSIS — N281 Cyst of kidney, acquired: Secondary | ICD-10-CM | POA: Diagnosis present

## 2020-03-27 DIAGNOSIS — R4182 Altered mental status, unspecified: Secondary | ICD-10-CM

## 2020-03-27 DIAGNOSIS — D735 Infarction of spleen: Secondary | ICD-10-CM | POA: Diagnosis present

## 2020-03-27 DIAGNOSIS — Z9181 History of falling: Secondary | ICD-10-CM

## 2020-03-27 DIAGNOSIS — Z8547 Personal history of malignant neoplasm of testis: Secondary | ICD-10-CM

## 2020-03-27 DIAGNOSIS — I493 Ventricular premature depolarization: Secondary | ICD-10-CM | POA: Diagnosis present

## 2020-03-27 DIAGNOSIS — K5900 Constipation, unspecified: Secondary | ICD-10-CM | POA: Diagnosis present

## 2020-03-27 DIAGNOSIS — I480 Paroxysmal atrial fibrillation: Secondary | ICD-10-CM | POA: Diagnosis present

## 2020-03-27 DIAGNOSIS — I10 Essential (primary) hypertension: Secondary | ICD-10-CM | POA: Diagnosis present

## 2020-03-27 DIAGNOSIS — I712 Thoracic aortic aneurysm, without rupture: Secondary | ICD-10-CM | POA: Diagnosis present

## 2020-03-27 DIAGNOSIS — D469 Myelodysplastic syndrome, unspecified: Secondary | ICD-10-CM | POA: Diagnosis present

## 2020-03-27 DIAGNOSIS — Z8249 Family history of ischemic heart disease and other diseases of the circulatory system: Secondary | ICD-10-CM

## 2020-03-27 DIAGNOSIS — Z96642 Presence of left artificial hip joint: Secondary | ICD-10-CM | POA: Diagnosis present

## 2020-03-27 DIAGNOSIS — E876 Hypokalemia: Secondary | ICD-10-CM | POA: Diagnosis present

## 2020-03-27 DIAGNOSIS — N289 Disorder of kidney and ureter, unspecified: Secondary | ICD-10-CM | POA: Diagnosis present

## 2020-03-27 LAB — CBC WITH DIFFERENTIAL/PLATELET
Abs Immature Granulocytes: 0.04 10*3/uL (ref 0.00–0.07)
Basophils Absolute: 0.1 10*3/uL (ref 0.0–0.1)
Basophils Relative: 1 %
Eosinophils Absolute: 0.3 10*3/uL (ref 0.0–0.5)
Eosinophils Relative: 3 %
HCT: 32 % — ABNORMAL LOW (ref 39.0–52.0)
Hemoglobin: 10.3 g/dL — ABNORMAL LOW (ref 13.0–17.0)
Immature Granulocytes: 0 %
Lymphocytes Relative: 12 %
Lymphs Abs: 1.3 10*3/uL (ref 0.7–4.0)
MCH: 30.5 pg (ref 26.0–34.0)
MCHC: 32.2 g/dL (ref 30.0–36.0)
MCV: 94.7 fL (ref 80.0–100.0)
Monocytes Absolute: 1.6 10*3/uL — ABNORMAL HIGH (ref 0.1–1.0)
Monocytes Relative: 15 %
Neutro Abs: 7.6 10*3/uL (ref 1.7–7.7)
Neutrophils Relative %: 69 %
Platelets: 444 10*3/uL — ABNORMAL HIGH (ref 150–400)
RBC: 3.38 MIL/uL — ABNORMAL LOW (ref 4.22–5.81)
RDW: 21.9 % — ABNORMAL HIGH (ref 11.5–15.5)
Smear Review: NORMAL
WBC: 11 10*3/uL — ABNORMAL HIGH (ref 4.0–10.5)
nRBC: 0.3 % — ABNORMAL HIGH (ref 0.0–0.2)

## 2020-03-27 LAB — COMPREHENSIVE METABOLIC PANEL
ALT: 13 U/L (ref 0–44)
AST: 18 U/L (ref 15–41)
Albumin: 3.7 g/dL (ref 3.5–5.0)
Alkaline Phosphatase: 61 U/L (ref 38–126)
Anion gap: 11 (ref 5–15)
BUN: 22 mg/dL (ref 8–23)
CO2: 26 mmol/L (ref 22–32)
Calcium: 9.8 mg/dL (ref 8.9–10.3)
Chloride: 103 mmol/L (ref 98–111)
Creatinine, Ser: 0.84 mg/dL (ref 0.61–1.24)
GFR, Estimated: >60 mL/min (ref >60)
Glucose, Bld: 130 mg/dL — ABNORMAL HIGH (ref 70–99)
Potassium: 3.4 mmol/L — ABNORMAL LOW (ref 3.5–5.1)
Sodium: 140 mmol/L (ref 135–145)
Total Bilirubin: 1.1 mg/dL (ref 0.3–1.2)
Total Protein: 7 g/dL (ref 6.5–8.1)

## 2020-03-27 LAB — TROPONIN I (HIGH SENSITIVITY)
Troponin I (High Sensitivity): 18 ng/L — ABNORMAL HIGH (ref <18)
Troponin I (High Sensitivity): 18 ng/L — ABNORMAL HIGH (ref <18)

## 2020-03-27 LAB — TYPE AND SCREEN
ABO/RH(D): O POS
Antibody Screen: NEGATIVE

## 2020-03-27 NOTE — ED Triage Notes (Signed)
Pt to ED from [facility]. Pt's daughter reports pt is normally seen at cancer center due to blood disorder that causes low hemoglobin. Pt's daughter reports that patient was sent from cancer center for possible transfusion and increasing lethargy since Friday.

## 2020-03-27 NOTE — Telephone Encounter (Signed)
Patient arrived to cancer center at 155 pm. I was paged by the phlebotomy team in cancer center to evaluate the patient. Patient slumped over in w/c. Unable to hold himself up in chair. Patient lethargic. Vitals taken 165/76; HR 94. Oxygen sats at 92% Room Air; Respiration 22  Patient unable to stand per daughter due to weakness. Patient has not been as verbal since last Friday. Per daughter, patient has become progressively weak since Saturday. Approximately 3 weeks ago, patient developed cold-like symptoms. Patient's family has cnl apts in the clinic for lab checks due to patient's ongoing weakness. Patient has h/o MDS and h/o worsening anemia. Last lab check in our clinic 1 month ago. hgb 8.8 at that visit.  Dr. Rogue Bussing notified and MD evaluated patient. MD asked that RN send patient to ER for evaluation.

## 2020-03-27 NOTE — Telephone Encounter (Signed)
Patient on schedule for 1/10-for anemia/l MD ;labs Retacrit.  However as per the daughter patient feeling weak; slouched in the chair; vital signs stable pulse ox 90-95; heart rate 90s; blood pressure 818H systolic.  Discussed with the daughter differential severe anemia versus other causes like UTI.  Needs further work-up/possible blood transfusion if hemoglobin is less than 8.  Recommend evaluation emergency room.  Daughter in agreement.

## 2020-03-27 NOTE — ED Notes (Signed)
Pt incontinent of urine, depends changed and peri care done.  

## 2020-03-27 NOTE — ED Triage Notes (Signed)
First Nurse Note:  Arrives with daughter from PCP office, c/o Lethargy since Friday.  Daughter states she believes he hasn't been talking as much since Friday due to it taking too much effort.  Patient is awake and responsive.  MAE.

## 2020-03-28 ENCOUNTER — Emergency Department: Payer: Medicare Other

## 2020-03-28 ENCOUNTER — Inpatient Hospital Stay
Admit: 2020-03-28 | Discharge: 2020-03-28 | Disposition: A | Discharge status: Home / Self Care | Payer: Medicare Other | Attending: Obstetrics and Gynecology | Admitting: Obstetrics and Gynecology

## 2020-03-28 ENCOUNTER — Other Ambulatory Visit: Payer: Self-pay | Admitting: Radiology

## 2020-03-28 ENCOUNTER — Inpatient Hospital Stay: Admit: 2020-03-28 | Payer: Medicare Other

## 2020-03-28 ENCOUNTER — Inpatient Hospital Stay: Payer: Medicare Other

## 2020-03-28 ENCOUNTER — Inpatient Hospital Stay
Admission: EM | Admit: 2020-03-28 | Discharge: 2020-04-04 | DRG: 065 | Disposition: A | Discharge status: Home Health | Payer: Medicare Other | Attending: Family Medicine | Admitting: Family Medicine

## 2020-03-28 DIAGNOSIS — D638 Anemia in other chronic diseases classified elsewhere: Secondary | ICD-10-CM | POA: Diagnosis present

## 2020-03-28 DIAGNOSIS — K6289 Other specified diseases of anus and rectum: Secondary | ICD-10-CM | POA: Diagnosis present

## 2020-03-28 DIAGNOSIS — D469 Myelodysplastic syndrome, unspecified: Secondary | ICD-10-CM | POA: Diagnosis present

## 2020-03-28 DIAGNOSIS — Z96642 Presence of left artificial hip joint: Secondary | ICD-10-CM | POA: Diagnosis present

## 2020-03-28 DIAGNOSIS — I639 Cerebral infarction, unspecified: Secondary | ICD-10-CM

## 2020-03-28 DIAGNOSIS — Z20822 Contact with and (suspected) exposure to covid-19: Secondary | ICD-10-CM | POA: Diagnosis present

## 2020-03-28 DIAGNOSIS — Z515 Encounter for palliative care: Secondary | ICD-10-CM

## 2020-03-28 DIAGNOSIS — G9340 Encephalopathy, unspecified: Secondary | ICD-10-CM | POA: Diagnosis not present

## 2020-03-28 DIAGNOSIS — Z87891 Personal history of nicotine dependence: Secondary | ICD-10-CM | POA: Diagnosis not present

## 2020-03-28 DIAGNOSIS — D735 Infarction of spleen: Secondary | ICD-10-CM | POA: Diagnosis present

## 2020-03-28 DIAGNOSIS — R531 Weakness: Secondary | ICD-10-CM | POA: Diagnosis not present

## 2020-03-28 DIAGNOSIS — Z85828 Personal history of other malignant neoplasm of skin: Secondary | ICD-10-CM | POA: Diagnosis not present

## 2020-03-28 DIAGNOSIS — K5900 Constipation, unspecified: Secondary | ICD-10-CM | POA: Diagnosis present

## 2020-03-28 DIAGNOSIS — E876 Hypokalemia: Secondary | ICD-10-CM | POA: Diagnosis present

## 2020-03-28 DIAGNOSIS — I480 Paroxysmal atrial fibrillation: Secondary | ICD-10-CM | POA: Diagnosis present

## 2020-03-28 DIAGNOSIS — Z8547 Personal history of malignant neoplasm of testis: Secondary | ICD-10-CM | POA: Diagnosis not present

## 2020-03-28 DIAGNOSIS — N289 Disorder of kidney and ureter, unspecified: Secondary | ICD-10-CM

## 2020-03-28 DIAGNOSIS — L89152 Pressure ulcer of sacral region, stage 2: Secondary | ICD-10-CM

## 2020-03-28 DIAGNOSIS — Z79899 Other long term (current) drug therapy: Secondary | ICD-10-CM | POA: Diagnosis not present

## 2020-03-28 DIAGNOSIS — I340 Nonrheumatic mitral (valve) insufficiency: Secondary | ICD-10-CM | POA: Diagnosis present

## 2020-03-28 DIAGNOSIS — D46Z Other myelodysplastic syndromes: Secondary | ICD-10-CM | POA: Diagnosis present

## 2020-03-28 DIAGNOSIS — I10 Essential (primary) hypertension: Secondary | ICD-10-CM | POA: Diagnosis present

## 2020-03-28 DIAGNOSIS — R627 Adult failure to thrive: Secondary | ICD-10-CM

## 2020-03-28 DIAGNOSIS — I493 Ventricular premature depolarization: Secondary | ICD-10-CM | POA: Diagnosis present

## 2020-03-28 DIAGNOSIS — I712 Thoracic aortic aneurysm, without rupture, unspecified: Secondary | ICD-10-CM

## 2020-03-28 DIAGNOSIS — D4620 Refractory anemia with excess of blasts, unspecified: Secondary | ICD-10-CM | POA: Diagnosis present

## 2020-03-28 DIAGNOSIS — G9349 Other encephalopathy: Secondary | ICD-10-CM | POA: Diagnosis present

## 2020-03-28 DIAGNOSIS — I6381 Other cerebral infarction due to occlusion or stenosis of small artery: Secondary | ICD-10-CM | POA: Diagnosis present

## 2020-03-28 DIAGNOSIS — D649 Anemia, unspecified: Secondary | ICD-10-CM | POA: Diagnosis present

## 2020-03-28 DIAGNOSIS — Z8546 Personal history of malignant neoplasm of prostate: Secondary | ICD-10-CM | POA: Diagnosis not present

## 2020-03-28 DIAGNOSIS — R4182 Altered mental status, unspecified: Secondary | ICD-10-CM

## 2020-03-28 DIAGNOSIS — I4891 Unspecified atrial fibrillation: Secondary | ICD-10-CM | POA: Diagnosis present

## 2020-03-28 DIAGNOSIS — Z9181 History of falling: Secondary | ICD-10-CM | POA: Diagnosis not present

## 2020-03-28 DIAGNOSIS — I509 Heart failure, unspecified: Secondary | ICD-10-CM

## 2020-03-28 LAB — URINALYSIS, COMPLETE (UACMP) WITH MICROSCOPIC
Bacteria, UA: NONE SEEN
Bilirubin Urine: NEGATIVE
Glucose, UA: NEGATIVE mg/dL
Hgb urine dipstick: NEGATIVE
Ketones, ur: NEGATIVE mg/dL
Leukocytes,Ua: NEGATIVE
Nitrite: NEGATIVE
Protein, ur: NEGATIVE mg/dL
Specific Gravity, Urine: 1.012 (ref 1.005–1.030)
pH: 6 (ref 5.0–8.0)

## 2020-03-28 LAB — PROCALCITONIN: Procalcitonin: <0.1 ng/mL

## 2020-03-28 LAB — ECHOCARDIOGRAM COMPLETE
AR max vel: 2.7 cm2
AV Area VTI: 3.96 cm2
AV Area mean vel: 3.02 cm2
AV Mean grad: 2 mmHg
AV Peak grad: 3.1 mmHg
Ao pk vel: 0.88 m/s
Area-P 1/2: 3.84 cm2
S' Lateral: 2.8 cm

## 2020-03-28 LAB — RESP PANEL BY RT-PCR (FLU A&B, COVID) ARPGX2
Influenza A by PCR: NEGATIVE
Influenza B by PCR: NEGATIVE
SARS Coronavirus 2 by RT PCR: NEGATIVE

## 2020-03-28 LAB — LACTIC ACID, PLASMA: Lactic Acid, Venous: 1.5 mmol/L (ref 0.5–1.9)

## 2020-03-28 LAB — SAVE SMEAR(SSMR), FOR PROVIDER SLIDE REVIEW

## 2020-03-28 LAB — BRAIN NATRIURETIC PEPTIDE: B Natriuretic Peptide: 274.1 pg/mL — ABNORMAL HIGH (ref 0.0–100.0)

## 2020-03-28 LAB — TROPONIN I (HIGH SENSITIVITY)
Troponin I (High Sensitivity): 24 ng/L — ABNORMAL HIGH (ref <18)
Troponin I (High Sensitivity): 27 ng/L — ABNORMAL HIGH (ref <18)

## 2020-03-28 LAB — PATHOLOGIST SMEAR REVIEW

## 2020-03-28 LAB — MAGNESIUM: Magnesium: 1.9 mg/dL (ref 1.7–2.4)

## 2020-03-28 MED ORDER — ACETAMINOPHEN 325 MG PO TABS
650.0000 mg | ORAL_TABLET | Freq: Four times a day (QID) | ORAL | Status: DC | PRN
  Administered 2020-03-28 – 2020-04-04 (×16): 650 mg via ORAL
  Filled 2020-03-28 (×16): qty 2

## 2020-03-28 MED ORDER — LACTATED RINGERS IV BOLUS
1000.0000 mL | Freq: Once | INTRAVENOUS | Status: AC
  Administered 2020-03-28: 1000 mL via INTRAVENOUS

## 2020-03-28 MED ORDER — LORAZEPAM 2 MG/ML IJ SOLN
0.5000 mg | Freq: Once | INTRAMUSCULAR | Status: DC
  Filled 2020-03-28: qty 1

## 2020-03-28 MED ORDER — PIPERACILLIN-TAZOBACTAM 3.375 G IVPB 30 MIN
3.3750 g | Freq: Once | INTRAVENOUS | Status: AC
  Administered 2020-03-28: 3.375 g via INTRAVENOUS
  Filled 2020-03-28: qty 50

## 2020-03-28 MED ORDER — ENOXAPARIN SODIUM 40 MG/0.4ML ~~LOC~~ SOLN
40.0000 mg | SUBCUTANEOUS | Status: DC
  Administered 2020-03-28 – 2020-04-04 (×8): 40 mg via SUBCUTANEOUS
  Filled 2020-03-28 (×8): qty 0.4

## 2020-03-28 MED ORDER — PIPERACILLIN-TAZOBACTAM 3.375 G IVPB
3.3750 g | Freq: Three times a day (TID) | INTRAVENOUS | Status: DC
  Administered 2020-03-28 – 2020-03-31 (×9): 3.375 g via INTRAVENOUS
  Filled 2020-03-28 (×9): qty 50

## 2020-03-28 MED ORDER — IOHEXOL 350 MG/ML SOLN
100.0000 mL | Freq: Once | INTRAVENOUS | Status: AC | PRN
  Administered 2020-03-28: 100 mL via INTRAVENOUS

## 2020-03-28 MED ORDER — DOCUSATE SODIUM 100 MG PO CAPS
100.0000 mg | ORAL_CAPSULE | Freq: Two times a day (BID) | ORAL | Status: DC
  Administered 2020-03-29 – 2020-04-03 (×2): 100 mg via ORAL
  Filled 2020-03-28 (×6): qty 1

## 2020-03-28 MED ORDER — FUROSEMIDE 10 MG/ML IJ SOLN
20.0000 mg | Freq: Once | INTRAMUSCULAR | Status: AC
  Administered 2020-03-28: 20 mg via INTRAVENOUS
  Filled 2020-03-28: qty 4

## 2020-03-28 MED ORDER — ASPIRIN 300 MG RE SUPP: 300.0000 mg | Freq: Every day | RECTAL | Status: DC

## 2020-03-28 MED ORDER — ASPIRIN EC 325 MG PO TBEC
325.0000 mg | DELAYED_RELEASE_TABLET | Freq: Every day | ORAL | Status: DC
  Administered 2020-03-28 – 2020-03-30 (×3): 325 mg via ORAL
  Filled 2020-03-28 (×3): qty 1

## 2020-03-28 MED ORDER — KCL IN DEXTROSE-NACL 20-5-0.45 MEQ/L-%-% IV SOLN
INTRAVENOUS | Status: DC
  Filled 2020-03-28 (×16): qty 1000

## 2020-03-28 NOTE — Progress Notes (Signed)
SLP Cancellation Note  Patient Details Name: OSWALD POTT MRN: [id number] DOB: [birth year]   Cancelled treatment:       Reason Eval/Treat Not Completed: Patient at procedure or test/unavailable  Pt unavailable, at MRI. Will follow tomorrow for bedside swallow evaluation.   Lynann Demetrius B. Rutherford Nail M.S., CCC-SLP, [place] Office [phone]  Stormy Fabian 03/28/2020, 4:15 PM

## 2020-03-28 NOTE — ED Provider Notes (Signed)
-----------------------------------------   12:29 PM on 03/28/2020 -----------------------------------------  I took verbal report on the MRI result of acute infarct from radiology.  I immediately told Dr. Si Raider, who is currently taking care of the patient and is here in the ED at this time.     Arta Silence, MD 03/28/20 1230

## 2020-03-28 NOTE — Consult Note (Signed)
Pharmacy Antibiotic Note  Gerald [name] is a 87 y.o. male admitted on 03/28/2020 with proctitis, possible CAP.  Pharmacy has been consulted for pip/tazo dosing.  Plan: Zosyn 3.375g IV q8h (4 hour infusion).  Height: 5\' 9"  (175.3 cm) Weight: 63 kg (138 lb 14.2 oz) IBW/kg (Calculated) : 70.7  Temp (24hrs), Avg:98 F (36.7 C), Min:97.6 F (36.4 C), Max:98.5 F (36.9 C)  Recent Labs  Lab 03/27/20 1514  WBC 11.0*  CREATININE 0.84    Estimated Creatinine Clearance: 44.8 mL/min (by C-G formula based on SCr of 0.84 mg/dL).    Allergies  Allergen Reactions  . Levofloxacin Other (See Comments)    Taste alterations for about 1 month after taking med    Antimicrobials this admission: 1/11 pip/tazo >>   Dose adjustments this admission: none  Microbiology results: none  Thank you for allowing pharmacy to be a part of this patient's care.  Oswald Hillock 03/28/2020 8:56 AM

## 2020-03-28 NOTE — Progress Notes (Signed)
[place] received call from ED secretary asking for [facility] visit on behalf of family.  When [facility] arrived, pt.'s dtr. shared pt. is 87yo, and current situation 'isn't an emergency' but that she requests [place] priest to be called for Anointing of the [place], particularly Fr. Vincent from [employer] in [place] if possible.  [facility] called and left message for Fr. Vincent --> will stand by for callback.  [facility] followed up w/pt. and dtr. --> they requested prayer for wisdom for family in making medical decisions re: pt.  [facility] remains available as needed and awaits call from priest.

## 2020-03-28 NOTE — Progress Notes (Signed)
*  PRELIMINARY RESULTS* Echocardiogram 2D Echocardiogram has been performed.  Gerald [name] 03/28/2020, 2:18 PM

## 2020-03-28 NOTE — H&P (Signed)
History and Physical    Gerald [name] [id number] DOB: 05-14-1920 DOA: 03/28/2020  PCP: [name], MD  Patient coming from: home   Chief Complaint: weakness, altered mental status  HPI: Gerald [name] is a 87 y.o. male with medical history significant for htn, myelodysplastic syndrome, moderate mitral regurg, anemia, distant history prostate/testicular cancer, paroxysmal AF, who presents with the above.  History obtained from daughter as patient is encephalopathic. He lives with another daughter.  She reports that he had his hip replaced a little under a year ago and since then the family has noticed a gradual decline, much less mobile than previously, less energy, cognitive decline as well.  More recently, beginning about 3 weeks ago, patient developed cough and congestion. Was treated for respiratory infection by pcp with azithromycin. covid was negative. His cough and congestion improved but he slowly deteriorated, has not gotten out of bed, has stopped speaking to family members. Cough is essentially resolved. No fevers. No vomiting or diarrhea, is intermittently constipated at home but has stooled more or less daily recently. No report of abdominal pain. Has redness of sacrum. No report of dysuria. Family hasn't noticed him not using a leg or a hand.  Presented to oncologist's office today for scheduled visit. There given his encephalopathy was referred to the ED.  ED Course:   CXR shows possible pna so stated on zosyn. CT of chest shows possible fluid overload so given lasix 20.   Review of Systems: As per HPI otherwise 10 point review of systems negative.    Past Medical History:  Diagnosis Date  . Anemia   . Arthritis   . Atrial fibrillation ([place])   . Cancer [facility])    Testicular  . Hemochromatosis 10/25/2014  . Hx of basal cell carcinoma 2008   R. ant. zygomatic/sideburn 2008, L preauricular 2019  . Hypertension   . Prostate cancer [facility])     Past  Surgical History:  Procedure Laterality Date  . ANTERIOR APPROACH HEMI HIP ARTHROPLASTY Left 05/28/2019   Procedure: ANTERIOR APPROACH HEMI HIP ARTHROPLASTY;  Surgeon: Hessie Knows, MD;  Location: [facility] ORS;  Service: Orthopedics;  Laterality: Left;  . APPENDECTOMY    . CHOLECYSTECTOMY    . HAND SURGERY Bilateral   . HERNIA REPAIR    . INNER EAR SURGERY    . KYPHOPLASTY N/A 12/14/2015   Procedure: KYPHOPLASTY  L2;  Surgeon: Hessie Knows, MD;  Location: [facility] ORS;  Service: Orthopedics;  Laterality: N/A;  . KYPHOPLASTY N/A 12/23/2019   Procedure: KYPHOPLASTY;  Surgeon: Hessie Knows, MD;  Location: [facility] ORS;  Service: Orthopedics;  Laterality: N/A;  . PROSTATE SURGERY    . SURGERY SCROTAL / TESTICULAR Right      reports that he quit smoking about 62 years ago. His smoking use included cigarettes. He has a 25.00 pack-year smoking history. He has never used smokeless tobacco. He reports that he does not drink alcohol and does not use drugs.  Allergies  Allergen Reactions  . Levofloxacin Other (See Comments)    Taste alterations for about 1 month after taking med    Family History  Problem Relation Age of Onset  . Hypertension Mother     Prior to Admission medications   Medication Sig Start Date End Date Taking? Authorizing Provider  acetaminophen (TYLENOL) 650 MG CR tablet Take 650 mg by mouth 2 (two) times daily as needed for pain.    [provider]  aspirin EC 81 MG tablet Take 81 mg by  mouth every evening.     [provider]  Calcium-Magnesium-Zinc (CAL-MAG-ZINC PO) Take 1 tablet by mouth daily.    [provider]  Cholecalciferol (QC VITAMIN D3) 50 MCG (2000 UT) TABS Take by mouth.    [provider]  docusate sodium (COLACE) 100 MG capsule Take 100 mg by mouth 2 (two) times daily. With breakfast & with supper    [provider]  ELDERBERRY PO Take 2 tablets by mouth daily. Gummy    [provider]  HYDROcodone-acetaminophen  (NORCO/VICODIN) 5-325 MG tablet Take 1 tablet by mouth at bedtime.  Patient not taking: Reported on 01/24/2020    [provider]  ibuprofen (ADVIL) 200 MG tablet Take 200 mg by mouth 2 (two) times daily as needed (pain.).     [provider]  Lido-Capsaicin-Men-Methyl Sal (MEDI-PATCH-LIDOCAINE EX) Apply 1 patch topically daily. Applied to back    [provider]  Multiple Vitamins-Minerals (ADULT GUMMY PO) Take 2 tablets by mouth daily.    [provider]  naproxen sodium (ALEVE) 220 MG tablet Take 440 mg by mouth at bedtime.     [provider]  Potassium 99 MG TABS Take 99 mg by mouth daily.     [provider]  Turmeric 500 MG TABS Take 500 mg by mouth at bedtime. w/Ginger    [provider]  vitamin B-12 (CYANOCOBALAMIN) 1000 MCG tablet Take 1,000 mcg by mouth daily.    [provider]    Physical Exam: Vitals:   03/28/20 0530 03/28/20 0600 03/28/20 0630 03/28/20 0700  BP:    119/81  Pulse: (!) 111 (!) 113 (!) 116 (!) 115  Resp: 20 12 18  (!) 24  Temp:      TempSrc:      SpO2: 96% 94%  98%  Weight:      Height:        Constitutional: No acute distress, awake, makes eye contact but doesn't speak Head: Atraumatic Eyes: Conjunctiva clear ENM: dry mucous membranes. Normal dentition.  Neck: Supple Respiratory: poor effort, no tachypnea or use of accessory muscles, rales @ bases Cardiovascular: Regular rate and rhythm. Soft systolic murmur Abdomen: Non-tender, non-distended. No masses. No rebound or guarding. Positive bowel sounds. Musculoskeletal: No joint deformity upper and lower extremities. Normal ROM, no contractures. Normal muscle tone.  Skin: No rashes, lesions, or ulcers.  Extremities: No peripheral edema. Palpable peripheral pulses. Neurologic: awake, moving all 4 extremities. Psychiatric: unable to assess, not compative   Labs on Admission: I have personally reviewed following labs and imaging  studies  CBC: Recent Labs  Lab 03/27/20 1514  WBC 11.0*  NEUTROABS 7.6  HGB 10.3*  HCT 32.0*  MCV 94.7  PLT [id number]*   Basic Metabolic Panel: Recent Labs  Lab 03/27/20 1514  NA 140  K 3.4*  CL 103  CO2 26  GLUCOSE 130*  BUN 22  CREATININE 0.84  CALCIUM 9.8   GFR: Estimated Creatinine Clearance: 44.8 mL/min (by C-G formula based on SCr of 0.84 mg/dL). Liver Function Tests: Recent Labs  Lab 03/27/20 1514  AST 18  ALT 13  ALKPHOS 61  BILITOT 1.1  PROT 7.0  ALBUMIN 3.7   No results for input(s): LIPASE, AMYLASE in the last 168 hours. No results for input(s): AMMONIA in the last 168 hours. Coagulation Profile: No results for input(s): INR, PROTIME in the last 168 hours. Cardiac Enzymes: No results for input(s): CKTOTAL, CKMB, CKMBINDEX, TROPONINI in the last 168 hours. BNP (last 3  results) No results for input(s): PROBNP in the last 8760 hours. HbA1C: No results for input(s): HGBA1C in the last 72 hours. CBG: No results for input(s): GLUCAP in the last 168 hours. Lipid Profile: No results for input(s): CHOL, HDL, LDLCALC, TRIG, CHOLHDL, LDLDIRECT in the last 72 hours. Thyroid Function Tests: No results for input(s): TSH, T4TOTAL, FREET4, T3FREE, THYROIDAB in the last 72 hours. Anemia Panel: No results for input(s): VITAMINB12, FOLATE, FERRITIN, TIBC, IRON, RETICCTPCT in the last 72 hours. Urine analysis:    Component Value Date/Time   COLORURINE YELLOW (A) 03/28/2020 0306   APPEARANCEUR CLEAR (A) 03/28/2020 0306   APPEARANCEUR Clear 05/05/2013 1500   LABSPEC 1.012 03/28/2020 0306   LABSPEC 1.014 05/05/2013 1500   PHURINE 6.0 03/28/2020 0306   GLUCOSEU NEGATIVE 03/28/2020 0306   GLUCOSEU Negative 05/05/2013 1500   HGBUR NEGATIVE 03/28/2020 0306   BILIRUBINUR NEGATIVE 03/28/2020 0306   BILIRUBINUR Negative 05/05/2013 1500   KETONESUR NEGATIVE 03/28/2020 0306   PROTEINUR NEGATIVE 03/28/2020 0306   NITRITE NEGATIVE 03/28/2020 0306   LEUKOCYTESUR NEGATIVE  03/28/2020 0306   LEUKOCYTESUR Negative 05/05/2013 1500    Radiological Exams on Admission: DG Chest 1 View  Result Date: 03/28/2020 CLINICAL DATA:  Altered mental status EXAM: CHEST  1 VIEW COMPARISON:  05/27/2019 FINDINGS: Heart is normal size. Diffuse interstitial prominence throughout the lungs, favor chronic lung disease. No effusions. Aortic atherosclerosis. No acute bony abnormality. IMPRESSION: Diffuse interstitial prominence throughout the lungs, favor chronic interstitial lung disease. Alternatively, this could reflect viral/atypical infection. Electronically Signed   By: Rolm Baptise M.D.   On: 03/28/2020 00:23   CT Head Wo Contrast  Result Date: 03/28/2020 CLINICAL DATA:  Mental status changes EXAM: CT HEAD WITHOUT CONTRAST TECHNIQUE: Contiguous axial images were obtained from the base of the skull through the vertex without intravenous contrast. COMPARISON:  None. 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 Chronic bilateral basal ganglia lacunar infarcts. There is atrophy and chronic small vessel disease changes. No acute intracranial abnormality. Specifically, no hemorrhage, hydrocephalus, mass lesion, acute infarction, or significant intracranial injury. Vascular: No hyperdense vessel or unexpected calcification. Skull: No acute calvarial abnormality. Sinuses/Orbits: Air-fluid level in the right maxillary sinus. 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 Atrophy, chronic microvascular disease. No acute intracranial abnormality. Electronically Signed   By: Rolm Baptise M.D.   On: 03/28/2020 00:21   CT Angio Chest PE W and/or Wo Contrast  Result Date: 03/28/2020 CLINICAL DATA:  Lethargy, possible pulmonary embolism, abdominal pain EXAM: CT ANGIOGRAPHY CHEST CT ABDOMEN AND PELVIS WITH CONTRAST TECHNIQUE: Multidetector CT imaging of the chest was performed using the standard protocol during bolus administration of  intravenous contrast. Multiplanar CT image reconstructions and MIPs were obtained to evaluate the vascular anatomy. Multidetector CT imaging of the abdomen and pelvis was performed using the standard protocol during bolus administration of intravenous contrast. CONTRAST:  158mL OMNIPAQUE IOHEXOL 350 MG/ML SOLN COMPARISON:  MR lumbar spine 12/20/2019, fluoroscopy lumbar spine 12/23/2019, 12/14/2015, CT abdomen pelvis 12/28/1998 (report only) FINDINGS: CTA CHEST FINDINGS Cardiovascular: Satisfactory opacification of pulmonary arteries. No large central or lobar pulmonary arterial filling defects are identified. Respiratory motion may limit detection of smaller, subsegmental pulmonary emboli. Borderline dilatation of the the pulmonary trunk. Cardiac size is top normal. Three-vessel coronary artery atherosclerosis is noted. Trace pericardial fluid including fluid within the pericardial recesses. Ascending thoracic aortic dilatation to 4.3 cm. Atherosclerotic plaque throughout the thoracic aorta. No acute luminal abnormality. No focal periaortic  stranding or hemorrhage. Cerebral region of the brachiocephalic and left common carotid arteries. The left vertebral artery arises directly from the aortic arch between this shared origin and the left subclavian artery. Proximal great vessels are otherwise unremarkable. Mediastinum/Nodes: Fluid in the pericardial recesses. No mediastinal gas. Normal thyroid gland and thoracic inlet. No acute abnormality of the esophagus. Secretions noted in the trachea, posterior bowing likely related to imaging during exhalation. No worrisome mediastinal, hilar or axillary adenopathy. Shotty low-attenuation subcentimeter mediastinal and hilar lymph nodes are favored to be edematous or reactive. Lungs/Pleura: Diffuse airways thickening and scattered secretions. Diffuse interlobular septal thickening, pulmonary vascular redistribution and hazy ground-glass opacity particularly within the more  dependent portions of the lungs are findings most compatible with interstitial and early alveolar edema. Small to moderate bilateral pleural effusions are present, right slightly greater than left. Adjacent passive atelectatic changes. No pneumothorax. Musculoskeletal: T12 compression deformity with vertebroplasty changes, with 20% residual height loss not significantly changed from fluoroscopic images at the time of procedure 12/23/2019. No new acute osseous abnormalities or suspicious lytic or blastic lesions are seen. No worrisome chest wall masses or lesions. Review of the MIP images confirms the above findings. CT ABDOMEN and PELVIS FINDINGS Hepatobiliary: Few punctate parenchymal calcifications within the liver, largest in segment 4) 5/21) possibly vascular versus prior granulomatous disease. No worrisome focal liver lesions. Smooth liver surface contour. Normal hepatic attenuation. Prior cholecystectomy. Marked distension of the intra and extrahepatic biliary tree with the common bile duct measuring up to 15 mm in diameter, greater than expected for typical post cholecystectomy reservoir effect and senescent change. No visible intraductal gallstones. Pancreas: No pancreatic ductal dilatation or surrounding inflammatory changes. Spleen: Heterogeneous enhancement the spleen is nonspecific and possibly related to arterial phase of contrast imaging though a slightly more wedge-shaped area of hypoattenuation is seen in the posterior spleen (5/20) which could reflect an early splenic infarct. Adrenals/Urinary Tract: No concerning adrenal nodules or masses. There is a hyperdense cystic appearing lesion measuring up to 2.8 cm in the lower pole left kidney though demonstrate some conspicuous increase in attenuation of approximately 15 Hounsfield units on the delayed phase imaging (5/38, 10/20) more simple appearing cysts are seen elsewhere in both kidneys with a slightly larger minimally complex cyst with thin  internal septations arising from the upper pole right kidney measuring up to 2 cm in size (5/38) no other concerning renal lesions. Kidneys enhance and excrete symmetrically. No urolithiasis or hydronephrosis. Excreted contrast material is noted within the collecting system from separate contrast bolus administration. Urinary bladder is otherwise unremarkable. Stomach/Bowel: Sliding-type hiatal hernia. Stomach and duodenum are unremarkable. No small bowel thickening or dilatation. The appendix is surgically absent. No colonic dilatation or wall thickening. Scattered colonic diverticula without focal inflammation to suggest diverticulitis. Some mild segmental thickening of the sigmoid in a region of numerous colonic diverticula without acute pericolonic or focal diverticular inflammation could reflect sequela of prior inflammation. Mild circumferential anorectal thickening with perirectal fat stranding is nonspecific though could correlate for features of proctitis. Vascular/Lymphatic: Atherosclerotic calcifications within the abdominal aorta and branch vessels. No aneurysm or ectasia. No enlarged abdominopelvic lymph nodes. Reproductive: Prostate appears surgically absent. Other: Perirectal hazy stranding, as above. Postsurgical changes from prior low vertical midline incision. Mild body wall edema. No abdominopelvic free air or fluid. Musculoskeletal: Remote compression deformity L2 with evidence of prior vertebroplasty in slight extravasation of cement into the L3-4 disc space. Up to 70% residual height loss centrally. Stepwise likely degenerative retrolisthesis L2-L5.  Additional transitional lumbosacral anatomy, with what is likely a rudimentary disc between the S1 and S2 levels. The osseous structures appear diffusely demineralized which may limit detection of small or nondisplaced fractures. No acute osseous abnormality or suspicious osseous lesion. Prior left hip arthroplasty with streak artifact. Hardware is  in expected alignment without complication. Review of the MIP images confirms the above findings. IMPRESSION: 1. No evidence of large central or lobar pulmonary arterial filling defects to suggest pulmonary embolism. Respiratory motion may limit detection of smaller, subsegmental pulmonary emboli. 2. Features of congestive heart failure with interstitial and early alveolar edema, small to moderate bilateral pleural effusions. 3. Ascending thoracic aortic dilatation to 4.3 cm. No evidence of dissection. Guidelines recommend annual imaging followup by CTA or MRA. This recommendation follows 2010 ACCF/AHA/AATS/ACR/ASA/SCA/SCAI/SIR/STS/SVM Guidelines for the Diagnosis and Management of Patients with Thoracic Aortic Disease. Circulation. 2010; [id number]. Aortic aneurysm NOS (ICD10-I71.9) 4. Heterogeneous enhancement the spleen is nonspecific and possibly related to arterial phase of contrast imaging though a slightly more wedge-shaped area of hypoattenuation is seen in the posterior spleen could reflect an early splenic infarct. 5. Mild circumferential anorectal thickening with perirectal fat stranding is nonspecific though could correlate for features of proctitis, and consider direct visualization. 6. Segmental thickening of the mid sigmoid colon in a region of numerous colonic diverticula the albeit without acute inflammation at this time, may reflect sequela of prior diverticular inflammation. 7. Hyperdense cystic appearing lesion measuring up to 2.8 cm in the lower pole left kidney though demonstrate some conspicuous increase in attenuation of approximately 15 Hounsfield units on the delayed phase imaging. Recommend further evaluation with nonemergent renal ultrasound for further evaluation. 8. Sliding-type hiatal hernia. 9. Compression deformities and vertebroplasty changes T12 and L2. Electronically Signed   By: Lovena Le M.D.   On: 03/28/2020 05:03   CT ABDOMEN PELVIS W CONTRAST  Result Date:  03/28/2020 CLINICAL DATA:  Lethargy, possible pulmonary embolism, abdominal pain EXAM: CT ANGIOGRAPHY CHEST CT ABDOMEN AND PELVIS WITH CONTRAST TECHNIQUE: Multidetector CT imaging of the chest was performed using the standard protocol during bolus administration of intravenous contrast. Multiplanar CT image reconstructions and MIPs were obtained to evaluate the vascular anatomy. Multidetector CT imaging of the abdomen and pelvis was performed using the standard protocol during bolus administration of intravenous contrast. CONTRAST:  165mL OMNIPAQUE IOHEXOL 350 MG/ML SOLN COMPARISON:  MR lumbar spine 12/20/2019, fluoroscopy lumbar spine 12/23/2019, 12/14/2015, CT abdomen pelvis 12/28/1998 (report only) FINDINGS: CTA CHEST FINDINGS Cardiovascular: Satisfactory opacification of pulmonary arteries. No large central or lobar pulmonary arterial filling defects are identified. Respiratory motion may limit detection of smaller, subsegmental pulmonary emboli. Borderline dilatation of the the pulmonary trunk. Cardiac size is top normal. Three-vessel coronary artery atherosclerosis is noted. Trace pericardial fluid including fluid within the pericardial recesses. Ascending thoracic aortic dilatation to 4.3 cm. Atherosclerotic plaque throughout the thoracic aorta. No acute luminal abnormality. No focal periaortic stranding or hemorrhage. Cerebral region of the brachiocephalic and left common carotid arteries. The left vertebral artery arises directly from the aortic arch between this shared origin and the left subclavian artery. Proximal great vessels are otherwise unremarkable. Mediastinum/Nodes: Fluid in the pericardial recesses. No mediastinal gas. Normal thyroid gland and thoracic inlet. No acute abnormality of the esophagus. Secretions noted in the trachea, posterior bowing likely related to imaging during exhalation. No worrisome mediastinal, hilar or axillary adenopathy. Shotty low-attenuation subcentimeter mediastinal  and hilar lymph nodes are favored to be edematous or reactive. Lungs/Pleura: Diffuse airways thickening and scattered  secretions. Diffuse interlobular septal thickening, pulmonary vascular redistribution and hazy ground-glass opacity particularly within the more dependent portions of the lungs are findings most compatible with interstitial and early alveolar edema. Small to moderate bilateral pleural effusions are present, right slightly greater than left. Adjacent passive atelectatic changes. No pneumothorax. Musculoskeletal: T12 compression deformity with vertebroplasty changes, with 20% residual height loss not significantly changed from fluoroscopic images at the time of procedure 12/23/2019. No new acute osseous abnormalities or suspicious lytic or blastic lesions are seen. No worrisome chest wall masses or lesions. Review of the MIP images confirms the above findings. CT ABDOMEN and PELVIS FINDINGS Hepatobiliary: Few punctate parenchymal calcifications within the liver, largest in segment 4) 5/21) possibly vascular versus prior granulomatous disease. No worrisome focal liver lesions. Smooth liver surface contour. Normal hepatic attenuation. Prior cholecystectomy. Marked distension of the intra and extrahepatic biliary tree with the common bile duct measuring up to 15 mm in diameter, greater than expected for typical post cholecystectomy reservoir effect and senescent change. No visible intraductal gallstones. Pancreas: No pancreatic ductal dilatation or surrounding inflammatory changes. Spleen: Heterogeneous enhancement the spleen is nonspecific and possibly related to arterial phase of contrast imaging though a slightly more wedge-shaped area of hypoattenuation is seen in the posterior spleen (5/20) which could reflect an early splenic infarct. Adrenals/Urinary Tract: No concerning adrenal nodules or masses. There is a hyperdense cystic appearing lesion measuring up to 2.8 cm in the lower pole left kidney  though demonstrate some conspicuous increase in attenuation of approximately 15 Hounsfield units on the delayed phase imaging (5/38, 10/20) more simple appearing cysts are seen elsewhere in both kidneys with a slightly larger minimally complex cyst with thin internal septations arising from the upper pole right kidney measuring up to 2 cm in size (5/38) no other concerning renal lesions. Kidneys enhance and excrete symmetrically. No urolithiasis or hydronephrosis. Excreted contrast material is noted within the collecting system from separate contrast bolus administration. Urinary bladder is otherwise unremarkable. Stomach/Bowel: Sliding-type hiatal hernia. Stomach and duodenum are unremarkable. No small bowel thickening or dilatation. The appendix is surgically absent. No colonic dilatation or wall thickening. Scattered colonic diverticula without focal inflammation to suggest diverticulitis. Some mild segmental thickening of the sigmoid in a region of numerous colonic diverticula without acute pericolonic or focal diverticular inflammation could reflect sequela of prior inflammation. Mild circumferential anorectal thickening with perirectal fat stranding is nonspecific though could correlate for features of proctitis. Vascular/Lymphatic: Atherosclerotic calcifications within the abdominal aorta and branch vessels. No aneurysm or ectasia. No enlarged abdominopelvic lymph nodes. Reproductive: Prostate appears surgically absent. Other: Perirectal hazy stranding, as above. Postsurgical changes from prior low vertical midline incision. Mild body wall edema. No abdominopelvic free air or fluid. Musculoskeletal: Remote compression deformity L2 with evidence of prior vertebroplasty in slight extravasation of cement into the L3-4 disc space. Up to 70% residual height loss centrally. Stepwise likely degenerative retrolisthesis L2-L5. Additional transitional lumbosacral anatomy, with what is likely a rudimentary disc between  the S1 and S2 levels. The osseous structures appear diffusely demineralized which may limit detection of small or nondisplaced fractures. No acute osseous abnormality or suspicious osseous lesion. Prior left hip arthroplasty with streak artifact. Hardware is in expected alignment without complication. Review of the MIP images confirms the above findings. IMPRESSION: 1. No evidence of large central or lobar pulmonary arterial filling defects to suggest pulmonary embolism. Respiratory motion may limit detection of smaller, subsegmental pulmonary emboli. 2. Features of congestive heart failure with interstitial and early  alveolar edema, small to moderate bilateral pleural effusions. 3. Ascending thoracic aortic dilatation to 4.3 cm. No evidence of dissection. Guidelines recommend annual imaging followup by CTA or MRA. This recommendation follows 2010 ACCF/AHA/AATS/ACR/ASA/SCA/SCAI/SIR/STS/SVM Guidelines for the Diagnosis and Management of Patients with Thoracic Aortic Disease. Circulation. 2010; [id number]. Aortic aneurysm NOS (ICD10-I71.9) 4. Heterogeneous enhancement the spleen is nonspecific and possibly related to arterial phase of contrast imaging though a slightly more wedge-shaped area of hypoattenuation is seen in the posterior spleen could reflect an early splenic infarct. 5. Mild circumferential anorectal thickening with perirectal fat stranding is nonspecific though could correlate for features of proctitis, and consider direct visualization. 6. Segmental thickening of the mid sigmoid colon in a region of numerous colonic diverticula the albeit without acute inflammation at this time, may reflect sequela of prior diverticular inflammation. 7. Hyperdense cystic appearing lesion measuring up to 2.8 cm in the lower pole left kidney though demonstrate some conspicuous increase in attenuation of approximately 15 Hounsfield units on the delayed phase imaging. Recommend further evaluation with nonemergent renal  ultrasound for further evaluation. 8. Sliding-type hiatal hernia. 9. Compression deformities and vertebroplasty changes T12 and L2. Electronically Signed   By: Lovena Le M.D.   On: 03/28/2020 05:03    EKG: Independently reviewed. nsr  Assessment/Plan Active Problems:   Anemia   A-fib (HCC)   Essential (primary) hypertension   Hemochromatosis   MDS (myelodysplastic syndrome), low grade (HCC)   Acute proctitis   Acute encephalopathy   Pressure injury of sacral region, stage 2 ([place])   # Acute encephalopathy Etiology unclear. CTH w/o acute findings. May be infected given mild leukocytosis but no localizing symptoms. Recent URI but that appears mostly resolved. CT shows possible proctitis maybe 2/2 chronic constipation though report of stooling regularly as of late. Urinalysis not suggestive of infection. CT does show some signs of fluid overload but breathing comfortably on room air. covid negative. Normal glucose, no sig electrolyte abnormalities. No signs hepatic dysfunction or uremia. Not on meds associated with this. Could represent end of life condition in this very elderly patient. - full code for now but daughter will discuss w/ other family members, she does think at this point patient would probably want to be DNR - will check MRI of brain to r/o stroke - mrsa screen will add vanc if positive, will continue zosyn for now, presume would treat acute proctatitis. Will f/u blood and urine cultures, and also baseline procalcitonin. Likely stop abx in next 24-48 hours - f/u lactate - slp swallow eval, dysphagia 2 diet to start  # Interstitial edema on CT of chest # History moderate mitral regurg Seen on CTA. No PE. ED gave lasix x1. On my exam actually appears a bit dry - gentle hydration fluids @ 100 - f/u bnp and TTE  # Myelodysplastic syndrome # Anemia Mild per onc. Here hgb is actually higher than baseline, perhaps 2/2 mild dehydration - f/u smear  # Hypokalemia Mild, 3.4 -  f/u mg; giving 20 meq of K w/ fluids  # Atrial fibrillation, paroxysmal Not anticoagualted due to fall risk and age.  - hold home aspirin, benefit at this point doubtful  # Stage 2 pressure ulcer - nursing wound care ordered  # splenic infarct? possible splenic infarct seen on CTA. No report of abdominal pain.  - will monitor for now  # Proctitis? Does have hx constipation. Doubtful would be cause of encephalopathy if present - zosyn for now - consider GI consult  #  Cystic lesion in kidney - outpt renal u/s, consider  # HTN Here bp wnl, not on home meds - ctm  # History constipation - cont home colace  DVT prophylaxis: lovenox Code Status: full  Family Communication: daughter updated @ bedside  Consults called: none    Status is: Inpatient  Remains inpatient appropriate because:Inpatient level of care appropriate due to severity of illness   Dispo: The patient is from: Home              Anticipated d/c is to: tbd              Anticipated d/c date is: 3 days              Patient currently is not medically stable to d/c.        Desma Maxim MD [facility] Pager [phone]  If 7PM-7AM, please contact night-coverage [email or web address] Password [email or web address]  03/28/2020, 9:08 AM

## 2020-03-28 NOTE — ED Notes (Signed)
Changed patients bedding and cleaned patient up.

## 2020-03-28 NOTE — Consult Note (Signed)
NEURO HOSPITALIST CONSULT NOTE   Requestig physician: Dr. Si Raider  Reason for Consult: Progressive difficulty with speech and cognition  History obtained from:  Daughters and Chart    HPI:                                                                                                                                          Gerald [name] is an 87 y.o. male with a PMHx of atrial fibrillation, hemochromatosis, myelodysplastic syndrome with anemia, HTN, prostate cancer and arthritis, presenting to the ED with several weeks of progressive diffuse weakness and decreased speech, followed by more precipitous drop in his functioning to a state described as lethargy, since Friday.   He was in relatively good health until March of 2021, when he sustained a fall, fracturing his hip. This affected his mobility and per family, he has not been the same since then. In September he underwent a kyphoplasty, with subsequent further worsening of mobility. He then developed cold-like symptoms about 3 weeks ago, with further worsening of his weakness as well as decreased speech output.    Further history obtained from EDP note: "87 y.o. male with a history of anemia, atrial fibrillation on aspirin, hemochromatosis, hypertension who presents from the cancer center for generalized weakness.  History is gathered mostly from patient's daughter who is at bedside.  According to her patient had a head cold the week before Christmas for which she was treated with a Z-Pak.  Since then patient has become progressively weaker and has been unable to ambulate.  Has been in bed for the last 3 weeks.  Family was concerned that he was probably anemic and schedule an appointment with the cancer center which was today.  When patient arrived he was extremely weak, unable to answer any questions and was sent to the emergency room for evaluation.  He has had a mild cough but no fevers at home, no hypoxia, no vomiting or  diarrhea.  According to the daughter patient has been nonverbal for the last several weeks due to generalized weakness.  Has had minimal p.o. intake.  No known exposures to COVID."  An MRI was obtained in the ED, revealing an acute punctate right pontine lacunar infarction.   Past Medical History:  Diagnosis Date  . Anemia   . Arthritis   . Atrial fibrillation ([place])   . Cancer [facility])    Testicular  . Hemochromatosis 10/25/2014  . Hx of basal cell carcinoma 2008   R. ant. zygomatic/sideburn 2008, L preauricular 2019  . Hypertension   . Prostate cancer [facility])     Past Surgical History:  Procedure Laterality Date  . ANTERIOR APPROACH HEMI HIP ARTHROPLASTY Left 05/28/2019   Procedure: ANTERIOR APPROACH HEMI HIP ARTHROPLASTY;  Surgeon: Hessie Knows, MD;  Location: [facility] ORS;  Service: Orthopedics;  Laterality: Left;  . APPENDECTOMY    . CHOLECYSTECTOMY    . HAND SURGERY Bilateral   . HERNIA REPAIR    . INNER EAR SURGERY    . KYPHOPLASTY N/A 12/14/2015   Procedure: KYPHOPLASTY  L2;  Surgeon: Kennedy Bucker, MD;  Location: [facility] ORS;  Service: Orthopedics;  Laterality: N/A;  . KYPHOPLASTY N/A 12/23/2019   Procedure: KYPHOPLASTY;  Surgeon: Kennedy Bucker, MD;  Location: [facility] ORS;  Service: Orthopedics;  Laterality: N/A;  . PROSTATE SURGERY    . SURGERY SCROTAL / TESTICULAR Right     Family History  Problem Relation Age of Onset  . Hypertension Mother               Social History:  reports that he quit smoking about 62 years ago. His smoking use included cigarettes. He has a 25.00 pack-year smoking history. He has never used smokeless tobacco. He reports that he does not drink alcohol and does not use drugs.  Allergies  Allergen Reactions  . Levofloxacin Other (See Comments)    Taste alterations for about 1 month after taking med    MEDICATIONS:                                                                                                                      No current  facility-administered medications on file prior to encounter.   Current Outpatient Medications on File Prior to Encounter  Medication Sig Dispense Refill  . acetaminophen (TYLENOL) 650 MG CR tablet Take 650 mg by mouth 2 (two) times daily as needed for pain.    . Calcium-Magnesium-Zinc (CAL-MAG-ZINC PO) Take 1 tablet by mouth daily.    . Cholecalciferol (QC VITAMIN D3) 50 MCG (2000 UT) TABS Take 2,000 Units by mouth daily.    [name] docusate sodium (COLACE) 100 MG capsule Take 100 mg by mouth 2 (two) times daily. With breakfast & with supper    . ELDERBERRY PO Take 2 tablets by mouth daily. Gummy    . Multiple Vitamins-Minerals (ADULT GUMMY PO) Take 2 tablets by mouth daily.    . Potassium 99 MG TABS Take 99 mg by mouth daily.     . vitamin B-12 (CYANOCOBALAMIN) 1000 MCG tablet Take 1,000 mcg by mouth daily.    [name] aspirin EC 81 MG tablet Take 81 mg by mouth every evening.     [name] HYDROcodone-acetaminophen (NORCO/VICODIN) 5-325 MG tablet Take 1 tablet by mouth at bedtime.  (Patient not taking: No sig reported)    . ibuprofen (ADVIL) 200 MG tablet Take 200 mg by mouth 2 (two) times daily as needed (pain.).     [name] Lido-Capsaicin-Men-Methyl Sal (MEDI-PATCH-LIDOCAINE EX) Apply 1 patch topically daily. Applied to back    . naproxen sodium (ALEVE) 220 MG tablet Take 440 mg by mouth at bedtime.     . Turmeric 500 MG TABS Take 500 mg by mouth at bedtime. w/Ginger       ROS:  Unable to obtain due to AMS.    Blood pressure (!) 143/78, pulse 91, temperature 98 F (36.7 C), temperature source Oral, resp. rate (!) 32, height 5\' 9"  (1.753 m), weight 63 kg, SpO2 95 %.   General Examination:                                                                                                       Physical Exam  HEENT-  [place]/AT   Lungs- Intermittent SOB with a Cheyne-Stokes pattern of  crescendo-decrescendo respiratory rate and respiratory depth Extremities- Mild edema to feet bilaterally   Neurological Examination Mental Status: Drowsy with decreased level of alertness when aroused to an awake state. Poor attention. Minimal speech output in the context of fatigued overall appearance and intermittent SOB. Will name his thumb but not his pinky or index finger. Will follow only some simple commands. Not oriented to place or time.   Cranial Nerves: II: Temporal visual fields grossly intact with no extinction to DSS. Pupils round and reactive bilaterally, right 3 mm, left 2 mm.  III,IV, VI: No ptosis. Will briefly track to left and right. Eyes are conjugate V,VII: Reacts to touch bilaterally. Weak smile is symmetric.  VIII: hearing intact to some questions and commands IX,X: Hypophonic, breathy speech XI: No asymmetry XII: Midline tongue extension Motor: BUE remain elevated on request after examiner lifts them, without drift. Grips 4/5 bilaterally. Otherwise not following upper extremity motor commands.  BLE with weak withdrawal to noxious, 3-4/5.  Increased tone x 4.  Cogwheeling of BUE noted.  Sensory: FT sensation intact bilaterally to upper and lower extremities.  Deep Tendon Reflexes: Brisk low-amplitude BUE reflexes. Hypoactive patellar reflexes.  Cerebellar: Slow FNF bilaterally with action tremor noted. No ataxia.  Gait: Unable to assess   Lab Results: Basic Metabolic Panel: Recent Labs  Lab 03/27/20 1514 03/28/20 0914  NA 140  --   K 3.4*  --   CL 103  --   CO2 26  --   GLUCOSE 130*  --   BUN 22  --   CREATININE 0.84  --   CALCIUM 9.8  --   MG  --  1.9    CBC: Recent Labs  Lab 03/27/20 1514  WBC 11.0*  NEUTROABS 7.6  HGB 10.3*  HCT 32.0*  MCV 94.7  PLT 444*    Cardiac Enzymes: No results for input(s): CKTOTAL, CKMB, CKMBINDEX, TROPONINI in the last 168 hours.  Lipid Panel: No results for input(s): CHOL, TRIG, HDL, CHOLHDL, VLDL, LDLCALC  in the last 168 hours.  Imaging: DG Chest 1 View  Result Date: 03/28/2020 CLINICAL DATA:  Altered mental status EXAM: CHEST  1 VIEW COMPARISON:  05/27/2019 FINDINGS: Heart is normal size. Diffuse interstitial prominence throughout the lungs, favor chronic lung disease. No effusions. Aortic atherosclerosis. No acute bony abnormality. IMPRESSION: Diffuse interstitial prominence throughout the lungs, favor chronic interstitial lung disease. Alternatively, this could reflect viral/atypical infection. Electronically Signed   By: Rolm Baptise M.D.   On: 03/28/2020 00:23   CT Head Wo Contrast  Result Date: 03/28/2020  CLINICAL DATA:  Mental status changes EXAM: CT HEAD WITHOUT CONTRAST TECHNIQUE: Contiguous axial images were obtained from the base of the skull through the vertex without intravenous contrast. COMPARISON:  None. 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 Chronic bilateral basal ganglia lacunar infarcts. There is atrophy and chronic small vessel disease changes. No acute intracranial abnormality. Specifically, no hemorrhage, hydrocephalus, mass lesion, acute infarction, or significant intracranial injury. Vascular: No hyperdense vessel or unexpected calcification. Skull: No acute calvarial abnormality. Sinuses/Orbits: Air-fluid level in the right maxillary sinus. 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 Atrophy, chronic microvascular disease. No acute intracranial abnormality. Electronically Signed   By: Rolm Baptise M.D.   On: 03/28/2020 00:21   CT Angio Chest PE W and/or Wo Contrast  Result Date: 03/28/2020 CLINICAL DATA:  Lethargy, possible pulmonary embolism, abdominal pain EXAM: CT ANGIOGRAPHY CHEST CT ABDOMEN AND PELVIS WITH CONTRAST TECHNIQUE: Multidetector CT imaging of the chest was performed using the standard protocol during bolus administration of intravenous contrast. Multiplanar CT image reconstructions and  MIPs were obtained to evaluate the vascular anatomy. Multidetector CT imaging of the abdomen and pelvis was performed using the standard protocol during bolus administration of intravenous contrast. CONTRAST:  180mL OMNIPAQUE IOHEXOL 350 MG/ML SOLN COMPARISON:  MR lumbar spine 12/20/2019, fluoroscopy lumbar spine 12/23/2019, 12/14/2015, CT abdomen pelvis 12/28/1998 (report only) FINDINGS: CTA CHEST FINDINGS Cardiovascular: Satisfactory opacification of pulmonary arteries. No large central or lobar pulmonary arterial filling defects are identified. Respiratory motion may limit detection of smaller, subsegmental pulmonary emboli. Borderline dilatation of the the pulmonary trunk. Cardiac size is top normal. Three-vessel coronary artery atherosclerosis is noted. Trace pericardial fluid including fluid within the pericardial recesses. Ascending thoracic aortic dilatation to 4.3 cm. Atherosclerotic plaque throughout the thoracic aorta. No acute luminal abnormality. No focal periaortic stranding or hemorrhage. Cerebral region of the brachiocephalic and left common carotid arteries. The left vertebral artery arises directly from the aortic arch between this shared origin and the left subclavian artery. Proximal great vessels are otherwise unremarkable. Mediastinum/Nodes: Fluid in the pericardial recesses. No mediastinal gas. Normal thyroid gland and thoracic inlet. No acute abnormality of the esophagus. Secretions noted in the trachea, posterior bowing likely related to imaging during exhalation. No worrisome mediastinal, hilar or axillary adenopathy. Shotty low-attenuation subcentimeter mediastinal and hilar lymph nodes are favored to be edematous or reactive. Lungs/Pleura: Diffuse airways thickening and scattered secretions. Diffuse interlobular septal thickening, pulmonary vascular redistribution and hazy ground-glass opacity particularly within the more dependent portions of the lungs are findings most compatible with  interstitial and early alveolar edema. Small to moderate bilateral pleural effusions are present, right slightly greater than left. Adjacent passive atelectatic changes. No pneumothorax. Musculoskeletal: T12 compression deformity with vertebroplasty changes, with 20% residual height loss not significantly changed from fluoroscopic images at the time of procedure 12/23/2019. No new acute osseous abnormalities or suspicious lytic or blastic lesions are seen. No worrisome chest wall masses or lesions. Review of the MIP images confirms the above findings. CT ABDOMEN and PELVIS FINDINGS Hepatobiliary: Few punctate parenchymal calcifications within the liver, largest in segment 4) 5/21) possibly vascular versus prior granulomatous disease. No worrisome focal liver lesions. Smooth liver surface contour. Normal hepatic attenuation. Prior cholecystectomy. Marked distension of the intra and extrahepatic biliary tree with the common bile duct measuring up to 15 mm in diameter, greater than expected for typical post cholecystectomy reservoir effect and senescent change. No visible intraductal gallstones. Pancreas: No pancreatic ductal dilatation or  surrounding inflammatory changes. Spleen: Heterogeneous enhancement the spleen is nonspecific and possibly related to arterial phase of contrast imaging though a slightly more wedge-shaped area of hypoattenuation is seen in the posterior spleen (5/20) which could reflect an early splenic infarct. Adrenals/Urinary Tract: No concerning adrenal nodules or masses. There is a hyperdense cystic appearing lesion measuring up to 2.8 cm in the lower pole left kidney though demonstrate some conspicuous increase in attenuation of approximately 15 Hounsfield units on the delayed phase imaging (5/38, 10/20) more simple appearing cysts are seen elsewhere in both kidneys with a slightly larger minimally complex cyst with thin internal septations arising from the upper pole right kidney measuring up  to 2 cm in size (5/38) no other concerning renal lesions. Kidneys enhance and excrete symmetrically. No urolithiasis or hydronephrosis. Excreted contrast material is noted within the collecting system from separate contrast bolus administration. Urinary bladder is otherwise unremarkable. Stomach/Bowel: Sliding-type hiatal hernia. Stomach and duodenum are unremarkable. No small bowel thickening or dilatation. The appendix is surgically absent. No colonic dilatation or wall thickening. Scattered colonic diverticula without focal inflammation to suggest diverticulitis. Some mild segmental thickening of the sigmoid in a region of numerous colonic diverticula without acute pericolonic or focal diverticular inflammation could reflect sequela of prior inflammation. Mild circumferential anorectal thickening with perirectal fat stranding is nonspecific though could correlate for features of proctitis. Vascular/Lymphatic: Atherosclerotic calcifications within the abdominal aorta and branch vessels. No aneurysm or ectasia. No enlarged abdominopelvic lymph nodes. Reproductive: Prostate appears surgically absent. Other: Perirectal hazy stranding, as above. Postsurgical changes from prior low vertical midline incision. Mild body wall edema. No abdominopelvic free air or fluid. Musculoskeletal: Remote compression deformity L2 with evidence of prior vertebroplasty in slight extravasation of cement into the L3-4 disc space. Up to 70% residual height loss centrally. Stepwise likely degenerative retrolisthesis L2-L5. Additional transitional lumbosacral anatomy, with what is likely a rudimentary disc between the S1 and S2 levels. The osseous structures appear diffusely demineralized which may limit detection of small or nondisplaced fractures. No acute osseous abnormality or suspicious osseous lesion. Prior left hip arthroplasty with streak artifact. Hardware is in expected alignment without complication. Review of the MIP images  confirms the above findings. IMPRESSION: 1. No evidence of large central or lobar pulmonary arterial filling defects to suggest pulmonary embolism. Respiratory motion may limit detection of smaller, subsegmental pulmonary emboli. 2. Features of congestive heart failure with interstitial and early alveolar edema, small to moderate bilateral pleural effusions. 3. Ascending thoracic aortic dilatation to 4.3 cm. No evidence of dissection. Guidelines recommend annual imaging followup by CTA or MRA. This recommendation follows 2010 ACCF/AHA/AATS/ACR/ASA/SCA/SCAI/SIR/STS/SVM Guidelines for the Diagnosis and Management of Patients with Thoracic Aortic Disease. Circulation. 2010; [id number]. Aortic aneurysm NOS (ICD10-I71.9) 4. Heterogeneous enhancement the spleen is nonspecific and possibly related to arterial phase of contrast imaging though a slightly more wedge-shaped area of hypoattenuation is seen in the posterior spleen could reflect an early splenic infarct. 5. Mild circumferential anorectal thickening with perirectal fat stranding is nonspecific though could correlate for features of proctitis, and consider direct visualization. 6. Segmental thickening of the mid sigmoid colon in a region of numerous colonic diverticula the albeit without acute inflammation at this time, may reflect sequela of prior diverticular inflammation. 7. Hyperdense cystic appearing lesion measuring up to 2.8 cm in the lower pole left kidney though demonstrate some conspicuous increase in attenuation of approximately 15 Hounsfield units on the delayed phase imaging. Recommend further evaluation with nonemergent renal ultrasound for further  evaluation. 8. Sliding-type hiatal hernia. 9. Compression deformities and vertebroplasty changes T12 and L2. Electronically Signed   By: Lovena Le M.D.   On: 03/28/2020 05:03   MR BRAIN WO CONTRAST  Result Date: 03/28/2020 CLINICAL DATA:  Mental status change, unknown cause. EXAM: MRI HEAD  WITHOUT CONTRAST TECHNIQUE: Multiplanar, multiecho pulse sequences of the brain and surrounding structures were obtained without intravenous contrast. COMPARISON:  CT head 03/28/2020 FINDINGS: Brain: Small acute or early subacute infarct in the right upper pons (see series 5 and 6, image 18). Mild associated edema without mass effect. Remote right frontal lobe infarct with encephalomalacia and surrounding gliosis. Multiple dilated perivascular spaces versus lacunar infarcts in bilateral basal ganglia. Additional scattered T2/FLAIR hyperintensity within the white matter, compatible with chronic microvascular ischemic disease. Moderate diffuse cerebral volume loss with ex vacuo ventricular dilation. No acute hemorrhage. No hydrocephalus. No midline shift. No extra-axial fluid collection. No mass lesion. Vascular: Abnormal flow related signal within the visualized right vertebral artery, concerning for stenosis or occlusion. Skull and upper cervical spine: Diffuse T1 hypointensity of the marrow. Sinuses/Orbits: Inferior right maxillary sinus mucosal thickening. Unremarkable orbits. Other: Small right mastoid effusion. IMPRESSION: 1. Small acute or early subacute infarct in the right upper pons. Mild associated edema without mass effect. 2. Abnormal flow related signal within the visualized right vertebral artery, concerning for occlusion or significant stenosis. Recommend CTA to further evaluate. 3. Remote right frontal lobe infarct and dilated perivascular spaces versus remote lacunar infarcts in bilateral basal ganglia. 4. Moderate chronic microvascular ischemic disease and generalized cerebral volume loss. Diffuse 5. Diffuse T1 hypointensity of the marrow, which is nonspecific and could relate to chronic hypoxia (such as in smokers), chronic anemia, or lymphoproliferative disorder. Conclusions #1 and #2 were discussed with Dr. Jaclynn Guarneri Via telephone at 12:25 p.m. Electronically Signed   By: Margaretha Sheffield MD   On:  03/28/2020 12:31   CT ABDOMEN PELVIS W CONTRAST  Result Date: 03/28/2020 CLINICAL DATA:  Lethargy, possible pulmonary embolism, abdominal pain EXAM: CT ANGIOGRAPHY CHEST CT ABDOMEN AND PELVIS WITH CONTRAST TECHNIQUE: Multidetector CT imaging of the chest was performed using the standard protocol during bolus administration of intravenous contrast. Multiplanar CT image reconstructions and MIPs were obtained to evaluate the vascular anatomy. Multidetector CT imaging of the abdomen and pelvis was performed using the standard protocol during bolus administration of intravenous contrast. CONTRAST:  171mL OMNIPAQUE IOHEXOL 350 MG/ML SOLN COMPARISON:  MR lumbar spine 12/20/2019, fluoroscopy lumbar spine 12/23/2019, 12/14/2015, CT abdomen pelvis 12/28/1998 (report only) FINDINGS: CTA CHEST FINDINGS Cardiovascular: Satisfactory opacification of pulmonary arteries. No large central or lobar pulmonary arterial filling defects are identified. Respiratory motion may limit detection of smaller, subsegmental pulmonary emboli. Borderline dilatation of the the pulmonary trunk. Cardiac size is top normal. Three-vessel coronary artery atherosclerosis is noted. Trace pericardial fluid including fluid within the pericardial recesses. Ascending thoracic aortic dilatation to 4.3 cm. Atherosclerotic plaque throughout the thoracic aorta. No acute luminal abnormality. No focal periaortic stranding or hemorrhage. Cerebral region of the brachiocephalic and left common carotid arteries. The left vertebral artery arises directly from the aortic arch between this shared origin and the left subclavian artery. Proximal great vessels are otherwise unremarkable. Mediastinum/Nodes: Fluid in the pericardial recesses. No mediastinal gas. Normal thyroid gland and thoracic inlet. No acute abnormality of the esophagus. Secretions noted in the trachea, posterior bowing likely related to imaging during exhalation. No worrisome mediastinal, hilar or  axillary adenopathy. Shotty low-attenuation subcentimeter mediastinal and hilar lymph nodes are favored  to be edematous or reactive. Lungs/Pleura: Diffuse airways thickening and scattered secretions. Diffuse interlobular septal thickening, pulmonary vascular redistribution and hazy ground-glass opacity particularly within the more dependent portions of the lungs are findings most compatible with interstitial and early alveolar edema. Small to moderate bilateral pleural effusions are present, right slightly greater than left. Adjacent passive atelectatic changes. No pneumothorax. Musculoskeletal: T12 compression deformity with vertebroplasty changes, with 20% residual height loss not significantly changed from fluoroscopic images at the time of procedure 12/23/2019. No new acute osseous abnormalities or suspicious lytic or blastic lesions are seen. No worrisome chest wall masses or lesions. Review of the MIP images confirms the above findings. CT ABDOMEN and PELVIS FINDINGS Hepatobiliary: Few punctate parenchymal calcifications within the liver, largest in segment 4) 5/21) possibly vascular versus prior granulomatous disease. No worrisome focal liver lesions. Smooth liver surface contour. Normal hepatic attenuation. Prior cholecystectomy. Marked distension of the intra and extrahepatic biliary tree with the common bile duct measuring up to 15 mm in diameter, greater than expected for typical post cholecystectomy reservoir effect and senescent change. No visible intraductal gallstones. Pancreas: No pancreatic ductal dilatation or surrounding inflammatory changes. Spleen: Heterogeneous enhancement the spleen is nonspecific and possibly related to arterial phase of contrast imaging though a slightly more wedge-shaped area of hypoattenuation is seen in the posterior spleen (5/20) which could reflect an early splenic infarct. Adrenals/Urinary Tract: No concerning adrenal nodules or masses. There is a hyperdense cystic  appearing lesion measuring up to 2.8 cm in the lower pole left kidney though demonstrate some conspicuous increase in attenuation of approximately 15 Hounsfield units on the delayed phase imaging (5/38, 10/20) more simple appearing cysts are seen elsewhere in both kidneys with a slightly larger minimally complex cyst with thin internal septations arising from the upper pole right kidney measuring up to 2 cm in size (5/38) no other concerning renal lesions. Kidneys enhance and excrete symmetrically. No urolithiasis or hydronephrosis. Excreted contrast material is noted within the collecting system from separate contrast bolus administration. Urinary bladder is otherwise unremarkable. Stomach/Bowel: Sliding-type hiatal hernia. Stomach and duodenum are unremarkable. No small bowel thickening or dilatation. The appendix is surgically absent. No colonic dilatation or wall thickening. Scattered colonic diverticula without focal inflammation to suggest diverticulitis. Some mild segmental thickening of the sigmoid in a region of numerous colonic diverticula without acute pericolonic or focal diverticular inflammation could reflect sequela of prior inflammation. Mild circumferential anorectal thickening with perirectal fat stranding is nonspecific though could correlate for features of proctitis. Vascular/Lymphatic: Atherosclerotic calcifications within the abdominal aorta and branch vessels. No aneurysm or ectasia. No enlarged abdominopelvic lymph nodes. Reproductive: Prostate appears surgically absent. Other: Perirectal hazy stranding, as above. Postsurgical changes from prior low vertical midline incision. Mild body wall edema. No abdominopelvic free air or fluid. Musculoskeletal: Remote compression deformity L2 with evidence of prior vertebroplasty in slight extravasation of cement into the L3-4 disc space. Up to 70% residual height loss centrally. Stepwise likely degenerative retrolisthesis L2-L5. Additional  transitional lumbosacral anatomy, with what is likely a rudimentary disc between the S1 and S2 levels. The osseous structures appear diffusely demineralized which may limit detection of small or nondisplaced fractures. No acute osseous abnormality or suspicious osseous lesion. Prior left hip arthroplasty with streak artifact. Hardware is in expected alignment without complication. Review of the MIP images confirms the above findings. IMPRESSION: 1. No evidence of large central or lobar pulmonary arterial filling defects to suggest pulmonary embolism. Respiratory motion may limit detection of smaller, subsegmental pulmonary  emboli. 2. Features of congestive heart failure with interstitial and early alveolar edema, small to moderate bilateral pleural effusions. 3. Ascending thoracic aortic dilatation to 4.3 cm. No evidence of dissection. Guidelines recommend annual imaging followup by CTA or MRA. This recommendation follows 2010 ACCF/AHA/AATS/ACR/ASA/SCA/SCAI/SIR/STS/SVM Guidelines for the Diagnosis and Management of Patients with Thoracic Aortic Disease. Circulation. 2010; [id number]. Aortic aneurysm NOS (ICD10-I71.9) 4. Heterogeneous enhancement the spleen is nonspecific and possibly related to arterial phase of contrast imaging though a slightly more wedge-shaped area of hypoattenuation is seen in the posterior spleen could reflect an early splenic infarct. 5. Mild circumferential anorectal thickening with perirectal fat stranding is nonspecific though could correlate for features of proctitis, and consider direct visualization. 6. Segmental thickening of the mid sigmoid colon in a region of numerous colonic diverticula the albeit without acute inflammation at this time, may reflect sequela of prior diverticular inflammation. 7. Hyperdense cystic appearing lesion measuring up to 2.8 cm in the lower pole left kidney though demonstrate some conspicuous increase in attenuation of approximately 15 Hounsfield units  on the delayed phase imaging. Recommend further evaluation with nonemergent renal ultrasound for further evaluation. 8. Sliding-type hiatal hernia. 9. Compression deformities and vertebroplasty changes T12 and L2. Electronically Signed   By: Lovena Le M.D.   On: 03/28/2020 05:03    Assessment: 87 year old male with subacutely worsened difficulty with speech and cognition in the context anemia, advanced age, recent infection and progressively worsening deconditioning and fatigue since a hip fracture last March -- Cognitive impairment with progressively decreased speech output. Overall clinical findings suggestive of a possible underlying dementia with subacute cognitive worsening secondary to intecurrent illness, most likely multifactorial, including possible CHF exacerbation, intrinsic lung disease, anemia, deconditioning and infection.  -- Acute punctate right pontine lacunar infarction on MRI. There are no definite correlating findings on exam. Most likely secondary to his atrial fibrillation.  - May have underlying Parkinsonism given cogwheeling and tremor seen on examination. Family relates that prior to March, he had developed a mild shuffling gait.  - Elevated BNP and Troponin  Recommendations: 1. Per famly has not been anticoagulated due to falls risk and anemia. Would consider increasing ASA to 325 mg po qd. May give rectally if unable to take PO.  2. Echocardiogram completed with report pending.  3. Given that his stroke was most likely secondary to atrial fibrillation, unlikely that obtaining a CTA would yield additional information that would change management 4. PT/OT/Speech 5. Cheyne-Stokes pattern of breathing suggests diffuse cerebral hypofunction due to toxic or metabolic etiology. Evaluation and management of such per primary team. Also will need evaluation and management of deranged cardiac enzymes as well as CHF imaging features on CT chest.  6. Outpatient Neurology follow up for  evaluation of possible old-age onset Parkinson's disease with dementia.     Electronically signed: Dr. Kerney Elbe 03/28/2020, 3:03 PM

## 2020-03-28 NOTE — ED Provider Notes (Signed)
[facility] Emergency Department Provider Note  ____________________________________________  Time seen: Approximately 2:46 AM  I have reviewed the triage vital signs and the nursing notes.   HISTORY  Chief Complaint Altered Mental Status  Level 5 caveat:  Portions of the history and physical were unable to be obtained due to weak, unable to answer questions   HPI AUDREY ELLER is a 87 y.o. male with a history of anemia, atrial fibrillation on aspirin, hemochromatosis, hypertension who presents from the cancer center for generalized weakness.  History is gathered mostly from patient's daughter who is at bedside.  According to her patient had a head cold the week before Christmas for which she was treated with a Z-Pak.  Since then patient has become progressively weaker and has been unable to ambulate.  Has been in bed for the last 3 weeks.  Family was concerned that he was probably anemic and schedule an appointment with the cancer center which was today.  When patient arrived he was extremely weak, unable to answer any questions and was sent to the emergency room for evaluation.  He has had a mild cough but no fevers at home, no hypoxia, no vomiting or diarrhea.  According to the daughter patient has been nonverbal for the last several weeks due to generalized weakness.  Has had minimal p.o. intake.  No known exposures to COVID   Past Medical History:  Diagnosis Date  . Anemia   . Arthritis   . Atrial fibrillation ([place])   . Cancer [facility])    Testicular  . Hemochromatosis 10/25/2014  . Hx of basal cell carcinoma 2008   R. ant. zygomatic/sideburn 2008, L preauricular 2019  . Hypertension   . Prostate cancer [facility])     Patient Active Problem List   Diagnosis Date Noted  . Leukocytosis 05/28/2019  . Left displaced femoral neck fracture ([place]) 05/27/2019  . MDS (myelodysplastic syndrome), low grade ([place]) 10/18/2016  . Anemia of chronic disease 10/21/2015   . Hemochromatosis 10/25/2014  . BP (high blood pressure) 10/21/2014  . CA of prostate ([place]) 10/21/2014  . Cancer of testicle ([place]) 10/21/2014  . Anemia 08/24/2014  . Dizziness 09/14/2013  . Difficulty in walking 09/14/2013  . 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 A-fib ([place]) 06/24/2013  . MI (mitral incompetence) 06/24/2013  . Absolute anemia 06/23/2013  . Arthritis, degenerative 06/23/2013  . Essential (primary) hypertension 06/23/2013    Past Surgical History:  Procedure Laterality Date  . ANTERIOR APPROACH HEMI HIP ARTHROPLASTY Left 05/28/2019   Procedure: ANTERIOR APPROACH HEMI HIP ARTHROPLASTY;  Surgeon: Hessie Knows, MD;  Location: [facility] ORS;  Service: Orthopedics;  Laterality: Left;  . APPENDECTOMY    . CHOLECYSTECTOMY    . HAND SURGERY Bilateral   . HERNIA REPAIR    . INNER EAR SURGERY    . KYPHOPLASTY N/A 12/14/2015   Procedure: KYPHOPLASTY  L2;  Surgeon: Hessie Knows, MD;  Location: [facility] ORS;  Service: Orthopedics;  Laterality: N/A;  . KYPHOPLASTY N/A 12/23/2019   Procedure: KYPHOPLASTY;  Surgeon: Hessie Knows, MD;  Location: [facility] ORS;  Service: Orthopedics;  Laterality: N/A;  . PROSTATE SURGERY    . SURGERY SCROTAL / TESTICULAR Right     Prior to Admission medications   Medication Sig Start Date End Date Taking? Authorizing Provider  acetaminophen (TYLENOL) 650 MG CR tablet Take 650 mg by mouth 2 (two) times daily as needed for pain.    [provider]  aspirin EC 81 MG tablet Take 81  mg by mouth every evening.     [provider]  Calcium-Magnesium-Zinc (CAL-MAG-ZINC PO) Take 1 tablet by mouth daily.    [provider]  Cholecalciferol (QC VITAMIN D3) 50 MCG (2000 UT) TABS Take by mouth.    [provider]  docusate sodium (COLACE) 100 MG capsule Take 100 mg by mouth 2 (two) times daily. With breakfast & with supper    [provider]  ELDERBERRY PO Take 2 tablets by mouth daily. Gummy    [provider]   HYDROcodone-acetaminophen (NORCO/VICODIN) 5-325 MG tablet Take 1 tablet by mouth at bedtime.  Patient not taking: Reported on 01/24/2020    [provider]  ibuprofen (ADVIL) 200 MG tablet Take 200 mg by mouth 2 (two) times daily as needed (pain.).     [provider]  Lido-Capsaicin-Men-Methyl Sal (MEDI-PATCH-LIDOCAINE EX) Apply 1 patch topically daily. Applied to back    [provider]  Multiple Vitamins-Minerals (ADULT GUMMY PO) Take 2 tablets by mouth daily.    [provider]  naproxen sodium (ALEVE) 220 MG tablet Take 440 mg by mouth at bedtime.     [provider]  Potassium 99 MG TABS Take 99 mg by mouth daily.     [provider]  Turmeric 500 MG TABS Take 500 mg by mouth at bedtime. w/Ginger    [provider]  vitamin B-12 (CYANOCOBALAMIN) 1000 MCG tablet Take 1,000 mcg by mouth daily.    [provider]    Allergies Levofloxacin  Family History  Problem Relation Age of Onset  . Hypertension Mother     Social History Social History   Tobacco Use  . Smoking status: Former Smoker    Packs/day: 1.00    Years: 25.00    Pack years: 25.00    Types: Cigarettes    Quit date: 03/22/1958    Years since quitting: 62.0  . Smokeless tobacco: Never Used  Vaping Use  . Vaping Use: Never used  Substance Use Topics  . Alcohol use: No  . Drug use: No    Review of Systems  Constitutional: Negative for fever. Respiratory: Negative for difficulty breathing. + cough. Gastrointestinal: Negative for vomiting or diarrhea.   Level 5 caveat:  Portions of the history and physical were unable to be obtained due to non verbal, weak  ____________________________________________   PHYSICAL EXAM:  VITAL SIGNS: ED Triage Vitals  Enc Vitals Group     BP 03/27/20 1503 (!) 170/94     Pulse Rate 03/27/20 1503 96     Resp 03/27/20 1503 18     Temp 03/27/20 1503 97.6 F (36.4 C)     Temp Source 03/27/20 1503 Oral      SpO2 03/27/20 1503 92 %     Weight 03/27/20 1506 138 lb 14.2 oz (63 kg)     Height 03/27/20 1506 5\' 9"  (1.753 m)     Head Circumference --      Peak Flow --      Pain Score --      Pain Loc --      Pain Edu? --      Excl. in [place]? --     Constitutional: Awake, not answering questions, makes eye contact, will follow simple instructions HEENT:      Head: Normocephalic and atraumatic.         Eyes: Conjunctivae are normal. Sclera is non-icteric.       Mouth/Throat: Mucous membranes are dry.  Neck: Supple with no signs of meningismus. Cardiovascular: Regular rate and rhythm. No murmurs, gallops, or rubs. 2+ symmetrical distal pulses are present in all extremities. No JVD. Respiratory: Normal respiratory effort. Lungs are clear to auscultation bilaterally. No wheezes, crackles, or rhonchi.  Gastrointestinal: Soft, patient grimaces in pain with palpation of the abdomen, non distended with positive bowel sounds. No rebound or guarding. Musculoskeletal: Trace pitting edema of the RLE, no erythema or warmth, strong distal pulses. Neurologic:  Face is symmetric. Eyes open spontaneously. Moving all extremities.  Skin: Skin is warm, dry and intact. No rash noted. Psychiatric: Mood and affect are normal. Speech and behavior are normal.  ____________________________________________   LABS (all labs ordered are listed, but only abnormal results are displayed)  Labs Reviewed  CBC WITH DIFFERENTIAL/PLATELET - Abnormal; Notable for the following components:      Result Value   WBC 11.0 (*)    RBC 3.38 (*)    Hemoglobin 10.3 (*)    HCT 32.0 (*)    RDW 21.9 (*)    Platelets 444 (*)    nRBC 0.3 (*)    Monocytes Absolute 1.6 (*)    All other components within normal limits  COMPREHENSIVE METABOLIC PANEL - Abnormal; Notable for the following components:   Potassium 3.4 (*)    Glucose, Bld 130 (*)    All other components within normal limits  URINALYSIS, COMPLETE (UACMP) WITH  MICROSCOPIC - Abnormal; Notable for the following components:   Color, Urine YELLOW (*)    APPearance CLEAR (*)    All other components within normal limits  TROPONIN I (HIGH SENSITIVITY) - Abnormal; Notable for the following components:   Troponin I (High Sensitivity) 18 (*)    All other components within normal limits  TROPONIN I (HIGH SENSITIVITY) - Abnormal; Notable for the following components:   Troponin I (High Sensitivity) 18 (*)    All other components within normal limits  RESP PANEL BY RT-PCR (FLU A&B, COVID) ARPGX2  TYPE AND SCREEN   ____________________________________________  EKG  ED ECG REPORT I, Rudene Re, the attending physician, personally viewed and interpreted this ECG.  Sinus rhythm, rate of 80, first-degree AV block, occasional PVCs, no ST elevations or depressions. Unchanged from prior ____________________________________________  RADIOLOGY  I have personally reviewed the images performed during this visit and I agree with the Radiologist's read.   Interpretation by Radiologist:  DG Chest 1 View  Result Date: 03/28/2020 CLINICAL DATA:  Altered mental status EXAM: CHEST  1 VIEW COMPARISON:  05/27/2019 FINDINGS: Heart is normal size. Diffuse interstitial prominence throughout the lungs, favor chronic lung disease. No effusions. Aortic atherosclerosis. No acute bony abnormality. IMPRESSION: Diffuse interstitial prominence throughout the lungs, favor chronic interstitial lung disease. Alternatively, this could reflect viral/atypical infection. Electronically Signed   By: Rolm Baptise M.D.   On: 03/28/2020 00:23   CT Head Wo Contrast  Result Date: 03/28/2020 CLINICAL DATA:  Mental status changes EXAM: CT HEAD WITHOUT CONTRAST TECHNIQUE: Contiguous axial images were obtained from the base of the skull through the vertex without intravenous contrast. COMPARISON:  None. 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 Chronic bilateral basal  ganglia lacunar infarcts. There is atrophy and chronic small vessel disease changes. No acute intracranial abnormality. Specifically, no hemorrhage, hydrocephalus, mass lesion, acute infarction, or significant intracranial injury. Vascular: No hyperdense vessel or unexpected calcification. Skull: No acute calvarial abnormality. Sinuses/Orbits: Air-fluid level in the right maxillary sinus. 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 Atrophy, chronic microvascular disease. No acute intracranial abnormality. Electronically Signed   By: Rolm Baptise M.D.   On: 03/28/2020 00:21   CT Angio Chest PE W and/or Wo Contrast  Result Date: 03/28/2020 CLINICAL DATA:  Lethargy, possible pulmonary embolism, abdominal pain EXAM: CT ANGIOGRAPHY CHEST CT ABDOMEN AND PELVIS WITH CONTRAST TECHNIQUE: Multidetector CT imaging of the chest was performed using the standard protocol during bolus administration of intravenous contrast. Multiplanar CT image reconstructions and MIPs were obtained to evaluate the vascular anatomy. Multidetector CT imaging of the abdomen and pelvis was performed using the standard protocol during bolus administration of intravenous contrast. CONTRAST:  16mL OMNIPAQUE IOHEXOL 350 MG/ML SOLN COMPARISON:  MR lumbar spine 12/20/2019, fluoroscopy lumbar spine 12/23/2019, 12/14/2015, CT abdomen pelvis 12/28/1998 (report only) FINDINGS: CTA CHEST FINDINGS Cardiovascular: Satisfactory opacification of pulmonary arteries. No large central or lobar pulmonary arterial filling defects are identified. Respiratory motion may limit detection of smaller, subsegmental pulmonary emboli. Borderline dilatation of the the pulmonary trunk. Cardiac size is top normal. Three-vessel coronary artery atherosclerosis is noted. Trace pericardial fluid including fluid within the pericardial recesses. Ascending thoracic aortic dilatation to 4.3 cm. Atherosclerotic plaque throughout the  thoracic aorta. No acute luminal abnormality. No focal periaortic stranding or hemorrhage. Cerebral region of the brachiocephalic and left common carotid arteries. The left vertebral artery arises directly from the aortic arch between this shared origin and the left subclavian artery. Proximal great vessels are otherwise unremarkable. Mediastinum/Nodes: Fluid in the pericardial recesses. No mediastinal gas. Normal thyroid gland and thoracic inlet. No acute abnormality of the esophagus. Secretions noted in the trachea, posterior bowing likely related to imaging during exhalation. No worrisome mediastinal, hilar or axillary adenopathy. Shotty low-attenuation subcentimeter mediastinal and hilar lymph nodes are favored to be edematous or reactive. Lungs/Pleura: Diffuse airways thickening and scattered secretions. Diffuse interlobular septal thickening, pulmonary vascular redistribution and hazy ground-glass opacity particularly within the more dependent portions of the lungs are findings most compatible with interstitial and early alveolar edema. Small to moderate bilateral pleural effusions are present, right slightly greater than left. Adjacent passive atelectatic changes. No pneumothorax. Musculoskeletal: T12 compression deformity with vertebroplasty changes, with 20% residual height loss not significantly changed from fluoroscopic images at the time of procedure 12/23/2019. No new acute osseous abnormalities or suspicious lytic or blastic lesions are seen. No worrisome chest wall masses or lesions. Review of the MIP images confirms the above findings. CT ABDOMEN and PELVIS FINDINGS Hepatobiliary: Few punctate parenchymal calcifications within the liver, largest in segment 4) 5/21) possibly vascular versus prior granulomatous disease. No worrisome focal liver lesions. Smooth liver surface contour. Normal hepatic attenuation. Prior cholecystectomy. Marked distension of the intra and extrahepatic biliary tree with the  common bile duct measuring up to 15 mm in diameter, greater than expected for typical post cholecystectomy reservoir effect and senescent change. No visible intraductal gallstones. Pancreas: No pancreatic ductal dilatation or surrounding inflammatory changes. Spleen: Heterogeneous enhancement the spleen is nonspecific and possibly related to arterial phase of contrast imaging though a slightly more wedge-shaped area of hypoattenuation is seen in the posterior spleen (5/20) which could reflect an early splenic infarct. Adrenals/Urinary Tract: No concerning adrenal nodules or masses. There is a hyperdense cystic appearing lesion measuring up to 2.8 cm in the lower pole left kidney though demonstrate some conspicuous increase in attenuation of approximately 15 Hounsfield units on the delayed phase imaging (5/38, 10/20) more simple appearing cysts are seen elsewhere in both kidneys with a slightly larger minimally  complex cyst with thin internal septations arising from the upper pole right kidney measuring up to 2 cm in size (5/38) no other concerning renal lesions. Kidneys enhance and excrete symmetrically. No urolithiasis or hydronephrosis. Excreted contrast material is noted within the collecting system from separate contrast bolus administration. Urinary bladder is otherwise unremarkable. Stomach/Bowel: Sliding-type hiatal hernia. Stomach and duodenum are unremarkable. No small bowel thickening or dilatation. The appendix is surgically absent. No colonic dilatation or wall thickening. Scattered colonic diverticula without focal inflammation to suggest diverticulitis. Some mild segmental thickening of the sigmoid in a region of numerous colonic diverticula without acute pericolonic or focal diverticular inflammation could reflect sequela of prior inflammation. Mild circumferential anorectal thickening with perirectal fat stranding is nonspecific though could correlate for features of proctitis. Vascular/Lymphatic:  Atherosclerotic calcifications within the abdominal aorta and branch vessels. No aneurysm or ectasia. No enlarged abdominopelvic lymph nodes. Reproductive: Prostate appears surgically absent. Other: Perirectal hazy stranding, as above. Postsurgical changes from prior low vertical midline incision. Mild body wall edema. No abdominopelvic free air or fluid. Musculoskeletal: Remote compression deformity L2 with evidence of prior vertebroplasty in slight extravasation of cement into the L3-4 disc space. Up to 70% residual height loss centrally. Stepwise likely degenerative retrolisthesis L2-L5. Additional transitional lumbosacral anatomy, with what is likely a rudimentary disc between the S1 and S2 levels. The osseous structures appear diffusely demineralized which may limit detection of small or nondisplaced fractures. No acute osseous abnormality or suspicious osseous lesion. Prior left hip arthroplasty with streak artifact. Hardware is in expected alignment without complication. Review of the MIP images confirms the above findings. IMPRESSION: 1. No evidence of large central or lobar pulmonary arterial filling defects to suggest pulmonary embolism. Respiratory motion may limit detection of smaller, subsegmental pulmonary emboli. 2. Features of congestive heart failure with interstitial and early alveolar edema, small to moderate bilateral pleural effusions. 3. Ascending thoracic aortic dilatation to 4.3 cm. No evidence of dissection. Guidelines recommend annual imaging followup by CTA or MRA. This recommendation follows 2010 ACCF/AHA/AATS/ACR/ASA/SCA/SCAI/SIR/STS/SVM Guidelines for the Diagnosis and Management of Patients with Thoracic Aortic Disease. Circulation. 2010; [id number]. Aortic aneurysm NOS (ICD10-I71.9) 4. Heterogeneous enhancement the spleen is nonspecific and possibly related to arterial phase of contrast imaging though a slightly more wedge-shaped area of hypoattenuation is seen in the posterior  spleen could reflect an early splenic infarct. 5. Mild circumferential anorectal thickening with perirectal fat stranding is nonspecific though could correlate for features of proctitis, and consider direct visualization. 6. Segmental thickening of the mid sigmoid colon in a region of numerous colonic diverticula the albeit without acute inflammation at this time, may reflect sequela of prior diverticular inflammation. 7. Hyperdense cystic appearing lesion measuring up to 2.8 cm in the lower pole left kidney though demonstrate some conspicuous increase in attenuation of approximately 15 Hounsfield units on the delayed phase imaging. Recommend further evaluation with nonemergent renal ultrasound for further evaluation. 8. Sliding-type hiatal hernia. 9. Compression deformities and vertebroplasty changes T12 and L2. Electronically Signed   By: Lovena Le M.D.   On: 03/28/2020 05:03   CT ABDOMEN PELVIS W CONTRAST  Result Date: 03/28/2020 CLINICAL DATA:  Lethargy, possible pulmonary embolism, abdominal pain EXAM: CT ANGIOGRAPHY CHEST CT ABDOMEN AND PELVIS WITH CONTRAST TECHNIQUE: Multidetector CT imaging of the chest was performed using the standard protocol during bolus administration of intravenous contrast. Multiplanar CT image reconstructions and MIPs were obtained to evaluate the vascular anatomy. Multidetector CT imaging of the abdomen and pelvis was performed using  the standard protocol during bolus administration of intravenous contrast. CONTRAST:  140mL OMNIPAQUE IOHEXOL 350 MG/ML SOLN COMPARISON:  MR lumbar spine 12/20/2019, fluoroscopy lumbar spine 12/23/2019, 12/14/2015, CT abdomen pelvis 12/28/1998 (report only) FINDINGS: CTA CHEST FINDINGS Cardiovascular: Satisfactory opacification of pulmonary arteries. No large central or lobar pulmonary arterial filling defects are identified. Respiratory motion may limit detection of smaller, subsegmental pulmonary emboli. Borderline dilatation of the the  pulmonary trunk. Cardiac size is top normal. Three-vessel coronary artery atherosclerosis is noted. Trace pericardial fluid including fluid within the pericardial recesses. Ascending thoracic aortic dilatation to 4.3 cm. Atherosclerotic plaque throughout the thoracic aorta. No acute luminal abnormality. No focal periaortic stranding or hemorrhage. Cerebral region of the brachiocephalic and left common carotid arteries. The left vertebral artery arises directly from the aortic arch between this shared origin and the left subclavian artery. Proximal great vessels are otherwise unremarkable. Mediastinum/Nodes: Fluid in the pericardial recesses. No mediastinal gas. Normal thyroid gland and thoracic inlet. No acute abnormality of the esophagus. Secretions noted in the trachea, posterior bowing likely related to imaging during exhalation. No worrisome mediastinal, hilar or axillary adenopathy. Shotty low-attenuation subcentimeter mediastinal and hilar lymph nodes are favored to be edematous or reactive. Lungs/Pleura: Diffuse airways thickening and scattered secretions. Diffuse interlobular septal thickening, pulmonary vascular redistribution and hazy ground-glass opacity particularly within the more dependent portions of the lungs are findings most compatible with interstitial and early alveolar edema. Small to moderate bilateral pleural effusions are present, right slightly greater than left. Adjacent passive atelectatic changes. No pneumothorax. Musculoskeletal: T12 compression deformity with vertebroplasty changes, with 20% residual height loss not significantly changed from fluoroscopic images at the time of procedure 12/23/2019. No new acute osseous abnormalities or suspicious lytic or blastic lesions are seen. No worrisome chest wall masses or lesions. Review of the MIP images confirms the above findings. CT ABDOMEN and PELVIS FINDINGS Hepatobiliary: Few punctate parenchymal calcifications within the liver, largest  in segment 4) 5/21) possibly vascular versus prior granulomatous disease. No worrisome focal liver lesions. Smooth liver surface contour. Normal hepatic attenuation. Prior cholecystectomy. Marked distension of the intra and extrahepatic biliary tree with the common bile duct measuring up to 15 mm in diameter, greater than expected for typical post cholecystectomy reservoir effect and senescent change. No visible intraductal gallstones. Pancreas: No pancreatic ductal dilatation or surrounding inflammatory changes. Spleen: Heterogeneous enhancement the spleen is nonspecific and possibly related to arterial phase of contrast imaging though a slightly more wedge-shaped area of hypoattenuation is seen in the posterior spleen (5/20) which could reflect an early splenic infarct. Adrenals/Urinary Tract: No concerning adrenal nodules or masses. There is a hyperdense cystic appearing lesion measuring up to 2.8 cm in the lower pole left kidney though demonstrate some conspicuous increase in attenuation of approximately 15 Hounsfield units on the delayed phase imaging (5/38, 10/20) more simple appearing cysts are seen elsewhere in both kidneys with a slightly larger minimally complex cyst with thin internal septations arising from the upper pole right kidney measuring up to 2 cm in size (5/38) no other concerning renal lesions. Kidneys enhance and excrete symmetrically. No urolithiasis or hydronephrosis. Excreted contrast material is noted within the collecting system from separate contrast bolus administration. Urinary bladder is otherwise unremarkable. Stomach/Bowel: Sliding-type hiatal hernia. Stomach and duodenum are unremarkable. No small bowel thickening or dilatation. The appendix is surgically absent. No colonic dilatation or wall thickening. Scattered colonic diverticula without focal inflammation to suggest diverticulitis. Some mild segmental thickening of the sigmoid in a region of numerous  colonic diverticula  without acute pericolonic or focal diverticular inflammation could reflect sequela of prior inflammation. Mild circumferential anorectal thickening with perirectal fat stranding is nonspecific though could correlate for features of proctitis. Vascular/Lymphatic: Atherosclerotic calcifications within the abdominal aorta and branch vessels. No aneurysm or ectasia. No enlarged abdominopelvic lymph nodes. Reproductive: Prostate appears surgically absent. Other: Perirectal hazy stranding, as above. Postsurgical changes from prior low vertical midline incision. Mild body wall edema. No abdominopelvic free air or fluid. Musculoskeletal: Remote compression deformity L2 with evidence of prior vertebroplasty in slight extravasation of cement into the L3-4 disc space. Up to 70% residual height loss centrally. Stepwise likely degenerative retrolisthesis L2-L5. Additional transitional lumbosacral anatomy, with what is likely a rudimentary disc between the S1 and S2 levels. The osseous structures appear diffusely demineralized which may limit detection of small or nondisplaced fractures. No acute osseous abnormality or suspicious osseous lesion. Prior left hip arthroplasty with streak artifact. Hardware is in expected alignment without complication. Review of the MIP images confirms the above findings. IMPRESSION: 1. No evidence of large central or lobar pulmonary arterial filling defects to suggest pulmonary embolism. Respiratory motion may limit detection of smaller, subsegmental pulmonary emboli. 2. Features of congestive heart failure with interstitial and early alveolar edema, small to moderate bilateral pleural effusions. 3. Ascending thoracic aortic dilatation to 4.3 cm. No evidence of dissection. Guidelines recommend annual imaging followup by CTA or MRA. This recommendation follows 2010 ACCF/AHA/AATS/ACR/ASA/SCA/SCAI/SIR/STS/SVM Guidelines for the Diagnosis and Management of Patients with Thoracic Aortic Disease.  Circulation. 2010; [id number]. Aortic aneurysm NOS (ICD10-I71.9) 4. Heterogeneous enhancement the spleen is nonspecific and possibly related to arterial phase of contrast imaging though a slightly more wedge-shaped area of hypoattenuation is seen in the posterior spleen could reflect an early splenic infarct. 5. Mild circumferential anorectal thickening with perirectal fat stranding is nonspecific though could correlate for features of proctitis, and consider direct visualization. 6. Segmental thickening of the mid sigmoid colon in a region of numerous colonic diverticula the albeit without acute inflammation at this time, may reflect sequela of prior diverticular inflammation. 7. Hyperdense cystic appearing lesion measuring up to 2.8 cm in the lower pole left kidney though demonstrate some conspicuous increase in attenuation of approximately 15 Hounsfield units on the delayed phase imaging. Recommend further evaluation with nonemergent renal ultrasound for further evaluation. 8. Sliding-type hiatal hernia. 9. Compression deformities and vertebroplasty changes T12 and L2. Electronically Signed   By: Lovena Le M.D.   On: 03/28/2020 05:03     ____________________________________________   PROCEDURES  Procedure(s) performed:yes .1-3 Lead EKG Interpretation Performed by: Rudene Re, MD Authorized by: Rudene Re, MD     Interpretation: abnormal     ECG rate assessment: normal     Rhythm: sinus rhythm     Ectopy: PVCs     Critical Care performed:  None ____________________________________________   INITIAL IMPRESSION / ASSESSMENT AND PLAN / ED COURSE  87 y.o. male with a history of anemia, atrial fibrillation on aspirin, hemochromatosis, hypertension who presents from the cancer center for generalized weakness, now bedbound for 3 weeks and nonverbal. Grossly neurologically intact with no focal neuro deficits, vitals WNL, looks dry on exam, asymmetric pitting edema of the  RLE, not clear if new or chronic, patient also grimaces with palpation of the abdomen. Has a mild cough but no respiratory distress and sating 98% on RA. EKG unchanged from baseline with 2 Hs-trop of 18. Labs showing mild leukocytosis with no left shift.  Patient's hemoglobin is good  at 10.3.  Stable thrombocytosis.  No significant electrolyte derangements or AKI.  Chest x-ray visualized by me consistent with possible multifocal pneumonia, confirmed by radiology.  Will swab patient for COVID and flu.  Head CT visualized by me with no acute findings, confirmed by radiology.  We will get a UA to rule out UTI.  Since patient seems to have pain in his abdomen we will get a CT abdomen pelvis.  We will also include a CT of the chest for better evaluation of pneumonia and to rule out a PE in the setting of 3 weeks of bedbound.  Will give IV fluids since patient looks dry on exam.  History gathered from patient's daughter who is at bedside, plan discussed with her who is in agreement.  Patient's old medical records reviewed.  Patient placed on telemetry for close monitoring of hemodynamic status.  _________________________ 5:38 AM on 03/28/2020 -----------------------------------------  COVID-negative.  UA negative for UTI.  CT with several findings including new onset of CHF for which 20 mg of IV Lasix was ordered.  Acute proctitis for which Zosyn has been ordered. Questionable splenic infarct. Incidental findings of cystic lesion in the kidney and ascending thoracic aortic aneurysm with no dissection. Discussed all these finding with patient's daughter who is at bedside. Will discuss with the hospitalist for admission.    _____________________________________________ Please note:  Patient was evaluated in Emergency Department today for the symptoms described in the history of present illness. Patient was evaluated in the context of the global COVID-19 pandemic, which necessitated consideration that the patient  might be at risk for infection with the SARS-CoV-2 virus that causes COVID-19. Institutional protocols and algorithms that pertain to the evaluation of patients at risk for COVID-19 are in a state of rapid change based on information released by regulatory bodies including the CDC and federal and state organizations. These policies and algorithms were followed during the patient's care in the ED.  Some ED evaluations and interventions may be delayed as a result of limited staffing during the pandemic.   [place] Controlled Substance Database was reviewed by me. ____________________________________________   FINAL CLINICAL IMPRESSION(S) / ED DIAGNOSES   Final diagnoses:  Failure to thrive in adult  Proctitis  New onset of congestive heart failure ([place])  Splenic infarct  Thoracic aortic aneurysm without rupture ([place])  Renal lesion      NEW MEDICATIONS STARTED DURING THIS VISIT:  ED Discharge Orders    None       Note:  This document was prepared using Dragon voice recognition software and may include unintentional dictation errors.    Rudene Re, MD 03/28/20 [phone]

## 2020-03-29 DIAGNOSIS — R531 Weakness: Secondary | ICD-10-CM | POA: Diagnosis not present

## 2020-03-29 LAB — CBC
HCT: 28.9 % — ABNORMAL LOW (ref 39.0–52.0)
Hemoglobin: 9.4 g/dL — ABNORMAL LOW (ref 13.0–17.0)
MCH: 30.5 pg (ref 26.0–34.0)
MCHC: 32.5 g/dL (ref 30.0–36.0)
MCV: 93.8 fL (ref 80.0–100.0)
Platelets: 383 10*3/uL (ref 150–400)
RBC: 3.08 MIL/uL — ABNORMAL LOW (ref 4.22–5.81)
RDW: 22.3 % — ABNORMAL HIGH (ref 11.5–15.5)
WBC: 9.3 10*3/uL (ref 4.0–10.5)
nRBC: 0 % (ref 0.0–0.2)

## 2020-03-29 LAB — MRSA PCR SCREENING: MRSA by PCR: NEGATIVE

## 2020-03-29 LAB — BASIC METABOLIC PANEL
Anion gap: 10 (ref 5–15)
BUN: 23 mg/dL (ref 8–23)
CO2: 26 mmol/L (ref 22–32)
Calcium: 8.9 mg/dL (ref 8.9–10.3)
Chloride: 102 mmol/L (ref 98–111)
Creatinine, Ser: 0.98 mg/dL (ref 0.61–1.24)
GFR, Estimated: >60 mL/min (ref >60)
Glucose, Bld: 120 mg/dL — ABNORMAL HIGH (ref 70–99)
Potassium: 3.5 mmol/L (ref 3.5–5.1)
Sodium: 138 mmol/L (ref 135–145)

## 2020-03-29 MED ORDER — LIDOCAINE 5 % EX PTCH
1.0000 | MEDICATED_PATCH | Freq: Every day | CUTANEOUS | Status: DC | PRN
  Administered 2020-03-29 – 2020-04-01 (×2): 1 via TRANSDERMAL
  Filled 2020-03-29 (×4): qty 1

## 2020-03-29 NOTE — Evaluation (Addendum)
Occupational Therapy Evaluation Patient Details Name: Gerald [name] MRN: [id number] DOB: July 03, 1920 Today's Date: 03/29/2020    History of Present Illness Gerald [name] is a [id number] who comes to [facility] from cancer center on 1/10 due to recent increased lethargy and low Hgb. Per DTR pt was treated for a cold around Christmas c ABX, since struggled with AMB, been in bed for 3 weeks.  PMH: anemia, AF on aspirin, hemochromatosis, HTN, Left anterior THA March '21 s/po fall, multiple lumbar compression fractures s/p lordoplasty x1. 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 Mild associated edema without mass effect. Abnormal flow related signal within the visualized right vertebral artery, concerning for occlusion or significant stenosis. Recommend CTA to further evaluate. Remote right frontal lobe infarct and dilated perivascular spaces versus remote lacunar infarcts in bilateral basal ganglia." Neurology note from 1/11 also mentions "May have underlying Parkinsonism given cog wheeling and tremor seen on examination. Family relates that prior to March, he had developed a mild shuffling gait."   Clinical Impression   Pt seen for OT evaluation this date in setting of acute hospitalization d/t general weakness and found to have small acute infarct. Pt has 2 daughters present throughout session who report that pt has been steadily declining with mobility since fall in March, but has become significantly worse in past 3 weeks since he was treated for a cold in December. Pt's family report that earlier in 2021, pt was able to walk with AD and perform most basic self care with modified independence. After the fall, report he was requiring assist with AD for short HH distances and could perform most seated self care with SETUP. However, they report gradually over 2021, he has required more and more assistance with basic self care and has  essentially been in bed for past 3 weeks. Pt presents this date with significant debilitation. He requires TOTAL A +2 for sup<>sit, MAX A +2 for STS with arm in arm technique, MOD/MAX A +2 for lateral rolling to complete bed level LB ADLs such as peri care, essentially MAX A for anterior peri care and TOTAL A for posterior per care at bed level. Pt is difficult to rouse initially, requires increased processing time, but is appropriate with simple one step commands throughout session. Will continue to follow acutely and should family choose to persue therapy, anticipate he could benefit from [facility].    Follow Up Recommendations  Home health OT;Supervision/Assistance - 24 hour    Equipment Recommendations  Other (comment) (mechanical lift)    Recommendations for Other Services       Precautions / Restrictions Precautions Precautions: Fall Restrictions Weight Bearing Restrictions: No      Mobility Bed Mobility Overal bed mobility: Needs Assistance Bed Mobility: Supine to Sit;Sit to Supine     Supine to sit: Total assist;+2 for physical assistance Sit to supine: Total assist;+2 for physical assistance        Transfers Overall transfer level: Needs assistance   Transfers: Sit to/from Stand Sit to Stand: Max assist;+2 physical assistance         General transfer comment: pt does not fully clear bed. PT facilitates arm in arm MAX STS transfer while OT attempts to help pt extend hips, but he is unable to come to full stand and is noted to have hips in flexion with posterior aspects of LEs bracing on bed.    Balance Overall balance assessment: Needs assistance Sitting-balance  support: Bilateral upper extremity supported;Feet supported Sitting balance-Leahy Scale: Poor Sitting balance - Comments: requires at least MOD A to mantain, when hands are placed for b/l support, he is able to have ~10 second bouts of static sitting with CGA/MIN A.   Standing balance support: Bilateral upper  extremity supported Standing balance-Leahy Scale: Zero Standing balance comment: MAX A +2 to maintain partial static stand. Arm in arm tehcnique for support and posterior support to extend hips, bring pelvis forward into full stand.                           ADL either performed or assessed with clinical judgement   ADL Overall ADL's : Needs assistance/impaired Eating/Feeding: Minimal assistance;Moderate assistance;Bed level Eating/Feeding Details (indicate cue type and reason): high fowlers Grooming: Wash/dry face;Oral care;Moderate assistance;Maximal assistance;Sitting;Bed level Grooming Details (indicate cue type and reason): attempt to engage pt in washing face while seated EOB, pt requires MOD A to sustain static sitting balance and is only minimally able to complete face washing task against gravity in seated position d/t weakness. Pt requires MOD/MAX A for oral care in bed at high fowler's position. Upper Body Bathing: Moderate assistance;Maximal assistance;Bed level   Lower Body Bathing: Maximal assistance;Total assistance;Bed level Lower Body Bathing Details (indicate cue type and reason): rolling Upper Body Dressing : Moderate assistance;Bed level   Lower Body Dressing: Maximal assistance;Total assistance;Bed level     Toilet Transfer Details (indicate cue type and reason): unable, MAX to TOTAL A just for sit to stand Toileting- Clothing Manipulation and Hygiene: Total assistance;Bed level Toileting - Clothing Manipulation Details (indicate cue type and reason): rolling, requires TOTAL A for posterior peri care             Vision Patient Visual Report: No change from baseline       Perception     Praxis      Pertinent Vitals/Pain Pain Assessment: Faces Faces Pain Scale: Hurts even more Pain Location: chronic lumbar pain, history of compression fractures, kyphoplasty x1 Pain Descriptors / Indicators: Discomfort;Grimacing;Guarding Pain Intervention(s):  Limited activity within patient's tolerance;Monitored during session;Repositioned     Hand Dominance Right   Extremity/Trunk Assessment Upper Extremity Assessment Upper Extremity Assessment: Generalized weakness (shld ROM limited, MMT 3-/5. Elbow, wrist and hand ROM WFL, MMT grossly 3+/5)   Lower Extremity Assessment Lower Extremity Assessment: Generalized weakness   Cervical / Trunk Assessment Cervical / Trunk Assessment: Kyphotic   Communication Communication Communication: No difficulties   Cognition Arousal/Alertness: Awake/alert Behavior During Therapy: WFL for tasks assessed/performed Overall Cognitive Status: Impaired/Different from baseline                                 General Comments: Pt's daughters present throughout session and report that at baseline, pt is able to communicate effectively, hold a conversation and knows himself/his family. States that he has become increasingly more confused and has been talking less in last 2-3 weeks. During session, when given increased processing time and simple one-step commands, he follows ~90% of the time and is primarily appropriate. Some fatigue/eye closing intermittently and pt is not oriented to situation, but he is primarily appropriate for tasks assessed.   General Comments       Exercises Other Exercises Other Exercises: OT facilitates ed with pt and pt's daughters re: lateral rolling bathing, toileting, and bed change technique. OT educates family and pt re: importance  of OOB Activity and allowing pt to try for himself as much as he can tolerate if they're thinking [facility] therapy f/u is the direction they'd like to pursue as they'd mentioned. Pt's daughters with good understanding, pt with some MIN/MOD visual attention and nodding during discussion, but difficult to ascertain his understanding.   Shoulder Instructions      Home Living Family/patient expects to be discharged to:: Private residence Living  Arrangements: Children Available Help at Discharge: Family Type of Home: House Home Access: Stairs to enter [employer] of Steps: 4 Entrance Stairs-Rails: None Home Layout: Multi-level;Bed/bath upstairs;Able to live on main level with bedroom/bathroom               Home Equipment: Gilford Rile - 2 wheels;Shower seat;Wheelchair - [employer];Other (comment);Hospital bed   Additional Comments: adjustable bed; lift chair; STS lift?      Prior Functioning/Environment Level of Independence: Needs assistance  Gait / Transfers Assistance Needed: 1 month ago was able to AMB limited household distances with supervision and walker, was getting HHPT ADL's / Homemaking Assistance Needed: Has been total assist for past 3 weeks, mostly in bed; Before December of this year (2021), was able to participate in seated UB ADLs including self feeding and grooming, and before October of 2021, he was performing most dressing tasks himself. Family report gradual decline in INDEP since fall in March 2021.            OT Problem List: Decreased strength;Impaired balance (sitting and/or standing);Decreased range of motion;Decreased activity tolerance;Decreased knowledge of use of DME or AE;Impaired vision/perception      OT Treatment/Interventions: Self-care/ADL training;DME and/or AE instruction;Therapeutic activities;Balance training;Therapeutic exercise;Energy conservation;Patient/family education    OT Goals(Current goals can be found in the care plan section) Acute Rehab OT Goals Patient Stated Goal: regain ability to help family members with bed mobility and transfers, maybe take a few steps OT Goal Formulation: With family Time For Goal Achievement: 04/12/20 Potential to Achieve Goals: Fair ADL Goals Pt Will Perform Eating: sitting;with min assist (supported sitting) Pt Will Perform Grooming: with min guard assist;with min assist;sitting (supported sitting) Pt Will Perform Upper  Body Dressing: with min assist;with mod assist;sitting (supported sitting)  OT Frequency: Min 1X/week   Barriers to D/C:            Co-evaluation   Reason for Co-Treatment: Complexity of the patient's impairments (multi-system involvement) PT goals addressed during session: Mobility/safety with mobility;Balance OT goals addressed during session: ADL's and self-care;Proper use of Adaptive equipment and DME      AM-PAC OT "6 Clicks" Daily Activity     Outcome Measure Help from another person eating meals?: A Lot Help from another person taking care of personal grooming?: A Lot Help from another person toileting, which includes using toliet, bedpan, or urinal?: A Lot Help from another person bathing (including washing, rinsing, drying)?: Total Help from another person to put on and taking off regular upper body clothing?: A Lot Help from another person to put on and taking off regular lower body clothing?: Total 6 Click Score: 10   End of Session Nurse Communication: Mobility status;Other (comment) (notified that pt with small smear of BM and bed changed, engaged in peri care, but requires TOTAL A.)  Activity Tolerance: Patient tolerated treatment well Patient left: in bed;with call bell/phone within reach;with bed alarm set;with family/visitor present  OT Visit Diagnosis: Unsteadiness on feet (R26.81);Muscle weakness (generalized) (M62.81)  Time: [id number] OT Time Calculation (min): 72 min Charges:  OT General Charges $OT Visit: 1 Visit OT Evaluation $OT Eval Moderate Complexity: 1 Mod OT Treatments $Self Care/Home Management : 23-37 mins $Therapeutic Activity: 8-22 mins  Gerrianne Scale, MS, OTR/L ascom [phone] 03/29/20, 3:47 PM

## 2020-03-29 NOTE — Progress Notes (Addendum)
[facility]) Hospital Liaison RN note:  This patient is enrolled in our palliative services in the community. [facility] Liaison will follow for any discharge planning needs and to coordinate continuation of palliative care. Gerald [name], TOC is aware.  Thank you.  Zandra Abts, RN [facility] Liaison [phone]

## 2020-03-29 NOTE — Progress Notes (Addendum)
PROGRESS NOTE    ARMISTEAD HUNTING  WYO:[id number] DOB: 11/04/1920 DOA: 03/28/2020 PCP: Jerl Mina, MD   Brief Narrative:  This 87 yrs old male with PMH significant for HTN, myelodysplastic syndrome, moderate mitral regurgitation, chronic anemia, remote history of prostate or testicular cancer, paroxysmal A. fib who presents with generalized weakness and altered mental status.  History is obtained from daughter since the patient was confused.  Family reports patient had hip replaced little under a year ago and since then family noticed gradual decline.  Patient has been less mobile with less energy and cognitive decline.  He was recently treated for respiratory infection by his primary care physician with antibiotics 3 weeks ago.  Patient has worsening mental status after he completed antibiotics.  Patient was sent from oncologist office for altered mental status.Chest x-ray shows possible pneumonia so patient was started on Zosyn.    Assessment & Plan:   Active Problems:   Anemia   A-fib (HCC)   Essential (primary) hypertension   Hemochromatosis   MDS (myelodysplastic syndrome), low grade (HCC)   Acute proctitis   Acute encephalopathy   Pressure injury of sacral region, stage 2 (HCC)   CVA (cerebral vascular accident) (HCC)   Acute encephalopathy could be secondary to acute stroke> improving Etiology unclear at admission. CT Head w/o acute findings. May be infection given mild leukocytosis but no localizing symptoms.  Recent URI but that appears mostly resolved.  CT Abdomen shows possible proctitis maybe 2/2 chronic constipation though report of stooling regularly as of late.  Urinalysis not suggestive of infection. CT does show some signs of fluid overload and breathing comfortably on room air.  Covid negative. Normal glucose, no significant electrolyte abnormalities. No signs hepatic dysfunction or uremia.  Not on meds associated with this. Could represent end of life  condition in this very elderly patient. Full code for now but daughter will discuss w/ other family members, she does think at this point patient would probably want to be DNR MRI Brain: Small acute or early subacute infarct in the right upper pons. Mild associated edema without mass effect. MRSA nares negative, will continue zosyn for now, presume would treat acute proctatitis.  Follow blood and urine cultures, baseline procalcitonin normal.  Likely stop abx in next 24-48 hours Lactic acid 1.5 - slp swallow eval, dysphagia 2 diet to start.  Interstitial edema on CT of chest: Patient does have history of moderate mitral regurg. Seen on CTA. No PE. ED gave lasix x1.  Patient appears euvolemic. Echocardiogram shows inferior hypokinesis, regional wall motion abnormalities EF 50 to 55%.   Myelodysplastic syndrome Chronic anemia Mild per onc. Here hgb is actually higher than baseline, perhaps 2/2 mild dehydration - f/u smear  Hypokalemia >>> resolved Mild, 3.4  Atrial fibrillation, paroxysmal. Not anticoagualted due to fall risk and age.  Hold home aspirin, benefit at this point doubtful  Stage 2 pressure ulcer - nursing wound care ordered  splenic infarct? possible splenic infarct seen on CTA. No report of abdominal pain.  - will monitor for now  Proctitis? Does have hx constipation. Doubtful would be cause of encephalopathy if present Continue zosyn for now. - consider GI consult  Cystic lesion in kidney - outpt renal u/s, consider  HTN: Blood pressure remains normal Continue to monitor blood pressure off medication  History constipation - cont home colace   DVT prophylaxis: Lovenox Code Status: Full code Family Communication: Daughters were at bedside  Disposition Plan:   Status is: Inpatient  Remains inpatient appropriate because:Inpatient level of care appropriate due to severity of illness   Dispo: The patient is from: Home               Anticipated d/c is to: Home              Anticipated d/c date is: > 3 days              Patient currently is not medically stable to d/c.  Consultants:   None  Procedures:  Antimicrobials:  Anti-infectives (From admission, onward)   Start     Dose/Rate Route Frequency Ordered Stop   03/28/20 1400  piperacillin-tazobactam (ZOSYN) IVPB 3.375 g        3.375 g 12.5 mL/hr over 240 Minutes Intravenous Every 8 hours 03/28/20 0858     03/28/20 0530  piperacillin-tazobactam (ZOSYN) IVPB 3.375 g        3.375 g 100 mL/hr over 30 Minutes Intravenous  Once 03/28/20 0517 03/28/20 [id number]      Subjective: Patient was seen and examined at bedside.  Overnight events noted.  Patient seems alert and oriented x1.   Family states patient is at baseline mental status now.  Objective: Vitals:   03/29/20 0600 03/29/20 0913 03/29/20 1114 03/29/20 1547  BP: (!) 156/93 (!) 178/84 (!) 159/78 131/65  Pulse: 83 78 80 71  Resp: 16 17 17 16   Temp:  97.9 F (36.6 C) 98 F (36.7 C) 98.3 F (36.8 C)  TempSrc:  Oral Oral   SpO2: 94% 99% 99% 95%  Weight:      Height:        Intake/Output Summary (Last 24 hours) at 03/29/2020 1709 Last data filed at 03/29/2020 1547 Gross per 24 hour  Intake 334.15 ml  Output 1600 ml  Net -1265.85 ml   Filed Weights   03/27/20 1506  Weight: 63 kg    Examination:  General exam: Appears calm and comfortable, not in any acute distress. Respiratory system: Clear to auscultation. Respiratory effort normal. Cardiovascular system: S1 & S2 heard, RRR. No JVD, murmurs, rubs, gallops or clicks. No pedal edema. Gastrointestinal system: Abdomen is nondistended, soft and nontender. No organomegaly or masses felt. Normal bowel sounds heard. Central nervous system: Alert and oriented. No focal neurological deficits. Extremities: Symmetric 5 x 5 power.  No edema, no cyanosis, no clubbing. Skin: No rashes, lesions or ulcers Psychiatry: Judgement and insight appear normal. Mood &  affect appropriate.     Data Reviewed: I have personally reviewed following labs and imaging studies  CBC: Recent Labs  Lab 03/27/20 1514 03/29/20 0609  WBC 11.0* 9.3  NEUTROABS 7.6  --   HGB 10.3* 9.4*  HCT 32.0* 28.9*  MCV 94.7 93.8  PLT 444* 863   Basic Metabolic Panel: Recent Labs  Lab 03/27/20 1514 03/28/20 0914 03/29/20 0609  NA 140  --  138  K 3.4*  --  3.5  CL 103  --  102  CO2 26  --  26  GLUCOSE 130*  --  120*  BUN 22  --  23  CREATININE 0.84  --  0.98  CALCIUM 9.8  --  8.9  MG  --  1.9  --    GFR: Estimated Creatinine Clearance: 38.4 mL/min (by C-G formula based on SCr of 0.98 mg/dL). Liver Function Tests: Recent Labs  Lab 03/27/20 1514  AST 18  ALT 13  ALKPHOS 61  BILITOT 1.1  PROT 7.0  ALBUMIN 3.7   No results  for input(s): LIPASE, AMYLASE in the last 168 hours. No results for input(s): AMMONIA in the last 168 hours. Coagulation Profile: No results for input(s): INR, PROTIME in the last 168 hours. Cardiac Enzymes: No results for input(s): CKTOTAL, CKMB, CKMBINDEX, TROPONINI in the last 168 hours. BNP (last 3 results) No results for input(s): PROBNP in the last 8760 hours. HbA1C: No results for input(s): HGBA1C in the last 72 hours. CBG: No results for input(s): GLUCAP in the last 168 hours. Lipid Profile: No results for input(s): CHOL, HDL, LDLCALC, TRIG, CHOLHDL, LDLDIRECT in the last 72 hours. Thyroid Function Tests: No results for input(s): TSH, T4TOTAL, FREET4, T3FREE, THYROIDAB in the last 72 hours. Anemia Panel: No results for input(s): VITAMINB12, FOLATE, FERRITIN, TIBC, IRON, RETICCTPCT in the last 72 hours. Sepsis Labs: Recent Labs  Lab 03/28/20 0914  PROCALCITON <0.10  LATICACIDVEN 1.5    Recent Results (from the past 240 hour(s))  Resp Panel by RT-PCR (Flu A&B, Covid) Nasopharyngeal Swab     Status: None   Collection Time: 03/28/20  3:06 AM   Specimen: Nasopharyngeal Swab; Nasopharyngeal(NP) swabs in vial transport  medium  Result Value Ref Range Status   SARS Coronavirus 2 by RT PCR NEGATIVE NEGATIVE Final    Comment: (NOTE) SARS-CoV-2 target nucleic acids are NOT DETECTED.  The SARS-CoV-2 RNA is generally detectable in upper respiratory specimens during the acute phase of infection. The lowest concentration of SARS-CoV-2 viral copies this assay can detect is 138 copies/mL. A negative result does not preclude SARS-Cov-2 infection and should not be used as the sole basis for treatment or other patient management decisions. A negative result may occur with  improper specimen collection/handling, submission of specimen other than nasopharyngeal swab, presence of viral mutation(s) within the areas targeted by this assay, and inadequate number of viral copies(<138 copies/mL). A negative result must be combined with clinical observations, patient history, and epidemiological information. The expected result is Negative.  Fact Sheet for Patients:  EntrepreneurPulse.com.au  Fact Sheet for Healthcare Providers:  IncredibleEmployment.be  This test is no t yet approved or cleared by the [place] FDA and  has been authorized for detection and/or diagnosis of SARS-CoV-2 by FDA under an Emergency Use Authorization (EUA). This EUA will remain  in effect (meaning this test can be used) for the duration of the COVID-19 declaration under Section 564(b)(1) of the Act, 21 U.S.C.section 360bbb-3(b)(1), unless the authorization is terminated  or revoked sooner.       Influenza A by PCR NEGATIVE NEGATIVE Final   Influenza B by PCR NEGATIVE NEGATIVE Final    Comment: (NOTE) The Xpert Xpress SARS-CoV-2/FLU/RSV plus assay is intended as an aid in the diagnosis of influenza from Nasopharyngeal swab specimens and should not be used as a sole basis for treatment. Nasal washings and aspirates are unacceptable for Xpert Xpress SARS-CoV-2/FLU/RSV testing.  Fact Sheet for  Patients: EntrepreneurPulse.com.au  Fact Sheet for Healthcare Providers: IncredibleEmployment.be  This test is not yet approved or cleared by the [place] FDA and has been authorized for detection and/or diagnosis of SARS-CoV-2 by FDA under an Emergency Use Authorization (EUA). This EUA will remain in effect (meaning this test can be used) for the duration of the COVID-19 declaration under Section 564(b)(1) of the Act, 21 U.S.C. section 360bbb-3(b)(1), unless the authorization is terminated or revoked.  Performed at [facility], [place], [place] [address]   CULTURE, BLOOD (ROUTINE X 2) w Reflex to ID Panel     Status:  None (Preliminary result)   Collection Time: 03/28/20  9:14 AM   Specimen: BLOOD  Result Value Ref Range Status   Specimen Description BLOOD RIGHT HAND  Final   Special Requests   Final    BOTTLES DRAWN AEROBIC AND ANAEROBIC Blood Culture results may not be optimal due to an inadequate volume of blood received in culture bottles   Culture   Final    NO GROWTH < 24 HOURS Performed at [facility], [address], [place], [place] [address]    Report Status PENDING  Incomplete  CULTURE, BLOOD (ROUTINE X 2) w Reflex to ID Panel     Status: None (Preliminary result)   Collection Time: 03/28/20  9:14 AM   Specimen: BLOOD  Result Value Ref Range Status   Specimen Description BLOOD RIGHT [facility]  Final   Special Requests   Final    BOTTLES DRAWN AEROBIC AND ANAEROBIC Blood Culture adequate volume   Culture   Final    NO GROWTH < 24 HOURS Performed at [facility], [address], [place], [place] [address]    Report Status PENDING  Incomplete  MRSA PCR Screening     Status: None   Collection Time: 03/29/20  6:17 AM   Specimen: Nasopharyngeal  Result Value Ref Range Status   MRSA by PCR NEGATIVE NEGATIVE Final    Comment:        The GeneXpert MRSA Assay (FDA approved for NASAL  specimens only), is one component of a comprehensive MRSA colonization surveillance program. It is not intended to diagnose MRSA infection nor to guide or monitor treatment for MRSA infections. Performed at [facility], [address] [place], [place] [address]          Radiology Studies: DG Chest 1 View  Result Date: 03/28/2020 CLINICAL DATA:  Altered mental status EXAM: CHEST  1 VIEW COMPARISON:  05/27/2019 FINDINGS: Heart is normal size. Diffuse interstitial prominence throughout the lungs, favor chronic lung disease. No effusions. Aortic atherosclerosis. No acute bony abnormality. IMPRESSION: Diffuse interstitial prominence throughout the lungs, favor chronic interstitial lung disease. Alternatively, this could reflect viral/atypical infection. Electronically Signed   By: Rolm Baptise M.D.   On: 03/28/2020 00:23   CT Head Wo Contrast  Result Date: 03/28/2020 CLINICAL DATA:  Mental status changes EXAM: CT HEAD WITHOUT CONTRAST TECHNIQUE: Contiguous axial images were obtained from the base of the skull through the vertex without intravenous contrast. COMPARISON:  None. 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 Chronic bilateral basal ganglia lacunar infarcts. There is atrophy and chronic small vessel disease changes. No acute intracranial abnormality. Specifically, no hemorrhage, hydrocephalus, mass lesion, acute infarction, or significant intracranial injury. Vascular: No hyperdense vessel or unexpected calcification. Skull: No acute calvarial abnormality. Sinuses/Orbits: Air-fluid level in the right maxillary sinus. 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 Atrophy, chronic microvascular disease. No acute intracranial abnormality. Electronically Signed   By: Rolm Baptise M.D.   On: 03/28/2020 00:21   CT Angio Chest PE W and/or Wo Contrast  Result Date: 03/28/2020 CLINICAL DATA:  Lethargy, possible  pulmonary embolism, abdominal pain EXAM: CT ANGIOGRAPHY CHEST CT ABDOMEN AND PELVIS WITH CONTRAST TECHNIQUE: Multidetector CT imaging of the chest was performed using the standard protocol during bolus administration of intravenous contrast. Multiplanar CT image reconstructions and MIPs were obtained to evaluate the vascular anatomy. Multidetector CT imaging of the abdomen and pelvis was performed using the standard protocol during bolus administration  of intravenous contrast. CONTRAST:  111mL OMNIPAQUE IOHEXOL 350 MG/ML SOLN COMPARISON:  MR lumbar spine 12/20/2019, fluoroscopy lumbar spine 12/23/2019, 12/14/2015, CT abdomen pelvis 12/28/1998 (report only) FINDINGS: CTA CHEST FINDINGS Cardiovascular: Satisfactory opacification of pulmonary arteries. No large central or lobar pulmonary arterial filling defects are identified. Respiratory motion may limit detection of smaller, subsegmental pulmonary emboli. Borderline dilatation of the the pulmonary trunk. Cardiac size is top normal. Three-vessel coronary artery atherosclerosis is noted. Trace pericardial fluid including fluid within the pericardial recesses. Ascending thoracic aortic dilatation to 4.3 cm. Atherosclerotic plaque throughout the thoracic aorta. No acute luminal abnormality. No focal periaortic stranding or hemorrhage. Cerebral region of the brachiocephalic and left common carotid arteries. The left vertebral artery arises directly from the aortic arch between this shared origin and the left subclavian artery. Proximal great vessels are otherwise unremarkable. Mediastinum/Nodes: Fluid in the pericardial recesses. No mediastinal gas. Normal thyroid gland and thoracic inlet. No acute abnormality of the esophagus. Secretions noted in the trachea, posterior bowing likely related to imaging during exhalation. No worrisome mediastinal, hilar or axillary adenopathy. Shotty low-attenuation subcentimeter mediastinal and hilar lymph nodes are favored to be  edematous or reactive. Lungs/Pleura: Diffuse airways thickening and scattered secretions. Diffuse interlobular septal thickening, pulmonary vascular redistribution and hazy ground-glass opacity particularly within the more dependent portions of the lungs are findings most compatible with interstitial and early alveolar edema. Small to moderate bilateral pleural effusions are present, right slightly greater than left. Adjacent passive atelectatic changes. No pneumothorax. Musculoskeletal: T12 compression deformity with vertebroplasty changes, with 20% residual height loss not significantly changed from fluoroscopic images at the time of procedure 12/23/2019. No new acute osseous abnormalities or suspicious lytic or blastic lesions are seen. No worrisome chest wall masses or lesions. Review of the MIP images confirms the above findings. CT ABDOMEN and PELVIS FINDINGS Hepatobiliary: Few punctate parenchymal calcifications within the liver, largest in segment 4) 5/21) possibly vascular versus prior granulomatous disease. No worrisome focal liver lesions. Smooth liver surface contour. Normal hepatic attenuation. Prior cholecystectomy. Marked distension of the intra and extrahepatic biliary tree with the common bile duct measuring up to 15 mm in diameter, greater than expected for typical post cholecystectomy reservoir effect and senescent change. No visible intraductal gallstones. Pancreas: No pancreatic ductal dilatation or surrounding inflammatory changes. Spleen: Heterogeneous enhancement the spleen is nonspecific and possibly related to arterial phase of contrast imaging though a slightly more wedge-shaped area of hypoattenuation is seen in the posterior spleen (5/20) which could reflect an early splenic infarct. Adrenals/Urinary Tract: No concerning adrenal nodules or masses. There is a hyperdense cystic appearing lesion measuring up to 2.8 cm in the lower pole left kidney though demonstrate some conspicuous  increase in attenuation of approximately 15 Hounsfield units on the delayed phase imaging (5/38, 10/20) more simple appearing cysts are seen elsewhere in both kidneys with a slightly larger minimally complex cyst with thin internal septations arising from the upper pole right kidney measuring up to 2 cm in size (5/38) no other concerning renal lesions. Kidneys enhance and excrete symmetrically. No urolithiasis or hydronephrosis. Excreted contrast material is noted within the collecting system from separate contrast bolus administration. Urinary bladder is otherwise unremarkable. Stomach/Bowel: Sliding-type hiatal hernia. Stomach and duodenum are unremarkable. No small bowel thickening or dilatation. The appendix is surgically absent. No colonic dilatation or wall thickening. Scattered colonic diverticula without focal inflammation to suggest diverticulitis. Some mild segmental thickening of the sigmoid in a region of numerous colonic diverticula without acute pericolonic or  focal diverticular inflammation could reflect sequela of prior inflammation. Mild circumferential anorectal thickening with perirectal fat stranding is nonspecific though could correlate for features of proctitis. Vascular/Lymphatic: Atherosclerotic calcifications within the abdominal aorta and branch vessels. No aneurysm or ectasia. No enlarged abdominopelvic lymph nodes. Reproductive: Prostate appears surgically absent. Other: Perirectal hazy stranding, as above. Postsurgical changes from prior low vertical midline incision. Mild body wall edema. No abdominopelvic free air or fluid. Musculoskeletal: Remote compression deformity L2 with evidence of prior vertebroplasty in slight extravasation of cement into the L3-4 disc space. Up to 70% residual height loss centrally. Stepwise likely degenerative retrolisthesis L2-L5. Additional transitional lumbosacral anatomy, with what is likely a rudimentary disc between the S1 and S2 levels. The osseous  structures appear diffusely demineralized which may limit detection of small or nondisplaced fractures. No acute osseous abnormality or suspicious osseous lesion. Prior left hip arthroplasty with streak artifact. Hardware is in expected alignment without complication. Review of the MIP images confirms the above findings. IMPRESSION: 1. No evidence of large central or lobar pulmonary arterial filling defects to suggest pulmonary embolism. Respiratory motion may limit detection of smaller, subsegmental pulmonary emboli. 2. Features of congestive heart failure with interstitial and early alveolar edema, small to moderate bilateral pleural effusions. 3. Ascending thoracic aortic dilatation to 4.3 cm. No evidence of dissection. Guidelines recommend annual imaging followup by CTA or MRA. This recommendation follows 2010 ACCF/AHA/AATS/ACR/ASA/SCA/SCAI/SIR/STS/SVM Guidelines for the Diagnosis and Management of Patients with Thoracic Aortic Disease. Circulation. 2010; [id number]. Aortic aneurysm NOS (ICD10-I71.9) 4. Heterogeneous enhancement the spleen is nonspecific and possibly related to arterial phase of contrast imaging though a slightly more wedge-shaped area of hypoattenuation is seen in the posterior spleen could reflect an early splenic infarct. 5. Mild circumferential anorectal thickening with perirectal fat stranding is nonspecific though could correlate for features of proctitis, and consider direct visualization. 6. Segmental thickening of the mid sigmoid colon in a region of numerous colonic diverticula the albeit without acute inflammation at this time, may reflect sequela of prior diverticular inflammation. 7. Hyperdense cystic appearing lesion measuring up to 2.8 cm in the lower pole left kidney though demonstrate some conspicuous increase in attenuation of approximately 15 Hounsfield units on the delayed phase imaging. Recommend further evaluation with nonemergent renal ultrasound for further  evaluation. 8. Sliding-type hiatal hernia. 9. Compression deformities and vertebroplasty changes T12 and L2. Electronically Signed   By: Lovena Le M.D.   On: 03/28/2020 05:03   MR BRAIN WO CONTRAST  Result Date: 03/28/2020 CLINICAL DATA:  Mental status change, unknown cause. EXAM: MRI HEAD WITHOUT CONTRAST TECHNIQUE: Multiplanar, multiecho pulse sequences of the brain and surrounding structures were obtained without intravenous contrast. COMPARISON:  CT head 03/28/2020 FINDINGS: Brain: Small acute or early subacute infarct in the right upper pons (see series 5 and 6, image 18). Mild associated edema without mass effect. Remote right frontal lobe infarct with encephalomalacia and surrounding gliosis. Multiple dilated perivascular spaces versus lacunar infarcts in bilateral basal ganglia. Additional scattered T2/FLAIR hyperintensity within the white matter, compatible with chronic microvascular ischemic disease. Moderate diffuse cerebral volume loss with ex vacuo ventricular dilation. No acute hemorrhage. No hydrocephalus. No midline shift. No extra-axial fluid collection. No mass lesion. Vascular: Abnormal flow related signal within the visualized right vertebral artery, concerning for stenosis or occlusion. Skull and upper cervical spine: Diffuse T1 hypointensity of the marrow. Sinuses/Orbits: Inferior right maxillary sinus mucosal thickening. Unremarkable orbits. Other: Small right mastoid effusion. IMPRESSION: 1. Small acute or early subacute infarct in  the right upper pons. Mild associated edema without mass effect. 2. Abnormal flow related signal within the visualized right vertebral artery, concerning for occlusion or significant stenosis. Recommend CTA to further evaluate. 3. Remote right frontal lobe infarct and dilated perivascular spaces versus remote lacunar infarcts in bilateral basal ganglia. 4. Moderate chronic microvascular ischemic disease and generalized cerebral volume loss. Diffuse 5.  Diffuse T1 hypointensity of the marrow, which is nonspecific and could relate to chronic hypoxia (such as in smokers), chronic anemia, or lymphoproliferative disorder. Conclusions #1 and #2 were discussed with Dr. Jaclynn Guarneri Via telephone at 12:25 p.m. Electronically Signed   By: Margaretha Sheffield MD   On: 03/28/2020 12:31   CT ABDOMEN PELVIS W CONTRAST  Result Date: 03/28/2020 CLINICAL DATA:  Lethargy, possible pulmonary embolism, abdominal pain EXAM: CT ANGIOGRAPHY CHEST CT ABDOMEN AND PELVIS WITH CONTRAST TECHNIQUE: Multidetector CT imaging of the chest was performed using the standard protocol during bolus administration of intravenous contrast. Multiplanar CT image reconstructions and MIPs were obtained to evaluate the vascular anatomy. Multidetector CT imaging of the abdomen and pelvis was performed using the standard protocol during bolus administration of intravenous contrast. CONTRAST:  139mL OMNIPAQUE IOHEXOL 350 MG/ML SOLN COMPARISON:  MR lumbar spine 12/20/2019, fluoroscopy lumbar spine 12/23/2019, 12/14/2015, CT abdomen pelvis 12/28/1998 (report only) FINDINGS: CTA CHEST FINDINGS Cardiovascular: Satisfactory opacification of pulmonary arteries. No large central or lobar pulmonary arterial filling defects are identified. Respiratory motion may limit detection of smaller, subsegmental pulmonary emboli. Borderline dilatation of the the pulmonary trunk. Cardiac size is top normal. Three-vessel coronary artery atherosclerosis is noted. Trace pericardial fluid including fluid within the pericardial recesses. Ascending thoracic aortic dilatation to 4.3 cm. Atherosclerotic plaque throughout the thoracic aorta. No acute luminal abnormality. No focal periaortic stranding or hemorrhage. Cerebral region of the brachiocephalic and left common carotid arteries. The left vertebral artery arises directly from the aortic arch between this shared origin and the left subclavian artery. Proximal great vessels are otherwise  unremarkable. Mediastinum/Nodes: Fluid in the pericardial recesses. No mediastinal gas. Normal thyroid gland and thoracic inlet. No acute abnormality of the esophagus. Secretions noted in the trachea, posterior bowing likely related to imaging during exhalation. No worrisome mediastinal, hilar or axillary adenopathy. Shotty low-attenuation subcentimeter mediastinal and hilar lymph nodes are favored to be edematous or reactive. Lungs/Pleura: Diffuse airways thickening and scattered secretions. Diffuse interlobular septal thickening, pulmonary vascular redistribution and hazy ground-glass opacity particularly within the more dependent portions of the lungs are findings most compatible with interstitial and early alveolar edema. Small to moderate bilateral pleural effusions are present, right slightly greater than left. Adjacent passive atelectatic changes. No pneumothorax. Musculoskeletal: T12 compression deformity with vertebroplasty changes, with 20% residual height loss not significantly changed from fluoroscopic images at the time of procedure 12/23/2019. No new acute osseous abnormalities or suspicious lytic or blastic lesions are seen. No worrisome chest wall masses or lesions. Review of the MIP images confirms the above findings. CT ABDOMEN and PELVIS FINDINGS Hepatobiliary: Few punctate parenchymal calcifications within the liver, largest in segment 4) 5/21) possibly vascular versus prior granulomatous disease. No worrisome focal liver lesions. Smooth liver surface contour. Normal hepatic attenuation. Prior cholecystectomy. Marked distension of the intra and extrahepatic biliary tree with the common bile duct measuring up to 15 mm in diameter, greater than expected for typical post cholecystectomy reservoir effect and senescent change. No visible intraductal gallstones. Pancreas: No pancreatic ductal dilatation or surrounding inflammatory changes. Spleen: Heterogeneous enhancement the spleen is nonspecific and  possibly related  to arterial phase of contrast imaging though a slightly more wedge-shaped area of hypoattenuation is seen in the posterior spleen (5/20) which could reflect an early splenic infarct. Adrenals/Urinary Tract: No concerning adrenal nodules or masses. There is a hyperdense cystic appearing lesion measuring up to 2.8 cm in the lower pole left kidney though demonstrate some conspicuous increase in attenuation of approximately 15 Hounsfield units on the delayed phase imaging (5/38, 10/20) more simple appearing cysts are seen elsewhere in both kidneys with a slightly larger minimally complex cyst with thin internal septations arising from the upper pole right kidney measuring up to 2 cm in size (5/38) no other concerning renal lesions. Kidneys enhance and excrete symmetrically. No urolithiasis or hydronephrosis. Excreted contrast material is noted within the collecting system from separate contrast bolus administration. Urinary bladder is otherwise unremarkable. Stomach/Bowel: Sliding-type hiatal hernia. Stomach and duodenum are unremarkable. No small bowel thickening or dilatation. The appendix is surgically absent. No colonic dilatation or wall thickening. Scattered colonic diverticula without focal inflammation to suggest diverticulitis. Some mild segmental thickening of the sigmoid in a region of numerous colonic diverticula without acute pericolonic or focal diverticular inflammation could reflect sequela of prior inflammation. Mild circumferential anorectal thickening with perirectal fat stranding is nonspecific though could correlate for features of proctitis. Vascular/Lymphatic: Atherosclerotic calcifications within the abdominal aorta and branch vessels. No aneurysm or ectasia. No enlarged abdominopelvic lymph nodes. Reproductive: Prostate appears surgically absent. Other: Perirectal hazy stranding, as above. Postsurgical changes from prior low vertical midline incision. Mild body wall edema. No  abdominopelvic free air or fluid. Musculoskeletal: Remote compression deformity L2 with evidence of prior vertebroplasty in slight extravasation of cement into the L3-4 disc space. Up to 70% residual height loss centrally. Stepwise likely degenerative retrolisthesis L2-L5. Additional transitional lumbosacral anatomy, with what is likely a rudimentary disc between the S1 and S2 levels. The osseous structures appear diffusely demineralized which may limit detection of small or nondisplaced fractures. No acute osseous abnormality or suspicious osseous lesion. Prior left hip arthroplasty with streak artifact. Hardware is in expected alignment without complication. Review of the MIP images confirms the above findings. IMPRESSION: 1. No evidence of large central or lobar pulmonary arterial filling defects to suggest pulmonary embolism. Respiratory motion may limit detection of smaller, subsegmental pulmonary emboli. 2. Features of congestive heart failure with interstitial and early alveolar edema, small to moderate bilateral pleural effusions. 3. Ascending thoracic aortic dilatation to 4.3 cm. No evidence of dissection. Guidelines recommend annual imaging followup by CTA or MRA. This recommendation follows 2010 ACCF/AHA/AATS/ACR/ASA/SCA/SCAI/SIR/STS/SVM Guidelines for the Diagnosis and Management of Patients with Thoracic Aortic Disease. Circulation. 2010; [id number]. Aortic aneurysm NOS (ICD10-I71.9) 4. Heterogeneous enhancement the spleen is nonspecific and possibly related to arterial phase of contrast imaging though a slightly more wedge-shaped area of hypoattenuation is seen in the posterior spleen could reflect an early splenic infarct. 5. Mild circumferential anorectal thickening with perirectal fat stranding is nonspecific though could correlate for features of proctitis, and consider direct visualization. 6. Segmental thickening of the mid sigmoid colon in a region of numerous colonic diverticula the albeit  without acute inflammation at this time, may reflect sequela of prior diverticular inflammation. 7. Hyperdense cystic appearing lesion measuring up to 2.8 cm in the lower pole left kidney though demonstrate some conspicuous increase in attenuation of approximately 15 Hounsfield units on the delayed phase imaging. Recommend further evaluation with nonemergent renal ultrasound for further evaluation. 8. Sliding-type hiatal hernia. 9. Compression deformities and vertebroplasty changes T12 and  L2. Electronically Signed   By: Lovena Le M.D.   On: 03/28/2020 05:03   ECHOCARDIOGRAM COMPLETE  Result Date: 03/28/2020    ECHOCARDIOGRAM REPORT   Patient Name:   Gerald [name] Date of Exam: 03/28/2020 Medical Rec #:  [id number]           Height:       69.0 in Accession #:    [id number]          Weight:       138.9 lb Date of Birth:  14-May-1920          BSA:          1.769 m Patient Age:    76 years            BP:           119/81 mmHg Patient Gender: M                   HR:           119 bpm. Exam Location:  [facility] Procedure: 2D Echo, Color Doppler and Cardiac Doppler Indications:     CHF-acute diastolic [id number]  History:         Patient has no prior history of Echocardiogram examinations.                  Arrythmias:Atrial Fibrillation; Risk Factors:Hypertension.  Sonographer:     Sherrie Sport RDCS (AE) Referring Phys:  [place] [id number] Diagnosing Phys: Yolonda Kida MD IMPRESSIONS  1. Inferior hypokinesis.  2. Left ventricular ejection fraction, by estimation, is 50 to 55%. The left ventricle has low normal function. The left ventricle demonstrates regional wall motion abnormalities (see scoring diagram/findings for description). Left ventricular diastolic  parameters were normal.  3. Right ventricular systolic function is normal. The right ventricular size is normal.  4. The mitral valve is normal in structure. Mild mitral valve regurgitation.  5. The aortic valve is normal in structure. Aortic valve  regurgitation is trivial. FINDINGS  Left Ventricle: Left ventricular ejection fraction, by estimation, is 50 to 55%. The left ventricle has low normal function. The left ventricle demonstrates regional wall motion abnormalities. The left ventricular internal cavity size was normal in size. There is no left ventricular hypertrophy. Left ventricular diastolic parameters were normal. Right Ventricle: The right ventricular size is normal. No increase in right ventricular wall thickness. Right ventricular systolic function is normal. Left Atrium: Left atrial size was normal in size. Right Atrium: Right atrial size was normal in size. Pericardium: There is no evidence of pericardial effusion. Mitral Valve: The mitral valve is normal in structure. Mild mitral valve regurgitation. Tricuspid Valve: The tricuspid valve is normal in structure. Tricuspid valve regurgitation is mild. Aortic Valve: The aortic valve is normal in structure. Aortic valve regurgitation is trivial. Aortic valve mean gradient measures 2.0 mmHg. Aortic valve peak gradient measures 3.1 mmHg. Aortic valve area, by VTI measures 3.96 cm. Pulmonic Valve: The pulmonic valve was grossly normal. Pulmonic valve regurgitation is not visualized. Aorta: The ascending aorta was not well visualized. IAS/Shunts: No atrial level shunt detected by color flow Doppler. Additional Comments: Inferior hypokinesis.  LEFT VENTRICLE PLAX 2D LVIDd:         3.90 cm LVIDs:         2.80 cm LV PW:         1.20 cm LV IVS:        1.30 cm LVOT diam:  2.30 cm LV SV:         40 LV SV Index:   23 LVOT Area:     4.15 cm  RIGHT VENTRICLE RV Basal diam:  2.40 cm RV S prime:     17.40 cm/s TAPSE (M-mode): 3.0 cm LEFT ATRIUM             Index       RIGHT ATRIUM          Index LA diam:        4.40 cm 2.49 cm/m  RA Area:     7.20 cm LA Vol (A2C):   39.5 ml 22.32 ml/m RA Volume:   12.30 ml 6.95 ml/m LA Vol (A4C):   17.1 ml 9.66 ml/m LA Biplane Vol: 27.6 ml 15.60 ml/m  AORTIC VALVE                    PULMONIC VALVE AV Area (Vmax):    2.70 cm    PV Vmax:        0.38 m/s AV Area (Vmean):   3.02 cm    PV Peak grad:   0.6 mmHg AV Area (VTI):     3.96 cm    RVOT Peak grad: 2 mmHg AV Vmax:           87.50 cm/s AV Vmean:          55.800 cm/s AV VTI:            0.101 m AV Peak Grad:      3.1 mmHg AV Mean Grad:      2.0 mmHg LVOT Vmax:         56.80 cm/s LVOT Vmean:        40.600 cm/s LVOT VTI:          0.096 m LVOT/AV VTI ratio: 0.95  AORTA Ao Root diam: 3.77 cm MITRAL VALVE               TRICUSPID VALVE MV Area (PHT): 3.84 cm    TR Peak grad:   8.6 mmHg MV Decel Time: 197 msec    TR Vmax:        147.00 cm/s MV E velocity: 77.43 cm/s                            SHUNTS                            Systemic VTI:  0.10 m                            Systemic Diam: 2.30 cm Dwayne Prince Rome MD Electronically signed by Yolonda Kida MD Signature Date/Time: 03/28/2020/4:43:57 PM    Final    Scheduled Meds: . aspirin EC  325 mg Oral Daily  . docusate sodium  100 mg Oral BID  . enoxaparin (LOVENOX) injection  40 mg Subcutaneous Q24H  . LORazepam  0.5 mg Intravenous Once   Continuous Infusions: . dextrose 5 % and 0.45 % NaCl with KCl 20 mEq/L 75 mL/hr at 03/29/20 0918  . piperacillin-tazobactam (ZOSYN)  IV 3.375 g (03/29/20 1433)     LOS: 1 day    Time spent: 35 mins.    Shawna Clamp, MD [facility]   If 7PM-7AM, please contact night-coverage

## 2020-03-29 NOTE — ED Notes (Signed)
This RN to bedside at this time. Introduced self to pt daughter at this time. Pt visualized resting comfortably in bed with eyes closed, NAD noted, VSS, respirations even and unlabored at this time.   Pt daughter denies needs at this time, this RN reminded pt/daughter to use call bell to make any needs known. Pt daughter verbalized understanding at this time.

## 2020-03-29 NOTE — Evaluation (Addendum)
Physical Therapy Evaluation Patient Details Name: Gerald [name] MRN: [id number] DOB: Jul 07, 1920 Today's Date: 03/29/2020   History of Present Illness  Gerald [name] is a 78HYW who [facility] cancer center on 1/10 due to recent increased lethargy and low Hb. Pe rDTR pt was treated for a cold around Christmas c ABX, since struggled with AMB, been in bed for 3 weeks.  PMH: anemia, AF on aspirin, hemochromatosis, HTN, Left anterior THA March '21 s/po fall, multiple lumbar compression fractures s/p lordoplasty x1. 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 Mild associated edema without mass effect.(...)Abnormal flow related signal within the visualized right vertebral artery, concerning for occlusion or significant stenosis. Recommend CTA to further evaluate(...)Remote right frontal lobe infarct and dilated perivascular spaces versus remote lacunar infarcts in bilateral basal ganglia." Neurology note from 1/11 also mentions "May have underlying Parkinsonism given cog wheeling and tremor seen on examination. Family relates that prior to March, he had developed a mild shuffling gait."  Clinical Impression  Pt admitted with above diagnosis. Pt currently with functional limitations due to the deficits listed below (see "PT Problem List"). Upon entry, pt in bed, awake and agreeable to participate. DTR, wife, OT all at bedside. The pt is alert, calm, somewhat interactive, but mostly nonverbal in responses. Pt not able to provide much info regarding prior level of function, both in tolerance and independence, this information collected from family. Max-total +2A for bed mobility, max-totalA+2 for STS transfers, pt never really able to establish upright standing. Pt struggles with sitting EOB, poor activation of trunk flexors (likely 2/2 chronic lumbar fractures), BUE pull forward fatigues quickly. Pt able to sit EOB for several  minutes, only short spurts without min-modA of trunk (continual posterior lean). Patient's performance this date reveals decreased ability, independence, and tolerance in performing all basic mobility required for performance of activities of daily living. Pt requires additional DME, close physical assistance, and cues for safe participate in mobility. Pt will benefit from skilled PT intervention to increase independence and safety with basic mobility in preparation for discharge to the venue listed below.       Follow Up Recommendations Home health PT;Supervision for mobility/OOB;Supervision - Intermittent    Equipment Recommendations  Other (comment) (mechanica lift for home)    Recommendations for Other Services       Precautions / Restrictions Precautions Precautions: Fall Restrictions Weight Bearing Restrictions: No      Mobility  Bed Mobility Overal bed mobility: Needs Assistance Bed Mobility: Supine to Sit;Sit to Supine     Supine to sit: Total assist;+2 for physical assistance Sit to supine: Total assist;+2 for physical assistance        Transfers Overall transfer level: Needs assistance   Transfers: Sit to/from Stand Sit to Stand: Max assist;+2 physical assistance         General transfer comment: very limited in ability to assist; difficulty holding onto author  Ambulation/Gait Ambulation/Gait assistance:  (not safe to attempt at this time pt unable to stand without max support)              Stairs            Wheelchair Mobility    Modified Rankin (Stroke Patients Only)       Balance Overall balance assessment: Needs assistance Sitting-balance support: Bilateral upper extremity supported;Feet supported Sitting balance-Leahy Scale: Poor     Standing balance support: Bilateral upper extremity supported;During  functional activity Standing balance-Leahy Scale: Zero                               Pertinent Vitals/Pain Pain  Assessment: Faces Faces Pain Scale: Hurts even more Pain Location: chronic lumbar pain, history of compression fractures, kyphoplasty x1 Pain Intervention(s): Monitored during session;Limited activity within patient's tolerance;Premedicated before session;Repositioned    Home Living Family/patient expects to be discharged to:: Private residence Living Arrangements: Children Available Help at Discharge: Family Type of Home: House Home Access: Stairs to enter Entrance Stairs-Rails: None Entrance Stairs-Number of Steps: 4 Home Layout: Multi-level;Bed/bath upstairs;Able to live on main level with bedroom/bathroom Home Equipment: Gilford Rile - 2 wheels;Shower seat;Wheelchair - [employer];Other (comment) Additional Comments: adjustable bed; lift chair; STS lift?    Prior Function Level of Independence: Needs assistance   Gait / Transfers Assistance Needed: 1 month ago was able to AMB limited household distances with supervision and walker.  ADL's / Homemaking Assistance Needed: Has been total assist for past 3 weeks, mostly in bed; previously        Hand Dominance   Dominant Hand: Right    Extremity/Trunk Assessment   Upper Extremity Assessment Upper Extremity Assessment: Overall WFL for tasks assessed    Lower Extremity Assessment Lower Extremity Assessment: Overall WFL for tasks assessed    Cervical / Trunk Assessment Cervical / Trunk Assessment: Kyphotic  Communication      Cognition Arousal/Alertness: Awake/alert Behavior During Therapy: WFL for tasks assessed/performed Overall Cognitive Status: Within Functional Limits for tasks assessed                                        General Comments      Exercises     Assessment/Plan    PT Assessment Patient needs continued PT services  PT Problem List Decreased strength;Decreased cognition;Decreased range of motion;Decreased activity tolerance;Decreased balance;Decreased mobility       PT  Treatment Interventions Balance training;DME instruction;Stair training;Gait training;Functional mobility training;Therapeutic activities;Therapeutic exercise;Patient/family education    PT Goals (Current goals can be found in the Care Plan section)  Acute Rehab PT Goals Patient Stated Goal: regain ability to AMB short distances PT Goal Formulation: With family Time For Goal Achievement: 04/12/20 Potential to Achieve Goals: Poor    Frequency 7X/week (QD trial, although pt was declining in mobility prior to CVA (QD may not be appropriate based on pending progress))   Barriers to discharge        Co-evaluation               AM-PAC PT "6 Clicks" Mobility  Outcome Measure Help needed turning from your back to your side while in a flat bed without using bedrails?: Total Help needed moving from lying on your back to sitting on the side of a flat bed without using bedrails?: Total Help needed moving to and from a bed to a chair (including a wheelchair)?: Total Help needed standing up from a chair using your arms (e.g., wheelchair or bedside chair)?: Total Help needed to walk in hospital room?: Total Help needed climbing 3-5 steps with a railing? : Total 6 Click Score: 6    End of Session   Activity Tolerance: Patient limited by fatigue;Patient limited by pain Patient left: in bed;with family/visitor present;with nursing/sitter in room   PT Visit Diagnosis: Muscle weakness (generalized) (M62.81);Other abnormalities of  gait and mobility (R26.89);Difficulty in walking, not elsewhere classified (R26.2)    Time: 2297-9892 PT Time Calculation (min) (ACUTE ONLY): 27 min   Charges:   PT Evaluation $PT Eval High Complexity: 1 High          11:27 AM, 03/29/20 Etta Grandchild, PT, DPT Physical Therapist - [facility]  [phone] ([place])  [place] C 03/29/2020, 11:23 AM

## 2020-03-29 NOTE — Evaluation (Signed)
Clinical/Bedside Swallow Evaluation Patient Details  Name: Gerald [name] MRN: [id number] Date of Birth: 08/31/1920  Today's Date: 03/29/2020 Time: SLP Start Time (ACUTE ONLY): 0802 SLP Stop Time (ACUTE ONLY): 0825 SLP Time Calculation (min) (ACUTE ONLY): 23 min  Past Medical History:  Past Medical History:  Diagnosis Date  . Anemia   . Arthritis   . Atrial fibrillation ([place])   . Cancer [facility])    Testicular  . Hemochromatosis 10/25/2014  . Hx of basal cell carcinoma 2008   R. ant. zygomatic/sideburn 2008, L preauricular 2019  . Hypertension   . Prostate cancer [facility])    Past Surgical History:  Past Surgical History:  Procedure Laterality Date  . ANTERIOR APPROACH HEMI HIP ARTHROPLASTY Left 05/28/2019   Procedure: ANTERIOR APPROACH HEMI HIP ARTHROPLASTY;  Surgeon: Hessie Knows, MD;  Location: [facility] ORS;  Service: Orthopedics;  Laterality: Left;  . APPENDECTOMY    . CHOLECYSTECTOMY    . HAND SURGERY Bilateral   . HERNIA REPAIR    . INNER EAR SURGERY    . KYPHOPLASTY N/A 12/14/2015   Procedure: KYPHOPLASTY  L2;  Surgeon: Hessie Knows, MD;  Location: [facility] ORS;  Service: Orthopedics;  Laterality: N/A;  . KYPHOPLASTY N/A 12/23/2019   Procedure: KYPHOPLASTY;  Surgeon: Hessie Knows, MD;  Location: [facility] ORS;  Service: Orthopedics;  Laterality: N/A;  . PROSTATE SURGERY    . SURGERY SCROTAL / TESTICULAR Right    HPI:  Gerald [name] is an 87 y.o. male with a PMHx of atrial fibrillation, hemochromatosis, myelodysplastic syndrome with anemia, HTN, prostate cancer and arthritis, presenting to the ED with several weeks of progressive diffuse weakness and decreased speech, followed by more precipitous drop in his functioning to a state described as lethargy, since Friday. Per neurologist, pt is demonstrating a "Cognitive impairment with progressively decreased speech output. 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 There are no definite correlating findings on exam. Most likely secondary to his atrial fibrillation." Chest x-ray revealed diffuse interstitial prominence throughout the lungs, favor chronic interstitial lung disease, alternatively, could reflect viral/atypical infection.   Assessment / Plan / Recommendation Clinical Impression  Pt presents with mild oral phase and moderate pharyngeal phase dysphagia that is likely related to overall medical decline and cognitive status. Pt's daughter was present during this evaluation and states that pt does cough at home when consuming thin liquids. During this evaluation pt's mentation continues to be altered and he required ice chips to his lips to perceive cup or straw. Once pt consumed the ice chips, he was able to draw water thru a straw and perceive cup at his lips. However pt with immediate intense coughing suspect d/t delayed swallow initiation. When consuming nectar thick liquid via spoon and cup, pt was free of overt s/s of aspiration. Pt's oral phase can be described as passive lingual manipulation when consuming puree and medicine crushed in puree during this evaluation. Given current deficits in mentation and severity of oropharyngeal deficits already observed, more advanced solids were not attempted. Pt's daughter reports an overall decline in pt with reduced verbal output, now requires someone to feed him, and increased cough with thin liquids. As such, spoke with daughter about Palliative Care continuing to follow at home. Extensive education provided on aspiration precautions and diet/liquid consistency. All questions answered to her  satisfaction. ST intervention is not indicated. SLP Visit Diagnosis: Dysphagia, oropharyngeal phase (R13.12)     Aspiration Risk  Moderate aspiration risk;Severe aspiration risk    Diet Recommendation Dysphagia 1 (Puree);Nectar-thick liquid   Liquid Administration via: Cup;Spoon Medication Administration: Crushed with puree Supervision: Full supervision/cueing for compensatory strategies;Staff to assist with self feeding (daughter is trained) Compensations: Minimize environmental distractions;Slow rate;Small sips/bites Postural Changes: Seated upright at 90 degrees    Other  Recommendations Oral Care Recommendations: Oral care BID   Follow up Recommendations  (Palliative Care)             Prognosis Prognosis for Safe Diet Advancement:  (poor) Barriers to Reach Goals: Cognitive deficits;Severity of deficits      Swallow Study   General Date of Onset: 03/28/20 HPI: Gerald [name] is an 87 y.o. male with a PMHx of atrial fibrillation, hemochromatosis, myelodysplastic syndrome with anemia, HTN, prostate cancer and arthritis, presenting to the ED with several weeks of progressive diffuse weakness and decreased speech, followed by more precipitous drop in his functioning to a state described as lethargy, since Friday. Per neurologist, pt is demonstrating a "Cognitive impairment with progressively decreased speech output. Overall clinical findings suggestive of a possible underlying dementia with subacute cognitive worsening secondary to intecurrent illness, most likely multifactorial, including possible CHF exacerbation, intrinsic lung disease, anemia, deconditioning and infection., Acute punctate right pontine lacunar infarction on MRI. There are no definite correlating findings on exam. Most likely secondary to his atrial fibrillation." Chest x-ray revealed diffuse interstitial prominence throughout the lungs, favor chronic interstitial lung disease, alternatively, could reflect viral/atypical infection. Type of Study: Bedside Swallow Evaluation Previous Swallow Assessment: none in chart Diet  Prior to this Study: Dysphagia 2 (chopped);Thin liquids Temperature Spikes Noted: No Respiratory Status: Room air History of Recent Intubation: No Behavior/Cognition: Lethargic/Drowsy;Requires cueing;Doesn't follow directions;Confused Oral Cavity Assessment: Within Functional Limits Oral Care Completed by SLP: Recent completion by staff Oral Cavity - Dentition: Adequate natural dentition Self-Feeding Abilities: Total assist Patient Positioning: Partially reclined (d/t severe back pain) Baseline Vocal Quality: Not observed Volitional Cough: Cognitively unable to elicit Volitional Swallow: Unable to elicit    Oral/Motor/Sensory Function Overall Oral Motor/Sensory Function:  (grossly adequate)   Ice Chips Ice chips: Within functional limits Presentation: Spoon   Thin Liquid Thin Liquid: Impaired Presentation: Cup;Straw;Spoon Oral Phase Impairments: Poor awareness of bolus Pharyngeal  Phase Impairments: Suspected delayed Swallow;Cough - Immediate    Nectar Thick Nectar Thick Liquid: Within functional limits Presentation: Spoon;Cup   Honey Thick Honey Thick Liquid: Not tested   Puree Puree: Within functional limits Presentation: Spoon   Solid     Solid: Not tested (d/t severity of deficits both oral and cognitive)     Marlon Vonruden B. Rutherford Nail M.S., CCC-SLP, [place] Office [phone]  Miranda Garber Rutherford Nail 03/29/2020,1:06 PM

## 2020-03-29 NOTE — ED Notes (Addendum)
Pt cleaned after very small BM at this time. Pt turned with pt daughter assisting this RN. Clean brief applied at this time. This RN noted mepilex sacral dressing on pt to be clean, dry, and intact at this time.

## 2020-03-29 NOTE — ED Notes (Signed)
ED tech Zach to transport pt to floor.

## 2020-03-29 NOTE — ED Notes (Addendum)
Pt transported at this time by Thedore Mins and Eman NT

## 2020-03-29 NOTE — Progress Notes (Signed)
  Chaplain On-Call responded to Order Requisition for the [place].  Chaplain met patient's daughter in room doorway; patient is not available.  Daughter stated that Valeda Malm had just visited, and that he will make a repeat call to the [place] whom he messaged yesterday with the same request for Anointing.  [place] Blandina Renaldo M.Div., [facility]

## 2020-03-29 NOTE — ED Notes (Signed)
ED Provider at bedside. 

## 2020-03-29 NOTE — ED Notes (Signed)
IVF requested from pharm.

## 2020-03-29 NOTE — ED Notes (Signed)
Dr. Sidney Ace contacted to obtain order for lidocaine patch, per request of family member.

## 2020-03-29 NOTE — Progress Notes (Signed)
CH received call this AM from Fr. Evette Doffing re: Ch's request yesterday for anointing of the sick --> CH confirmed that pt. had been admitted and visited rm. briefly; dtr. at bedside, pt. asleep.  Dtr. confirms family still interested in anointing from Fr. Vincent --> Ch relayed message to priest.  No further needs at this time.

## 2020-03-30 DIAGNOSIS — G9340 Encephalopathy, unspecified: Secondary | ICD-10-CM | POA: Diagnosis not present

## 2020-03-30 DIAGNOSIS — R531 Weakness: Secondary | ICD-10-CM | POA: Diagnosis not present

## 2020-03-30 DIAGNOSIS — Z515 Encounter for palliative care: Secondary | ICD-10-CM

## 2020-03-30 LAB — MAGNESIUM: Magnesium: 1.8 mg/dL (ref 1.7–2.4)

## 2020-03-30 LAB — COMPREHENSIVE METABOLIC PANEL
ALT: 11 U/L (ref 0–44)
AST: 16 U/L (ref 15–41)
Albumin: 3.2 g/dL — ABNORMAL LOW (ref 3.5–5.0)
Alkaline Phosphatase: 54 U/L (ref 38–126)
Anion gap: 11 (ref 5–15)
BUN: 22 mg/dL (ref 8–23)
CO2: 25 mmol/L (ref 22–32)
Calcium: 9 mg/dL (ref 8.9–10.3)
Chloride: 106 mmol/L (ref 98–111)
Creatinine, Ser: 0.91 mg/dL (ref 0.61–1.24)
GFR, Estimated: >60 mL/min (ref >60)
Glucose, Bld: 105 mg/dL — ABNORMAL HIGH (ref 70–99)
Potassium: 3.4 mmol/L — ABNORMAL LOW (ref 3.5–5.1)
Sodium: 142 mmol/L (ref 135–145)
Total Bilirubin: 0.9 mg/dL (ref 0.3–1.2)
Total Protein: 6 g/dL — ABNORMAL LOW (ref 6.5–8.1)

## 2020-03-30 LAB — CBC
HCT: 28.6 % — ABNORMAL LOW (ref 39.0–52.0)
Hemoglobin: 9.3 g/dL — ABNORMAL LOW (ref 13.0–17.0)
MCH: 30.4 pg (ref 26.0–34.0)
MCHC: 32.5 g/dL (ref 30.0–36.0)
MCV: 93.5 fL (ref 80.0–100.0)
Platelets: 408 10*3/uL — ABNORMAL HIGH (ref 150–400)
RBC: 3.06 MIL/uL — ABNORMAL LOW (ref 4.22–5.81)
RDW: 22.5 % — ABNORMAL HIGH (ref 11.5–15.5)
WBC: 8.1 10*3/uL (ref 4.0–10.5)
nRBC: 0 % (ref 0.0–0.2)

## 2020-03-30 LAB — PHOSPHORUS: Phosphorus: 3.8 mg/dL (ref 2.5–4.6)

## 2020-03-30 MED ORDER — POTASSIUM CHLORIDE CRYS ER 20 MEQ PO TBCR
40.0000 meq | EXTENDED_RELEASE_TABLET | Freq: Once | ORAL | Status: AC
  Administered 2020-03-30: 40 meq via ORAL
  Filled 2020-03-30: qty 2

## 2020-03-30 MED ORDER — ASPIRIN 81 MG PO CHEW
324.0000 mg | CHEWABLE_TABLET | Freq: Every day | ORAL | Status: DC
  Administered 2020-03-31 – 2020-04-04 (×5): 324 mg via ORAL
  Filled 2020-03-30 (×5): qty 4

## 2020-03-30 NOTE — Consult Note (Signed)
Pharmacy Antibiotic Note  Gerald [name] is a 87 y.o. male admitted on 03/28/2020 with proctitis, possible CAP. Pt presented with weakness and altered mental status. PMH includes HTN, myelodysplastic syndrome, mitral regurgitation, anemia, history of prostate/testicular cancer, and paroxysmal afib. Pt underwent a hip surgery a year ago and family reports a gradual decline since. Pt began to worsen ~ 3 weeks ago. MRI on 1/11 concerning for small acute or early subacute infarct in the right upper pons. Pharmacy has been consulted for pip/tazo dosing.  WBC 11>9.3>8.1, Scr 0.98>0.91, PCT < 0.10  Day 3 abx  Plan: Zosyn 3.375g IV q8h (4 hour infusion).  Monitor renal function with AM labs  Height: 5\' 9"  (175.3 cm) Weight: 65.6 kg (144 lb 11.2 oz) IBW/kg (Calculated) : 70.7  Temp (24hrs), Avg:98 F (36.7 C), Min:97.7 F (36.5 C), Max:98.3 F (36.8 C)  Recent Labs  Lab 03/27/20 1514 03/28/20 0914 03/29/20 0609 03/30/20 0505  WBC 11.0*  --  9.3 8.1  CREATININE 0.84  --  0.98 0.91  LATICACIDVEN  --  1.5  --   --     Estimated Creatinine Clearance: 43.1 mL/min (by C-G formula based on SCr of 0.91 mg/dL).    Allergies  Allergen Reactions  . Levofloxacin Other (See Comments)    Taste alterations for about 1 month after taking med   Antimicrobials this admission: 1/11 pip/tazo >>   Microbiology results: 1/12 MRSA PCR: negative 1/11 BCx: NGTD  Thank you for allowing pharmacy to be a part of this patient's care.  Benn Moulder, PharmD Pharmacy Resident  03/30/2020 2:02 PM

## 2020-03-30 NOTE — Consult Note (Signed)
CARDIOLOGY CONSULT NOTE               Patient ID: Gerald [name] MRN: [id number] DOB/AGE: 08/02/1920 87 y.o.  Admit date: 03/28/2020 Referring Physician Dr. Shawna Clamp  Primary Physician Dr. [name]  Primary Cardiologist Dr. Saralyn Pilar  Reason for Consultation Abnormal echo  HPI: Gerald [name] is a 87 year old male with a past medical history significant for paroxsymal atrial fibrillation, on aspirin alone due to patient preference/fall risk, mitral insufficiency, history of sinus bradycardia, hypertension, myelodysplastic syndrome, and history of prostate/tesicular cancer who presented to the ED on 03/28/20 for a 3 week history of cough and congestion and an acute onset of declining mental status.  Brain MRI revealed a small acute or early subacute infarct in the right upper pons with mild associated edema, no evidence of mass effect.  High sensitivity troponin was borderline elevated x 2, 24 and 27 respectively with ECG revealing normal sinus rhythm, rate of 80bpm, with no obvious evidence of acute ischemia.    He he is followed in outpatient cardiology by Dr. Saralyn Pilar - last office visit in 2019.  Echocardiogram this admission revealing low normal LV systolic function, an EF estimated between 50-55% with inferior hypokinesis.    [id number]: Mr. [name] is sleeping, in no acute distress.  Information provided at bedside by patient's daughter.  No recent evidence of chest pain or breakthrough atrial fibrillation.  No evidence of shortness of breath or lower extremity swelling.  Echocardiogram discussed and patient's daughter agrees that no further cardiac workup is necessary at this time.   Review of systems complete and found to be negative unless listed above     Past Medical History:  Diagnosis Date  . Anemia   . Arthritis   . Atrial fibrillation ([place])   . Cancer [facility])    Testicular  . Hemochromatosis 10/25/2014  . Hx of basal cell carcinoma 2008   R. ant.  zygomatic/sideburn 2008, L preauricular 2019  . Hypertension   . Prostate cancer [facility])     Past Surgical History:  Procedure Laterality Date  . ANTERIOR APPROACH HEMI HIP ARTHROPLASTY Left 05/28/2019   Procedure: ANTERIOR APPROACH HEMI HIP ARTHROPLASTY;  Surgeon: Hessie Knows, MD;  Location: [facility] ORS;  Service: Orthopedics;  Laterality: Left;  . APPENDECTOMY    . CHOLECYSTECTOMY    . HAND SURGERY Bilateral   . HERNIA REPAIR    . INNER EAR SURGERY    . KYPHOPLASTY N/A 12/14/2015   Procedure: KYPHOPLASTY  L2;  Surgeon: Hessie Knows, MD;  Location: [facility] ORS;  Service: Orthopedics;  Laterality: N/A;  . KYPHOPLASTY N/A 12/23/2019   Procedure: KYPHOPLASTY;  Surgeon: Hessie Knows, MD;  Location: [facility] ORS;  Service: Orthopedics;  Laterality: N/A;  . PROSTATE SURGERY    . SURGERY SCROTAL / TESTICULAR Right     Medications Prior to Admission  Medication Sig Dispense Refill Last Dose  . acetaminophen (TYLENOL) 650 MG CR tablet Take 650 mg by mouth 2 (two) times daily as needed for pain.   03/27/2020 at 0800  . Calcium-Magnesium-Zinc (CAL-MAG-ZINC PO) Take 1 tablet by mouth daily.   03/27/2020 at 0800  . Cholecalciferol (QC VITAMIN D3) 50 MCG (2000 UT) TABS Take 2,000 Units by mouth daily.   03/27/2020 at 0800  . docusate sodium (COLACE) 100 MG capsule Take 100 mg by mouth 2 (two) times daily. With breakfast & with supper   03/27/2020 at 0800  . ELDERBERRY PO Take 2 tablets by mouth daily. Gummy  03/27/2020 at 0800  . Multiple Vitamins-Minerals (ADULT GUMMY PO) Take 2 tablets by mouth daily.   03/27/2020 at 0800  . Potassium 99 MG TABS Take 99 mg by mouth daily.    03/27/2020 at 0800  . vitamin B-12 (CYANOCOBALAMIN) 1000 MCG tablet Take 1,000 mcg by mouth daily.   03/27/2020 at 0800  . aspirin EC 81 MG tablet Take 81 mg by mouth every evening.    03/26/2020 at 1830  . HYDROcodone-acetaminophen (NORCO/VICODIN) 5-325 MG tablet Take 1 tablet by mouth at bedtime.  (Patient not taking: No sig reported)     .  ibuprofen (ADVIL) 200 MG tablet Take 200 mg by mouth 2 (two) times daily as needed (pain.).    unknown at prn  . Lido-Capsaicin-Men-Methyl Sal (MEDI-PATCH-LIDOCAINE EX) Apply 1 patch topically daily. Applied to back   unknown at prn  . naproxen sodium (ALEVE) 220 MG tablet Take 440 mg by mouth at bedtime.    03/26/2020 at 2200  . Turmeric 500 MG TABS Take 500 mg by mouth at bedtime. w/Ginger   03/26/2020 at 1830   Social History   Socioeconomic History  . Marital status: Widowed    Spouse name: Not on file  . Number of children: Not on file  . Years of education: Not on file  . Highest education level: Not on file  Occupational History  . Not on file  Tobacco Use  . Smoking status: Former Smoker    Packs/day: 1.00    Years: 25.00    Pack years: 25.00    Types: Cigarettes    Quit date: 03/22/1958    Years since quitting: 62.0  . Smokeless tobacco: Never Used  Vaping Use  . Vaping Use: Never used  Substance and Sexual Activity  . Alcohol use: No  . Drug use: No  . Sexual activity: Never  Other Topics Concern  . Not on file  Social History Narrative  . Not on file   Social Determinants of Health   Financial Resource Strain: Not on file  Food Insecurity: Not on file  Transportation Needs: Not on file  Physical Activity: Not on file  Stress: Not on file  Social Connections: Not on file  Intimate Partner Violence: Not on file    Family History  Problem Relation Age of Onset  . Hypertension Mother       Review of systems complete and found to be negative unless listed above      PHYSICAL EXAM  General: Asleep, in no acute distress HEENT:  Normocephalic and atramatic Neck:  No JVD.  Lungs: Clear bilaterally to auscultation and percussion. Heart: HRRR . Normal S1 and S2 without gallops or murmurs.  Abdomen: Bowel sounds are positive, abdomen soft and non-tender  Msk:  Back normal. Normal strength and tone for age. Extremities: No clubbing, cyanosis or edema.    Neuro: Asleep  Psych:  Asleep  Labs:   Lab Results  Component Value Date   WBC 8.1 03/30/2020   HGB 9.3 (L) 03/30/2020   HCT 28.6 (L) 03/30/2020   MCV 93.5 03/30/2020   PLT 408 (H) 03/30/2020    Recent Labs  Lab 03/30/20 0505  NA 142  K 3.4*  CL 106  CO2 25  BUN 22  CREATININE 0.91  CALCIUM 9.0  PROT 6.0*  BILITOT 0.9  ALKPHOS 54  ALT 11  AST 16  GLUCOSE 105*   Lab Results  Component Value Date   CKMB 1.3 05/06/2013   TROPONINI <  0.02 05/06/2013   No results found for: CHOL No results found for: HDL No results found for: LDLCALC No results found for: TRIG No results found for: CHOLHDL No results found for: LDLDIRECT    Radiology: DG Chest 1 View  Result Date: 03/28/2020 CLINICAL DATA:  Altered mental status EXAM: CHEST  1 VIEW COMPARISON:  05/27/2019 FINDINGS: Heart is normal size. Diffuse interstitial prominence throughout the lungs, favor chronic lung disease. No effusions. Aortic atherosclerosis. No acute bony abnormality. IMPRESSION: Diffuse interstitial prominence throughout the lungs, favor chronic interstitial lung disease. Alternatively, this could reflect viral/atypical infection. Electronically Signed   By: Rolm Baptise M.D.   On: 03/28/2020 00:23   CT Head Wo Contrast  Result Date: 03/28/2020 CLINICAL DATA:  Mental status changes EXAM: CT HEAD WITHOUT CONTRAST TECHNIQUE: Contiguous axial images were obtained from the base of the skull through the vertex without intravenous contrast. COMPARISON:  None. 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 Chronic bilateral basal ganglia lacunar infarcts. There is atrophy and chronic small vessel disease changes. No acute intracranial abnormality. Specifically, no hemorrhage, hydrocephalus, mass lesion, acute infarction, or significant intracranial injury. Vascular: No hyperdense vessel or unexpected calcification. Skull: No acute calvarial abnormality. Sinuses/Orbits: Air-fluid level in the right  maxillary sinus. 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 Atrophy, chronic microvascular disease. No acute intracranial abnormality. Electronically Signed   By: Rolm Baptise M.D.   On: 03/28/2020 00:21   CT Angio Chest PE W and/or Wo Contrast  Result Date: 03/28/2020 CLINICAL DATA:  Lethargy, possible pulmonary embolism, abdominal pain EXAM: CT ANGIOGRAPHY CHEST CT ABDOMEN AND PELVIS WITH CONTRAST TECHNIQUE: Multidetector CT imaging of the chest was performed using the standard protocol during bolus administration of intravenous contrast. Multiplanar CT image reconstructions and MIPs were obtained to evaluate the vascular anatomy. Multidetector CT imaging of the abdomen and pelvis was performed using the standard protocol during bolus administration of intravenous contrast. CONTRAST:  153mL OMNIPAQUE IOHEXOL 350 MG/ML SOLN COMPARISON:  MR lumbar spine 12/20/2019, fluoroscopy lumbar spine 12/23/2019, 12/14/2015, CT abdomen pelvis 12/28/1998 (report only) FINDINGS: CTA CHEST FINDINGS Cardiovascular: Satisfactory opacification of pulmonary arteries. No large central or lobar pulmonary arterial filling defects are identified. Respiratory motion may limit detection of smaller, subsegmental pulmonary emboli. Borderline dilatation of the the pulmonary trunk. Cardiac size is top normal. Three-vessel coronary artery atherosclerosis is noted. Trace pericardial fluid including fluid within the pericardial recesses. Ascending thoracic aortic dilatation to 4.3 cm. Atherosclerotic plaque throughout the thoracic aorta. No acute luminal abnormality. No focal periaortic stranding or hemorrhage. Cerebral region of the brachiocephalic and left common carotid arteries. The left vertebral artery arises directly from the aortic arch between this shared origin and the left subclavian artery. Proximal great vessels are otherwise unremarkable. Mediastinum/Nodes: Fluid in the  pericardial recesses. No mediastinal gas. Normal thyroid gland and thoracic inlet. No acute abnormality of the esophagus. Secretions noted in the trachea, posterior bowing likely related to imaging during exhalation. No worrisome mediastinal, hilar or axillary adenopathy. Shotty low-attenuation subcentimeter mediastinal and hilar lymph nodes are favored to be edematous or reactive. Lungs/Pleura: Diffuse airways thickening and scattered secretions. Diffuse interlobular septal thickening, pulmonary vascular redistribution and hazy ground-glass opacity particularly within the more dependent portions of the lungs are findings most compatible with interstitial and early alveolar edema. Small to moderate bilateral pleural effusions are present, right slightly greater than left. Adjacent passive atelectatic changes. No pneumothorax. Musculoskeletal: T12 compression deformity with vertebroplasty changes, with 20%  residual height loss not significantly changed from fluoroscopic images at the time of procedure 12/23/2019. No new acute osseous abnormalities or suspicious lytic or blastic lesions are seen. No worrisome chest wall masses or lesions. Review of the MIP images confirms the above findings. CT ABDOMEN and PELVIS FINDINGS Hepatobiliary: Few punctate parenchymal calcifications within the liver, largest in segment 4) 5/21) possibly vascular versus prior granulomatous disease. No worrisome focal liver lesions. Smooth liver surface contour. Normal hepatic attenuation. Prior cholecystectomy. Marked distension of the intra and extrahepatic biliary tree with the common bile duct measuring up to 15 mm in diameter, greater than expected for typical post cholecystectomy reservoir effect and senescent change. No visible intraductal gallstones. Pancreas: No pancreatic ductal dilatation or surrounding inflammatory changes. Spleen: Heterogeneous enhancement the spleen is nonspecific and possibly related to arterial phase of  contrast imaging though a slightly more wedge-shaped area of hypoattenuation is seen in the posterior spleen (5/20) which could reflect an early splenic infarct. Adrenals/Urinary Tract: No concerning adrenal nodules or masses. There is a hyperdense cystic appearing lesion measuring up to 2.8 cm in the lower pole left kidney though demonstrate some conspicuous increase in attenuation of approximately 15 Hounsfield units on the delayed phase imaging (5/38, 10/20) more simple appearing cysts are seen elsewhere in both kidneys with a slightly larger minimally complex cyst with thin internal septations arising from the upper pole right kidney measuring up to 2 cm in size (5/38) no other concerning renal lesions. Kidneys enhance and excrete symmetrically. No urolithiasis or hydronephrosis. Excreted contrast material is noted within the collecting system from separate contrast bolus administration. Urinary bladder is otherwise unremarkable. Stomach/Bowel: Sliding-type hiatal hernia. Stomach and duodenum are unremarkable. No small bowel thickening or dilatation. The appendix is surgically absent. No colonic dilatation or wall thickening. Scattered colonic diverticula without focal inflammation to suggest diverticulitis. Some mild segmental thickening of the sigmoid in a region of numerous colonic diverticula without acute pericolonic or focal diverticular inflammation could reflect sequela of prior inflammation. Mild circumferential anorectal thickening with perirectal fat stranding is nonspecific though could correlate for features of proctitis. Vascular/Lymphatic: Atherosclerotic calcifications within the abdominal aorta and branch vessels. No aneurysm or ectasia. No enlarged abdominopelvic lymph nodes. Reproductive: Prostate appears surgically absent. Other: Perirectal hazy stranding, as above. Postsurgical changes from prior low vertical midline incision. Mild body wall edema. No abdominopelvic free air or fluid.  Musculoskeletal: Remote compression deformity L2 with evidence of prior vertebroplasty in slight extravasation of cement into the L3-4 disc space. Up to 70% residual height loss centrally. Stepwise likely degenerative retrolisthesis L2-L5. Additional transitional lumbosacral anatomy, with what is likely a rudimentary disc between the S1 and S2 levels. The osseous structures appear diffusely demineralized which may limit detection of small or nondisplaced fractures. No acute osseous abnormality or suspicious osseous lesion. Prior left hip arthroplasty with streak artifact. Hardware is in expected alignment without complication. Review of the MIP images confirms the above findings. IMPRESSION: 1. No evidence of large central or lobar pulmonary arterial filling defects to suggest pulmonary embolism. Respiratory motion may limit detection of smaller, subsegmental pulmonary emboli. 2. Features of congestive heart failure with interstitial and early alveolar edema, small to moderate bilateral pleural effusions. 3. Ascending thoracic aortic dilatation to 4.3 cm. No evidence of dissection. Guidelines recommend annual imaging followup by CTA or MRA. This recommendation follows 2010 ACCF/AHA/AATS/ACR/ASA/SCA/SCAI/SIR/STS/SVM Guidelines for the Diagnosis and Management of Patients with Thoracic Aortic Disease. Circulation. 2010; [id number]. Aortic aneurysm NOS (ICD10-I71.9) 4. Heterogeneous enhancement the spleen  is nonspecific and possibly related to arterial phase of contrast imaging though a slightly more wedge-shaped area of hypoattenuation is seen in the posterior spleen could reflect an early splenic infarct. 5. Mild circumferential anorectal thickening with perirectal fat stranding is nonspecific though could correlate for features of proctitis, and consider direct visualization. 6. Segmental thickening of the mid sigmoid colon in a region of numerous colonic diverticula the albeit without acute inflammation at this  time, may reflect sequela of prior diverticular inflammation. 7. Hyperdense cystic appearing lesion measuring up to 2.8 cm in the lower pole left kidney though demonstrate some conspicuous increase in attenuation of approximately 15 Hounsfield units on the delayed phase imaging. Recommend further evaluation with nonemergent renal ultrasound for further evaluation. 8. Sliding-type hiatal hernia. 9. Compression deformities and vertebroplasty changes T12 and L2. Electronically Signed   By: Lovena Le M.D.   On: 03/28/2020 05:03   MR BRAIN WO CONTRAST  Result Date: 03/28/2020 CLINICAL DATA:  Mental status change, unknown cause. EXAM: MRI HEAD WITHOUT CONTRAST TECHNIQUE: Multiplanar, multiecho pulse sequences of the brain and surrounding structures were obtained without intravenous contrast. COMPARISON:  CT head 03/28/2020 FINDINGS: Brain: Small acute or early subacute infarct in the right upper pons (see series 5 and 6, image 18). Mild associated edema without mass effect. Remote right frontal lobe infarct with encephalomalacia and surrounding gliosis. Multiple dilated perivascular spaces versus lacunar infarcts in bilateral basal ganglia. Additional scattered T2/FLAIR hyperintensity within the white matter, compatible with chronic microvascular ischemic disease. Moderate diffuse cerebral volume loss with ex vacuo ventricular dilation. No acute hemorrhage. No hydrocephalus. No midline shift. No extra-axial fluid collection. No mass lesion. Vascular: Abnormal flow related signal within the visualized right vertebral artery, concerning for stenosis or occlusion. Skull and upper cervical spine: Diffuse T1 hypointensity of the marrow. Sinuses/Orbits: Inferior right maxillary sinus mucosal thickening. Unremarkable orbits. Other: Small right mastoid effusion. IMPRESSION: 1. Small acute or early subacute infarct in the right upper pons. Mild associated edema without mass effect. 2. Abnormal flow related signal within the  visualized right vertebral artery, concerning for occlusion or significant stenosis. Recommend CTA to further evaluate. 3. Remote right frontal lobe infarct and dilated perivascular spaces versus remote lacunar infarcts in bilateral basal ganglia. 4. Moderate chronic microvascular ischemic disease and generalized cerebral volume loss. Diffuse 5. Diffuse T1 hypointensity of the marrow, which is nonspecific and could relate to chronic hypoxia (such as in smokers), chronic anemia, or lymphoproliferative disorder. Conclusions #1 and #2 were discussed with Dr. Jaclynn Guarneri Via telephone at 12:25 p.m. Electronically Signed   By: Margaretha Sheffield MD   On: 03/28/2020 12:31   CT ABDOMEN PELVIS W CONTRAST  Result Date: 03/28/2020 CLINICAL DATA:  Lethargy, possible pulmonary embolism, abdominal pain EXAM: CT ANGIOGRAPHY CHEST CT ABDOMEN AND PELVIS WITH CONTRAST TECHNIQUE: Multidetector CT imaging of the chest was performed using the standard protocol during bolus administration of intravenous contrast. Multiplanar CT image reconstructions and MIPs were obtained to evaluate the vascular anatomy. Multidetector CT imaging of the abdomen and pelvis was performed using the standard protocol during bolus administration of intravenous contrast. CONTRAST:  116mL OMNIPAQUE IOHEXOL 350 MG/ML SOLN COMPARISON:  MR lumbar spine 12/20/2019, fluoroscopy lumbar spine 12/23/2019, 12/14/2015, CT abdomen pelvis 12/28/1998 (report only) FINDINGS: CTA CHEST FINDINGS Cardiovascular: Satisfactory opacification of pulmonary arteries. No large central or lobar pulmonary arterial filling defects are identified. Respiratory motion may limit detection of smaller, subsegmental pulmonary emboli. Borderline dilatation of the the pulmonary trunk. Cardiac size is top normal.  Three-vessel coronary artery atherosclerosis is noted. Trace pericardial fluid including fluid within the pericardial recesses. Ascending thoracic aortic dilatation to 4.3 cm.  Atherosclerotic plaque throughout the thoracic aorta. No acute luminal abnormality. No focal periaortic stranding or hemorrhage. Cerebral region of the brachiocephalic and left common carotid arteries. The left vertebral artery arises directly from the aortic arch between this shared origin and the left subclavian artery. Proximal great vessels are otherwise unremarkable. Mediastinum/Nodes: Fluid in the pericardial recesses. No mediastinal gas. Normal thyroid gland and thoracic inlet. No acute abnormality of the esophagus. Secretions noted in the trachea, posterior bowing likely related to imaging during exhalation. No worrisome mediastinal, hilar or axillary adenopathy. Shotty low-attenuation subcentimeter mediastinal and hilar lymph nodes are favored to be edematous or reactive. Lungs/Pleura: Diffuse airways thickening and scattered secretions. Diffuse interlobular septal thickening, pulmonary vascular redistribution and hazy ground-glass opacity particularly within the more dependent portions of the lungs are findings most compatible with interstitial and early alveolar edema. Small to moderate bilateral pleural effusions are present, right slightly greater than left. Adjacent passive atelectatic changes. No pneumothorax. Musculoskeletal: T12 compression deformity with vertebroplasty changes, with 20% residual height loss not significantly changed from fluoroscopic images at the time of procedure 12/23/2019. No new acute osseous abnormalities or suspicious lytic or blastic lesions are seen. No worrisome chest wall masses or lesions. Review of the MIP images confirms the above findings. CT ABDOMEN and PELVIS FINDINGS Hepatobiliary: Few punctate parenchymal calcifications within the liver, largest in segment 4) 5/21) possibly vascular versus prior granulomatous disease. No worrisome focal liver lesions. Smooth liver surface contour. Normal hepatic attenuation. Prior cholecystectomy. Marked distension of the intra  and extrahepatic biliary tree with the common bile duct measuring up to 15 mm in diameter, greater than expected for typical post cholecystectomy reservoir effect and senescent change. No visible intraductal gallstones. Pancreas: No pancreatic ductal dilatation or surrounding inflammatory changes. Spleen: Heterogeneous enhancement the spleen is nonspecific and possibly related to arterial phase of contrast imaging though a slightly more wedge-shaped area of hypoattenuation is seen in the posterior spleen (5/20) which could reflect an early splenic infarct. Adrenals/Urinary Tract: No concerning adrenal nodules or masses. There is a hyperdense cystic appearing lesion measuring up to 2.8 cm in the lower pole left kidney though demonstrate some conspicuous increase in attenuation of approximately 15 Hounsfield units on the delayed phase imaging (5/38, 10/20) more simple appearing cysts are seen elsewhere in both kidneys with a slightly larger minimally complex cyst with thin internal septations arising from the upper pole right kidney measuring up to 2 cm in size (5/38) no other concerning renal lesions. Kidneys enhance and excrete symmetrically. No urolithiasis or hydronephrosis. Excreted contrast material is noted within the collecting system from separate contrast bolus administration. Urinary bladder is otherwise unremarkable. Stomach/Bowel: Sliding-type hiatal hernia. Stomach and duodenum are unremarkable. No small bowel thickening or dilatation. The appendix is surgically absent. No colonic dilatation or wall thickening. Scattered colonic diverticula without focal inflammation to suggest diverticulitis. Some mild segmental thickening of the sigmoid in a region of numerous colonic diverticula without acute pericolonic or focal diverticular inflammation could reflect sequela of prior inflammation. Mild circumferential anorectal thickening with perirectal fat stranding is nonspecific though could correlate for  features of proctitis. Vascular/Lymphatic: Atherosclerotic calcifications within the abdominal aorta and branch vessels. No aneurysm or ectasia. No enlarged abdominopelvic lymph nodes. Reproductive: Prostate appears surgically absent. Other: Perirectal hazy stranding, as above. Postsurgical changes from prior low vertical midline incision. Mild body wall edema. No abdominopelvic  free air or fluid. Musculoskeletal: Remote compression deformity L2 with evidence of prior vertebroplasty in slight extravasation of cement into the L3-4 disc space. Up to 70% residual height loss centrally. Stepwise likely degenerative retrolisthesis L2-L5. Additional transitional lumbosacral anatomy, with what is likely a rudimentary disc between the S1 and S2 levels. The osseous structures appear diffusely demineralized which may limit detection of small or nondisplaced fractures. No acute osseous abnormality or suspicious osseous lesion. Prior left hip arthroplasty with streak artifact. Hardware is in expected alignment without complication. Review of the MIP images confirms the above findings. IMPRESSION: 1. No evidence of large central or lobar pulmonary arterial filling defects to suggest pulmonary embolism. Respiratory motion may limit detection of smaller, subsegmental pulmonary emboli. 2. Features of congestive heart failure with interstitial and early alveolar edema, small to moderate bilateral pleural effusions. 3. Ascending thoracic aortic dilatation to 4.3 cm. No evidence of dissection. Guidelines recommend annual imaging followup by CTA or MRA. This recommendation follows 2010 ACCF/AHA/AATS/ACR/ASA/SCA/SCAI/SIR/STS/SVM Guidelines for the Diagnosis and Management of Patients with Thoracic Aortic Disease. Circulation. 2010; [id number]. Aortic aneurysm NOS (ICD10-I71.9) 4. Heterogeneous enhancement the spleen is nonspecific and possibly related to arterial phase of contrast imaging though a slightly more wedge-shaped area of  hypoattenuation is seen in the posterior spleen could reflect an early splenic infarct. 5. Mild circumferential anorectal thickening with perirectal fat stranding is nonspecific though could correlate for features of proctitis, and consider direct visualization. 6. Segmental thickening of the mid sigmoid colon in a region of numerous colonic diverticula the albeit without acute inflammation at this time, may reflect sequela of prior diverticular inflammation. 7. Hyperdense cystic appearing lesion measuring up to 2.8 cm in the lower pole left kidney though demonstrate some conspicuous increase in attenuation of approximately 15 Hounsfield units on the delayed phase imaging. Recommend further evaluation with nonemergent renal ultrasound for further evaluation. 8. Sliding-type hiatal hernia. 9. Compression deformities and vertebroplasty changes T12 and L2. Electronically Signed   By: Lovena Le M.D.   On: 03/28/2020 05:03   ECHOCARDIOGRAM COMPLETE  Result Date: 03/28/2020    ECHOCARDIOGRAM REPORT   Patient Name:   LENN SRINIVASAN Date of Exam: 03/28/2020 Medical Rec #:  [id number]           Height:       69.0 in Accession #:    [id number]          Weight:       138.9 lb Date of Birth:  1920/06/08          BSA:          1.769 m Patient Age:    23 years            BP:           119/81 mmHg Patient Gender: M                   HR:           119 bpm. Exam Location:  [facility] Procedure: 2D Echo, Color Doppler and Cardiac Doppler Indications:     CHF-acute diastolic [id number]  History:         Patient has no prior history of Echocardiogram examinations.                  Arrythmias:Atrial Fibrillation; Risk Factors:Hypertension.  Sonographer:     Sherrie Sport RDCS (AE) Referring Phys:  [place] [id number] Diagnosing Phys: Yolonda Kida MD IMPRESSIONS  1. Inferior  hypokinesis.  2. Left ventricular ejection fraction, by estimation, is 50 to 55%. The left ventricle has low normal function. The left ventricle demonstrates  regional wall motion abnormalities (see scoring diagram/findings for description). Left ventricular diastolic  parameters were normal.  3. Right ventricular systolic function is normal. The right ventricular size is normal.  4. The mitral valve is normal in structure. Mild mitral valve regurgitation.  5. The aortic valve is normal in structure. Aortic valve regurgitation is trivial. FINDINGS  Left Ventricle: Left ventricular ejection fraction, by estimation, is 50 to 55%. The left ventricle has low normal function. The left ventricle demonstrates regional wall motion abnormalities. The left ventricular internal cavity size was normal in size. There is no left ventricular hypertrophy. Left ventricular diastolic parameters were normal. Right Ventricle: The right ventricular size is normal. No increase in right ventricular wall thickness. Right ventricular systolic function is normal. Left Atrium: Left atrial size was normal in size. Right Atrium: Right atrial size was normal in size. Pericardium: There is no evidence of pericardial effusion. Mitral Valve: The mitral valve is normal in structure. Mild mitral valve regurgitation. Tricuspid Valve: The tricuspid valve is normal in structure. Tricuspid valve regurgitation is mild. Aortic Valve: The aortic valve is normal in structure. Aortic valve regurgitation is trivial. Aortic valve mean gradient measures 2.0 mmHg. Aortic valve peak gradient measures 3.1 mmHg. Aortic valve area, by VTI measures 3.96 cm. Pulmonic Valve: The pulmonic valve was grossly normal. Pulmonic valve regurgitation is not visualized. Aorta: The ascending aorta was not well visualized. IAS/Shunts: No atrial level shunt detected by color flow Doppler. Additional Comments: Inferior hypokinesis.  LEFT VENTRICLE PLAX 2D LVIDd:         3.90 cm LVIDs:         2.80 cm LV PW:         1.20 cm LV IVS:        1.30 cm LVOT diam:     2.30 cm LV SV:         40 LV SV Index:   23 LVOT Area:     4.15 cm  RIGHT  VENTRICLE RV Basal diam:  2.40 cm RV S prime:     17.40 cm/s TAPSE (M-mode): 3.0 cm LEFT ATRIUM             Index       RIGHT ATRIUM          Index LA diam:        4.40 cm 2.49 cm/m  RA Area:     7.20 cm LA Vol (A2C):   39.5 ml 22.32 ml/m RA Volume:   12.30 ml 6.95 ml/m LA Vol (A4C):   17.1 ml 9.66 ml/m LA Biplane Vol: 27.6 ml 15.60 ml/m  AORTIC VALVE                   PULMONIC VALVE AV Area (Vmax):    2.70 cm    PV Vmax:        0.38 m/s AV Area (Vmean):   3.02 cm    PV Peak grad:   0.6 mmHg AV Area (VTI):     3.96 cm    RVOT Peak grad: 2 mmHg AV Vmax:           87.50 cm/s AV Vmean:          55.800 cm/s AV VTI:            0.101 m AV Peak Grad:  3.1 mmHg AV Mean Grad:      2.0 mmHg LVOT Vmax:         56.80 cm/s LVOT Vmean:        40.600 cm/s LVOT VTI:          0.096 m LVOT/AV VTI ratio: 0.95  AORTA Ao Root diam: 3.77 cm MITRAL VALVE               TRICUSPID VALVE MV Area (PHT): 3.84 cm    TR Peak grad:   8.6 mmHg MV Decel Time: 197 msec    TR Vmax:        147.00 cm/s MV E velocity: 77.43 cm/s                            SHUNTS                            Systemic VTI:  0.10 m                            Systemic Diam: 2.30 cm Dwayne D Callwood MD Electronically signed by Yolonda Kida MD Signature Date/Time: 03/28/2020/4:43:57 PM    Final     EKG: Normal sinus rhythm, rate of 80bpm, with a 1st degree AV block; no obvious evidence of acute ischemia   ASSESSMENT AND PLAN:  1.  Atrial fibrillation   -In NSR, currently not on any antiarrhythmics or rate lowering medications   -Patient has deferred anticoagulation in the past which is reasonable as he's currently in NSR, has a relatively high fall/bleed risk   2.  Elevated troponin/abnormal echo   -In the absence of chest pain and preserved LV systolic function, no plan for invasive workup or further cardiac imaging/testing at this time - daughter agrees  -Cardiology signing off; call with questions or concerns   3.  CVA   -No cardiac source of  emboli, remains in NSR   The history, physical exam findings, and plan of care were all discussed with Dr. Bartholome Bill, and all decision making was made in collaboration.   Signed: Avie Arenas PA-C 03/30/2020, 4:06 PM

## 2020-03-30 NOTE — Progress Notes (Signed)
Physical Therapy Treatment Patient Details Name: Gerald [name] MRN: [id number] DOB: July 10, 1920 Today's Date: 03/30/2020    History of Present Illness Gerald [name] is a 37TDD who [facility] cancer center on 1/10 due to recent increased lethargy and low Hgb. Per DTR pt was treated for a cold around Christmas c ABX, since struggled with AMB, been in bed for 3 weeks.  PMH: anemia, AF on aspirin, hemochromatosis, HTN, Left anterior THA March '21 s/po fall, multiple lumbar compression fractures s/p lordoplasty x1. 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 Mild associated edema without mass effect. Abnormal flow related signal within the visualized right vertebral artery, concerning for occlusion or significant stenosis. Recommend CTA to further evaluate. Remote right frontal lobe infarct and dilated perivascular spaces versus remote lacunar infarcts in bilateral basal ganglia." Neurology note from 1/11 also mentions "May have underlying Parkinsonism given cog wheeling and tremor seen on examination. Family relates that prior to March, he had developed a mild shuffling gait."    PT Comments    Pt in bed awake, alert, interactive. Pt participates in bed level exercises, most are severely weak, some cause exacerbation of insidious Right flank pain. Pt is easily distracted need cues frequently to attend to exercises. RUE notable more weak than Left with all UE exercises. Pt unable to achieve TKE with SAQ by >20 degrees. Author identified loose bowel in bed, made RN and NA aware to assist pt with cleanup needs.     Follow Up Recommendations  Home health PT;Supervision for mobility/OOB;Supervision - Intermittent     Equipment Recommendations  Other (comment) (mechanical lift at home)    Recommendations for Other Services       Precautions / Restrictions Precautions Precautions: Fall Restrictions Weight Bearing  Restrictions: No    Mobility  Bed Mobility               General bed mobility comments: deferred due to weakness, bowel in bed  Transfers                    Ambulation/Gait                 Stairs             Wheelchair Mobility    Modified Rankin (Stroke Patients Only)       Balance                                            Cognition Arousal/Alertness: Awake/alert Behavior During Therapy: WFL for tasks assessed/performed Overall Cognitive Status: Within Functional Limits for tasks assessed                                 General Comments: More alert today; HOH, easily distracted.      Exercises Other Exercises Other Exercises: Ankle pumps, heel slides, hip ABD/ADD, composite extension, SAQ (all bilat 1x10 AA/ROM) Other Exercises: BUE overhead reach, elbow extension, elbow flexion, A/Resisted ROM very weak in general, takes minimal resistance 1x10 bilat    General Comments        Pertinent Vitals/Pain Pain Assessment: Faces Faces Pain Scale: Hurts even more Pain Location: Right flank pain with some leg movements or passive repositioning in bed Pain Descriptors /  Indicators: Discomfort;Grimacing;Guarding Pain Intervention(s): Limited activity within patient's tolerance;Monitored during session    Home Living                      Prior Function            PT Goals (current goals can now be found in the care plan section) Acute Rehab PT Goals Patient Stated Goal: regain ability to help family members with bed mobility and transfers, maybe take a few steps PT Goal Formulation: With family Time For Goal Achievement: 04/12/20 Potential to Achieve Goals: Poor Progress towards PT goals: Progressing toward goals    Frequency    7X/week      PT Plan Current plan remains appropriate    Co-evaluation              AM-PAC PT "6 Clicks" Mobility   Outcome Measure  Help needed  turning from your back to your side while in a flat bed without using bedrails?: Total Help needed moving from lying on your back to sitting on the side of a flat bed without using bedrails?: Total Help needed moving to and from a bed to a chair (including a wheelchair)?: Total Help needed standing up from a chair using your arms (e.g., wheelchair or bedside chair)?: Total Help needed to walk in hospital room?: Total Help needed climbing 3-5 steps with a railing? : Total 6 Click Score: 6    End of Session Equipment Utilized During Treatment: Gait belt Activity Tolerance: Patient tolerated treatment well;Patient limited by fatigue;Patient limited by pain;Other (comment) (bowel movement) Patient left: in bed;with family/visitor present Nurse Communication: Mobility status PT Visit Diagnosis: Muscle weakness (generalized) (M62.81);Other abnormalities of gait and mobility (R26.89);Difficulty in walking, not elsewhere classified (R26.2)     Time: 8185-6314 PT Time Calculation (min) (ACUTE ONLY): 24 min  Charges:  $Therapeutic Exercise: 23-37 mins                     10:49 AM, 03/30/20 Etta Grandchild, PT, DPT Physical Therapist - [facility]  [phone] ([place])    [name] C 03/30/2020, 10:47 AM

## 2020-03-30 NOTE — Progress Notes (Signed)
PROGRESS NOTE    Gerald [name]  BJS:[id number] DOB: 01-26-1921 DOA: 03/28/2020 PCP: [name], MD   Brief Narrative:  This 87 yrs old male with PMH significant for HTN, myelodysplastic syndrome, moderate mitral regurgitation, chronic anemia, remote history of prostate or testicular cancer, paroxysmal A. fib who presents with generalized weakness and altered mental status.  History is obtained from daughter since the patient was confused.  Family reports patient had hip replaced little under a year ago and since then family noticed gradual decline.  Patient has been less mobile with less energy and cognitive decline.  He was recently treated for respiratory infection by his primary care physician with antibiotics 3 weeks ago.  Patient has worsening mental status after he completed antibiotics.  Patient was sent from oncologist office for altered mental status.  The abdomen shows finding consistent with proctitis, questionable splenic infarct , Patient started on Zosyn.  MRI shows acute stroke.   Assessment & Plan:   Active Problems:   Anemia   A-fib (HCC)   Essential (primary) hypertension   Hemochromatosis   MDS (myelodysplastic syndrome), low grade (HCC)   Acute proctitis   Acute encephalopathy   Pressure injury of sacral region, stage 2 (HCC)   CVA (cerebral vascular accident) ([place])   Acute encephalopathy could be secondary to acute stroke> improving Etiology unclear at admission. CT Head w/o acute findings. May be infection given mild leukocytosis but no localizing symptoms.  Recent URI but that appears mostly resolved.  CT Abdomen shows possible proctitis maybe 2/2 chronic constipation though report of stooling regularly as of late.  Urinalysis not suggestive of infection. CT does show some signs of fluid overload and breathing comfortably on room air.  Covid negative. Normal glucose, no significant electrolyte abnormalities. No signs hepatic dysfunction or uremia.  Not  on meds associated with this. Could represent end of life condition in this very elderly patient. Full code for now but daughter will discuss w/ other family members, She does think at this point patient would probably want to be DNR MRI Brain: Small acute or early subacute infarct in the right upper pons. Mild associated edema without mass effect. MRSA nares negative, will continue zosyn for now, presume would treat acute proctatitis.  Blood cultures no growth so far, baseline procalcitonin normal.  Likely stop abx in next 24-48 hours. Lactic acid 1.5 - slp swallow eval, dysphagia 2 diet to start.  Elevated troponin: Interstitial edema seen on CT of chest: Patient does have history of moderate mitral regurg. Seen on CTA. No PE. ED gave lasix x1.  Patient appears euvolemic. Echocardiogram shows inferior hypokinesis, regional wall motion abnormalities EF 50 to 55%. Patient denies any chest pain, EKG no ST-T wave changes. Cardiology consulted,  awaiting recommendation.   Myelodysplastic syndrome Chronic anemia Mild per onc. Here hgb is actually higher than baseline, perhaps 2/2 mild dehydration - f/u smear  Hypokalemia : Mild, 3.4, replacement in progress,  recheck labs in AM.  Atrial fibrillation, paroxysmal. Not anticoagualted due to fall risk and age.  Hold home aspirin, benefit at this point doubtful  Stage 2 pressure ulcer - nursing wound care ordered  splenic infarct? possible splenic infarct seen on CTA. No report of abdominal pain.  - will monitor for now  Proctitis? Does have hx constipation. Doubtful would be cause of encephalopathy if present Continue zosyn for now. - consider GI consult  Cystic lesion in kidney - outpt renal u/s, consider  HTN: Blood pressure remains normal Continue to  monitor blood pressure off medication  History constipation - cont home colace   DVT prophylaxis: Lovenox Code Status: Full code Family Communication: Daughters  were at bedside  Disposition Plan:   Status is: Inpatient  Remains inpatient appropriate because:Inpatient level of care appropriate due to severity of illness   Dispo: The patient is from: Home              Anticipated d/c is to: Home              Anticipated d/c date is: > 3 days              Patient currently is not medically stable to d/c.  Consultants:   None  Procedures:  Antimicrobials:  Anti-infectives (From admission, onward)   Start     Dose/Rate Route Frequency Ordered Stop   03/28/20 1400  piperacillin-tazobactam (ZOSYN) IVPB 3.375 g        3.375 g 12.5 mL/hr over 240 Minutes Intravenous Every 8 hours 03/28/20 0858     03/28/20 0530  piperacillin-tazobactam (ZOSYN) IVPB 3.375 g        3.375 g 100 mL/hr over 30 Minutes Intravenous  Once 03/28/20 0517 03/28/20 [id number]      Subjective: Patient was seen and examined at bedside.  Overnight events noted.  Patient seems alert and oriented x1.   Family states patient is at baseline mental status now.  Patient has been having breakfast,  denies any difficulty swallowing.  Objective: Vitals:   03/30/20 0300 03/30/20 0500 03/30/20 0721 03/30/20 1111  BP:  (!) 171/66 (!) 161/75 (!) 161/80  Pulse:  77 66 71  Resp:   17 18  Temp:  97.7 F (36.5 C) 97.7 F (36.5 C) 98.2 F (36.8 C)  TempSrc:  Oral Oral   SpO2:  97% 95% 98%  Weight: 65.6 kg     Height:        Intake/Output Summary (Last 24 hours) at 03/30/2020 1544 Last data filed at 03/30/2020 1350 Gross per 24 hour  Intake 2553.91 ml  Output 1542 ml  Net 1011.91 ml   Filed Weights   03/27/20 1506 03/30/20 0300  Weight: 63 kg 65.6 kg    Examination:  General exam: Appears calm and comfortable, not in any acute distress. Respiratory system: Clear to auscultation. Respiratory effort normal. Cardiovascular system: S1 & S2 heard, RRR. No JVD, murmurs, rubs, gallops or clicks. No pedal edema. Gastrointestinal system: Abdomen is nondistended, soft and nontender.  No organomegaly or masses felt. Normal bowel sounds heard. Central nervous system: Alert and oriented. No focal neurological deficits. Extremities: Symmetric 5 x 5 power.  No edema, no cyanosis, no clubbing. Skin: No rashes, lesions or ulcers Psychiatry: Judgement and insight appear normal. Mood & affect appropriate.     Data Reviewed: I have personally reviewed following labs and imaging studies  CBC: Recent Labs  Lab 03/27/20 1514 03/29/20 0609 03/30/20 0505  WBC 11.0* 9.3 8.1  NEUTROABS 7.6  --   --   HGB 10.3* 9.4* 9.3*  HCT 32.0* 28.9* 28.6*  MCV 94.7 93.8 93.5  PLT 444* 383 [id number]*   Basic Metabolic Panel: Recent Labs  Lab 03/27/20 1514 03/28/20 0914 03/29/20 0609 03/30/20 0505  NA 140  --  138 142  K 3.4*  --  3.5 3.4*  CL 103  --  102 106  CO2 26  --  26 25  GLUCOSE 130*  --  120* 105*  BUN 22  --  23 22  CREATININE 0.84  --  0.98 0.91  CALCIUM 9.8  --  8.9 9.0  MG  --  1.9  --  1.8  PHOS  --   --   --  3.8   GFR: Estimated Creatinine Clearance: 43.1 mL/min (by C-G formula based on SCr of 0.91 mg/dL). Liver Function Tests: Recent Labs  Lab 03/27/20 1514 03/30/20 0505  AST 18 16  ALT 13 11  ALKPHOS 61 54  BILITOT 1.1 0.9  PROT 7.0 6.0*  ALBUMIN 3.7 3.2*   No results for input(s): LIPASE, AMYLASE in the last 168 hours. No results for input(s): AMMONIA in the last 168 hours. Coagulation Profile: No results for input(s): INR, PROTIME in the last 168 hours. Cardiac Enzymes: No results for input(s): CKTOTAL, CKMB, CKMBINDEX, TROPONINI in the last 168 hours. BNP (last 3 results) No results for input(s): PROBNP in the last 8760 hours. HbA1C: No results for input(s): HGBA1C in the last 72 hours. CBG: No results for input(s): GLUCAP in the last 168 hours. Lipid Profile: No results for input(s): CHOL, HDL, LDLCALC, TRIG, CHOLHDL, LDLDIRECT in the last 72 hours. Thyroid Function Tests: No results for input(s): TSH, T4TOTAL, FREET4, T3FREE, THYROIDAB in  the last 72 hours. Anemia Panel: No results for input(s): VITAMINB12, FOLATE, FERRITIN, TIBC, IRON, RETICCTPCT in the last 72 hours. Sepsis Labs: Recent Labs  Lab 03/28/20 0914  PROCALCITON <0.10  LATICACIDVEN 1.5    Recent Results (from the past 240 hour(s))  Resp Panel by RT-PCR (Flu A&B, Covid) Nasopharyngeal Swab     Status: None   Collection Time: 03/28/20  3:06 AM   Specimen: Nasopharyngeal Swab; Nasopharyngeal(NP) swabs in vial transport medium  Result Value Ref Range Status   SARS Coronavirus 2 by RT PCR NEGATIVE NEGATIVE Final    Comment: (NOTE) SARS-CoV-2 target nucleic acids are NOT DETECTED.  The SARS-CoV-2 RNA is generally detectable in upper respiratory specimens during the acute phase of infection. The lowest concentration of SARS-CoV-2 viral copies this assay can detect is 138 copies/mL. A negative result does not preclude SARS-Cov-2 infection and should not be used as the sole basis for treatment or other patient management decisions. A negative result may occur with  improper specimen collection/handling, submission of specimen other than nasopharyngeal swab, presence of viral mutation(s) within the areas targeted by this assay, and inadequate number of viral copies(<138 copies/mL). A negative result must be combined with clinical observations, patient history, and epidemiological information. The expected result is Negative.  Fact Sheet for Patients:  EntrepreneurPulse.com.au  Fact Sheet for Healthcare Providers:  IncredibleEmployment.be  This test is no t yet approved or cleared by the [place] FDA and  has been authorized for detection and/or diagnosis of SARS-CoV-2 by FDA under an Emergency Use Authorization (EUA). This EUA will remain  in effect (meaning this test can be used) for the duration of the COVID-19 declaration under Section 564(b)(1) of the Act, 21 U.S.C.section 360bbb-3(b)(1), unless the  authorization is terminated  or revoked sooner.       Influenza A by PCR NEGATIVE NEGATIVE Final   Influenza B by PCR NEGATIVE NEGATIVE Final    Comment: (NOTE) The Xpert Xpress SARS-CoV-2/FLU/RSV plus assay is intended as an aid in the diagnosis of influenza from Nasopharyngeal swab specimens and should not be used as a sole basis for treatment. Nasal washings and aspirates are unacceptable for Xpert Xpress SARS-CoV-2/FLU/RSV testing.  Fact Sheet for Patients: EntrepreneurPulse.com.au  Fact Sheet for Healthcare Providers: IncredibleEmployment.be  This test is not  yet approved or cleared by the [place] and has been authorized for detection and/or diagnosis of SARS-CoV-2 by FDA under an Emergency Use Authorization (EUA). This EUA will remain in effect (meaning this test can be used) for the duration of the COVID-19 declaration under Section 564(b)(1) of the Act, 21 U.S.C. section 360bbb-3(b)(1), unless the authorization is terminated or revoked.  Performed at [facility], [address] [place], [place] [address]   CULTURE, BLOOD (ROUTINE X 2) w Reflex to ID Panel     Status: None (Preliminary result)   Collection Time: 03/28/20  9:14 AM   Specimen: BLOOD  Result Value Ref Range Status   Specimen Description BLOOD RIGHT HAND  Final   Special Requests   Final    BOTTLES DRAWN AEROBIC AND ANAEROBIC Blood Culture results may not be optimal due to an inadequate volume of blood received in culture bottles   Culture   Final    NO GROWTH 2 DAYS Performed at [facility], [address] [place], [place] [address]    Report Status PENDING  Incomplete  CULTURE, BLOOD (ROUTINE X 2) w Reflex to ID Panel     Status: None (Preliminary result)   Collection Time: 03/28/20  9:14 AM   Specimen: BLOOD  Result Value Ref Range Status   Specimen Description BLOOD RIGHT [facility]  Final   Special Requests   Final    BOTTLES DRAWN  AEROBIC AND ANAEROBIC Blood Culture adequate volume   Culture   Final    NO GROWTH 2 DAYS Performed at [facility], [address], [place], [place] [address]    Report Status PENDING  Incomplete  MRSA PCR Screening     Status: None   Collection Time: 03/29/20  6:17 AM   Specimen: Nasopharyngeal  Result Value Ref Range Status   MRSA by PCR NEGATIVE NEGATIVE Final    Comment:        The GeneXpert MRSA Assay (FDA approved for NASAL specimens only), is one component of a comprehensive MRSA colonization surveillance program. It is not intended to diagnose MRSA infection nor to guide or monitor treatment for MRSA infections. Performed at [facility], [address], [place], [place] [address]      Radiology Studies: No results found. Scheduled Meds: . aspirin  324 mg Oral Daily  . docusate sodium  100 mg Oral BID  . enoxaparin (LOVENOX) injection  40 mg Subcutaneous Q24H  . LORazepam  0.5 mg Intravenous Once   Continuous Infusions: . dextrose 5 % and 0.45 % NaCl with KCl 20 mEq/L 75 mL/hr at 03/29/20 1946  . piperacillin-tazobactam (ZOSYN)  IV 3.375 g (03/30/20 1337)     LOS: 2 days    Time spent: 25 mins.    Shawna Clamp, MD [facility]   If 7PM-7AM, please contact night-coverage

## 2020-03-30 NOTE — Consult Note (Signed)
[place]  [phone] [phone]   Name: Gerald [name] Date: 1/[birth year] MRN: [id number]  DOB: June 24, 1920  Patient Care Team: [name], MD as PCP - General (Family Medicine) Cammie Sickle, MD as Consulting Physician (Internal Medicine)    REASON FOR CONSULTATION: Gerald [name] is a 87 y.o. male with multiple medical problems including hypertension, moderate mitral regurgitation, history of prostate/testicular cancer, paroxysmal atrial fibrillation, and myelodysplastic syndrome requiring intermittent transfusion.  Patient was admitted to the hospital 03/28/2020 with altered mental status.  He had been declining over the past year following hip replacement.  Most recently, he had been treated for URI by his PCP.  Initial chest x-ray in ER was concerning for possible pneumonia.  However, it was not clear on CT if patient actually had a pneumonia.  MRI of the brain revealed small acute versus subacute infarct in the right upper pons.  Palliative care was consulted to address goals.  SOCIAL HISTORY:     reports that he quit smoking about 62 years ago. His smoking use included cigarettes. He has a 25.00 pack-year smoking history. He has never used smokeless tobacco. He reports that he does not drink alcohol and does not use drugs.  Patient is a widower since 83.  He lives at home with his daughter.  He has 7 children in all.  Patient formally worked as an Recruitment consultant.  ADVANCE DIRECTIVES:  On file  CODE STATUS: Full code  PAST MEDICAL HISTORY: Past Medical History:  Diagnosis Date  . Anemia   . Arthritis   . Atrial fibrillation ([place])   . Cancer [facility])    Testicular  . Hemochromatosis 10/25/2014  . Hx of basal cell carcinoma 2008   R. ant. zygomatic/sideburn 2008, L preauricular 2019  . Hypertension   . Prostate cancer ([place])     PAST SURGICAL HISTORY:  Past Surgical History:   Procedure Laterality Date  . ANTERIOR APPROACH HEMI HIP ARTHROPLASTY Left 05/28/2019   Procedure: ANTERIOR APPROACH HEMI HIP ARTHROPLASTY;  Surgeon: Hessie Knows, MD;  Location: [facility] ORS;  Service: Orthopedics;  Laterality: Left;  . APPENDECTOMY    . CHOLECYSTECTOMY    . HAND SURGERY Bilateral   . HERNIA REPAIR    . INNER EAR SURGERY    . KYPHOPLASTY N/A 12/14/2015   Procedure: KYPHOPLASTY  L2;  Surgeon: Hessie Knows, MD;  Location: [facility] ORS;  Service: Orthopedics;  Laterality: N/A;  . KYPHOPLASTY N/A 12/23/2019   Procedure: KYPHOPLASTY;  Surgeon: Hessie Knows, MD;  Location: [facility] ORS;  Service: Orthopedics;  Laterality: N/A;  . PROSTATE SURGERY    . SURGERY SCROTAL / TESTICULAR Right     HEMATOLOGY/ONCOLOGY HISTORY:  Oncology History  MDS (myelodysplastic syndrome), low grade ([place])  10/18/2016 Initial Diagnosis   MDS (myelodysplastic syndrome), low grade ([facility])     ALLERGIES:  is allergic to levofloxacin.  MEDICATIONS:  Current Facility-Administered Medications  Medication Dose Route Frequency Provider Last Rate Last Admin  . acetaminophen (TYLENOL) tablet 650 mg  650 mg Oral Q6H PRN Gwynne Edinger, MD   650 mg at 03/30/20 2585  . aspirin chewable tablet 324 mg  324 mg Oral Daily Shawna Clamp, MD      . dextrose 5 % and 0.45 % NaCl with KCl 20 mEq/L infusion   Intravenous Continuous Gwynne Edinger, MD 75 mL/hr at 03/29/20 1946 New Bag at 03/29/20 1946  . docusate sodium (COLACE) capsule 100 mg  100 mg  Oral BID Gwynne Edinger, MD   100 mg at 03/29/20 2956  . enoxaparin (LOVENOX) injection 40 mg  40 mg Subcutaneous Q24H Gwynne Edinger, MD   40 mg at 03/30/20 2130  . lidocaine (LIDODERM) 5 % 1 patch  1 patch Transdermal Daily PRN Mansy, Arvella Merles, MD   1 patch at 03/29/20 0102  . LORazepam (ATIVAN) injection 0.5 mg  0.5 mg Intravenous Once Gwynne Edinger, MD      . piperacillin-tazobactam (ZOSYN) IVPB 3.375 g  3.375 g Intravenous Q8H Oswald [place], RPH 12.5 mL/hr  at 03/30/20 1337 3.375 g at 03/30/20 1337    VITAL SIGNS: BP (!) 161/80 (BP Location: Left Arm)   Pulse 71   Temp 98.2 F (36.8 C)   Resp 18   Ht '5\' 9"'  (1.753 m)   Wt 144 lb 11.2 oz (65.6 kg)   SpO2 98%   BMI 21.37 kg/m  Filed Weights   03/27/20 1506 03/30/20 0300  Weight: 138 lb 14.2 oz (63 kg) 144 lb 11.2 oz (65.6 kg)    Estimated body mass index is 21.37 kg/m as calculated from the following:   Height as of this encounter: '5\' 9"'  (1.753 m).   Weight as of this encounter: 144 lb 11.2 oz (65.6 kg).  LABS: CBC:    Component Value Date/Time   WBC 8.1 03/30/2020 0505   HGB 9.3 (L) 03/30/2020 0505   HGB 10.2 (L) 07/14/2014 1330   HCT 28.6 (L) 03/30/2020 0505   HCT 30.0 (L) 12/16/2013 1408   PLT 408 (H) 03/30/2020 0505   PLT 308 12/16/2013 1408   MCV 93.5 03/30/2020 0505   MCV 101 (H) 12/16/2013 1408   NEUTROABS 7.6 03/27/2020 1514   NEUTROABS 3.8 12/16/2013 1408   LYMPHSABS 1.3 03/27/2020 1514   LYMPHSABS 1.5 12/16/2013 1408   MONOABS 1.6 (H) 03/27/2020 1514   MONOABS 0.6 12/16/2013 1408   EOSABS 0.3 03/27/2020 1514   EOSABS 0.3 12/16/2013 1408   BASOSABS 0.1 03/27/2020 1514   BASOSABS 0.0 12/16/2013 1408   Comprehensive Metabolic Panel:    Component Value Date/Time   NA 142 03/30/2020 0505   NA 142 05/06/2013 0158   K 3.4 (L) 03/30/2020 0505   K 3.7 05/06/2013 0158   CL 106 03/30/2020 0505   CL 112 (H) 05/06/2013 0158   CO2 25 03/30/2020 0505   CO2 24 05/06/2013 0158   BUN 22 03/30/2020 0505   BUN 32 (H) 05/06/2013 0158   CREATININE 0.91 03/30/2020 0505   CREATININE 1.22 11/25/2013 1332   GLUCOSE 105 (H) 03/30/2020 0505   GLUCOSE 95 05/06/2013 0158   CALCIUM 9.0 03/30/2020 0505   CALCIUM 9.1 05/06/2013 0158   AST 16 03/30/2020 0505   AST 22 05/06/2013 0158   ALT 11 03/30/2020 0505   ALT 18 05/06/2013 0158   ALKPHOS 54 03/30/2020 0505   ALKPHOS 60 05/06/2013 0158   BILITOT 0.9 03/30/2020 0505   BILITOT 1.4 (H) 05/06/2013 0158   PROT 6.0 (L)  03/30/2020 0505   PROT 6.1 (L) 05/06/2013 0158   ALBUMIN 3.2 (L) 03/30/2020 0505   ALBUMIN 3.5 05/06/2013 0158    RADIOGRAPHIC STUDIES: DG Chest 1 View  Result Date: 03/28/2020 CLINICAL DATA:  Altered mental status EXAM: CHEST  1 VIEW COMPARISON:  05/27/2019 FINDINGS: Heart is normal size. Diffuse interstitial prominence throughout the lungs, favor chronic lung disease. No effusions. Aortic atherosclerosis. No acute bony abnormality. IMPRESSION: Diffuse interstitial prominence throughout the lungs, favor chronic interstitial  lung disease. Alternatively, this could reflect viral/atypical infection. Electronically Signed   By: Rolm Baptise M.D.   On: 03/28/2020 00:23   CT Head Wo Contrast  Result Date: 03/28/2020 CLINICAL DATA:  Mental status changes EXAM: CT HEAD WITHOUT CONTRAST TECHNIQUE: Contiguous axial images were obtained from the base of the skull through the vertex without intravenous contrast. COMPARISON:  None. 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 Chronic bilateral basal ganglia lacunar infarcts. There is atrophy and chronic small vessel disease changes. No acute intracranial abnormality. Specifically, no hemorrhage, hydrocephalus, mass lesion, acute infarction, or significant intracranial injury. Vascular: No hyperdense vessel or unexpected calcification. Skull: No acute calvarial abnormality. Sinuses/Orbits: Air-fluid level in the right maxillary sinus. 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 Atrophy, chronic microvascular disease. No acute intracranial abnormality. Electronically Signed   By: Rolm Baptise M.D.   On: 03/28/2020 00:21   CT Angio Chest PE W and/or Wo Contrast  Result Date: 03/28/2020 CLINICAL DATA:  Lethargy, possible pulmonary embolism, abdominal pain EXAM: CT ANGIOGRAPHY CHEST CT ABDOMEN AND PELVIS WITH CONTRAST TECHNIQUE: Multidetector CT imaging of the chest was performed using the  standard protocol during bolus administration of intravenous contrast. Multiplanar CT image reconstructions and MIPs were obtained to evaluate the vascular anatomy. Multidetector CT imaging of the abdomen and pelvis was performed using the standard protocol during bolus administration of intravenous contrast. CONTRAST:  16m OMNIPAQUE IOHEXOL 350 MG/ML SOLN COMPARISON:  MR lumbar spine 12/20/2019, fluoroscopy lumbar spine 12/23/2019, 12/14/2015, CT abdomen pelvis 12/28/1998 (report only) FINDINGS: CTA CHEST FINDINGS Cardiovascular: Satisfactory opacification of pulmonary arteries. No large central or lobar pulmonary arterial filling defects are identified. Respiratory motion may limit detection of smaller, subsegmental pulmonary emboli. Borderline dilatation of the the pulmonary trunk. Cardiac size is top normal. Three-vessel coronary artery atherosclerosis is noted. Trace pericardial fluid including fluid within the pericardial recesses. Ascending thoracic aortic dilatation to 4.3 cm. Atherosclerotic plaque throughout the thoracic aorta. No acute luminal abnormality. No focal periaortic stranding or hemorrhage. Cerebral region of the brachiocephalic and left common carotid arteries. The left vertebral artery arises directly from the aortic arch between this shared origin and the left subclavian artery. Proximal great vessels are otherwise unremarkable. Mediastinum/Nodes: Fluid in the pericardial recesses. No mediastinal gas. Normal thyroid gland and thoracic inlet. No acute abnormality of the esophagus. Secretions noted in the trachea, posterior bowing likely related to imaging during exhalation. No worrisome mediastinal, hilar or axillary adenopathy. Shotty low-attenuation subcentimeter mediastinal and hilar lymph nodes are favored to be edematous or reactive. Lungs/Pleura: Diffuse airways thickening and scattered secretions. Diffuse interlobular septal thickening, pulmonary vascular redistribution and hazy  ground-glass opacity particularly within the more dependent portions of the lungs are findings most compatible with interstitial and early alveolar edema. Small to moderate bilateral pleural effusions are present, right slightly greater than left. Adjacent passive atelectatic changes. No pneumothorax. Musculoskeletal: T12 compression deformity with vertebroplasty changes, with 20% residual height loss not significantly changed from fluoroscopic images at the time of procedure 12/23/2019. No new acute osseous abnormalities or suspicious lytic or blastic lesions are seen. No worrisome chest wall masses or lesions. Review of the MIP images confirms the above findings. CT ABDOMEN and PELVIS FINDINGS Hepatobiliary: Few punctate parenchymal calcifications within the liver, largest in segment 4) 5/21) possibly vascular versus prior granulomatous disease. No worrisome focal liver lesions. Smooth liver surface contour. Normal hepatic attenuation. Prior cholecystectomy. Marked distension of the intra and extrahepatic biliary tree with the  common bile duct measuring up to 15 mm in diameter, greater than expected for typical post cholecystectomy reservoir effect and senescent change. No visible intraductal gallstones. Pancreas: No pancreatic ductal dilatation or surrounding inflammatory changes. Spleen: Heterogeneous enhancement the spleen is nonspecific and possibly related to arterial phase of contrast imaging though a slightly more wedge-shaped area of hypoattenuation is seen in the posterior spleen (5/20) which could reflect an early splenic infarct. Adrenals/Urinary Tract: No concerning adrenal nodules or masses. There is a hyperdense cystic appearing lesion measuring up to 2.8 cm in the lower pole left kidney though demonstrate some conspicuous increase in attenuation of approximately 15 Hounsfield units on the delayed phase imaging (5/38, 10/20) more simple appearing cysts are seen elsewhere in both kidneys with a  slightly larger minimally complex cyst with thin internal septations arising from the upper pole right kidney measuring up to 2 cm in size (5/38) no other concerning renal lesions. Kidneys enhance and excrete symmetrically. No urolithiasis or hydronephrosis. Excreted contrast material is noted within the collecting system from separate contrast bolus administration. Urinary bladder is otherwise unremarkable. Stomach/Bowel: Sliding-type hiatal hernia. Stomach and duodenum are unremarkable. No small bowel thickening or dilatation. The appendix is surgically absent. No colonic dilatation or wall thickening. Scattered colonic diverticula without focal inflammation to suggest diverticulitis. Some mild segmental thickening of the sigmoid in a region of numerous colonic diverticula without acute pericolonic or focal diverticular inflammation could reflect sequela of prior inflammation. Mild circumferential anorectal thickening with perirectal fat stranding is nonspecific though could correlate for features of proctitis. Vascular/Lymphatic: Atherosclerotic calcifications within the abdominal aorta and branch vessels. No aneurysm or ectasia. No enlarged abdominopelvic lymph nodes. Reproductive: Prostate appears surgically absent. Other: Perirectal hazy stranding, as above. Postsurgical changes from prior low vertical midline incision. Mild body wall edema. No abdominopelvic free air or fluid. Musculoskeletal: Remote compression deformity L2 with evidence of prior vertebroplasty in slight extravasation of cement into the L3-4 disc space. Up to 70% residual height loss centrally. Stepwise likely degenerative retrolisthesis L2-L5. Additional transitional lumbosacral anatomy, with what is likely a rudimentary disc between the S1 and S2 levels. The osseous structures appear diffusely demineralized which may limit detection of small or nondisplaced fractures. No acute osseous abnormality or suspicious osseous lesion. Prior left  hip arthroplasty with streak artifact. Hardware is in expected alignment without complication. Review of the MIP images confirms the above findings. IMPRESSION: 1. No evidence of large central or lobar pulmonary arterial filling defects to suggest pulmonary embolism. Respiratory motion may limit detection of smaller, subsegmental pulmonary emboli. 2. Features of congestive heart failure with interstitial and early alveolar edema, small to moderate bilateral pleural effusions. 3. Ascending thoracic aortic dilatation to 4.3 cm. No evidence of dissection. Guidelines recommend annual imaging followup by CTA or MRA. This recommendation follows 2010 ACCF/AHA/AATS/ACR/ASA/SCA/SCAI/SIR/STS/SVM Guidelines for the Diagnosis and Management of Patients with Thoracic Aortic Disease. Circulation. 2010; 121: F027-X412. Aortic aneurysm NOS (ICD10-I71.9) 4. Heterogeneous enhancement the spleen is nonspecific and possibly related to arterial phase of contrast imaging though a slightly more wedge-shaped area of hypoattenuation is seen in the posterior spleen could reflect an early splenic infarct. 5. Mild circumferential anorectal thickening with perirectal fat stranding is nonspecific though could correlate for features of proctitis, and consider direct visualization. 6. Segmental thickening of the mid sigmoid colon in a region of numerous colonic diverticula the albeit without acute inflammation at this time, may reflect sequela of prior diverticular inflammation. 7. Hyperdense cystic appearing lesion measuring up to 2.8 cm  in the lower pole left kidney though demonstrate some conspicuous increase in attenuation of approximately 15 Hounsfield units on the delayed phase imaging. Recommend further evaluation with nonemergent renal ultrasound for further evaluation. 8. Sliding-type hiatal hernia. 9. Compression deformities and vertebroplasty changes T12 and L2. Electronically Signed   By: Lovena Le M.D.   On: 03/28/2020 05:03   MR  BRAIN WO CONTRAST  Result Date: 03/28/2020 CLINICAL DATA:  Mental status change, unknown cause. EXAM: MRI HEAD WITHOUT CONTRAST TECHNIQUE: Multiplanar, multiecho pulse sequences of the brain and surrounding structures were obtained without intravenous contrast. COMPARISON:  CT head 03/28/2020 FINDINGS: Brain: Small acute or early subacute infarct in the right upper pons (see series 5 and 6, image 18). Mild associated edema without mass effect. Remote right frontal lobe infarct with encephalomalacia and surrounding gliosis. Multiple dilated perivascular spaces versus lacunar infarcts in bilateral basal ganglia. Additional scattered T2/FLAIR hyperintensity within the white matter, compatible with chronic microvascular ischemic disease. Moderate diffuse cerebral volume loss with ex vacuo ventricular dilation. No acute hemorrhage. No hydrocephalus. No midline shift. No extra-axial fluid collection. No mass lesion. Vascular: Abnormal flow related signal within the visualized right vertebral artery, concerning for stenosis or occlusion. Skull and upper cervical spine: Diffuse T1 hypointensity of the marrow. Sinuses/Orbits: Inferior right maxillary sinus mucosal thickening. Unremarkable orbits. Other: Small right mastoid effusion. IMPRESSION: 1. Small acute or early subacute infarct in the right upper pons. Mild associated edema without mass effect. 2. Abnormal flow related signal within the visualized right vertebral artery, concerning for occlusion or significant stenosis. Recommend CTA to further evaluate. 3. Remote right frontal lobe infarct and dilated perivascular spaces versus remote lacunar infarcts in bilateral basal ganglia. 4. Moderate chronic microvascular ischemic disease and generalized cerebral volume loss. Diffuse 5. Diffuse T1 hypointensity of the marrow, which is nonspecific and could relate to chronic hypoxia (such as in smokers), chronic anemia, or lymphoproliferative disorder. Conclusions #1 and #2  were discussed with Dr. Jaclynn Guarneri Via telephone at 12:25 p.m. Electronically Signed   By: Margaretha Sheffield MD   On: 03/28/2020 12:31   CT ABDOMEN PELVIS W CONTRAST  Result Date: 03/28/2020 CLINICAL DATA:  Lethargy, possible pulmonary embolism, abdominal pain EXAM: CT ANGIOGRAPHY CHEST CT ABDOMEN AND PELVIS WITH CONTRAST TECHNIQUE: Multidetector CT imaging of the chest was performed using the standard protocol during bolus administration of intravenous contrast. Multiplanar CT image reconstructions and MIPs were obtained to evaluate the vascular anatomy. Multidetector CT imaging of the abdomen and pelvis was performed using the standard protocol during bolus administration of intravenous contrast. CONTRAST:  153m OMNIPAQUE IOHEXOL 350 MG/ML SOLN COMPARISON:  MR lumbar spine 12/20/2019, fluoroscopy lumbar spine 12/23/2019, 12/14/2015, CT abdomen pelvis 12/28/1998 (report only) FINDINGS: CTA CHEST FINDINGS Cardiovascular: Satisfactory opacification of pulmonary arteries. No large central or lobar pulmonary arterial filling defects are identified. Respiratory motion may limit detection of smaller, subsegmental pulmonary emboli. Borderline dilatation of the the pulmonary trunk. Cardiac size is top normal. Three-vessel coronary artery atherosclerosis is noted. Trace pericardial fluid including fluid within the pericardial recesses. Ascending thoracic aortic dilatation to 4.3 cm. Atherosclerotic plaque throughout the thoracic aorta. No acute luminal abnormality. No focal periaortic stranding or hemorrhage. Cerebral region of the brachiocephalic and left common carotid arteries. The left vertebral artery arises directly from the aortic arch between this shared origin and the left subclavian artery. Proximal great vessels are otherwise unremarkable. Mediastinum/Nodes: Fluid in the pericardial recesses. No mediastinal gas. Normal thyroid gland and thoracic inlet. No acute abnormality of the  esophagus. Secretions noted in  the trachea, posterior bowing likely related to imaging during exhalation. No worrisome mediastinal, hilar or axillary adenopathy. Shotty low-attenuation subcentimeter mediastinal and hilar lymph nodes are favored to be edematous or reactive. Lungs/Pleura: Diffuse airways thickening and scattered secretions. Diffuse interlobular septal thickening, pulmonary vascular redistribution and hazy ground-glass opacity particularly within the more dependent portions of the lungs are findings most compatible with interstitial and early alveolar edema. Small to moderate bilateral pleural effusions are present, right slightly greater than left. Adjacent passive atelectatic changes. No pneumothorax. Musculoskeletal: T12 compression deformity with vertebroplasty changes, with 20% residual height loss not significantly changed from fluoroscopic images at the time of procedure 12/23/2019. No new acute osseous abnormalities or suspicious lytic or blastic lesions are seen. No worrisome chest wall masses or lesions. Review of the MIP images confirms the above findings. CT ABDOMEN and PELVIS FINDINGS Hepatobiliary: Few punctate parenchymal calcifications within the liver, largest in segment 4) 5/21) possibly vascular versus prior granulomatous disease. No worrisome focal liver lesions. Smooth liver surface contour. Normal hepatic attenuation. Prior cholecystectomy. Marked distension of the intra and extrahepatic biliary tree with the common bile duct measuring up to 15 mm in diameter, greater than expected for typical post cholecystectomy reservoir effect and senescent change. No visible intraductal gallstones. Pancreas: No pancreatic ductal dilatation or surrounding inflammatory changes. Spleen: Heterogeneous enhancement the spleen is nonspecific and possibly related to arterial phase of contrast imaging though a slightly more wedge-shaped area of hypoattenuation is seen in the posterior spleen (5/20) which could reflect an early  splenic infarct. Adrenals/Urinary Tract: No concerning adrenal nodules or masses. There is a hyperdense cystic appearing lesion measuring up to 2.8 cm in the lower pole left kidney though demonstrate some conspicuous increase in attenuation of approximately 15 Hounsfield units on the delayed phase imaging (5/38, 10/20) more simple appearing cysts are seen elsewhere in both kidneys with a slightly larger minimally complex cyst with thin internal septations arising from the upper pole right kidney measuring up to 2 cm in size (5/38) no other concerning renal lesions. Kidneys enhance and excrete symmetrically. No urolithiasis or hydronephrosis. Excreted contrast material is noted within the collecting system from separate contrast bolus administration. Urinary bladder is otherwise unremarkable. Stomach/Bowel: Sliding-type hiatal hernia. Stomach and duodenum are unremarkable. No small bowel thickening or dilatation. The appendix is surgically absent. No colonic dilatation or wall thickening. Scattered colonic diverticula without focal inflammation to suggest diverticulitis. Some mild segmental thickening of the sigmoid in a region of numerous colonic diverticula without acute pericolonic or focal diverticular inflammation could reflect sequela of prior inflammation. Mild circumferential anorectal thickening with perirectal fat stranding is nonspecific though could correlate for features of proctitis. Vascular/Lymphatic: Atherosclerotic calcifications within the abdominal aorta and branch vessels. No aneurysm or ectasia. No enlarged abdominopelvic lymph nodes. Reproductive: Prostate appears surgically absent. Other: Perirectal hazy stranding, as above. Postsurgical changes from prior low vertical midline incision. Mild body wall edema. No abdominopelvic free air or fluid. Musculoskeletal: Remote compression deformity L2 with evidence of prior vertebroplasty in slight extravasation of cement into the L3-4 disc space. Up  to 70% residual height loss centrally. Stepwise likely degenerative retrolisthesis L2-L5. Additional transitional lumbosacral anatomy, with what is likely a rudimentary disc between the S1 and S2 levels. The osseous structures appear diffusely demineralized which may limit detection of small or nondisplaced fractures. No acute osseous abnormality or suspicious osseous lesion. Prior left hip arthroplasty with streak artifact. Hardware is in expected alignment without complication. Review of the  MIP images confirms the above findings. IMPRESSION: 1. No evidence of large central or lobar pulmonary arterial filling defects to suggest pulmonary embolism. Respiratory motion may limit detection of smaller, subsegmental pulmonary emboli. 2. Features of congestive heart failure with interstitial and early alveolar edema, small to moderate bilateral pleural effusions. 3. Ascending thoracic aortic dilatation to 4.3 cm. No evidence of dissection. Guidelines recommend annual imaging followup by CTA or MRA. This recommendation follows 2010 ACCF/AHA/AATS/ACR/ASA/SCA/SCAI/SIR/STS/SVM Guidelines for the Diagnosis and Management of Patients with Thoracic Aortic Disease. Circulation. 2010; 121: O756-E332. Aortic aneurysm NOS (ICD10-I71.9) 4. Heterogeneous enhancement the spleen is nonspecific and possibly related to arterial phase of contrast imaging though a slightly more wedge-shaped area of hypoattenuation is seen in the posterior spleen could reflect an early splenic infarct. 5. Mild circumferential anorectal thickening with perirectal fat stranding is nonspecific though could correlate for features of proctitis, and consider direct visualization. 6. Segmental thickening of the mid sigmoid colon in a region of numerous colonic diverticula the albeit without acute inflammation at this time, may reflect sequela of prior diverticular inflammation. 7. Hyperdense cystic appearing lesion measuring up to 2.8 cm in the lower pole left  kidney though demonstrate some conspicuous increase in attenuation of approximately 15 Hounsfield units on the delayed phase imaging. Recommend further evaluation with nonemergent renal ultrasound for further evaluation. 8. Sliding-type hiatal hernia. 9. Compression deformities and vertebroplasty changes T12 and L2. Electronically Signed   By: Lovena Le M.D.   On: 03/28/2020 05:03   ECHOCARDIOGRAM COMPLETE  Result Date: 03/28/2020    ECHOCARDIOGRAM REPORT   Patient Name:   Gerald [name] Date of Exam: 03/28/2020 Medical Rec #:  [id number]           Height:       69.0 in Accession #:    [id number]          Weight:       138.9 lb Date of Birth:  22-Feb-1921          BSA:          1.769 m Patient Age:    52 years            BP:           119/81 mmHg Patient Gender: M                   HR:           119 bpm. Exam Location:  [facility] Procedure: 2D Echo, Color Doppler and Cardiac Doppler Indications:     CHF-acute diastolic N23.55  History:         Patient has no prior history of Echocardiogram examinations.                  Arrythmias:Atrial Fibrillation; Risk Factors:Hypertension.  Sonographer:     Sherrie Sport RDCS (AE) Referring Phys:  Leslie DDUK Diagnosing Phys: Yolonda Kida MD IMPRESSIONS  1. Inferior hypokinesis.  2. Left ventricular ejection fraction, by estimation, is 50 to 55%. The left ventricle has low normal function. The left ventricle demonstrates regional wall motion abnormalities (see scoring diagram/findings for description). Left ventricular diastolic  parameters were normal.  3. Right ventricular systolic function is normal. The right ventricular size is normal.  4. The mitral valve is normal in structure. Mild mitral valve regurgitation.  5. The aortic valve is normal in structure. Aortic valve regurgitation is trivial. FINDINGS  Left Ventricle: Left ventricular ejection fraction, by estimation, is 50  to 55%. The left ventricle has low normal function. The left ventricle  demonstrates regional wall motion abnormalities. The left ventricular internal cavity size was normal in size. There is no left ventricular hypertrophy. Left ventricular diastolic parameters were normal. Right Ventricle: The right ventricular size is normal. No increase in right ventricular wall thickness. Right ventricular systolic function is normal. Left Atrium: Left atrial size was normal in size. Right Atrium: Right atrial size was normal in size. Pericardium: There is no evidence of pericardial effusion. Mitral Valve: The mitral valve is normal in structure. Mild mitral valve regurgitation. Tricuspid Valve: The tricuspid valve is normal in structure. Tricuspid valve regurgitation is mild. Aortic Valve: The aortic valve is normal in structure. Aortic valve regurgitation is trivial. Aortic valve mean gradient measures 2.0 mmHg. Aortic valve peak gradient measures 3.1 mmHg. Aortic valve area, by VTI measures 3.96 cm. Pulmonic Valve: The pulmonic valve was grossly normal. Pulmonic valve regurgitation is not visualized. Aorta: The ascending aorta was not well visualized. IAS/Shunts: No atrial level shunt detected by color flow Doppler. Additional Comments: Inferior hypokinesis.  LEFT VENTRICLE PLAX 2D LVIDd:         3.90 cm LVIDs:         2.80 cm LV PW:         1.20 cm LV IVS:        1.30 cm LVOT diam:     2.30 cm LV SV:         40 LV SV Index:   23 LVOT Area:     4.15 cm  RIGHT VENTRICLE RV Basal diam:  2.40 cm RV S prime:     17.40 cm/s TAPSE (M-mode): 3.0 cm LEFT ATRIUM             Index       RIGHT ATRIUM          Index LA diam:        4.40 cm 2.49 cm/m  RA Area:     7.20 cm LA Vol (A2C):   39.5 ml 22.32 ml/m RA Volume:   12.30 ml 6.95 ml/m LA Vol (A4C):   17.1 ml 9.66 ml/m LA Biplane Vol: 27.6 ml 15.60 ml/m  AORTIC VALVE                   PULMONIC VALVE AV Area (Vmax):    2.70 cm    PV Vmax:        0.38 m/s AV Area (Vmean):   3.02 cm    PV Peak grad:   0.6 mmHg AV Area (VTI):     3.96 cm    RVOT  Peak grad: 2 mmHg AV Vmax:           87.50 cm/s AV Vmean:          55.800 cm/s AV VTI:            0.101 m AV Peak Grad:      3.1 mmHg AV Mean Grad:      2.0 mmHg LVOT Vmax:         56.80 cm/s LVOT Vmean:        40.600 cm/s LVOT VTI:          0.096 m LVOT/AV VTI ratio: 0.95  AORTA Ao Root diam: 3.77 cm MITRAL VALVE               TRICUSPID VALVE MV Area (PHT): 3.84 cm    TR Peak grad:   8.6  mmHg MV Decel Time: 197 msec    TR Vmax:        147.00 cm/s MV E velocity: 77.43 cm/s                            SHUNTS                            Systemic VTI:  0.10 m                            Systemic Diam: 2.30 cm Dwayne Prince Rome MD Electronically signed by Yolonda Kida MD Signature Date/Time: 03/28/2020/4:43:57 PM    Final     PERFORMANCE STATUS (ECOG) : 3 - Symptomatic, >50% confined to bed  Review of Systems Unable to complete  Physical Exam General: Thin, frail-appearing Pulmonary: Unlabored Extremities: no edema, no joint deformities Skin: no rashes Neurological: Weakness, confusion  IMPRESSION: I met with patient's daughter, [name], who was at bedside.  Patient was somewhat lethargic and did not engage meaningfully in a conversation regarding his goals.  Daughter tells me the patient has significantly declined over the past year following a hip replacement.  He has slowly required increased level of support from family with ADLs.  We discussed the likelihood that this hospitalization will result in further decline.  She says that she does not expect the patient will return to his previous baseline.  We also discussed the possibility that patient could be nearing end-of-life.  Patient has been followed at home by home health and home palliative care.  Family are interested in patient returning home instead of going to rehab.  Patient would be appropriate for hospice care at home given his progressive decline.  This option was discussed in detail.  However, daughter says that family would likely  want him to return home with home health and home palliative care and would consider hospice if needed in the future.  Daughter says that [place] has been discussed with family multiple times in the past including by Natalia Leatherwood, NP with [facility] palliative care.  Daughter verbalized clearly an understanding that resuscitative efforts would almost certainly prove futile in the setting of advanced age, declining performance status, and multiple comorbidities.  She also cites the recent passing of her ex mother-in-law who received CPR, which resulted in multiple rib fractures and from which she ultimately did not survive.  Daughter says that she thinks family would want patient to remain a full code in the hospital but would be willing to reconsider CODE STATUS once he returns home.  I called and spoke with Christin Gusler, NP, who is able to continue following patient after discharge from the hospital.  PLAN: -Continue current scope of treatment -Dispo: Probable home health with palliative care following -Full code  Case discussed with Dr. Rogue Bussing   Time Total: 60 minutes  Visit consisted of counseling and education dealing with the complex and emotionally intense issues of symptom management and palliative care in the setting of serious and potentially life-threatening illness.Greater than 50%  of this time was spent counseling and coordinating care related to the above assessment and plan.  Signed by: Altha Harm, PhD, NP-C

## 2020-03-31 DIAGNOSIS — Z515 Encounter for palliative care: Secondary | ICD-10-CM

## 2020-03-31 DIAGNOSIS — R531 Weakness: Secondary | ICD-10-CM | POA: Diagnosis not present

## 2020-03-31 LAB — CBC
HCT: 28.1 % — ABNORMAL LOW (ref 39.0–52.0)
Hemoglobin: 9.2 g/dL — ABNORMAL LOW (ref 13.0–17.0)
MCH: 30.7 pg (ref 26.0–34.0)
MCHC: 32.7 g/dL (ref 30.0–36.0)
MCV: 93.7 fL (ref 80.0–100.0)
Platelets: 391 10*3/uL (ref 150–400)
RBC: 3 MIL/uL — ABNORMAL LOW (ref 4.22–5.81)
RDW: 21.8 % — ABNORMAL HIGH (ref 11.5–15.5)
WBC: 9 10*3/uL (ref 4.0–10.5)
nRBC: 0 % (ref 0.0–0.2)

## 2020-03-31 LAB — BASIC METABOLIC PANEL
Anion gap: 9 (ref 5–15)
BUN: 21 mg/dL (ref 8–23)
CO2: 25 mmol/L (ref 22–32)
Calcium: 8.9 mg/dL (ref 8.9–10.3)
Chloride: 108 mmol/L (ref 98–111)
Creatinine, Ser: 0.79 mg/dL (ref 0.61–1.24)
GFR, Estimated: >60 mL/min (ref >60)
Glucose, Bld: 100 mg/dL — ABNORMAL HIGH (ref 70–99)
Potassium: 3.6 mmol/L (ref 3.5–5.1)
Sodium: 142 mmol/L (ref 135–145)

## 2020-03-31 LAB — MAGNESIUM: Magnesium: 1.8 mg/dL (ref 1.7–2.4)

## 2020-03-31 LAB — PHOSPHORUS: Phosphorus: 3.2 mg/dL (ref 2.5–4.6)

## 2020-03-31 MED ORDER — HYDRALAZINE HCL 25 MG PO TABS
25.0000 mg | ORAL_TABLET | Freq: Three times a day (TID) | ORAL | Status: DC
  Administered 2020-03-31 – 2020-04-02 (×6): 25 mg via ORAL
  Filled 2020-03-31 (×6): qty 1

## 2020-03-31 NOTE — Progress Notes (Signed)
PROGRESS NOTE    Gerald [name]  [id number] DOB: 09-25-1920 DOA: 03/28/2020 PCP: [name], MD   Brief Narrative:  This 87 yrs old male with PMH significant for HTN, myelodysplastic syndrome, moderate mitral regurgitation, chronic anemia, remote history of prostate or testicular cancer, paroxysmal A. fib who presents with generalized weakness and altered mental status.  History is obtained from daughter since the patient was confused.  Family reports patient had hip replaced little under a year ago and since then family noticed gradual decline.  Patient has been less mobile with less energy and cognitive decline.  He was recently treated for respiratory infection by his primary care physician with antibiotics 3 weeks ago.  Patient has worsening mental status after he completed antibiotics.  Patient was sent from oncologist office for altered mental status.  The abdomen shows finding consistent with proctitis, questionable splenic infarct , Patient started on Zosyn.  MRI shows acute stroke.   Assessment & Plan:   Active Problems:   Anemia   A-fib (HCC)   Essential (primary) hypertension   Hemochromatosis   MDS (myelodysplastic syndrome), low grade (HCC)   Acute proctitis   Acute encephalopathy   Pressure injury of sacral region, stage 2 (HCC)   CVA (cerebral vascular accident) [facility])   Palliative care encounter   Acute encephalopathy could be secondary to acute stroke> improving Etiology unclear at admission. CT Head w/o acute findings. May be infection given mild leukocytosis but no localizing symptoms.  Recent URI but that appears mostly resolved.  CT Abdomen shows possible proctitis maybe 2/2 chronic constipation though report of stooling regularly as of late.  Urinalysis not suggestive of infection. CT does show some signs of fluid overload and breathing comfortably on room air.  Covid negative. Normal glucose, no significant electrolyte abnormalities. No signs hepatic  dysfunction or uremia.  Not on meds associated with this. Could represent end of life condition in this very elderly patient. Full code for now but daughter will discuss w/ other family members, She does think at this point patient would probably want to be DNR MRI Brain: Small acute or early subacute infarct in the right upper pons. Mild associated edema without mass effect. MRSA nares negative, will continue zosyn for now, presume would treat acute proctatitis.  Blood cultures no growth so far, baseline procalcitonin normal.  Likely stop abx in next 24-48 hours. Lactic acid 1.5 - slp swallow eval, dysphagia 2 dieT.  Elevated troponin: Interstitial edema seen on CT of chest: Patient does have history of moderate mitral regurg. Seen on CTA. No PE. ED gave lasix x1.  Patient appears euvolemic. Echocardiogram shows inferior hypokinesis, regional wall motion abnormalities EF 50 to 55%. Patient denies any chest pain, EKG no ST-T wave changes. Cardiology consulted, patient appears in normal sinus rhythm,  denies any chest pain,  no plan for invasive work-up or further cardiac imaging at this time.   Myelodysplastic syndrome Chronic anemia Mild per onc. Here hgb is actually higher than baseline, perhaps 2/2 mild dehydration - f/u smear  Hypokalemia : Mild, 3.4, replacement in progress,  recheck labs in AM.  Atrial fibrillation, paroxysmal. Not anticoagualted due to fall risk and age.  Hold home aspirin, benefit at this point doubtful No plan for invasive work-up or further cardiac imaging at this time.  Stage 2 pressure ulcer - nursing wound care ordered  splenic infarct? possible splenic infarct seen on CTA. No report of abdominal pain.  - will monitor for now  Proctitis? Does have  hx constipation. Doubtful would be cause of encephalopathy if present DC antibiotics. - consider GI consult  Cystic lesion in kidney - outpt renal u/s, consider  HTN: Blood pressure  remains normal Continue to monitor blood pressure off medication  History constipation - cont home colace   DVT prophylaxis: Lovenox Code Status: Full code Family Communication: Daughters were at bedside  Disposition Plan:   Status is: Inpatient  Remains inpatient appropriate because:Inpatient level of care appropriate due to severity of illness   Dispo: The patient is from: Home              Anticipated d/c is to: Home              Anticipated d/c date is: > 3 days              Patient currently is not medically stable to d/c.  Consultants:   None  Procedures:  Antimicrobials:  Anti-infectives (From admission, onward)   Start     Dose/Rate Route Frequency Ordered Stop   03/28/20 1400  piperacillin-tazobactam (ZOSYN) IVPB 3.375 g  Status:  Discontinued        3.375 g 12.5 mL/hr over 240 Minutes Intravenous Every 8 hours 03/28/20 0858 03/31/20 1132   03/28/20 0530  piperacillin-tazobactam (ZOSYN) IVPB 3.375 g        3.375 g 100 mL/hr over 30 Minutes Intravenous  Once 03/28/20 0517 03/28/20 8119      Subjective: Patient was seen and examined at bedside.  Overnight events noted.  Patient seems alert and oriented x1.   Family states patient is at baseline mental status now.  Overnight patient has developed loose watery stools..  Objective: Vitals:   03/31/20 0411 03/31/20 0804 03/31/20 1119 03/31/20 1448  BP: (!) 186/66 (!) 175/59 (!) 178/71 (!) 179/65  Pulse: 64 61 67 66  Resp: 18 15 15    Temp: 98.2 F (36.8 C) 98.1 F (36.7 C) 97.7 F (36.5 C)   TempSrc: Oral Oral Oral   SpO2: 100% 95% 97%   Weight: 64.9 kg     Height:        Intake/Output Summary (Last 24 hours) at 03/31/2020 1522 Last data filed at 03/31/2020 0751 Gross per 24 hour  Intake 968.59 ml  Output 1050 ml  Net -81.41 ml   Filed Weights   03/27/20 1506 03/30/20 0300 03/31/20 0411  Weight: 63 kg 65.6 kg 64.9 kg    Examination:  General exam: Appears calm and comfortable, not in any acute  distress. Respiratory system: Clear to auscultation. Respiratory effort normal. Cardiovascular system: S1 & S2 heard, RRR. No JVD, murmurs, rubs, gallops or clicks. No pedal edema. Gastrointestinal system: Abdomen is nondistended, soft and nontender. No organomegaly or masses felt. Normal bowel sounds heard. Central nervous system: Alert and oriented. No focal neurological deficits. Extremities: Symmetric 5 x 5 power.  No edema, no cyanosis, no clubbing. Skin: No rashes, lesions or ulcers Psychiatry: Judgement and insight appear normal. Mood & affect appropriate.     Data Reviewed: I have personally reviewed following labs and imaging studies  CBC: Recent Labs  Lab 03/27/20 1514 03/29/20 0609 03/30/20 0505 03/31/20 0720  WBC 11.0* 9.3 8.1 9.0  NEUTROABS 7.6  --   --   --   HGB 10.3* 9.4* 9.3* 9.2*  HCT 32.0* 28.9* 28.6* 28.1*  MCV 94.7 93.8 93.5 93.7  PLT 444* 383 408* 147   Basic Metabolic Panel: Recent Labs  Lab 03/27/20 1514 03/28/20 0914 03/29/20 0609 03/30/20  0505 03/31/20 0720  NA 140  --  138 142 142  K 3.4*  --  3.5 3.4* 3.6  CL 103  --  102 106 108  CO2 26  --  26 25 25   GLUCOSE 130*  --  120* 105* 100*  BUN 22  --  23 22 21   CREATININE 0.84  --  0.98 0.91 0.79  CALCIUM 9.8  --  8.9 9.0 8.9  MG  --  1.9  --  1.8 1.8  PHOS  --   --   --  3.8 3.2   GFR: Estimated Creatinine Clearance: 48.4 mL/min (by C-G formula based on SCr of 0.79 mg/dL). Liver Function Tests: Recent Labs  Lab 03/27/20 1514 03/30/20 0505  AST 18 16  ALT 13 11  ALKPHOS 61 54  BILITOT 1.1 0.9  PROT 7.0 6.0*  ALBUMIN 3.7 3.2*   No results for input(s): LIPASE, AMYLASE in the last 168 hours. No results for input(s): AMMONIA in the last 168 hours. Coagulation Profile: No results for input(s): INR, PROTIME in the last 168 hours. Cardiac Enzymes: No results for input(s): CKTOTAL, CKMB, CKMBINDEX, TROPONINI in the last 168 hours. BNP (last 3 results) No results for input(s): PROBNP  in the last 8760 hours. HbA1C: No results for input(s): HGBA1C in the last 72 hours. CBG: No results for input(s): GLUCAP in the last 168 hours. Lipid Profile: No results for input(s): CHOL, HDL, LDLCALC, TRIG, CHOLHDL, LDLDIRECT in the last 72 hours. Thyroid Function Tests: No results for input(s): TSH, T4TOTAL, FREET4, T3FREE, THYROIDAB in the last 72 hours. Anemia Panel: No results for input(s): VITAMINB12, FOLATE, FERRITIN, TIBC, IRON, RETICCTPCT in the last 72 hours. Sepsis Labs: Recent Labs  Lab 03/28/20 0914  PROCALCITON <0.10  LATICACIDVEN 1.5    Recent Results (from the past 240 hour(s))  Resp Panel by RT-PCR (Flu A&B, Covid) Nasopharyngeal Swab     Status: None   Collection Time: 03/28/20  3:06 AM   Specimen: Nasopharyngeal Swab; Nasopharyngeal(NP) swabs in vial transport medium  Result Value Ref Range Status   SARS Coronavirus 2 by RT PCR NEGATIVE NEGATIVE Final    Comment: (NOTE) SARS-CoV-2 target nucleic acids are NOT DETECTED.  The SARS-CoV-2 RNA is generally detectable in upper respiratory specimens during the acute phase of infection. The lowest concentration of SARS-CoV-2 viral copies this assay can detect is 138 copies/mL. A negative result does not preclude SARS-Cov-2 infection and should not be used as the sole basis for treatment or other patient management decisions. A negative result may occur with  improper specimen collection/handling, submission of specimen other than nasopharyngeal swab, presence of viral mutation(s) within the areas targeted by this assay, and inadequate number of viral copies(<138 copies/mL). A negative result must be combined with clinical observations, patient history, and epidemiological information. The expected result is Negative.  Fact Sheet for Patients:  EntrepreneurPulse.com.au  Fact Sheet for Healthcare Providers:  IncredibleEmployment.be  This test is no t yet approved or cleared  by the [place] FDA and  has been authorized for detection and/or diagnosis of SARS-CoV-2 by FDA under an Emergency Use Authorization (EUA). This EUA will remain  in effect (meaning this test can be used) for the duration of the COVID-19 declaration under Section 564(b)(1) of the Act, 21 U.S.C.section 360bbb-3(b)(1), unless the authorization is terminated  or revoked sooner.       Influenza A by PCR NEGATIVE NEGATIVE Final   Influenza B by PCR NEGATIVE NEGATIVE Final  Comment: (NOTE) The Xpert Xpress SARS-CoV-2/FLU/RSV plus assay is intended as an aid in the diagnosis of influenza from Nasopharyngeal swab specimens and should not be used as a sole basis for treatment. Nasal washings and aspirates are unacceptable for Xpert Xpress SARS-CoV-2/FLU/RSV testing.  Fact Sheet for Patients: EntrepreneurPulse.com.au  Fact Sheet for Healthcare Providers: IncredibleEmployment.be  This test is not yet approved or cleared by the [place] FDA and has been authorized for detection and/or diagnosis of SARS-CoV-2 by FDA under an Emergency Use Authorization (EUA). This EUA will remain in effect (meaning this test can be used) for the duration of the COVID-19 declaration under Section 564(b)(1) of the Act, 21 U.S.C. section 360bbb-3(b)(1), unless the authorization is terminated or revoked.  Performed at [facility], [address] [place], [place] [address]   CULTURE, BLOOD (ROUTINE X 2) w Reflex to ID Panel     Status: None (Preliminary result)   Collection Time: 03/28/20  9:14 AM   Specimen: BLOOD  Result Value Ref Range Status   Specimen Description BLOOD RIGHT HAND  Final   Special Requests   Final    BOTTLES DRAWN AEROBIC AND ANAEROBIC Blood Culture results may not be optimal due to an inadequate volume of blood received in culture bottles   Culture   Final    NO GROWTH 3 DAYS Performed at [facility], [address] [place], [place] [address]    Report Status PENDING  Incomplete  CULTURE, BLOOD (ROUTINE X 2) w Reflex to ID Panel     Status: None (Preliminary result)   Collection Time: 03/28/20  9:14 AM   Specimen: BLOOD  Result Value Ref Range Status   Specimen Description BLOOD RIGHT [facility]  Final   Special Requests   Final    BOTTLES DRAWN AEROBIC AND ANAEROBIC Blood Culture adequate volume   Culture   Final    NO GROWTH 3 DAYS Performed at [facility], [address] [place], [place] [address]    Report Status PENDING  Incomplete  MRSA PCR Screening     Status: None   Collection Time: 03/29/20  6:17 AM   Specimen: Nasopharyngeal  Result Value Ref Range Status   MRSA by PCR NEGATIVE NEGATIVE Final    Comment:        The GeneXpert MRSA Assay (FDA approved for NASAL specimens only), is one component of a comprehensive MRSA colonization surveillance program. It is not intended to diagnose MRSA infection nor to guide or monitor treatment for MRSA infections. Performed at [facility], [address], [place],  [address]      Radiology Studies: No results found. Scheduled Meds: . aspirin  324 mg Oral Daily  . docusate sodium  100 mg Oral BID  . enoxaparin (LOVENOX) injection  40 mg Subcutaneous Q24H  . hydrALAZINE  25 mg Oral Q8H  . LORazepam  0.5 mg Intravenous Once   Continuous Infusions: . dextrose 5 % and 0.45 % NaCl with KCl 20 mEq/L 75 mL/hr at 03/31/20 0751     LOS: 3 days    Time spent: 25 mins.    Shawna Clamp, MD [facility]   If 7PM-7AM, please contact night-coverage

## 2020-03-31 NOTE — TOC Transition Note (Signed)
Transition of Care [facility]) - CM/SW Discharge Note   Patient Details  Name: Gerald [name] MRN: [id number] Date of Birth: 1920/05/13  Transition of Care [facility]) CM/SW Contact:  Kerin Salen, RN Phone Number: 03/31/2020, 2:58 PM   Clinical Narrative:  Spoke with patients daughter at bedside, she requested [facility] Lift for home to assist with lifting. Called and made arrangement with [employer], Bethanne Ginger will order for delivery to home. Attending to include in discharge orders. No further TOC needs at this time.           Patient Goals and CMS Choice        Discharge Placement                       Discharge Plan and Services                                     Social Determinants of Health (SDOH) Interventions     Readmission Risk Interventions No flowsheet data found.

## 2020-03-31 NOTE — Care Management Important Message (Signed)
Important Message  Patient Details  Name: Gerald [name] MRN: [id number] Date of Birth: 1920/07/10   Medicare Important Message Given:  Yes     Dannette Barbara 03/31/2020, 1:01 PM

## 2020-03-31 NOTE — Progress Notes (Signed)
   03/31/20 1448  Therapy Vitals  Pulse Rate 66  BP (!) 179/65    Will hold PT at this time, BP continues to run high in BP today.   2:53 PM, 03/31/20 Etta Grandchild, PT, DPT Physical Therapist - [facility]  [phone] [facility])

## 2020-03-31 NOTE — Progress Notes (Signed)
Occupational Therapy Treatment Patient Details Name: Gerald [name] MRN: [id number] DOB: [birth year] Today's Date: 03/31/2020    History of present illness Gerald [name] is a 48EVO who comes to [facility] from cancer center on 1/10 due to recent increased lethargy and low Hgb. Per DTR pt was treated for a cold around Christmas c ABX, since struggled with AMB, been in bed for 3 weeks.  PMH: anemia, AF on aspirin, hemochromatosis, HTN, Left anterior THA March '21 s/po fall, multiple lumbar compression fractures s/p lordoplasty x1. 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 Mild associated edema without mass effect. Abnormal flow related signal within the visualized right vertebral artery, concerning for occlusion or significant stenosis. Recommend CTA to further evaluate. Remote right frontal lobe infarct and dilated perivascular spaces versus remote lacunar infarcts in bilateral basal ganglia." Neurology note from 1/11 also mentions "May have underlying Parkinsonism given cog wheeling and tremor seen on examination. Family relates that prior to March, he had developed a mild shuffling gait."   OT comments  Gerald [name] was seen for OT treatment on this date. Upon arrival to room pt reclined in bed c family at bed side. Family and pt instructed in AE (built up handle provided), HEP (IS provided), and positioning. Pt completes bed level tooth brushing c SETUP + AE. Pt completed 1 set x 50 reps each of BUE bed level exercises as described below. Pt making good progress toward goals. Pt continues to benefit from skilled OT services to maximize return to PLOF and minimize risk of Gerald falls, injury, caregiver burden, and readmission. Will continue to follow POC. Discharge recommendation remains appropriate.     Follow Up Recommendations  Home health OT;Supervision/Assistance - 24 hour    Equipment Recommendations  Other (comment)  (mechanical lift)    Recommendations for Other Services      Precautions / Restrictions Precautions Precautions: Fall Restrictions Weight Bearing Restrictions: No       Mobility Bed Mobility Overal bed mobility: Needs Assistance Bed Mobility: Rolling Rolling: Max assist         General bed mobility comments: uses L rail to pull BUE           ADL either performed or assessed with clinical judgement   ADL Overall ADL's : Needs assistance/impaired                                       General ADL Comments: SETUP and built up handle for tooth brushing at bed level. MAX A rolling for toilet t/f training               Cognition Arousal/Alertness: Awake/alert Behavior During Therapy: WFL for tasks assessed/performed Overall Cognitive Status: Within Functional Limits for tasks assessed                                 General Comments: More alert today; HOH, easily distracted.        Exercises Exercises: General Upper Extremity;Other exercises General Exercises - Upper Extremity Shoulder Flexion: AROM;Strengthening;Both;Other reps (comment);Supine (50) Elbow Flexion: AROM;Strengthening;Both;Other reps (comment);Supine (50) Elbow Extension: AROM;Strengthening;Both;Other reps (comment);Supine (50) Digit Composite Flexion: AROM;Strengthening;Both;Other reps (comment);Supine (50) Other Exercises Other Exercises: Pt and family educated re: DME recs, d/c recs, HEP, IS (provided) frequency and use Other Exercises: Tooth  brushing, rolling           Pertinent Vitals/ Pain       Pain Assessment: Faces Faces Pain Scale: Hurts even more Pain Location: R flank Pain Descriptors / Indicators: Discomfort;Grimacing;Guarding Pain Intervention(s): Limited activity within patient's tolerance;Repositioned         Frequency  Min 1X/week        Progress Toward Goals  OT Goals(current goals can now be found in the care plan section)   Progress towards OT goals: Progressing toward goals  Acute Rehab OT Goals Patient Stated Goal: regain ability to help family members with bed mobility and transfers, maybe take a few steps OT Goal Formulation: With family Time For Goal Achievement: 04/12/20 Potential to Achieve Goals: Fair ADL Goals Pt Will Perform Eating: sitting;with min assist (supported sitting) Pt Will Perform Grooming: with min guard assist;with min assist;sitting (supported sitting) Pt Will Perform Upper Body Dressing: with min assist;with mod assist;sitting (supported sitting)  Plan Discharge plan remains appropriate;Frequency remains appropriate       AM-PAC OT "6 Clicks" Daily Activity     Outcome Measure   Help from another person eating meals?: A Lot Help from another person taking care of personal grooming?: A Lot Help from another person toileting, which includes using toliet, bedpan, or urinal?: Total Help from another person bathing (including washing, rinsing, drying)?: A Lot Help from another person to put on and taking off regular upper body clothing?: A Lot Help from another person to put on and taking off regular lower body clothing?: Total 6 Click Score: 10    End of Session    OT Visit Diagnosis: Unsteadiness on feet (R26.81);Muscle weakness (generalized) (M62.81)   Activity Tolerance Patient tolerated treatment well   Patient Left in bed;with call bell/phone within reach;with bed alarm set;with family/visitor present;with nursing/sitter in room   Nurse Communication Mobility status        Time: 1610-9604 OT Time Calculation (min): 33 min  Charges: OT General Charges $OT Visit: 1 Visit OT Treatments $Self Care/Home Management : 8-22 mins $Therapeutic Exercise: 8-22 mins   Dessie Coma, M.S. OTR/L  03/31/20, 2:27 PM  ascom [phone]

## 2020-04-01 DIAGNOSIS — R531 Weakness: Secondary | ICD-10-CM | POA: Diagnosis not present

## 2020-04-01 MED ORDER — HYDROCODONE-ACETAMINOPHEN 5-325 MG PO TABS
0.5000 | ORAL_TABLET | Freq: Once | ORAL | Status: AC
  Administered 2020-04-01: 0.5 via ORAL
  Filled 2020-04-01: qty 1

## 2020-04-01 NOTE — Progress Notes (Signed)
Physical Therapy Treatment Patient Details Name: Gerald [name] MRN: [id number] DOB: 23-Oct-1920 Today's Date: 04/01/2020    History of Present Illness Gerald [name] is a 28VZC who comes to [facility] from cancer center on 1/10 due to recent increased lethargy and low Hgb. Per DTR pt was treated for a cold around Christmas c ABX, since struggled with AMB, been in bed for 3 weeks.  PMH: anemia, AF on aspirin, hemochromatosis, HTN, Left anterior THA March '21 s/po fall, multiple lumbar compression fractures s/p lordoplasty x1. 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 Mild associated edema without mass effect. Abnormal flow related signal within the visualized right vertebral artery, concerning for occlusion or significant stenosis. Recommend CTA to further evaluate. Remote right frontal lobe infarct and dilated perivascular spaces versus remote lacunar infarcts in bilateral basal ganglia." Neurology note from 1/11 also mentions "May have underlying Parkinsonism given cog wheeling and tremor seen on examination. Family relates that prior to March, he had developed a mild shuffling gait."    PT Comments    PT returned from attempting to treat pt earlier. Pt was asleep upon arriving and very lethargic throughout. Pt's daughter requested not to attempt anything but bed level exercises. Lengthy education/ discussion with pt/pt's daughter about how they would like PT to proceed going forward. Author ask pt if he could have anything, what would he want? He said," To go to heaven." Then quickly drifts back to sleep. Pt was extremely lethargic but did awake enough to participate in some(very little) there ex in bed. Pt's daughter requested PT wait to see pt till he gets home. She wanted the rest of family to see how pt does in home environment prior to making decisions going forward. Did discuss  with CM in morning, about need for hoyer lift at  home. It has been arranged and family will be educated by [facility] services. Acute PT will decrease frequency to 1 x a week.    Follow Up Recommendations  Home health PT;Supervision for mobility/OOB;Other (comment);Supervision/Assistance - 24 hour (Family does not want pt to go to rehab at [place])     Equipment Recommendations  Other (comment) (hoyer lift)    Recommendations for Other Services       Precautions / Restrictions Precautions Precautions: Fall Restrictions Weight Bearing Restrictions: No    Mobility  Bed Mobility Overal bed mobility: Needs Assistance             General bed mobility comments: Pt's daughter did not want therapist attempted sitting up/oob activity. bed level ther ex only         Cognition Arousal/Alertness: Lethargic Behavior During Therapy: Flat affect Overall Cognitive Status: Impaired/Different from baseline (per daughter) Area of Impairment: Orientation;Attention;Memory;Following commands;Safety/judgement;Awareness;Problem solving        General Comments: Pt is extermely lethargic but did participate in very minimal ther ex. Most of session discussing POC going forward.      Exercises General Exercises - Lower Extremity Ankle Circles/Pumps: AROM;10 reps Heel Slides: AAROM Hip ABduction/ADduction: AAROM Straight Leg Raises: AAROM     Prior Function            PT Goals (current goals can now be found in the care plan section) Acute Rehab PT Goals Patient Stated Goal: "go to heaven" Progress towards PT goals: Not progressing toward goals - comment    Frequency    Min 1X/week      PT Plan Frequency  needs to be updated    Co-evaluation     PT goals addressed during session: Strengthening/ROM        AM-PAC PT "6 Clicks" Mobility   Outcome Measure  Help needed turning from your back to your side while in a flat bed without using bedrails?: Total Help needed moving from lying on your back to sitting on the side of a flat  bed without using bedrails?: Total Help needed moving to and from a bed to a chair (including a wheelchair)?: Total Help needed standing up from a chair using your arms (e.g., wheelchair or bedside chair)?: Total Help needed to walk in hospital room?: Total Help needed climbing 3-5 steps with a railing? : Total 6 Click Score: 6    End of Session Equipment Utilized During Treatment: Gait belt Activity Tolerance: Patient limited by fatigue;Patient limited by lethargy;Patient limited by pain Patient left: in bed;with family/visitor present;with nursing/sitter in room Nurse Communication: Mobility status PT Visit Diagnosis: Muscle weakness (generalized) (M62.81);Other abnormalities of gait and mobility (R26.89);Difficulty in walking, not elsewhere classified (R26.2)     Time: 8453-6468 PT Time Calculation (min) (ACUTE ONLY): 25 min  Charges:  $Therapeutic Exercise: 8-22 mins $Therapeutic Activity: 8-22 mins                     Julaine Fusi PTA 04/01/20, 2:04 PM

## 2020-04-01 NOTE — Progress Notes (Signed)
PROGRESS NOTE    Gerald [name]  BTD:[id number] DOB: 1921/02/28 DOA: 03/28/2020 PCP: [name], MD   Brief Narrative:  This 87 yrs old male with PMH significant for HTN, myelodysplastic syndrome, moderate mitral regurgitation, chronic anemia, remote history of prostate or testicular cancer, paroxysmal A. fib who presents with generalized weakness and altered mental status.  History is obtained from daughter since the patient was confused.  Family reports patient had hip replaced little under a year ago and since then family noticed gradual decline.  Patient has been less mobile with less energy and cognitive decline.  He was recently treated for respiratory infection by his primary care physician with antibiotics 3 weeks ago.  Patient has worsening mental status after he completed antibiotics.  Patient was sent from oncologist office for altered mental status.  The abdomen shows finding consistent with proctitis, questionable splenic infarct , Patient started on Zosyn.  MRI shows acute stroke.   Assessment & Plan:   Active Problems:   Anemia   A-fib (HCC)   Essential (primary) hypertension   Hemochromatosis   MDS (myelodysplastic syndrome), low grade (HCC)   Acute proctitis   Acute encephalopathy   Pressure injury of sacral region, stage 2 (HCC)   CVA (cerebral vascular accident) [facility])   Palliative care encounter   Acute encephalopathy could be secondary to acute stroke> improving Etiology unclear at admission. CT Head w/o acute findings. May be infection given mild leukocytosis but no localizing symptoms.  Recent URI but that appears mostly resolved.  CT Abdomen shows possible proctitis maybe 2/2 chronic constipation though report of stooling regularly as of late.  Urinalysis not suggestive of infection. CT does show some signs of fluid overload and breathing comfortably on room air.  Covid negative. Normal glucose, no significant electrolyte abnormalities. No signs hepatic  dysfunction or uremia.  Not on meds associated with this. Could represent end of life condition in this very elderly patient. Full code for now but daughter will discuss w/ other family members, She does think at this point patient would probably want to be DNR MRI Brain: Small acute or early subacute infarct in the right upper pons. Mild associated edema without mass effect. MRSA nares negative, will continue zosyn for now, presume would treat acute proctatitis.  Blood cultures no growth so far, baseline procalcitonin normal.  Likely stop abx in next 24-48 hours. Lactic acid 1.5.  Zosyn discontinued 1/14. Speech/ swallow eval, dysphagia 2 diet.  Elevated troponin: Interstitial edema seen on CT of chest: Patient does have history of moderate mitral regurg. Seen on CTA. No PE. ED gave lasix x1.  Patient appears euvolemic. Echocardiogram shows inferior hypokinesis, regional wall motion abnormalities EF 50 to 55%. Patient denies any chest pain, EKG no ST-T wave changes. Cardiology consulted, patient appears in normal sinus rhythm,  denies any chest pain,   No plan for invasive work-up or further cardiac imaging at this time.   Myelodysplastic syndrome Chronic anemia Mild per onc. Here hgb is actually higher than baseline, perhaps 2/2 mild dehydration - f/u smear  Hypokalemia : Mild, 3.4, replacement in progress,  recheck labs in AM.  Atrial fibrillation, paroxysmal. Not anticoagualted due to fall risk and age.  Hold home aspirin, benefit at this point doubtful No plan for invasive work-up or further cardiac imaging at this time.  Stage 2 pressure ulcer - nursing wound care ordered  splenic infarct? possible splenic infarct seen on CTA. No report of abdominal pain.  - will monitor for now  Proctitis? Does have hx constipation. Doubtful would be cause of encephalopathy if present DC antibiotics.  No need to consult GI.  Cystic lesion in kidney - outpt renal u/s,  consider  HTN: Blood pressure remains elevated. Home medications resumed.  History constipation - cont home colace   DVT prophylaxis: Lovenox Code Status: Full code Family Communication: Daughters were at bedside  Disposition Plan:   Status is: Inpatient  Remains inpatient appropriate because:Inpatient level of care appropriate due to severity of illness   Dispo: The patient is from: Home              Anticipated d/c is to: Home              Anticipated d/c date is: 2 days              Patient currently is not medically stable to d/c.  Consultants:   None  Procedures:  Antimicrobials:  Anti-infectives (From admission, onward)   Start     Dose/Rate Route Frequency Ordered Stop   03/28/20 1400  piperacillin-tazobactam (ZOSYN) IVPB 3.375 g  Status:  Discontinued        3.375 g 12.5 mL/hr over 240 Minutes Intravenous Every 8 hours 03/28/20 0858 03/31/20 1132   03/28/20 0530  piperacillin-tazobactam (ZOSYN) IVPB 3.375 g        3.375 g 100 mL/hr over 30 Minutes Intravenous  Once 03/28/20 0517 03/28/20 3557      Subjective: Patient was seen and examined at bedside.  Overnight events noted.  Patient seems alert and oriented x1.   Patient reports having right-sided back pain,  denies any trauma.  Patient seems more tired and sleepy..  Objective: Vitals:   03/31/20 2101 04/01/20 0434 04/01/20 0555 04/01/20 1339  BP: (!) 156/67 (!) 168/81 (!) 189/82 (!) 169/77  Pulse: 73 78  73  Resp:  20  18  Temp:  98.1 F (36.7 C)  98 F (36.7 C)  TempSrc:  Oral  Oral  SpO2:  99%  100%  Weight:  64.2 kg    Height:        Intake/Output Summary (Last 24 hours) at 04/01/2020 1413 Last data filed at 04/01/2020 0443 Gross per 24 hour  Intake 602.5 ml  Output 400 ml  Net 202.5 ml   Filed Weights   03/30/20 0300 03/31/20 0411 04/01/20 0434  Weight: 65.6 kg 64.9 kg 64.2 kg    Examination:  General exam: Appears calm and comfortable, not in any acute distress. Respiratory  system: Clear to auscultation. Respiratory effort normal. Cardiovascular system: S1 & S2 heard, RRR. No JVD, murmurs, rubs, gallops or clicks. No pedal edema. Gastrointestinal system: Abdomen is nondistended, soft and nontender. No organomegaly or masses felt. Normal bowel sounds heard. Central nervous system: Alert and oriented. No focal neurological deficits. Extremities: Symmetric 5 x 5 power.  No edema, no cyanosis, no clubbing. Skin: No rashes, lesions or ulcers Psychiatry: Judgement and insight appear normal. Mood & affect appropriate.     Data Reviewed: I have personally reviewed following labs and imaging studies  CBC: Recent Labs  Lab 03/27/20 1514 03/29/20 0609 03/30/20 0505 03/31/20 0720  WBC 11.0* 9.3 8.1 9.0  NEUTROABS 7.6  --   --   --   HGB 10.3* 9.4* 9.3* 9.2*  HCT 32.0* 28.9* 28.6* 28.1*  MCV 94.7 93.8 93.5 93.7  PLT 444* 383 408* 391   Basic Metabolic Panel: Recent Labs  Lab 03/27/20 1514 03/28/20 0914 03/29/20 3220 03/30/20 0505 03/31/20 0720  NA 140  --  138 142 142  K 3.4*  --  3.5 3.4* 3.6  CL 103  --  102 106 108  CO2 26  --  26 25 25   GLUCOSE 130*  --  120* 105* 100*  BUN 22  --  23 22 21   CREATININE 0.84  --  0.98 0.91 0.79  CALCIUM 9.8  --  8.9 9.0 8.9  MG  --  1.9  --  1.8 1.8  PHOS  --   --   --  3.8 3.2   GFR: Estimated Creatinine Clearance: 47.9 mL/min (by C-G formula based on SCr of 0.79 mg/dL). Liver Function Tests: Recent Labs  Lab 03/27/20 1514 03/30/20 0505  AST 18 16  ALT 13 11  ALKPHOS 61 54  BILITOT 1.1 0.9  PROT 7.0 6.0*  ALBUMIN 3.7 3.2*   No results for input(s): LIPASE, AMYLASE in the last 168 hours. No results for input(s): AMMONIA in the last 168 hours. Coagulation Profile: No results for input(s): INR, PROTIME in the last 168 hours. Cardiac Enzymes: No results for input(s): CKTOTAL, CKMB, CKMBINDEX, TROPONINI in the last 168 hours. BNP (last 3 results) No results for input(s): PROBNP in the last 8760  hours. HbA1C: No results for input(s): HGBA1C in the last 72 hours. CBG: No results for input(s): GLUCAP in the last 168 hours. Lipid Profile: No results for input(s): CHOL, HDL, LDLCALC, TRIG, CHOLHDL, LDLDIRECT in the last 72 hours. Thyroid Function Tests: No results for input(s): TSH, T4TOTAL, FREET4, T3FREE, THYROIDAB in the last 72 hours. Anemia Panel: No results for input(s): VITAMINB12, FOLATE, FERRITIN, TIBC, IRON, RETICCTPCT in the last 72 hours. Sepsis Labs: Recent Labs  Lab 03/28/20 0914  PROCALCITON <0.10  LATICACIDVEN 1.5    Recent Results (from the past 240 hour(s))  Resp Panel by RT-PCR (Flu A&B, Covid) Nasopharyngeal Swab     Status: None   Collection Time: 03/28/20  3:06 AM   Specimen: Nasopharyngeal Swab; Nasopharyngeal(NP) swabs in vial transport medium  Result Value Ref Range Status   SARS Coronavirus 2 by RT PCR NEGATIVE NEGATIVE Final    Comment: (NOTE) SARS-CoV-2 target nucleic acids are NOT DETECTED.  The SARS-CoV-2 RNA is generally detectable in upper respiratory specimens during the acute phase of infection. The lowest concentration of SARS-CoV-2 viral copies this assay can detect is 138 copies/mL. A negative result does not preclude SARS-Cov-2 infection and should not be used as the sole basis for treatment or other patient management decisions. A negative result may occur with  improper specimen collection/handling, submission of specimen other than nasopharyngeal swab, presence of viral mutation(s) within the areas targeted by this assay, and inadequate number of viral copies(<138 copies/mL). A negative result must be combined with clinical observations, patient history, and epidemiological information. The expected result is Negative.  Fact Sheet for Patients:  EntrepreneurPulse.com.au  Fact Sheet for Healthcare Providers:  IncredibleEmployment.be  This test is no t yet approved or cleared by the [place] FDA and  has been authorized for detection and/or diagnosis of SARS-CoV-2 by FDA under an Emergency Use Authorization (EUA). This EUA will remain  in effect (meaning this test can be used) for the duration of the COVID-19 declaration under Section 564(b)(1) of the Act, 21 U.S.C.section 360bbb-3(b)(1), unless the authorization is terminated  or revoked sooner.       Influenza A by PCR NEGATIVE NEGATIVE Final   Influenza B by PCR NEGATIVE NEGATIVE Final    Comment: (NOTE) The Xpert  Xpress SARS-CoV-2/FLU/RSV plus assay is intended as an aid in the diagnosis of influenza from Nasopharyngeal swab specimens and should not be used as a sole basis for treatment. Nasal washings and aspirates are unacceptable for Xpert Xpress SARS-CoV-2/FLU/RSV testing.  Fact Sheet for Patients: EntrepreneurPulse.com.au  Fact Sheet for Healthcare Providers: IncredibleEmployment.be  This test is not yet approved or cleared by the [place] FDA and has been authorized for detection and/or diagnosis of SARS-CoV-2 by FDA under an Emergency Use Authorization (EUA). This EUA will remain in effect (meaning this test can be used) for the duration of the COVID-19 declaration under Section 564(b)(1) of the Act, 21 U.S.C. section 360bbb-3(b)(1), unless the authorization is terminated or revoked.  Performed at [facility], [address] [place], [place] [address]   CULTURE, BLOOD (ROUTINE X 2) w Reflex to ID Panel     Status: None (Preliminary result)   Collection Time: 03/28/20  9:14 AM   Specimen: BLOOD  Result Value Ref Range Status   Specimen Description BLOOD RIGHT HAND  Final   Special Requests   Final    BOTTLES DRAWN AEROBIC AND ANAEROBIC Blood Culture results may not be optimal due to an inadequate volume of blood received in culture bottles   Culture   Final    NO GROWTH 4 DAYS Performed at [facility], [address]  [place], [place] [address]    Report Status PENDING  Incomplete  CULTURE, BLOOD (ROUTINE X 2) w Reflex to ID Panel     Status: None (Preliminary result)   Collection Time: 03/28/20  9:14 AM   Specimen: BLOOD  Result Value Ref Range Status   Specimen Description BLOOD RIGHT [facility]  Final   Special Requests   Final    BOTTLES DRAWN AEROBIC AND ANAEROBIC Blood Culture adequate volume   Culture   Final    NO GROWTH 4 DAYS Performed at [facility], [address] [place], [place] [address]    Report Status PENDING  Incomplete  MRSA PCR Screening     Status: None   Collection Time: 03/29/20  6:17 AM   Specimen: Nasopharyngeal  Result Value Ref Range Status   MRSA by PCR NEGATIVE NEGATIVE Final    Comment:        The GeneXpert MRSA Assay (FDA approved for NASAL specimens only), is one component of a comprehensive MRSA colonization surveillance program. It is not intended to diagnose MRSA infection nor to guide or monitor treatment for MRSA infections. Performed at [facility], [address] [place], [place] [address]      Radiology Studies: No results found. Scheduled Meds: . aspirin  324 mg Oral Daily  . docusate sodium  100 mg Oral BID  . enoxaparin (LOVENOX) injection  40 mg Subcutaneous Q24H  . hydrALAZINE  25 mg Oral Q8H  . LORazepam  0.5 mg Intravenous Once   Continuous Infusions: . dextrose 5 % and 0.45 % NaCl with KCl 20 mEq/L 75 mL/hr at 04/01/20 0809     LOS: 4 days    Time spent: 25 mins.    Shawna Clamp, MD [facility]   If 7PM-7AM, please contact night-coverage

## 2020-04-01 NOTE — TOC Progression Note (Addendum)
Transition of Care [facility]) - Progression Note    Patient Details  Name: Gerald [name] MRN: [id number] Date of Birth: 1921/03/10  Transition of Care [facility]) CM/SW Contact  Izola Price, RN Phone Number: 04/01/2020, 10:28 AM  Clinical Narrative:    04/01/20 1030.   Possible discharge Monday 04/03/20. No DC today 04/01/20 per provider communication.   Communication with PT per Harrel Lemon need.   Spoke via phone with daughter Rivka Safer, they are going to try [facility] to see if any improvement but may switch to [place] if no improvement.   Requested [facility] agency they have used before.   Daughter also sttated likely to make patient DNR at home.   Palliative following and NP sees patient in home since April.    Contacted DME [employer] to check on [place] that CM contacted via [place] on 03/31/20.   [facility] checking for DME delivery today pending bad weather Sunday/Monday am as this needs to be in place prior to discharge as patient is currently total care.   Helene Kelp at [facility] contacted and will accept patient back with [facility] Tuesday with PT. Needs will be PT/OT/RN/SLP and an aid per family request if possible. Relayed info to [facility]. They suggested SW as well to follow for any transitional needs post discharge.   Confirmed with daughter that family can handle patient needs till Monday.   [employer] confirmed delivery today and given contact number at home [facility] [phone].   Rivka Safer given CM number over weekend is any questions arise.   TOC follow to discharge.   Communicated with provider on this information.  Simmie Davies RN CM  04/01/20 1030   [employer] confirmed delivery of [facility] today 04/01/20.   Antony Odea, daughter in the home, contact information given to [employer] for delivery arrangements. [phone].   Patient will need EMS transport to home on discharge.  Simmie Davies RN CM         Expected Discharge Plan and Services                                                  Social Determinants of Health (SDOH) Interventions    Readmission Risk Interventions No flowsheet data found.

## 2020-04-01 NOTE — Plan of Care (Signed)
Pt alert and oriented to self. Sacral dressing changed per order. BM this shift. Daughter at bedside. Q2 turns. Fall precaution in place. Med crushed and given In apple sauce. Problem: Coping: Goal: Level of anxiety will decrease Outcome: Progressing   Problem: Elimination: Goal: Will not experience complications related to bowel motility Outcome: Progressing   Problem: Pain Managment: Goal: General experience of comfort will improve Outcome: Progressing   Problem: Safety: Goal: Ability to remain free from injury will improve Outcome: Progressing

## 2020-04-02 DIAGNOSIS — R531 Weakness: Secondary | ICD-10-CM | POA: Diagnosis not present

## 2020-04-02 LAB — CULTURE, BLOOD (ROUTINE X 2)
Culture: NO GROWTH
Culture: NO GROWTH
Special Requests: ADEQUATE

## 2020-04-02 MED ORDER — HYDRALAZINE HCL 20 MG/ML IJ SOLN: 5.0000 mg | Freq: Four times a day (QID) | INTRAMUSCULAR | Status: DC | PRN

## 2020-04-02 MED ORDER — HYDRALAZINE HCL 50 MG PO TABS
50.0000 mg | ORAL_TABLET | Freq: Three times a day (TID) | ORAL | Status: DC
  Administered 2020-04-02 – 2020-04-04 (×6): 50 mg via ORAL
  Filled 2020-04-02 (×6): qty 1

## 2020-04-02 NOTE — Plan of Care (Signed)
  Problem: Education: Goal: Knowledge of General Education information will improve Description: Including pain rating scale, medication(s)/side effects and non-pharmacologic comfort measures Outcome: Progressing   Problem: Safety: Goal: Ability to remain free from injury will improve Outcome: Progressing   

## 2020-04-02 NOTE — Progress Notes (Signed)
PROGRESS NOTE    Gerald [name]  YQM:[id number] DOB: 19-Feb-1921 DOA: 03/28/2020 PCP: [name], MD   Brief Narrative:  This 87 yrs old male with PMH significant for HTN, myelodysplastic syndrome, moderate mitral regurgitation, chronic anemia, remote history of prostate or testicular cancer, paroxysmal A. fib who presents with generalized weakness and altered mental status.  History is obtained from daughter since the patient was confused.  Family reports patient had hip replaced little under a year ago and since then family noticed gradual decline.  Patient has been less mobile with less energy and cognitive decline.  He was recently treated for respiratory infection by his primary care physician with antibiotics 3 weeks ago.  Patient has worsening mental status after he completed antibiotics.  Patient was sent from oncologist office for altered mental status.  The abdomen shows finding consistent with proctitis, questionable splenic infarct , Patient started on Zosyn.  MRI shows acute stroke.   Assessment & Plan:   Active Problems:   Anemia   A-fib (HCC)   Essential (primary) hypertension   Hemochromatosis   MDS (myelodysplastic syndrome), low grade (HCC)   Acute proctitis   Acute encephalopathy   Pressure injury of sacral region, stage 2 (HCC)   CVA (cerebral vascular accident) [facility])   Palliative care encounter   Acute encephalopathy could be secondary to acute stroke> improved Etiology unclear at admission. CT Head w/o acute findings. May be infection given mild leukocytosis but no localizing symptoms.  Recent URI but that appears mostly resolved.  CT Abdomen shows possible proctitis maybe 2/2 chronic constipation though report of stooling regularly as of late.  Urinalysis not suggestive of infection. CT does show some signs of fluid overload and breathing comfortably on room air.  Covid negative. Normal glucose, no significant electrolyte abnormalities. No signs hepatic  dysfunction or uremia.  Not on meds associated with this. Could represent end of life condition in this very elderly patient. Full code for now but daughter will discuss w/ other family members, She does think at this point patient would probably want to be DNR MRI Brain: Small acute or early subacute infarct in the right upper pons. Mild associated edema without mass effect. MRSA nares negative, will continue zosyn for now, presume would treat acute proctatitis.  Blood cultures no growth so far, baseline procalcitonin normal.  Likely stop abx in next 24-48 hours. Lactic acid 1.5.  Zosyn discontinued 1/14. Speech/ swallow eval, recommended dysphagia 2 diet.  Elevated troponin: Interstitial edema seen on CT of chest: Patient does have history of moderate mitral regurg. Seen on CTA. No PE. ED gave lasix x1.  Patient appears euvolemic. Echocardiogram shows inferior hypokinesis, regional wall motion abnormalities EF 50 to 55%. Patient denies any chest pain, EKG no ST-T wave changes. Cardiology consulted, patient appears in normal sinus rhythm,  denies any chest pain,   No plan for invasive work-up or further cardiac imaging at this time.   Myelodysplastic syndrome Chronic anemia Mild per onc. Hb remains stable.   Hypokalemia : Resolved. Mild, 3.4, replacement in progress,  recheck labs in AM.  Atrial fibrillation, paroxysmal. Not anticoagualted due to fall risk and advanced age.  Hold home aspirin, benefit at this point doubtful No plan for invasive work-up or further cardiac imaging at this time.  Stage 2 pressure ulcer - nursing wound care   splenic infarct? possible splenic infarct seen on CTA. No report of abdominal pain.  - will monitor for now  Proctitis? Does have hx constipation. Doubtful  would be cause of encephalopathy if present DC antibiotics.  No need to consult GI.  Cystic lesion in kidney - outpt renal u/s, consider  HTN: Blood pressure remains  elevated. Home medications resumed.  History constipation - cont home colace   DVT prophylaxis: Lovenox Code Status: Full code Family Communication: Daughters were at bedside  Disposition Plan:   Status is: Inpatient  Remains inpatient appropriate because:Inpatient level of care appropriate due to severity of illness   Dispo: The patient is from: Home              Anticipated d/c is to: Home              Anticipated d/c date is: 2 days              Patient currently is not medically stable to d/c.  Consultants:   None  Procedures:  Antimicrobials:  Anti-infectives (From admission, onward)   Start     Dose/Rate Route Frequency Ordered Stop   03/28/20 1400  piperacillin-tazobactam (ZOSYN) IVPB 3.375 g  Status:  Discontinued        3.375 g 12.5 mL/hr over 240 Minutes Intravenous Every 8 hours 03/28/20 0858 03/31/20 1132   03/28/20 0530  piperacillin-tazobactam (ZOSYN) IVPB 3.375 g        3.375 g 100 mL/hr over 30 Minutes Intravenous  Once 03/28/20 0517 03/28/20 [id number]      Subjective: Patient was seen and examined at bedside. Overnight events noted.  Patient seems alert and oriented x 2.  He is following commands, responds appropriately . Daughter at bed side, answered all questions.  Objective: Vitals:   04/01/20 2002 04/02/20 0229 04/02/20 0546 04/02/20 0934  BP: (!) 172/70  (!) 179/87 (!) 185/82  Pulse: 75  75 71  Resp: 18  18 14   Temp: 98.6 F (37 C)  98.2 F (36.8 C) 97.7 F (36.5 C)  TempSrc: Oral   Oral  SpO2: 98%  99% 100%  Weight:  67.1 kg    Height:       No intake or output data in the 24 hours ending 04/02/20 1235 Filed Weights   03/31/20 0411 04/01/20 0434 04/02/20 0229  Weight: 64.9 kg 64.2 kg 67.1 kg    Examination:  General exam: Appears calm and comfortable, not in any acute distress. Respiratory system: Clear to auscultation. Respiratory effort normal. Cardiovascular system: S1 & S2 heard, RRR. No JVD, murmurs, rubs, gallops or clicks.  No pedal edema. Gastrointestinal system: Abdomen is nondistended, soft and nontender. No organomegaly or masses felt. Normal bowel sounds heard. Central nervous system: Alert and oriented. No focal neurological deficits. Extremities: Symmetric 5 x 5 power.  No edema, no cyanosis, no clubbing. Skin: No rashes, lesions or ulcers Psychiatry: Judgement and insight appear normal. Mood & affect appropriate.     Data Reviewed: I have personally reviewed following labs and imaging studies  CBC: Recent Labs  Lab 03/27/20 1514 03/29/20 0609 03/30/20 0505 03/31/20 0720  WBC 11.0* 9.3 8.1 9.0  NEUTROABS 7.6  --   --   --   HGB 10.3* 9.4* 9.3* 9.2*  HCT 32.0* 28.9* 28.6* 28.1*  MCV 94.7 93.8 93.5 93.7  PLT 444* 383 408* 0000000   Basic Metabolic Panel: Recent Labs  Lab 03/27/20 1514 03/28/20 0914 03/29/20 0609 03/30/20 0505 03/31/20 0720  NA 140  --  138 142 142  K 3.4*  --  3.5 3.4* 3.6  CL 103  --  102 106 108  CO2  26  --  26 25 25   GLUCOSE 130*  --  120* 105* 100*  BUN 22  --  23 22 21   CREATININE 0.84  --  0.98 0.91 0.79  CALCIUM 9.8  --  8.9 9.0 8.9  MG  --  1.9  --  1.8 1.8  PHOS  --   --   --  3.8 3.2   GFR: Estimated Creatinine Clearance: 50.1 mL/min (by C-G formula based on SCr of 0.79 mg/dL). Liver Function Tests: Recent Labs  Lab 03/27/20 1514 03/30/20 0505  AST 18 16  ALT 13 11  ALKPHOS 61 54  BILITOT 1.1 0.9  PROT 7.0 6.0*  ALBUMIN 3.7 3.2*   No results for input(s): LIPASE, AMYLASE in the last 168 hours. No results for input(s): AMMONIA in the last 168 hours. Coagulation Profile: No results for input(s): INR, PROTIME in the last 168 hours. Cardiac Enzymes: No results for input(s): CKTOTAL, CKMB, CKMBINDEX, TROPONINI in the last 168 hours. BNP (last 3 results) No results for input(s): PROBNP in the last 8760 hours. HbA1C: No results for input(s): HGBA1C in the last 72 hours. CBG: No results for input(s): GLUCAP in the last 168 hours. Lipid  Profile: No results for input(s): CHOL, HDL, LDLCALC, TRIG, CHOLHDL, LDLDIRECT in the last 72 hours. Thyroid Function Tests: No results for input(s): TSH, T4TOTAL, FREET4, T3FREE, THYROIDAB in the last 72 hours. Anemia Panel: No results for input(s): VITAMINB12, FOLATE, FERRITIN, TIBC, IRON, RETICCTPCT in the last 72 hours. Sepsis Labs: Recent Labs  Lab 03/28/20 0914  PROCALCITON <0.10  LATICACIDVEN 1.5    Recent Results (from the past 240 hour(s))  Resp Panel by RT-PCR (Flu A&B, Covid) Nasopharyngeal Swab     Status: None   Collection Time: 03/28/20  3:06 AM   Specimen: Nasopharyngeal Swab; Nasopharyngeal(NP) swabs in vial transport medium  Result Value Ref Range Status   SARS Coronavirus 2 by RT PCR NEGATIVE NEGATIVE Final    Comment: (NOTE) SARS-CoV-2 target nucleic acids are NOT DETECTED.  The SARS-CoV-2 RNA is generally detectable in upper respiratory specimens during the acute phase of infection. The lowest concentration of SARS-CoV-2 viral copies this assay can detect is 138 copies/mL. A negative result does not preclude SARS-Cov-2 infection and should not be used as the sole basis for treatment or other patient management decisions. A negative result may occur with  improper specimen collection/handling, submission of specimen other than nasopharyngeal swab, presence of viral mutation(s) within the areas targeted by this assay, and inadequate number of viral copies(<138 copies/mL). A negative result must be combined with clinical observations, patient history, and epidemiological information. The expected result is Negative.  Fact Sheet for Patients:  EntrepreneurPulse.com.au  Fact Sheet for Healthcare Providers:  IncredibleEmployment.be  This test is no t yet approved or cleared by the [place] FDA and  has been authorized for detection and/or diagnosis of SARS-CoV-2 by FDA under an Emergency Use Authorization (EUA). This  EUA will remain  in effect (meaning this test can be used) for the duration of the COVID-19 declaration under Section 564(b)(1) of the Act, 21 U.S.C.section 360bbb-3(b)(1), unless the authorization is terminated  or revoked sooner.       Influenza A by PCR NEGATIVE NEGATIVE Final   Influenza B by PCR NEGATIVE NEGATIVE Final    Comment: (NOTE) The Xpert Xpress SARS-CoV-2/FLU/RSV plus assay is intended as an aid in the diagnosis of influenza from Nasopharyngeal swab specimens and should not be used as a sole basis for  treatment. Nasal washings and aspirates are unacceptable for Xpert Xpress SARS-CoV-2/FLU/RSV testing.  Fact Sheet for Patients: EntrepreneurPulse.com.au  Fact Sheet for Healthcare Providers: IncredibleEmployment.be  This test is not yet approved or cleared by the [place] FDA and has been authorized for detection and/or diagnosis of SARS-CoV-2 by FDA under an Emergency Use Authorization (EUA). This EUA will remain in effect (meaning this test can be used) for the duration of the COVID-19 declaration under Section 564(b)(1) of the Act, 21 U.S.C. section 360bbb-3(b)(1), unless the authorization is terminated or revoked.  Performed at [facility], [address] [place], [place] [address]   CULTURE, BLOOD (ROUTINE X 2) w Reflex to ID Panel     Status: None   Collection Time: 03/28/20  9:14 AM   Specimen: BLOOD  Result Value Ref Range Status   Specimen Description BLOOD RIGHT HAND  Final   Special Requests   Final    BOTTLES DRAWN AEROBIC AND ANAEROBIC Blood Culture results may not be optimal due to an inadequate volume of blood received in culture bottles   Culture   Final    NO GROWTH 5 DAYS Performed at [facility], [address] [place], [place] [address]    Report Status 04/02/2020 FINAL  Final  CULTURE, BLOOD (ROUTINE X 2) w Reflex to ID Panel     Status: None   Collection Time: 03/28/20   9:14 AM   Specimen: BLOOD  Result Value Ref Range Status   Specimen Description BLOOD RIGHT [facility]  Final   Special Requests   Final    BOTTLES DRAWN AEROBIC AND ANAEROBIC Blood Culture adequate volume   Culture   Final    NO GROWTH 5 DAYS Performed at [facility], [address] [place], [place] [address]    Report Status 04/02/2020 FINAL  Final  MRSA PCR Screening     Status: None   Collection Time: 03/29/20  6:17 AM   Specimen: Nasopharyngeal  Result Value Ref Range Status   MRSA by PCR NEGATIVE NEGATIVE Final    Comment:        The GeneXpert MRSA Assay (FDA approved for NASAL specimens only), is one component of a comprehensive MRSA colonization surveillance program. It is not intended to diagnose MRSA infection nor to guide or monitor treatment for MRSA infections. Performed at [facility], [address], [place], [place] [address]      Radiology Studies: No results found. Scheduled Meds: . aspirin  324 mg Oral Daily  . docusate sodium  100 mg Oral BID  . enoxaparin (LOVENOX) injection  40 mg Subcutaneous Q24H  . hydrALAZINE  50 mg Oral Q8H  . LORazepam  0.5 mg Intravenous Once   Continuous Infusions: . dextrose 5 % and 0.45 % NaCl with KCl 20 mEq/L 75 mL/hr at 04/02/20 1131     LOS: 5 days    Time spent: 25 mins.    Shawna Clamp, MD [facility]   If 7PM-7AM, please contact night-coverage

## 2020-04-03 DIAGNOSIS — R531 Weakness: Secondary | ICD-10-CM | POA: Diagnosis not present

## 2020-04-03 LAB — BASIC METABOLIC PANEL
Anion gap: 7 (ref 5–15)
BUN: 26 mg/dL — ABNORMAL HIGH (ref 8–23)
CO2: 23 mmol/L (ref 22–32)
Calcium: 8.8 mg/dL — ABNORMAL LOW (ref 8.9–10.3)
Chloride: 109 mmol/L (ref 98–111)
Creatinine, Ser: 0.75 mg/dL (ref 0.61–1.24)
GFR, Estimated: >60 mL/min (ref >60)
Glucose, Bld: 104 mg/dL — ABNORMAL HIGH (ref 70–99)
Potassium: 3.7 mmol/L (ref 3.5–5.1)
Sodium: 139 mmol/L (ref 135–145)

## 2020-04-03 LAB — CBC
HCT: 28 % — ABNORMAL LOW (ref 39.0–52.0)
Hemoglobin: 9.1 g/dL — ABNORMAL LOW (ref 13.0–17.0)
MCH: 30.5 pg (ref 26.0–34.0)
MCHC: 32.5 g/dL (ref 30.0–36.0)
MCV: 94 fL (ref 80.0–100.0)
Platelets: 378 10*3/uL (ref 150–400)
RBC: 2.98 MIL/uL — ABNORMAL LOW (ref 4.22–5.81)
RDW: 21.5 % — ABNORMAL HIGH (ref 11.5–15.5)
WBC: 8.3 10*3/uL (ref 4.0–10.5)
nRBC: 0.2 % (ref 0.0–0.2)

## 2020-04-03 LAB — PHOSPHORUS: Phosphorus: 3.4 mg/dL (ref 2.5–4.6)

## 2020-04-03 LAB — MAGNESIUM: Magnesium: 1.8 mg/dL (ref 1.7–2.4)

## 2020-04-03 NOTE — Progress Notes (Signed)
Occupational Therapy Treatment Patient Details Name: Gerald [name] MRN: [id number] DOB: 09-17-1920 Today's Date: 04/03/2020    History of present illness Gerald [name] is a 02IOX who [facility] cancer center on 1/10 due to recent increased lethargy and low Hgb. Per DTR pt was treated for a cold around Christmas c ABX, since struggled with AMB, been in bed for 3 weeks.  PMH: anemia, AF on aspirin, hemochromatosis, HTN, Left anterior THA March '21 s/po fall, multiple lumbar compression fractures s/p lordoplasty x1. 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 Mild associated edema without mass effect. Abnormal flow related signal within the visualized right vertebral artery, concerning for occlusion or significant stenosis. Recommend CTA to further evaluate. Remote right frontal lobe infarct and dilated perivascular spaces versus remote lacunar infarcts in bilateral basal ganglia." Neurology note from 1/11 also mentions "May have underlying Parkinsonism given cog wheeling and tremor seen on examination. Family relates that prior to March, he had developed a mild shuffling gait."   OT comments  Mr Pavek was seen for OT treatment on this date. Upon arrival to room pt reclined in bed with daughter at bedside assisting with breakfast. DTR reports pt usually has most energy in AM and has been self-feeding breakfast. OT provided SETUP and MIN A to complete self-feeding at bed level - assist for scooping (pt would benefit from adapted dining equipment such as scooper bowls/plates, DTR made aware). Pt required MAX A to adjust torso 2/2 R lateral lean. DTR and OT engaged in discussion on DME recs and educated on home/routines modifications. DTR reports goal of pt standing and walking to the bathroom again. Pt making progress toward goals. Pt continues to benefit from skilled OT services to maximize return to PLOF and minimize risk  of future falls, injury, caregiver burden, and readmission. Will continue to follow POC. Discharge recommendation remains appropriate.    Follow Up Recommendations  Home health OT;Supervision/Assistance - 24 hour    Equipment Recommendations  Other (comment) (mechanical lift)    Recommendations for Other Services      Precautions / Restrictions Precautions Precautions: Fall Restrictions Weight Bearing Restrictions: No       Mobility Bed Mobility Overal bed mobility: Needs Assistance             General bed mobility comments: MAX A adjust torso at bed level             ADL either performed or assessed with clinical judgement   ADL Overall ADL's : Needs assistance/impaired                                       General ADL Comments: SETUP and MIN A self-feeding at bed level - assist for scooping (pt would benefit from adapted dining equipment such as scooper bowls/plates).               Cognition Arousal/Alertness: Awake/alert Behavior During Therapy: Flat affect Overall Cognitive Status: Within Functional Limits for tasks assessed                                          Exercises Exercises: Other exercises Other Exercises Other Exercises: Pt and family educated re: DME recs, d/c recs, falls prevention, ECS,  adapted dining equipment Other Exercises: self-feeding, bed mobility           Pertinent Vitals/ Pain       Pain Assessment: No/denies pain         Frequency  Min 1X/week        Progress Toward Goals  OT Goals(current goals can now be found in the care plan section)  Progress towards OT goals: Progressing toward goals  Acute Rehab OT Goals Patient Stated Goal: "go to heaven" OT Goal Formulation: With family Time For Goal Achievement: 04/12/20 Potential to Achieve Goals: Fair ADL Goals Pt Will Perform Eating: sitting;with min assist Pt Will Perform Grooming: with min guard assist;with min  assist;sitting Pt Will Perform Upper Body Dressing: with min assist;with mod assist;sitting  Plan Discharge plan remains appropriate;Frequency remains appropriate       AM-PAC OT "6 Clicks" Daily Activity     Outcome Measure   Help from another person eating meals?: A Little Help from another person taking care of personal grooming?: A Little Help from another person toileting, which includes using toliet, bedpan, or urinal?: Total Help from another person bathing (including washing, rinsing, drying)?: A Lot Help from another person to put on and taking off regular upper body clothing?: A Lot Help from another person to put on and taking off regular lower body clothing?: Total 6 Click Score: 12    End of Session    OT Visit Diagnosis: Unsteadiness on feet (R26.81);Muscle weakness (generalized) (M62.81)   Activity Tolerance Patient tolerated treatment well   Patient Left in bed;with call bell/phone within reach;with bed alarm set;with family/visitor present;with nursing/sitter in room   Nurse Communication          Time: 2878-6767 OT Time Calculation (min): 22 min  Charges: OT General Charges $OT Visit: 1 Visit OT Treatments $Self Care/Home Management : 8-22 mins  Dessie Coma, M.S. OTR/L  04/03/20, 1:41 PM  ascom [phone]

## 2020-04-03 NOTE — Care Management Important Message (Signed)
Important Message  Patient Details  Name: Gerald [name] MRN: [id number] Date of Birth: Jul 24, 1920   Medicare Important Message Given:  Yes     Dannette Barbara 04/03/2020, 12:19 PM

## 2020-04-03 NOTE — Progress Notes (Signed)
PROGRESS NOTE    Gerald [name]  YQI:[id number] DOB: [birth year] DOA: 03/28/2020 PCP: [name], MD   Brief Narrative:  This 87 yrs old male with PMH significant for HTN, myelodysplastic syndrome, moderate mitral regurgitation, chronic anemia, remote history of prostate or testicular cancer, paroxysmal A. fib who presents with generalized weakness and altered mental status.  History is obtained from daughter since the patient was confused.  Family reports patient had hip replaced little under a year ago and since then family noticed gradual decline.  Patient has been less mobile with less energy and cognitive decline.  He was recently treated for respiratory infection by his primary care physician with antibiotics 3 weeks ago.  Patient has worsening mental status after he completed antibiotics.  Patient was sent from oncologist office for altered mental status.  The abdomen shows finding consistent with proctitis, questionable splenic infarct , Patient started on Zosyn.  MRI shows acute stroke.   Assessment & Plan:   Active Problems:   Anemia   A-fib (HCC)   Essential (primary) hypertension   Hemochromatosis   MDS (myelodysplastic syndrome), low grade (HCC)   Acute proctitis   Acute encephalopathy   Pressure injury of sacral region, stage 2 (HCC)   CVA (cerebral vascular accident) [facility])   Palliative care encounter   Acute encephalopathy could be secondary to acute stroke> improved Etiology unclear at admission. CT Head w/o acute findings. May be infection given mild leukocytosis but no localizing symptoms.  Recent URI but that appears mostly resolved.  CT Abdomen shows possible proctitis maybe 2/2 chronic constipation though report of stooling regularly as of late.  Urinalysis not suggestive of infection. CT does show some signs of fluid overload and breathing comfortably on room air.  Covid negative. Normal glucose, no significant electrolyte abnormalities. No signs hepatic  dysfunction or uremia.  Not on meds associated with this. Could represent end of life condition in this very elderly patient. Full code for now but daughter will discuss w/ other family members, She does think at this point patient would probably want to be DNR MRI Brain: Small acute or early subacute infarct in the right upper pons. Mild associated edema without mass effect. MRSA nares negative, will continue zosyn for now, presume would treat acute proctatitis.  Blood cultures no growth so far, baseline procalcitonin normal.  Likely stop abx in next 24-48 hours. Lactic acid 1.5.  Zosyn discontinued 1/14. Speech/ swallow eval, recommended dysphagia 2 diet.  Elevated troponin: Interstitial edema seen on CT of chest: Patient does have history of moderate mitral regurg. Seen on CTA. No PE. ED gave lasix x1.  Patient appears euvolemic. Echocardiogram shows inferior hypokinesis, regional wall motion abnormalities EF 50 to 55%. Patient denies any chest pain, EKG no ST-T wave changes. Cardiology consulted, patient appears in normal sinus rhythm,  denies any chest pain,   No plan for invasive work-up or further cardiac imaging at this time.   Myelodysplastic syndrome Chronic anemia Mild per onc. Hb remains stable.   Hypokalemia : Resolved. Mild, 3.4, replacement in progress,  recheck labs in AM.  Atrial fibrillation, paroxysmal. Not anticoagualted due to fall risk and advanced age.  Hold home aspirin, benefit at this point doubtful No plan for invasive work-up or further cardiac imaging at this time.  Stage 2 pressure ulcer - nursing wound care.  splenic infarct? possible splenic infarct seen on CTA. No report of abdominal pain.  - will monitor for now  Proctitis? Does have hx constipation. Doubtful would  be cause of encephalopathy if present DC antibiotics.  No need to consult GI.  Cystic lesion in kidney - outpt renal u/s, consider  HTN: Blood pressure remains  elevated. Home medications resumed.  History constipation - cont home colace   DVT prophylaxis: Lovenox Code Status: Full code Family Communication: Daughters were at bedside  Disposition Plan:   Status is: Inpatient  Remains inpatient appropriate because:Inpatient level of care appropriate due to severity of illness   Dispo: The patient is from: Home              Anticipated d/c is to: Home              Anticipated d/c date is: 04/04/2020              Patient currently is not medically stable to d/c.  Consultants:   None  Procedures:  None.  Antimicrobials:  Anti-infectives (From admission, onward)   Start     Dose/Rate Route Frequency Ordered Stop   03/28/20 1400  piperacillin-tazobactam (ZOSYN) IVPB 3.375 g  Status:  Discontinued        3.375 g 12.5 mL/hr over 240 Minutes Intravenous Every 8 hours 03/28/20 0858 03/31/20 1132   03/28/20 0530  piperacillin-tazobactam (ZOSYN) IVPB 3.375 g        3.375 g 100 mL/hr over 30 Minutes Intravenous  Once 03/28/20 0517 03/28/20 [id number]      Subjective: Patient was seen and examined at bedside. Overnight events noted.  Patient seems alert and oriented x 2.  He is following commands, responds appropriately,  daughter is at bedside, all the questions answered.  Objective: Vitals:   04/03/20 0239 04/03/20 0454 04/03/20 0733 04/03/20 1148  BP:  (!) 179/64 (!) 187/82 (!) 178/73  Pulse:  71 80 86  Resp:      Temp:  97.8 F (36.6 C) 97.9 F (36.6 C) 98.1 F (36.7 C)  TempSrc:  Oral Oral Oral  SpO2:  99% 98% 100%  Weight: 66 kg     Height:        Intake/Output Summary (Last 24 hours) at 04/03/2020 1340 Last data filed at 04/03/2020 1227 Gross per 24 hour  Intake 120 ml  Output 1950 ml  Net -1830 ml   Filed Weights   04/01/20 0434 04/02/20 0229 04/03/20 0239  Weight: 64.2 kg 67.1 kg 66 kg    Examination:  General exam: Appears calm and comfortable, not in any acute distress. Respiratory system: Clear to  auscultation. Respiratory effort normal. Cardiovascular system: S1 & S2 heard, RRR. No JVD, murmurs, rubs, gallops or clicks. No pedal edema. Gastrointestinal system: Abdomen is nondistended, soft and nontender. No organomegaly or masses felt. Normal bowel sounds heard. Central nervous system: Alert and oriented. No focal neurological deficits. Extremities: Symmetric 5 x 5 power.  No edema, no cyanosis, no clubbing. Skin: No rashes, lesions or ulcers Psychiatry: Judgement and insight appear normal. Mood & affect appropriate.     Data Reviewed: I have personally reviewed following labs and imaging studies  CBC: Recent Labs  Lab 03/27/20 1514 03/29/20 0609 03/30/20 0505 03/31/20 0720 04/03/20 0324  WBC 11.0* 9.3 8.1 9.0 8.3  NEUTROABS 7.6  --   --   --   --   HGB 10.3* 9.4* 9.3* 9.2* 9.1*  HCT 32.0* 28.9* 28.6* 28.1* 28.0*  MCV 94.7 93.8 93.5 93.7 94.0  PLT 444* 383 408* 391 [id number]   Basic Metabolic Panel: Recent Labs  Lab 03/27/20 1514 03/28/20 0914 03/29/20 0609 03/30/20  0505 03/31/20 0720 04/03/20 0324  NA 140  --  138 142 142 139  K 3.4*  --  3.5 3.4* 3.6 3.7  CL 103  --  102 106 108 109  CO2 26  --  26 25 25 23   GLUCOSE 130*  --  120* 105* 100* 104*  BUN 22  --  23 22 21  26*  CREATININE 0.84  --  0.98 0.91 0.79 0.75  CALCIUM 9.8  --  8.9 9.0 8.9 8.8*  MG  --  1.9  --  1.8 1.8 1.8  PHOS  --   --   --  3.8 3.2 3.4   GFR: Estimated Creatinine Clearance: 49.3 mL/min (by C-G formula based on SCr of 0.75 mg/dL). Liver Function Tests: Recent Labs  Lab 03/27/20 1514 03/30/20 0505  AST 18 16  ALT 13 11  ALKPHOS 61 54  BILITOT 1.1 0.9  PROT 7.0 6.0*  ALBUMIN 3.7 3.2*   No results for input(s): LIPASE, AMYLASE in the last 168 hours. No results for input(s): AMMONIA in the last 168 hours. Coagulation Profile: No results for input(s): INR, PROTIME in the last 168 hours. Cardiac Enzymes: No results for input(s): CKTOTAL, CKMB, CKMBINDEX, TROPONINI in the last 168  hours. BNP (last 3 results) No results for input(s): PROBNP in the last 8760 hours. HbA1C: No results for input(s): HGBA1C in the last 72 hours. CBG: No results for input(s): GLUCAP in the last 168 hours. Lipid Profile: No results for input(s): CHOL, HDL, LDLCALC, TRIG, CHOLHDL, LDLDIRECT in the last 72 hours. Thyroid Function Tests: No results for input(s): TSH, T4TOTAL, FREET4, T3FREE, THYROIDAB in the last 72 hours. Anemia Panel: No results for input(s): VITAMINB12, FOLATE, FERRITIN, TIBC, IRON, RETICCTPCT in the last 72 hours. Sepsis Labs: Recent Labs  Lab 03/28/20 0914  PROCALCITON <0.10  LATICACIDVEN 1.5    Recent Results (from the past 240 hour(s))  Resp Panel by RT-PCR (Flu A&B, Covid) Nasopharyngeal Swab     Status: None   Collection Time: 03/28/20  3:06 AM   Specimen: Nasopharyngeal Swab; Nasopharyngeal(NP) swabs in vial transport medium  Result Value Ref Range Status   SARS Coronavirus 2 by RT PCR NEGATIVE NEGATIVE Final    Comment: (NOTE) SARS-CoV-2 target nucleic acids are NOT DETECTED.  The SARS-CoV-2 RNA is generally detectable in upper respiratory specimens during the acute phase of infection. The lowest concentration of SARS-CoV-2 viral copies this assay can detect is 138 copies/mL. A negative result does not preclude SARS-Cov-2 infection and should not be used as the sole basis for treatment or other patient management decisions. A negative result may occur with  improper specimen collection/handling, submission of specimen other than nasopharyngeal swab, presence of viral mutation(s) within the areas targeted by this assay, and inadequate number of viral copies(<138 copies/mL). A negative result must be combined with clinical observations, patient history, and epidemiological information. The expected result is Negative.  Fact Sheet for Patients:  EntrepreneurPulse.com.au  Fact Sheet for Healthcare Providers:   IncredibleEmployment.be  This test is no t yet approved or cleared by the [place] FDA and  has been authorized for detection and/or diagnosis of SARS-CoV-2 by FDA under an Emergency Use Authorization (EUA). This EUA will remain  in effect (meaning this test can be used) for the duration of the COVID-19 declaration under Section 564(b)(1) of the Act, 21 U.S.C.section 360bbb-3(b)(1), unless the authorization is terminated  or revoked sooner.       Influenza A by PCR NEGATIVE NEGATIVE Final  Influenza B by PCR NEGATIVE NEGATIVE Final    Comment: (NOTE) The Xpert Xpress SARS-CoV-2/FLU/RSV plus assay is intended as an aid in the diagnosis of influenza from Nasopharyngeal swab specimens and should not be used as a sole basis for treatment. Nasal washings and aspirates are unacceptable for Xpert Xpress SARS-CoV-2/FLU/RSV testing.  Fact Sheet for Patients: EntrepreneurPulse.com.au  Fact Sheet for Healthcare Providers: IncredibleEmployment.be  This test is not yet approved or cleared by the [place] FDA and has been authorized for detection and/or diagnosis of SARS-CoV-2 by FDA under an Emergency Use Authorization (EUA). This EUA will remain in effect (meaning this test can be used) for the duration of the COVID-19 declaration under Section 564(b)(1) of the Act, 21 U.S.C. section 360bbb-3(b)(1), unless the authorization is terminated or revoked.  Performed at [facility], [address] [place], [place] [address]   CULTURE, BLOOD (ROUTINE X 2) w Reflex to ID Panel     Status: None   Collection Time: 03/28/20  9:14 AM   Specimen: BLOOD  Result Value Ref Range Status   Specimen Description BLOOD RIGHT HAND  Final   Special Requests   Final    BOTTLES DRAWN AEROBIC AND ANAEROBIC Blood Culture results may not be optimal due to an inadequate volume of blood received in culture bottles   Culture   Final     NO GROWTH 5 DAYS Performed at [facility], [address] [place], [place] [address]    Report Status 04/02/2020 FINAL  Final  CULTURE, BLOOD (ROUTINE X 2) w Reflex to ID Panel     Status: None   Collection Time: 03/28/20  9:14 AM   Specimen: BLOOD  Result Value Ref Range Status   Specimen Description BLOOD RIGHT [facility]  Final   Special Requests   Final    BOTTLES DRAWN AEROBIC AND ANAEROBIC Blood Culture adequate volume   Culture   Final    NO GROWTH 5 DAYS Performed at [facility], [address] [place], [place] [address]    Report Status 04/02/2020 FINAL  Final  MRSA PCR Screening     Status: None   Collection Time: 03/29/20  6:17 AM   Specimen: Nasopharyngeal  Result Value Ref Range Status   MRSA by PCR NEGATIVE NEGATIVE Final    Comment:        The GeneXpert MRSA Assay (FDA approved for NASAL specimens only), is one component of a comprehensive MRSA colonization surveillance program. It is not intended to diagnose MRSA infection nor to guide or monitor treatment for MRSA infections. Performed at [facility], [address] [place], [place] [address]      Radiology Studies: No results found. Scheduled Meds: . aspirin  324 mg Oral Daily  . docusate sodium  100 mg Oral BID  . enoxaparin (LOVENOX) injection  40 mg Subcutaneous Q24H  . hydrALAZINE  50 mg Oral Q8H   Continuous Infusions: . dextrose 5 % and 0.45 % NaCl with KCl 20 mEq/L 75 mL/hr at 04/03/20 0055     LOS: 6 days    Time spent: 25 mins.    Shawna Clamp, MD [facility]   If 7PM-7AM, please contact night-coverage

## 2020-04-03 NOTE — Plan of Care (Signed)
  Problem: Clinical Measurements: Goal: Will remain free from infection Outcome: Progressing   Problem: Elimination: Goal: Will not experience complications related to bowel motility Outcome: Progressing   

## 2020-04-04 DIAGNOSIS — R531 Weakness: Secondary | ICD-10-CM | POA: Diagnosis not present

## 2020-04-04 LAB — CREATININE, SERUM
Creatinine, Ser: 0.69 mg/dL (ref 0.61–1.24)
GFR, Estimated: >60 mL/min (ref >60)

## 2020-04-04 MED ORDER — LOSARTAN POTASSIUM 50 MG PO TABS: 50.0000 mg | ORAL_TABLET | Freq: Every day | ORAL | 1 refills | Status: AC

## 2020-04-04 MED ORDER — HYDRALAZINE HCL 50 MG PO TABS: 50.0000 mg | ORAL_TABLET | Freq: Three times a day (TID) | ORAL | 0 refills | Status: DC

## 2020-04-04 MED ORDER — LOSARTAN POTASSIUM 50 MG PO TABS
50.0000 mg | ORAL_TABLET | Freq: Every day | ORAL | Status: DC
  Administered 2020-04-04: 50 mg via ORAL
  Filled 2020-04-04: qty 1

## 2020-04-04 MED ORDER — NYSTATIN 100000 UNIT/GM EX POWD: 1.0000 "application " | Freq: Three times a day (TID) | CUTANEOUS | 0 refills | Status: DC

## 2020-04-04 NOTE — Discharge Summary (Addendum)
Physician Discharge Summary  Gerald [name] [id number] DOB: [birth year] DOA: 03/28/2020  PCP: [name], MD  Admit date: 03/28/2020   Discharge date: 04/04/2020  Admitted From:  Home.  Disposition:   Home with home services.  Recommendations for Outpatient Follow-up:  1. Follow up with PCP in 1-2 weeks. 2. Please obtain BMP/CBC in one week. 3.   Advised to follow-up outpatient Neurology as scheduled ,  appointment has been made. 4.   Advised to take hydralazine 50 mg 3 times daily and losartan 50 mg daily for hypertension. 5.   Advised to follow-up with Dr. Rogue Bussing as scheduled.  Home Health: Home health services, PT, OT,  RN Equipment/Devices: Harrel Lemon lift, PT OT RN.  Discharge Condition: Stable CODE STATUS:Full code Diet recommendation: Dysphagia 2 diet  Brief Summary/ Hospital course: This 87 yrs old male with PMH significant for HTN, myelodysplastic syndrome, moderate mitral regurgitation, chronic anemia, remote history of prostate or testicular cancer, paroxysmal A. fib not on anticoagulation due to risks of falls who presents with generalized weakness and altered mental status. History is obtained from daughter since the patient was confused.  Family reports patient had hip replaced little under a year ago and since then family noticed gradual decline.  Patient has been less mobile with less energy and cognitive decline.  He was recently treated for respiratory infection by his primary care physician with antibiotics 3 weeks ago.  Patient has worsening mental status after he completed antibiotics.  Patient was sent from oncologist office for altered mental status.  CT abdomen showed finding consistent with proctitis, questionable splenic infarct , Patient started on Zosyn. MRI shows acute stroke.  Patient was admitted for altered mental status possibly secondary to the stroke, Patient was seen by neurology recommended to continue aspirin 325 mg daily.  Patient was  initially started on Zosyn,  has taken antibiotic for 3 days, antibiotic discontinued.  Patient remains afebrile without leukocytosis.  Cardiology was consulted for elevated troponins and abnormal echocardiogram shows inferior hypokinesis.  Cardiology recommended No plan for invasive work-up or further cardiac imaging testing at this time.  Patient remains in normal sinus rhythm,  Patient has deferred anticoagulation in the past which is reasonable as he currently is normal sinus rhythm and has relatively high fall or bleed risk.  Patient is cleared from neurology and cardiology to be discharged.  PT recommended skilled nursing facility but family has decided for home with home services since they have family support at home.  He was managed for below problems.    Discharge Diagnoses:  Active Problems:   Anemia   A-fib (HCC)   Essential (primary) hypertension   Hemochromatosis   MDS (myelodysplastic syndrome), low grade (HCC)   Acute proctitis   Acute encephalopathy   Pressure injury of sacral region, stage 2 (HCC)   CVA (cerebral vascular accident) [facility])   Palliative care encounter  Acute encephalopathy could be secondary to acute stroke> improved Etiology unclear at admission. CT Head w/o acute findings. May be infection given mild leukocytosis but no localizing symptoms.  Recent URI but that appears mostly resolved.  CT Abdomen shows possible proctitis maybe 2/2 chronic constipation though report of stooling regularly as of late.  Urinalysis not suggestive of infection. CT does show some signs of fluid overload and breathing comfortably on room air.  Covid negative. Normal glucose, no significant electrolyte abnormalities. No signs hepatic dysfunction or uremia.  Not on meds associated with this. Could represent end of life condition in this very  elderly patient. Full code for now but daughter will discuss w/ other family members, She does think at this point patient would probably want  to be DNR MRI Brain: Small acute or early subacute infarct in the right upper pons. Mild associated edema without mass effect. MRSA nares negative, will continue zosyn for now, presume would treat acute proctatitis.  Blood cultures no growth so far, baseline procalcitonin normal.  Likely stop abx in next 24-48 hours. Lactic acid 1.5.  Zosyn discontinued 1/14. Speech/ swallow eval, recommended dysphagia 2 diet. Patient is back to his baseline mental status. Neurology cleared the patient patient can be discharged.  Elevated troponin: Interstitial edema seen on CT of chest: Patient does have history of moderate mitral regurg. Seen on CTA. No PE. ED gave lasix x1.  Patient appears euvolemic. Echocardiogram shows inferior hypokinesis, regional wall motion abnormalities EF 50 to 55%. Patient denies any chest pain, EKG no ST-T wave changes. Cardiology consulted, patient appears in normal sinus rhythm,  denies any chest pain,   No plan for invasive work-up or further cardiac imaging at this time.   Myelodysplastic syndrome Chronic anemia Mild per onc. Hb remains stable.   Hypokalemia : Resolved. Mild, 3.4, replacement in progress,  recheck labs in AM.  Atrial fibrillation, paroxysmal. Not anticoagualted due to fall risk and advanced age.  Hold home aspirin, benefit at this point doubtful No plan for invasive work-up or further cardiac imaging at this time. Patient remains in normal sinus rhythm.  High risk for bleeding. Defer anticoagulation  Stage 2 pressure ulcer - nursing wound care.  splenic infarct? possible splenic infarct seen on CTA. No report of abdominal pain.  - will monitor for now.  Proctitis? Does have hx constipation. Doubtful would be cause of encephalopathy if present DC antibiotics.  No need to consult GI.  Cystic lesion in kidney - outpt renal u/s, consider  HTN: Blood pressure remains elevated. Home medications resumed. Started hydralazine 50 mg  TID and losartan 50 mg daily  History constipation - cont home colace  Discharge Instructions  Discharge Instructions    Call MD for:  difficulty breathing, headache or visual disturbances   Complete by: As directed    Call MD for:  persistant dizziness or light-headedness   Complete by: As directed    Call MD for:  persistant nausea and vomiting   Complete by: As directed    Diet - low sodium heart healthy   Complete by: As directed    Diet Carb Modified   Complete by: As directed    Discharge instructions   Complete by: As directed    Advised to follow-up with primary care physician in 1 week.   Advised to follow-up outpatient with neurology as scheduled ,  appointment been made.   Discharge wound care:   Complete by: As directed    Follow-up primary care physician.  Home health services been arranged   Increase activity slowly   Complete by: As directed      Allergies as of 04/04/2020      Reactions   Levofloxacin Other (See Comments)   Taste alterations for about 1 month after taking med      Medication List    STOP taking these medications   HYDROcodone-acetaminophen 5-325 MG tablet Commonly known as: NORCO/VICODIN   naproxen sodium 220 MG tablet Commonly known as: ALEVE     TAKE these medications   acetaminophen 650 MG CR tablet Commonly known as: TYLENOL Take 650 mg by mouth  2 (two) times daily as needed for pain.   ADULT GUMMY PO Take 2 tablets by mouth daily.   aspirin EC 81 MG tablet Take 81 mg by mouth every evening.   CAL-MAG-ZINC PO Take 1 tablet by mouth daily.   docusate sodium 100 MG capsule Commonly known as: COLACE Take 100 mg by mouth 2 (two) times daily. With breakfast & with supper   ELDERBERRY PO Take 2 tablets by mouth daily. Gummy   hydrALAZINE 50 MG tablet Commonly known as: APRESOLINE Take 1 tablet (50 mg total) by mouth every 8 (eight) hours.   ibuprofen 200 MG tablet Commonly known as: ADVIL Take 200 mg by mouth 2  (two) times daily as needed (pain.).   losartan 50 MG tablet Commonly known as: COZAAR Take 1 tablet (50 mg total) by mouth daily. Start taking on: April 05, 2020   MEDI-PATCH-LIDOCAINE EX Apply 1 patch topically daily. Applied to back   nystatin powder Commonly known as: MYCOSTATIN/NYSTOP Apply 1 application topically 3 (three) times daily.   Potassium 99 MG Tabs Take 99 mg by mouth daily.   QC Vitamin D3 50 MCG (2000 UT) Tabs Generic drug: Cholecalciferol Take 2,000 Units by mouth daily.   Turmeric 500 MG Tabs Take 500 mg by mouth at bedtime. w/Ginger   vitamin B-12 1000 MCG tablet Commonly known as: CYANOCOBALAMIN Take 1,000 mcg by mouth daily.            Discharge Care Instructions  (From admission, onward)         Start     Ordered   04/04/20 0000  Discharge wound care:       Comments: Follow-up primary care physician.  Home health services been arranged   04/04/20 1044          Follow-up Information    [name], MD Follow up in 1 week(s).   Specialty: Family Medicine Contact information: [place] [address] [phone]        Kerney Elbe, MD Follow up in 2 week(s).   Specialty: Neurology       Cammie Sickle, MD Follow up in 1 week(s).   Specialties: Internal Medicine, Oncology Contact information: [place] [address] [phone]              Allergies  Allergen Reactions  . Levofloxacin Other (See Comments)    Taste alterations for about 1 month after taking med    Consultations:  Neurology, cardiology, heme oncology   Procedures/Studies: DG Chest 1 View  Result Date: 03/28/2020 CLINICAL DATA:  Altered mental status EXAM: CHEST  1 VIEW COMPARISON:  05/27/2019 FINDINGS: Heart is normal size. Diffuse interstitial prominence throughout the lungs, favor chronic lung disease. No effusions. Aortic atherosclerosis. No acute bony abnormality. IMPRESSION: Diffuse interstitial  prominence throughout the lungs, favor chronic interstitial lung disease. Alternatively, this could reflect viral/atypical infection. Electronically Signed   By: Rolm Baptise M.D.   On: 03/28/2020 00:23   CT Head Wo Contrast  Result Date: 03/28/2020 CLINICAL DATA:  Mental status changes EXAM: CT HEAD WITHOUT CONTRAST TECHNIQUE: Contiguous axial images were obtained from the base of the skull through the vertex without intravenous contrast. COMPARISON:  None. 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 Chronic bilateral basal ganglia lacunar infarcts. There is atrophy and chronic small vessel disease changes. No acute intracranial abnormality. Specifically, no hemorrhage, hydrocephalus, mass lesion, acute infarction, or significant intracranial injury. Vascular: No hyperdense vessel or unexpected calcification.  Skull: No acute calvarial abnormality. Sinuses/Orbits: Air-fluid level in the right maxillary sinus. 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 Atrophy, chronic microvascular disease. No acute intracranial abnormality. Electronically Signed   By: Rolm Baptise M.D.   On: 03/28/2020 00:21   CT Angio Chest PE W and/or Wo Contrast  Result Date: 03/28/2020 CLINICAL DATA:  Lethargy, possible pulmonary embolism, abdominal pain EXAM: CT ANGIOGRAPHY CHEST CT ABDOMEN AND PELVIS WITH CONTRAST TECHNIQUE: Multidetector CT imaging of the chest was performed using the standard protocol during bolus administration of intravenous contrast. Multiplanar CT image reconstructions and MIPs were obtained to evaluate the vascular anatomy. Multidetector CT imaging of the abdomen and pelvis was performed using the standard protocol during bolus administration of intravenous contrast. CONTRAST:  155mL OMNIPAQUE IOHEXOL 350 MG/ML SOLN COMPARISON:  MR lumbar spine 12/20/2019, fluoroscopy lumbar spine 12/23/2019, 12/14/2015, CT abdomen pelvis 12/28/1998  (report only) FINDINGS: CTA CHEST FINDINGS Cardiovascular: Satisfactory opacification of pulmonary arteries. No large central or lobar pulmonary arterial filling defects are identified. Respiratory motion may limit detection of smaller, subsegmental pulmonary emboli. Borderline dilatation of the the pulmonary trunk. Cardiac size is top normal. Three-vessel coronary artery atherosclerosis is noted. Trace pericardial fluid including fluid within the pericardial recesses. Ascending thoracic aortic dilatation to 4.3 cm. Atherosclerotic plaque throughout the thoracic aorta. No acute luminal abnormality. No focal periaortic stranding or hemorrhage. Cerebral region of the brachiocephalic and left common carotid arteries. The left vertebral artery arises directly from the aortic arch between this shared origin and the left subclavian artery. Proximal great vessels are otherwise unremarkable. Mediastinum/Nodes: Fluid in the pericardial recesses. No mediastinal gas. Normal thyroid gland and thoracic inlet. No acute abnormality of the esophagus. Secretions noted in the trachea, posterior bowing likely related to imaging during exhalation. No worrisome mediastinal, hilar or axillary adenopathy. Shotty low-attenuation subcentimeter mediastinal and hilar lymph nodes are favored to be edematous or reactive. Lungs/Pleura: Diffuse airways thickening and scattered secretions. Diffuse interlobular septal thickening, pulmonary vascular redistribution and hazy ground-glass opacity particularly within the more dependent portions of the lungs are findings most compatible with interstitial and early alveolar edema. Small to moderate bilateral pleural effusions are present, right slightly greater than left. Adjacent passive atelectatic changes. No pneumothorax. Musculoskeletal: T12 compression deformity with vertebroplasty changes, with 20% residual height loss not significantly changed from fluoroscopic images at the time of procedure  12/23/2019. No new acute osseous abnormalities or suspicious lytic or blastic lesions are seen. No worrisome chest wall masses or lesions. Review of the MIP images confirms the above findings. CT ABDOMEN and PELVIS FINDINGS Hepatobiliary: Few punctate parenchymal calcifications within the liver, largest in segment 4) 5/21) possibly vascular versus prior granulomatous disease. No worrisome focal liver lesions. Smooth liver surface contour. Normal hepatic attenuation. Prior cholecystectomy. Marked distension of the intra and extrahepatic biliary tree with the common bile duct measuring up to 15 mm in diameter, greater than expected for typical post cholecystectomy reservoir effect and senescent change. No visible intraductal gallstones. Pancreas: No pancreatic ductal dilatation or surrounding inflammatory changes. Spleen: Heterogeneous enhancement the spleen is nonspecific and possibly related to arterial phase of contrast imaging though a slightly more wedge-shaped area of hypoattenuation is seen in the posterior spleen (5/20) which could reflect an early splenic infarct. Adrenals/Urinary Tract: No concerning adrenal nodules or masses. There is a hyperdense cystic appearing lesion measuring up to 2.8 cm in the lower pole left kidney though demonstrate some conspicuous increase in attenuation of approximately 15 Hounsfield  units on the delayed phase imaging (5/38, 10/20) more simple appearing cysts are seen elsewhere in both kidneys with a slightly larger minimally complex cyst with thin internal septations arising from the upper pole right kidney measuring up to 2 cm in size (5/38) no other concerning renal lesions. Kidneys enhance and excrete symmetrically. No urolithiasis or hydronephrosis. Excreted contrast material is noted within the collecting system from separate contrast bolus administration. Urinary bladder is otherwise unremarkable. Stomach/Bowel: Sliding-type hiatal hernia. Stomach and duodenum are  unremarkable. No small bowel thickening or dilatation. The appendix is surgically absent. No colonic dilatation or wall thickening. Scattered colonic diverticula without focal inflammation to suggest diverticulitis. Some mild segmental thickening of the sigmoid in a region of numerous colonic diverticula without acute pericolonic or focal diverticular inflammation could reflect sequela of prior inflammation. Mild circumferential anorectal thickening with perirectal fat stranding is nonspecific though could correlate for features of proctitis. Vascular/Lymphatic: Atherosclerotic calcifications within the abdominal aorta and branch vessels. No aneurysm or ectasia. No enlarged abdominopelvic lymph nodes. Reproductive: Prostate appears surgically absent. Other: Perirectal hazy stranding, as above. Postsurgical changes from prior low vertical midline incision. Mild body wall edema. No abdominopelvic free air or fluid. Musculoskeletal: Remote compression deformity L2 with evidence of prior vertebroplasty in slight extravasation of cement into the L3-4 disc space. Up to 70% residual height loss centrally. Stepwise likely degenerative retrolisthesis L2-L5. Additional transitional lumbosacral anatomy, with what is likely a rudimentary disc between the S1 and S2 levels. The osseous structures appear diffusely demineralized which may limit detection of small or nondisplaced fractures. No acute osseous abnormality or suspicious osseous lesion. Prior left hip arthroplasty with streak artifact. Hardware is in expected alignment without complication. Review of the MIP images confirms the above findings. IMPRESSION: 1. No evidence of large central or lobar pulmonary arterial filling defects to suggest pulmonary embolism. Respiratory motion may limit detection of smaller, subsegmental pulmonary emboli. 2. Features of congestive heart failure with interstitial and early alveolar edema, small to moderate bilateral pleural effusions.  3. Ascending thoracic aortic dilatation to 4.3 cm. No evidence of dissection. Guidelines recommend annual imaging followup by CTA or MRA. This recommendation follows 2010 ACCF/AHA/AATS/ACR/ASA/SCA/SCAI/SIR/STS/SVM Guidelines for the Diagnosis and Management of Patients with Thoracic Aortic Disease. Circulation. 2010; [id number]. Aortic aneurysm NOS (ICD10-I71.9) 4. Heterogeneous enhancement the spleen is nonspecific and possibly related to arterial phase of contrast imaging though a slightly more wedge-shaped area of hypoattenuation is seen in the posterior spleen could reflect an early splenic infarct. 5. Mild circumferential anorectal thickening with perirectal fat stranding is nonspecific though could correlate for features of proctitis, and consider direct visualization. 6. Segmental thickening of the mid sigmoid colon in a region of numerous colonic diverticula the albeit without acute inflammation at this time, may reflect sequela of prior diverticular inflammation. 7. Hyperdense cystic appearing lesion measuring up to 2.8 cm in the lower pole left kidney though demonstrate some conspicuous increase in attenuation of approximately 15 Hounsfield units on the delayed phase imaging. Recommend further evaluation with nonemergent renal ultrasound for further evaluation. 8. Sliding-type hiatal hernia. 9. Compression deformities and vertebroplasty changes T12 and L2. Electronically Signed   By: Lovena Le M.D.   On: 03/28/2020 05:03   MR BRAIN WO CONTRAST  Result Date: 03/28/2020 CLINICAL DATA:  Mental status change, unknown cause. EXAM: MRI HEAD WITHOUT CONTRAST TECHNIQUE: Multiplanar, multiecho pulse sequences of the brain and surrounding structures were obtained without intravenous contrast. COMPARISON:  CT head 03/28/2020 FINDINGS: Brain: Small acute or early  subacute infarct in the right upper pons (see series 5 and 6, image 18). Mild associated edema without mass effect. Remote right frontal lobe  infarct with encephalomalacia and surrounding gliosis. Multiple dilated perivascular spaces versus lacunar infarcts in bilateral basal ganglia. Additional scattered T2/FLAIR hyperintensity within the white matter, compatible with chronic microvascular ischemic disease. Moderate diffuse cerebral volume loss with ex vacuo ventricular dilation. No acute hemorrhage. No hydrocephalus. No midline shift. No extra-axial fluid collection. No mass lesion. Vascular: Abnormal flow related signal within the visualized right vertebral artery, concerning for stenosis or occlusion. Skull and upper cervical spine: Diffuse T1 hypointensity of the marrow. Sinuses/Orbits: Inferior right maxillary sinus mucosal thickening. Unremarkable orbits. Other: Small right mastoid effusion. IMPRESSION: 1. Small acute or early subacute infarct in the right upper pons. Mild associated edema without mass effect. 2. Abnormal flow related signal within the visualized right vertebral artery, concerning for occlusion or significant stenosis. Recommend CTA to further evaluate. 3. Remote right frontal lobe infarct and dilated perivascular spaces versus remote lacunar infarcts in bilateral basal ganglia. 4. Moderate chronic microvascular ischemic disease and generalized cerebral volume loss. Diffuse 5. Diffuse T1 hypointensity of the marrow, which is nonspecific and could relate to chronic hypoxia (such as in smokers), chronic anemia, or lymphoproliferative disorder. Conclusions #1 and #2 were discussed with Dr. Jaclynn Guarneri Via telephone at 12:25 p.m. Electronically Signed   By: Margaretha Sheffield MD   On: 03/28/2020 12:31   CT ABDOMEN PELVIS W CONTRAST  Result Date: 03/28/2020 CLINICAL DATA:  Lethargy, possible pulmonary embolism, abdominal pain EXAM: CT ANGIOGRAPHY CHEST CT ABDOMEN AND PELVIS WITH CONTRAST TECHNIQUE: Multidetector CT imaging of the chest was performed using the standard protocol during bolus administration of intravenous contrast.  Multiplanar CT image reconstructions and MIPs were obtained to evaluate the vascular anatomy. Multidetector CT imaging of the abdomen and pelvis was performed using the standard protocol during bolus administration of intravenous contrast. CONTRAST:  164mL OMNIPAQUE IOHEXOL 350 MG/ML SOLN COMPARISON:  MR lumbar spine 12/20/2019, fluoroscopy lumbar spine 12/23/2019, 12/14/2015, CT abdomen pelvis 12/28/1998 (report only) FINDINGS: CTA CHEST FINDINGS Cardiovascular: Satisfactory opacification of pulmonary arteries. No large central or lobar pulmonary arterial filling defects are identified. Respiratory motion may limit detection of smaller, subsegmental pulmonary emboli. Borderline dilatation of the the pulmonary trunk. Cardiac size is top normal. Three-vessel coronary artery atherosclerosis is noted. Trace pericardial fluid including fluid within the pericardial recesses. Ascending thoracic aortic dilatation to 4.3 cm. Atherosclerotic plaque throughout the thoracic aorta. No acute luminal abnormality. No focal periaortic stranding or hemorrhage. Cerebral region of the brachiocephalic and left common carotid arteries. The left vertebral artery arises directly from the aortic arch between this shared origin and the left subclavian artery. Proximal great vessels are otherwise unremarkable. Mediastinum/Nodes: Fluid in the pericardial recesses. No mediastinal gas. Normal thyroid gland and thoracic inlet. No acute abnormality of the esophagus. Secretions noted in the trachea, posterior bowing likely related to imaging during exhalation. No worrisome mediastinal, hilar or axillary adenopathy. Shotty low-attenuation subcentimeter mediastinal and hilar lymph nodes are favored to be edematous or reactive. Lungs/Pleura: Diffuse airways thickening and scattered secretions. Diffuse interlobular septal thickening, pulmonary vascular redistribution and hazy ground-glass opacity particularly within the more dependent portions of the  lungs are findings most compatible with interstitial and early alveolar edema. Small to moderate bilateral pleural effusions are present, right slightly greater than left. Adjacent passive atelectatic changes. No pneumothorax. Musculoskeletal: T12 compression deformity with vertebroplasty changes, with 20% residual height loss not significantly changed from fluoroscopic  images at the time of procedure 12/23/2019. No new acute osseous abnormalities or suspicious lytic or blastic lesions are seen. No worrisome chest wall masses or lesions. Review of the MIP images confirms the above findings. CT ABDOMEN and PELVIS FINDINGS Hepatobiliary: Few punctate parenchymal calcifications within the liver, largest in segment 4) 5/21) possibly vascular versus prior granulomatous disease. No worrisome focal liver lesions. Smooth liver surface contour. Normal hepatic attenuation. Prior cholecystectomy. Marked distension of the intra and extrahepatic biliary tree with the common bile duct measuring up to 15 mm in diameter, greater than expected for typical post cholecystectomy reservoir effect and senescent change. No visible intraductal gallstones. Pancreas: No pancreatic ductal dilatation or surrounding inflammatory changes. Spleen: Heterogeneous enhancement the spleen is nonspecific and possibly related to arterial phase of contrast imaging though a slightly more wedge-shaped area of hypoattenuation is seen in the posterior spleen (5/20) which could reflect an early splenic infarct. Adrenals/Urinary Tract: No concerning adrenal nodules or masses. There is a hyperdense cystic appearing lesion measuring up to 2.8 cm in the lower pole left kidney though demonstrate some conspicuous increase in attenuation of approximately 15 Hounsfield units on the delayed phase imaging (5/38, 10/20) more simple appearing cysts are seen elsewhere in both kidneys with a slightly larger minimally complex cyst with thin internal septations arising from  the upper pole right kidney measuring up to 2 cm in size (5/38) no other concerning renal lesions. Kidneys enhance and excrete symmetrically. No urolithiasis or hydronephrosis. Excreted contrast material is noted within the collecting system from separate contrast bolus administration. Urinary bladder is otherwise unremarkable. Stomach/Bowel: Sliding-type hiatal hernia. Stomach and duodenum are unremarkable. No small bowel thickening or dilatation. The appendix is surgically absent. No colonic dilatation or wall thickening. Scattered colonic diverticula without focal inflammation to suggest diverticulitis. Some mild segmental thickening of the sigmoid in a region of numerous colonic diverticula without acute pericolonic or focal diverticular inflammation could reflect sequela of prior inflammation. Mild circumferential anorectal thickening with perirectal fat stranding is nonspecific though could correlate for features of proctitis. Vascular/Lymphatic: Atherosclerotic calcifications within the abdominal aorta and branch vessels. No aneurysm or ectasia. No enlarged abdominopelvic lymph nodes. Reproductive: Prostate appears surgically absent. Other: Perirectal hazy stranding, as above. Postsurgical changes from prior low vertical midline incision. Mild body wall edema. No abdominopelvic free air or fluid. Musculoskeletal: Remote compression deformity L2 with evidence of prior vertebroplasty in slight extravasation of cement into the L3-4 disc space. Up to 70% residual height loss centrally. Stepwise likely degenerative retrolisthesis L2-L5. Additional transitional lumbosacral anatomy, with what is likely a rudimentary disc between the S1 and S2 levels. The osseous structures appear diffusely demineralized which may limit detection of small or nondisplaced fractures. No acute osseous abnormality or suspicious osseous lesion. Prior left hip arthroplasty with streak artifact. Hardware is in expected alignment without  complication. Review of the MIP images confirms the above findings. IMPRESSION: 1. No evidence of large central or lobar pulmonary arterial filling defects to suggest pulmonary embolism. Respiratory motion may limit detection of smaller, subsegmental pulmonary emboli. 2. Features of congestive heart failure with interstitial and early alveolar edema, small to moderate bilateral pleural effusions. 3. Ascending thoracic aortic dilatation to 4.3 cm. No evidence of dissection. Guidelines recommend annual imaging followup by CTA or MRA. This recommendation follows 2010 ACCF/AHA/AATS/ACR/ASA/SCA/SCAI/SIR/STS/SVM Guidelines for the Diagnosis and Management of Patients with Thoracic Aortic Disease. Circulation. 2010; 121: N027-O536. Aortic aneurysm NOS (ICD10-I71.9) 4. Heterogeneous enhancement the spleen is nonspecific and possibly related to arterial phase  of contrast imaging though a slightly more wedge-shaped area of hypoattenuation is seen in the posterior spleen could reflect an early splenic infarct. 5. Mild circumferential anorectal thickening with perirectal fat stranding is nonspecific though could correlate for features of proctitis, and consider direct visualization. 6. Segmental thickening of the mid sigmoid colon in a region of numerous colonic diverticula the albeit without acute inflammation at this time, may reflect sequela of prior diverticular inflammation. 7. Hyperdense cystic appearing lesion measuring up to 2.8 cm in the lower pole left kidney though demonstrate some conspicuous increase in attenuation of approximately 15 Hounsfield units on the delayed phase imaging. Recommend further evaluation with nonemergent renal ultrasound for further evaluation. 8. Sliding-type hiatal hernia. 9. Compression deformities and vertebroplasty changes T12 and L2. Electronically Signed   By: Kreg ShropshirePrice  DeHay M.D.   On: 03/28/2020 05:03   ECHOCARDIOGRAM COMPLETE  Result Date: 03/28/2020    ECHOCARDIOGRAM REPORT    Patient Name:   Gerald [name] Date of Exam: 03/28/2020 Medical Rec #:  [id number]           Height:       69.0 in Accession #:    [id number]          Weight:       138.9 lb Date of Birth:  02/27/1921          BSA:          1.769 m Patient Age:    87 years            BP:           119/81 mmHg Patient Gender: M                   HR:           119 bpm. Exam Location:  [facility] Procedure: 2D Echo, Color Doppler and Cardiac Doppler Indications:     CHF-acute diastolic I50.31  History:         Patient has no prior history of Echocardiogram examinations.                  Arrythmias:Atrial Fibrillation; Risk Factors:Hypertension.  Sonographer:     Cristela BlueJerry Hege RDCS (AE) Referring Phys:  [id number] Wilfred CurtisNOAH BEDFORD ZHYQWOUK Diagnosing Phys: Alwyn Peawayne D Callwood MD IMPRESSIONS  1. Inferior hypokinesis.  2. Left ventricular ejection fraction, by estimation, is 50 to 55%. The left ventricle has low normal function. The left ventricle demonstrates regional wall motion abnormalities (see scoring diagram/findings for description). Left ventricular diastolic  parameters were normal.  3. Right ventricular systolic function is normal. The right ventricular size is normal.  4. The mitral valve is normal in structure. Mild mitral valve regurgitation.  5. The aortic valve is normal in structure. Aortic valve regurgitation is trivial. FINDINGS  Left Ventricle: Left ventricular ejection fraction, by estimation, is 50 to 55%. The left ventricle has low normal function. The left ventricle demonstrates regional wall motion abnormalities. The left ventricular internal cavity size was normal in size. There is no left ventricular hypertrophy. Left ventricular diastolic parameters were normal. Right Ventricle: The right ventricular size is normal. No increase in right ventricular wall thickness. Right ventricular systolic function is normal. Left Atrium: Left atrial size was normal in size. Right Atrium: Right atrial size was normal in size. Pericardium: There  is no evidence of pericardial effusion. Mitral Valve: The mitral valve is normal in structure. Mild mitral valve regurgitation. Tricuspid Valve: The tricuspid valve is normal in structure. Tricuspid  valve regurgitation is mild. Aortic Valve: The aortic valve is normal in structure. Aortic valve regurgitation is trivial. Aortic valve mean gradient measures 2.0 mmHg. Aortic valve peak gradient measures 3.1 mmHg. Aortic valve area, by VTI measures 3.96 cm. Pulmonic Valve: The pulmonic valve was grossly normal. Pulmonic valve regurgitation is not visualized. Aorta: The ascending aorta was not well visualized. IAS/Shunts: No atrial level shunt detected by color flow Doppler. Additional Comments: Inferior hypokinesis.  LEFT VENTRICLE PLAX 2D LVIDd:         3.90 cm LVIDs:         2.80 cm LV PW:         1.20 cm LV IVS:        1.30 cm LVOT diam:     2.30 cm LV SV:         40 LV SV Index:   23 LVOT Area:     4.15 cm  RIGHT VENTRICLE RV Basal diam:  2.40 cm RV S prime:     17.40 cm/s TAPSE (M-mode): 3.0 cm LEFT ATRIUM             Index       RIGHT ATRIUM          Index LA diam:        4.40 cm 2.49 cm/m  RA Area:     7.20 cm LA Vol (A2C):   39.5 ml 22.32 ml/m RA Volume:   12.30 ml 6.95 ml/m LA Vol (A4C):   17.1 ml 9.66 ml/m LA Biplane Vol: 27.6 ml 15.60 ml/m  AORTIC VALVE                   PULMONIC VALVE AV Area (Vmax):    2.70 cm    PV Vmax:        0.38 m/s AV Area (Vmean):   3.02 cm    PV Peak grad:   0.6 mmHg AV Area (VTI):     3.96 cm    RVOT Peak grad: 2 mmHg AV Vmax:           87.50 cm/s AV Vmean:          55.800 cm/s AV VTI:            0.101 m AV Peak Grad:      3.1 mmHg AV Mean Grad:      2.0 mmHg LVOT Vmax:         56.80 cm/s LVOT Vmean:        40.600 cm/s LVOT VTI:          0.096 m LVOT/AV VTI ratio: 0.95  AORTA Ao Root diam: 3.77 cm MITRAL VALVE               TRICUSPID VALVE MV Area (PHT): 3.84 cm    TR Peak grad:   8.6 mmHg MV Decel Time: 197 msec    TR Vmax:        147.00 cm/s MV E velocity: 77.43 cm/s                             SHUNTS                            Systemic VTI:  0.10 m  Systemic Diam: 2.30 cm Yolonda Kida MD Electronically signed by Yolonda Kida MD Signature Date/Time: 03/28/2020/4:43:57 PM    Final     CT head, echocardiogram, MRI brain   Subjective: Patient was seen and examined at bedside.  Overnight events noted.  Patient reports feeling much better,  daughter is at bedside. Patient is cleared to be discharged.  Home health services been arranged.  Patient will follow-up outpatient neurology and hematology.  Discharge Exam: Vitals:   04/04/20 0557 04/04/20 0909  BP: (!) 180/71 (!) 146/84  Pulse:  77  Resp:  18  Temp:  97.9 F (36.6 C)  SpO2:  100%   Vitals:   04/04/20 0052 04/04/20 0355 04/04/20 0557 04/04/20 0909  BP:  (!) 154/70 (!) 180/71 (!) 146/84  Pulse:  81  77  Resp:  17  18  Temp:  97.8 F (36.6 C)  97.9 F (36.6 C)  TempSrc:  Oral  Oral  SpO2:  100%  100%  Weight: 67.1 kg     Height:        General: Pt is alert, awake, not in acute distress Cardiovascular: RRR, S1/S2 +, no rubs, no gallops Respiratory: CTA bilaterally, no wheezing, no rhonchi Abdominal: Soft, NT, ND, bowel sounds + Extremities: no edema, no cyanosis    The results of significant diagnostics from this hospitalization (including imaging, microbiology, ancillary and laboratory) are listed below for reference.     Microbiology: Recent Results (from the past 240 hour(s))  Resp Panel by RT-PCR (Flu A&B, Covid) Nasopharyngeal Swab     Status: None   Collection Time: 03/28/20  3:06 AM   Specimen: Nasopharyngeal Swab; Nasopharyngeal(NP) swabs in vial transport medium  Result Value Ref Range Status   SARS Coronavirus 2 by RT PCR NEGATIVE NEGATIVE Final    Comment: (NOTE) SARS-CoV-2 target nucleic acids are NOT DETECTED.  The SARS-CoV-2 RNA is generally detectable in upper respiratory specimens during the acute phase of infection. The  lowest concentration of SARS-CoV-2 viral copies this assay can detect is 138 copies/mL. A negative result does not preclude SARS-Cov-2 infection and should not be used as the sole basis for treatment or other patient management decisions. A negative result may occur with  improper specimen collection/handling, submission of specimen other than nasopharyngeal swab, presence of viral mutation(s) within the areas targeted by this assay, and inadequate number of viral copies(<138 copies/mL). A negative result must be combined with clinical observations, patient history, and epidemiological information. The expected result is Negative.  Fact Sheet for Patients:  EntrepreneurPulse.com.au  Fact Sheet for Healthcare Providers:  IncredibleEmployment.be  This test is no t yet approved or cleared by the [place] FDA and  has been authorized for detection and/or diagnosis of SARS-CoV-2 by FDA under an Emergency Use Authorization (EUA). This EUA will remain  in effect (meaning this test can be used) for the duration of the COVID-19 declaration under Section 564(b)(1) of the Act, 21 U.S.C.section 360bbb-3(b)(1), unless the authorization is terminated  or revoked sooner.       Influenza A by PCR NEGATIVE NEGATIVE Final   Influenza B by PCR NEGATIVE NEGATIVE Final    Comment: (NOTE) The Xpert Xpress SARS-CoV-2/FLU/RSV plus assay is intended as an aid in the diagnosis of influenza from Nasopharyngeal swab specimens and should not be used as a sole basis for treatment. Nasal washings and aspirates are unacceptable for Xpert Xpress SARS-CoV-2/FLU/RSV testing.  Fact Sheet for Patients: EntrepreneurPulse.com.au  Fact Sheet for Healthcare Providers: IncredibleEmployment.be  This test is not yet approved or cleared by the [place] and has been authorized for detection and/or diagnosis of SARS-CoV-2 by FDA under  an Emergency Use Authorization (EUA). This EUA will remain in effect (meaning this test can be used) for the duration of the COVID-19 declaration under Section 564(b)(1) of the Act, 21 U.S.C. section 360bbb-3(b)(1), unless the authorization is terminated or revoked.  Performed at [facility] [address] [place], [place] [address]   CULTURE, BLOOD (ROUTINE X 2) w Reflex to ID Panel     Status: None   Collection Time: 03/28/20  9:14 AM   Specimen: BLOOD  Result Value Ref Range Status   Specimen Description BLOOD RIGHT HAND  Final   Special Requests   Final    BOTTLES DRAWN AEROBIC AND ANAEROBIC Blood Culture results may not be optimal due to an inadequate volume of blood received in culture bottles   Culture   Final    NO GROWTH 5 DAYS Performed at [facility], [address] [place], [place] [address]    Report Status 04/02/2020 FINAL  Final  CULTURE, BLOOD (ROUTINE X 2) w Reflex to ID Panel     Status: None   Collection Time: 03/28/20  9:14 AM   Specimen: BLOOD  Result Value Ref Range Status   Specimen Description BLOOD RIGHT [facility]  Final   Special Requests   Final    BOTTLES DRAWN AEROBIC AND ANAEROBIC Blood Culture adequate volume   Culture   Final    NO GROWTH 5 DAYS Performed at [facility], [address] [place], [place] [address]    Report Status 04/02/2020 FINAL  Final  MRSA PCR Screening     Status: None   Collection Time: 03/29/20  6:17 AM   Specimen: Nasopharyngeal  Result Value Ref Range Status   MRSA by PCR NEGATIVE NEGATIVE Final    Comment:        The GeneXpert MRSA Assay (FDA approved for NASAL specimens only), is one component of a comprehensive MRSA colonization surveillance program. It is not intended to diagnose MRSA infection nor to guide or monitor treatment for MRSA infections. Performed at [facility], [address] [place], [place] [address]      Labs: BNP (last 3 results) Recent Labs     03/28/20 0914  BNP [id number]*   Basic Metabolic Panel: Recent Labs  Lab 03/29/20 0609 03/30/20 0505 03/31/20 0720 04/03/20 0324 04/04/20 0549  NA 138 142 142 139  --   K 3.5 3.4* 3.6 3.7  --   CL 102 106 108 109  --   CO2 26 25 25 23   --   GLUCOSE 120* 105* 100* 104*  --   BUN 23 22 21  26*  --   CREATININE 0.98 0.91 0.79 0.75 0.69  CALCIUM 8.9 9.0 8.9 8.8*  --   MG  --  1.8 1.8 1.8  --   PHOS  --  3.8 3.2 3.4  --    Liver Function Tests: Recent Labs  Lab 03/30/20 0505  AST 16  ALT 11  ALKPHOS 54  BILITOT 0.9  PROT 6.0*  ALBUMIN 3.2*   No results for input(s): LIPASE, AMYLASE in the last 168 hours. No results for input(s): AMMONIA in the last 168 hours. CBC: Recent Labs  Lab 03/29/20 0609 03/30/20 0505 03/31/20 0720 04/03/20 0324  WBC 9.3 8.1 9.0 8.3  HGB 9.4* 9.3* 9.2* 9.1*  HCT 28.9* 28.6* 28.1*  28.0*  MCV 93.8 93.5 93.7 94.0  PLT 383 408* 391 378   Cardiac Enzymes: No results for input(s): CKTOTAL, CKMB, CKMBINDEX, TROPONINI in the last 168 hours. BNP: Invalid input(s): POCBNP CBG: No results for input(s): GLUCAP in the last 168 hours. D-Dimer No results for input(s): DDIMER in the last 72 hours. Hgb A1c No results for input(s): HGBA1C in the last 72 hours. Lipid Profile No results for input(s): CHOL, HDL, LDLCALC, TRIG, CHOLHDL, LDLDIRECT in the last 72 hours. Thyroid function studies No results for input(s): TSH, T4TOTAL, T3FREE, THYROIDAB in the last 72 hours.  Invalid input(s): FREET3 Anemia work up No results for input(s): VITAMINB12, FOLATE, FERRITIN, TIBC, IRON, RETICCTPCT in the last 72 hours. Urinalysis    Component Value Date/Time   COLORURINE YELLOW (A) 03/28/2020 0306   APPEARANCEUR CLEAR (A) 03/28/2020 0306   APPEARANCEUR Clear 05/05/2013 1500   LABSPEC 1.012 03/28/2020 0306   LABSPEC 1.014 05/05/2013 1500   PHURINE 6.0 03/28/2020 0306   GLUCOSEU NEGATIVE 03/28/2020 0306   GLUCOSEU Negative 05/05/2013 1500   HGBUR NEGATIVE  03/28/2020 0306   BILIRUBINUR NEGATIVE 03/28/2020 0306   BILIRUBINUR Negative 05/05/2013 1500   KETONESUR NEGATIVE 03/28/2020 0306   PROTEINUR NEGATIVE 03/28/2020 0306   NITRITE NEGATIVE 03/28/2020 0306   LEUKOCYTESUR NEGATIVE 03/28/2020 0306   LEUKOCYTESUR Negative 05/05/2013 1500   Sepsis Labs Invalid input(s): PROCALCITONIN,  WBC,  LACTICIDVEN Microbiology Recent Results (from the past 240 hour(s))  Resp Panel by RT-PCR (Flu A&B, Covid) Nasopharyngeal Swab     Status: None   Collection Time: 03/28/20  3:06 AM   Specimen: Nasopharyngeal Swab; Nasopharyngeal(NP) swabs in vial transport medium  Result Value Ref Range Status   SARS Coronavirus 2 by RT PCR NEGATIVE NEGATIVE Final    Comment: (NOTE) SARS-CoV-2 target nucleic acids are NOT DETECTED.  The SARS-CoV-2 RNA is generally detectable in upper respiratory specimens during the acute phase of infection. The lowest concentration of SARS-CoV-2 viral copies this assay can detect is 138 copies/mL. A negative result does not preclude SARS-Cov-2 infection and should not be used as the sole basis for treatment or other patient management decisions. A negative result may occur with  improper specimen collection/handling, submission of specimen other than nasopharyngeal swab, presence of viral mutation(s) within the areas targeted by this assay, and inadequate number of viral copies(<138 copies/mL). A negative result must be combined with clinical observations, patient history, and epidemiological information. The expected result is Negative.  Fact Sheet for Patients:  EntrepreneurPulse.com.au  Fact Sheet for Healthcare Providers:  IncredibleEmployment.be  This test is no t yet approved or cleared by the [place] FDA and  has been authorized for detection and/or diagnosis of SARS-CoV-2 by FDA under an Emergency Use Authorization (EUA). This EUA will remain  in effect (meaning this test can  be used) for the duration of the COVID-19 declaration under Section 564(b)(1) of the Act, 21 U.S.C.section 360bbb-3(b)(1), unless the authorization is terminated  or revoked sooner.       Influenza A by PCR NEGATIVE NEGATIVE Final   Influenza B by PCR NEGATIVE NEGATIVE Final    Comment: (NOTE) The Xpert Xpress SARS-CoV-2/FLU/RSV plus assay is intended as an aid in the diagnosis of influenza from Nasopharyngeal swab specimens and should not be used as a sole basis for treatment. Nasal washings and aspirates are unacceptable for Xpert Xpress SARS-CoV-2/FLU/RSV testing.  Fact Sheet for Patients: EntrepreneurPulse.com.au  Fact Sheet for Healthcare Providers: IncredibleEmployment.be  This test is not yet approved or cleared  by the [place] and has been authorized for detection and/or diagnosis of SARS-CoV-2 by FDA under an Emergency Use Authorization (EUA). This EUA will remain in effect (meaning this test can be used) for the duration of the COVID-19 declaration under Section 564(b)(1) of the Act, 21 U.S.C. section 360bbb-3(b)(1), unless the authorization is terminated or revoked.  Performed at [facility], [address] [place], [place] [address]   CULTURE, BLOOD (ROUTINE X 2) w Reflex to ID Panel     Status: None   Collection Time: 03/28/20  9:14 AM   Specimen: BLOOD  Result Value Ref Range Status   Specimen Description BLOOD RIGHT HAND  Final   Special Requests   Final    BOTTLES DRAWN AEROBIC AND ANAEROBIC Blood Culture results may not be optimal due to an inadequate volume of blood received in culture bottles   Culture   Final    NO GROWTH 5 DAYS Performed at [facility], [address] [place], [place] [address]    Report Status 04/02/2020 FINAL  Final  CULTURE, BLOOD (ROUTINE X 2) w Reflex to ID Panel     Status: None   Collection Time: 03/28/20  9:14 AM   Specimen: BLOOD  Result Value Ref Range  Status   Specimen Description BLOOD RIGHT [facility]  Final   Special Requests   Final    BOTTLES DRAWN AEROBIC AND ANAEROBIC Blood Culture adequate volume   Culture   Final    NO GROWTH 5 DAYS Performed at [facility], [address] [place], [place] [address]    Report Status 04/02/2020 FINAL  Final  MRSA PCR Screening     Status: None   Collection Time: 03/29/20  6:17 AM   Specimen: Nasopharyngeal  Result Value Ref Range Status   MRSA by PCR NEGATIVE NEGATIVE Final    Comment:        The GeneXpert MRSA Assay (FDA approved for NASAL specimens only), is one component of a comprehensive MRSA colonization surveillance program. It is not intended to diagnose MRSA infection nor to guide or monitor treatment for MRSA infections. Performed at [facility], [address] [place], [place] [address]      Time coordinating discharge: Over 30 minutes  SIGNED:   Shawna Clamp, MD  [facility] 04/04/2020, 10:50 AM Pager   If 7PM-7AM, please contact night-coverage [email or web address]

## 2020-04-04 NOTE — TOC Transition Note (Signed)
Transition of Care [facility]) - CM/SW Discharge Note   Patient Details  Name: Gerald [name] MRN: [id number] Date of Birth: December 05, 1920  Transition of Care [facility] CM/SW Contact:  Kerin Salen, RN Phone Number: 04/04/2020, 11:04 AM    Final next level of care: [place] Barriers to Discharge: Barriers Resolved   Patient Goals and CMS Choice Patient states their goals for this hospitalization and ongoing recovery are:: To return home.   Choice offered to / list presented to : NA  Discharge Placement                Patient to be transferred to facility by:  (Will need EMS transfer to HOME) Name of family member notified: Daughter at bedside. Patient and family notified of of transfer: 04/04/20  Discharge Plan and Services                DME Arranged: Oxygen [employer] [employer] lift.) DME Agency: [employer] Date DME Agency Contacted: 04/04/20 Time DME Agency Contacted: [phone] Representative spoke with at DME Agency: [place]: PT,OT,RN [place]: [facility] [employer] Date [place]: 04/04/20 Time [place]: [place] Representative spoke with at [place]: [place] ([place]) Interventions     Readmission Risk Interventions No flowsheet data found.

## 2020-04-04 NOTE — Progress Notes (Signed)
Patient discharge home  To family with home care, all discharged information reviewed with POA

## 2020-04-04 NOTE — Discharge Instructions (Signed)
Advised to follow-up with primary care physician in 1 week.   Advised to follow-up outpatient with neurology as scheduled ,  appointment been made. Advised to take hydralazine 50 mg 3 times daily and losartan 50 mg daily for hypertension.

## 2020-04-06 ENCOUNTER — Telehealth: Payer: Self-pay | Admitting: *Deleted

## 2020-04-06 NOTE — Telephone Encounter (Signed)
I had previously discussed option of hospice but family wanted to try home health. Gerald [name] - do you have planned follow up in the home with family?

## 2020-04-06 NOTE — Telephone Encounter (Signed)
Daughter called and cnl patient's apt next Tuesday with Dr. Rogue Bussing. Patient is unable to come to the apt due to decrease mobility and decline in health. Family does not wish to r/s at this time. She has a home health nurse coming out this weekend. She will have the home health nurse contact our office if patient's status should change.

## 2020-04-07 ENCOUNTER — Telehealth: Payer: Self-pay | Admitting: Nurse Practitioner

## 2020-04-07 NOTE — Telephone Encounter (Signed)
I called Gerald [name], Gerald [name] daughter to schedule f/u visit. Gerald [name] updated on clinical condition, therapy was currently in the home working with Gerald [name]. Follow-up appointment made

## 2020-04-11 ENCOUNTER — Inpatient Hospital Stay: Payer: Medicare Other | Admitting: Internal Medicine

## 2020-04-21 ENCOUNTER — Other Ambulatory Visit: Payer: Medicare Other | Admitting: Nurse Practitioner

## 2020-04-21 ENCOUNTER — Other Ambulatory Visit: Payer: Self-pay

## 2020-04-21 ENCOUNTER — Encounter: Payer: Self-pay | Admitting: Nurse Practitioner

## 2020-04-21 DIAGNOSIS — Z515 Encounter for palliative care: Secondary | ICD-10-CM

## 2020-04-21 DIAGNOSIS — D469 Myelodysplastic syndrome, unspecified: Secondary | ICD-10-CM

## 2020-04-21 NOTE — Progress Notes (Signed)
[place] Consult Note Telephone: [phone]  Fax: [phone]  PATIENT NAME: Gerald [name] DOB: [id number] MRN: [id number]  PRIMARY CARE PROVIDER:   [name], MD  REFERRING PROVIDER:  [name], MD [address] [facility] [place],  [place] [address]  Due to the COVID-19 crisis, this visit was done via telemedicine from my office and it was initiated and consent by this patient and or family.  RESPONSIBLE PARTY:Lillian Mateo Flow daughter [phone]  complex medical decision making. 1.Advance Care Planning; full code  2. Goals of Care: Goals include to maximize quality of life and symptom management. Our advance care planning conversation included a discussion about:   The value and importance of advance care planning  Exploration of personal, cultural or spiritual beliefs that might influence medical decisions  Exploration of goals of care in the event of a sudden injury or illness  Identification and preparation of a healthcare agent  Review and updating or creation of anadvance directive document.  3.Palliative care encounter; Palliative care encounter; Palliative medicine team will continue to support patient, patient's family, and medical team. Visit consisted of counseling and education dealing with the complex and emotionally intense issues of symptom management and palliative care in the setting of serious and potentially life-threatening illness  4. f/u 4 weeks or sooner if declines  I spent 40 minutes providing this consultation,  from 10:30am to 11:10am. More than 50% of the time in this consultation was spent coordinating communication.   HISTORY OF PRESENT ILLNESS:  Gerald [name] is a 87 y.o. year old male with multiple medical problems including Myelodysplastic syndrome, testicular cancer s/p sgy, prostate cancer, history basal cell carcinoma, hemochromatosis,  hypertension, anemia, appendectomy, cholecystectomy, bilateral hands surgery, kyphoplasty. Follow up Palliative care visit for Gerald [name] changed from in person to telemedicine telephonic is video not available due to covid pandemic. Gerald [name], Mr. Markowicz daughter, confirmed okay to transition to telemedicine. Gerald [name] in agreement. We talked about purpose of Palliative care visit. Gerald [name] in agreement. We talked about how Gerald [name] has been doing since he has returned home from the hospital. Gerald [name] endorses Mr. Mcginley is doing better he seems to be back to his baseline prior to hospitalization. Gerald [name] endorses speech continues to work with Gerald [name] though share that they may be able to discharge him early as he is doing well. Appetite has continued to improve. PT/OT continues to work with Gerald [name]. Gerald [name] endorses Gerald [name] is able to walk a step or two with his walker though they provide him moderate to maximum assistance. Gerald [name] endorses he does continue to sleep alot. Gerald [name] endorses he is doing okay. We talked about medical goals of care. We talked about quality of life. We talked about continuing to work with therapy to increase strength and hopes of giving him a better quality of life. We talked about the sacral area that does cause Gerald [name] some discomfort. We talked about rotating positions often. We talked about sleeping and sleep pattern, hygiene. We talked about symptoms. We talked about role of palliative care in plan of care. Family continues to rotate in caregiving for [name], very supportive family. We talked about follow up Palliative care visit when therapy is completed to see how he is doing. Gerald [name] in agreement, appointments scheduled. Therapeutic listening and emotional support provided. Contact information. Questions answered to satisfaction.  Palliative Care was asked to help to continue to address goals of care.  CODE STATUS: Full  code  PPS: 30% HOSPICE ELIGIBILITY/DIAGNOSIS: TBD  PAST MEDICAL HISTORY:  Past Medical History:  Diagnosis Date  . Anemia   . Arthritis   . Atrial fibrillation ([employer])   . Cancer [facility])    Testicular  . Hemochromatosis 10/25/2014  . Hx of basal cell carcinoma 2008   R. ant. zygomatic/sideburn 2008, L preauricular 2019  . Hypertension   . Prostate cancer [facility])     SOCIAL HX:  Social History   Tobacco Use  . Smoking status: Former Smoker    Packs/day: 1.00    Years: 25.00    Pack years: 25.00    Types: Cigarettes    Quit date: 03/22/1958    Years since quitting: 62.1  . Smokeless tobacco: Never Used  Substance Use Topics  . Alcohol use: No    ALLERGIES:  Allergies  Allergen Reactions  . Levofloxacin Other (See Comments)    Taste alterations for about 1 month after taking med     PERTINENT MEDICATIONS:  Outpatient Encounter Medications as of 04/21/2020  Medication Sig  . acetaminophen (TYLENOL) 650 MG CR tablet Take 650 mg by mouth 2 (two) times daily as needed for pain.  [name] aspirin EC 81 MG tablet Take 81 mg by mouth every evening.   . Calcium-Magnesium-Zinc (CAL-MAG-ZINC PO) Take 1 tablet by mouth daily.  . Cholecalciferol (QC VITAMIN D3) 50 MCG (2000 UT) TABS Take 2,000 Units by mouth daily.  [name] docusate sodium (COLACE) 100 MG capsule Take 100 mg by mouth 2 (two) times daily. With breakfast & with supper  . ELDERBERRY PO Take 2 tablets by mouth daily. Gummy  . hydrALAZINE (APRESOLINE) 50 MG tablet Take 1 tablet (50 mg total) by mouth every 8 (eight) hours.  [name] ibuprofen (ADVIL) 200 MG tablet Take 200 mg by mouth 2 (two) times daily as needed (pain.).   [name] Lido-Capsaicin-Men-Methyl Sal (MEDI-PATCH-LIDOCAINE EX) Apply 1 patch topically daily. Applied to back  . losartan (COZAAR) 50 MG tablet Take 1 tablet (50 mg total) by mouth daily.  . Multiple Vitamins-Minerals (ADULT GUMMY PO) Take 2 tablets by mouth daily.  [name] nystatin (MYCOSTATIN/NYSTOP) powder Apply 1 application  topically 3 (three) times daily.  . Potassium 99 MG TABS Take 99 mg by mouth daily.   . Turmeric 500 MG TABS Take 500 mg by mouth at bedtime. w/Ginger  . vitamin B-12 (CYANOCOBALAMIN) 1000 MCG tablet Take 1,000 mcg by mouth daily.   No facility-administered encounter medications on file as of 04/21/2020.    PHYSICAL EXAM:   deferred  Kenzlee Fishburn Ihor Gully, NP

## 2020-05-02 ENCOUNTER — Observation Stay
Admission: EM | Admit: 2020-05-02 | Discharge: 2020-05-03 | Disposition: A | Discharge status: Home / Self Care | Payer: Medicare Other | Attending: Family Medicine | Admitting: Family Medicine

## 2020-05-02 ENCOUNTER — Emergency Department: Payer: Medicare Other

## 2020-05-02 ENCOUNTER — Other Ambulatory Visit: Payer: Self-pay

## 2020-05-02 ENCOUNTER — Encounter: Payer: Self-pay | Admitting: Emergency Medicine

## 2020-05-02 DIAGNOSIS — I5031 Acute diastolic (congestive) heart failure: Principal | ICD-10-CM | POA: Insufficient documentation

## 2020-05-02 DIAGNOSIS — I4891 Unspecified atrial fibrillation: Secondary | ICD-10-CM | POA: Diagnosis not present

## 2020-05-02 DIAGNOSIS — Z87891 Personal history of nicotine dependence: Secondary | ICD-10-CM | POA: Diagnosis not present

## 2020-05-02 DIAGNOSIS — I110 Hypertensive heart disease with heart failure: Secondary | ICD-10-CM | POA: Insufficient documentation

## 2020-05-02 DIAGNOSIS — Z79899 Other long term (current) drug therapy: Secondary | ICD-10-CM | POA: Insufficient documentation

## 2020-05-02 DIAGNOSIS — Z20822 Contact with and (suspected) exposure to covid-19: Secondary | ICD-10-CM | POA: Diagnosis not present

## 2020-05-02 DIAGNOSIS — D4620 Refractory anemia with excess of blasts, unspecified: Secondary | ICD-10-CM

## 2020-05-02 DIAGNOSIS — R269 Unspecified abnormalities of gait and mobility: Secondary | ICD-10-CM | POA: Diagnosis not present

## 2020-05-02 DIAGNOSIS — I639 Cerebral infarction, unspecified: Secondary | ICD-10-CM | POA: Diagnosis not present

## 2020-05-02 DIAGNOSIS — R7989 Other specified abnormal findings of blood chemistry: Secondary | ICD-10-CM | POA: Diagnosis present

## 2020-05-02 DIAGNOSIS — I10 Essential (primary) hypertension: Secondary | ICD-10-CM | POA: Diagnosis not present

## 2020-05-02 DIAGNOSIS — F0390 Unspecified dementia without behavioral disturbance: Secondary | ICD-10-CM | POA: Insufficient documentation

## 2020-05-02 DIAGNOSIS — I5033 Acute on chronic diastolic (congestive) heart failure: Secondary | ICD-10-CM | POA: Diagnosis present

## 2020-05-02 DIAGNOSIS — J90 Pleural effusion, not elsewhere classified: Secondary | ICD-10-CM | POA: Diagnosis present

## 2020-05-02 DIAGNOSIS — D649 Anemia, unspecified: Secondary | ICD-10-CM

## 2020-05-02 DIAGNOSIS — Z8546 Personal history of malignant neoplasm of prostate: Secondary | ICD-10-CM | POA: Diagnosis not present

## 2020-05-02 DIAGNOSIS — R000 Tachycardia, unspecified: Secondary | ICD-10-CM | POA: Diagnosis present

## 2020-05-02 DIAGNOSIS — E43 Unspecified severe protein-calorie malnutrition: Secondary | ICD-10-CM | POA: Insufficient documentation

## 2020-05-02 DIAGNOSIS — Z8547 Personal history of malignant neoplasm of testis: Secondary | ICD-10-CM | POA: Diagnosis not present

## 2020-05-02 DIAGNOSIS — R778 Other specified abnormalities of plasma proteins: Secondary | ICD-10-CM | POA: Diagnosis present

## 2020-05-02 DIAGNOSIS — Z7982 Long term (current) use of aspirin: Secondary | ICD-10-CM | POA: Diagnosis not present

## 2020-05-02 DIAGNOSIS — D46Z Other myelodysplastic syndromes: Secondary | ICD-10-CM | POA: Diagnosis present

## 2020-05-02 LAB — URINALYSIS, COMPLETE (UACMP) WITH MICROSCOPIC
Bacteria, UA: NONE SEEN
Bilirubin Urine: NEGATIVE
Glucose, UA: NEGATIVE mg/dL
Hgb urine dipstick: NEGATIVE
Ketones, ur: NEGATIVE mg/dL
Leukocytes,Ua: NEGATIVE
Nitrite: NEGATIVE
Protein, ur: NEGATIVE mg/dL
Specific Gravity, Urine: 1.02 (ref 1.005–1.030)
pH: 6 (ref 5.0–8.0)

## 2020-05-02 LAB — CBC
HCT: 30.8 % — ABNORMAL LOW (ref 39.0–52.0)
Hemoglobin: 9.6 g/dL — ABNORMAL LOW (ref 13.0–17.0)
MCH: 29.4 pg (ref 26.0–34.0)
MCHC: 31.2 g/dL (ref 30.0–36.0)
MCV: 94.2 fL (ref 80.0–100.0)
Platelets: 372 10*3/uL (ref 150–400)
RBC: 3.27 MIL/uL — ABNORMAL LOW (ref 4.22–5.81)
RDW: 23 % — ABNORMAL HIGH (ref 11.5–15.5)
WBC: 8.8 10*3/uL (ref 4.0–10.5)
nRBC: 0 % (ref 0.0–0.2)

## 2020-05-02 LAB — BASIC METABOLIC PANEL
Anion gap: 10 (ref 5–15)
BUN: 31 mg/dL — ABNORMAL HIGH (ref 8–23)
CO2: 24 mmol/L (ref 22–32)
Calcium: 9.6 mg/dL (ref 8.9–10.3)
Chloride: 108 mmol/L (ref 98–111)
Creatinine, Ser: 1.07 mg/dL (ref 0.61–1.24)
GFR, Estimated: >60 mL/min (ref >60)
Glucose, Bld: 113 mg/dL — ABNORMAL HIGH (ref 70–99)
Potassium: 3.5 mmol/L (ref 3.5–5.1)
Sodium: 142 mmol/L (ref 135–145)

## 2020-05-02 LAB — MAGNESIUM: Magnesium: 2 mg/dL (ref 1.7–2.4)

## 2020-05-02 LAB — BRAIN NATRIURETIC PEPTIDE: B Natriuretic Peptide: 319.6 pg/mL — ABNORMAL HIGH (ref 0.0–100.0)

## 2020-05-02 LAB — TROPONIN I (HIGH SENSITIVITY)
Troponin I (High Sensitivity): 19 ng/L — ABNORMAL HIGH (ref <18)
Troponin I (High Sensitivity): 19 ng/L — ABNORMAL HIGH (ref <18)

## 2020-05-02 MED ORDER — VITAMIN B-12 1000 MCG PO TABS
1000.0000 ug | ORAL_TABLET | Freq: Every day | ORAL | Status: DC
  Administered 2020-05-03: 1000 ug via ORAL
  Filled 2020-05-02: qty 1

## 2020-05-02 MED ORDER — VITAMIN D3 25 MCG (1000 UNIT) PO TABS
2000.0000 [IU] | ORAL_TABLET | Freq: Every day | ORAL | Status: DC
  Administered 2020-05-03: 2000 [IU] via ORAL
  Filled 2020-05-02 (×2): qty 2

## 2020-05-02 MED ORDER — LIDOCAINE 5 % EX PTCH
1.0000 | MEDICATED_PATCH | CUTANEOUS | Status: DC
  Administered 2020-05-03: 1 via TRANSDERMAL
  Filled 2020-05-02 (×2): qty 1

## 2020-05-02 MED ORDER — IOHEXOL 300 MG/ML  SOLN
100.0000 mL | Freq: Once | INTRAMUSCULAR | Status: AC | PRN
  Administered 2020-05-02: 100 mL via INTRAVENOUS

## 2020-05-02 MED ORDER — ACETAMINOPHEN 325 MG PO TABS
650.0000 mg | ORAL_TABLET | Freq: Four times a day (QID) | ORAL | Status: DC | PRN
  Administered 2020-05-02: 650 mg via ORAL
  Filled 2020-05-02: qty 2

## 2020-05-02 MED ORDER — DILTIAZEM HCL-DEXTROSE 125-5 MG/125ML-% IV SOLN (PREMIX)
5.0000 mg/h | INTRAVENOUS | Status: DC
  Administered 2020-05-02: 5 mg/h via INTRAVENOUS
  Administered 2020-05-03: 15 mg/h via INTRAVENOUS
  Filled 2020-05-02 (×2): qty 125

## 2020-05-02 MED ORDER — LOSARTAN POTASSIUM 50 MG PO TABS
50.0000 mg | ORAL_TABLET | Freq: Every day | ORAL | Status: DC
  Administered 2020-05-03: 50 mg via ORAL
  Filled 2020-05-02: qty 1

## 2020-05-02 MED ORDER — DM-GUAIFENESIN ER 30-600 MG PO TB12: 1.0000 | ORAL_TABLET | Freq: Two times a day (BID) | ORAL | Status: DC | PRN

## 2020-05-02 MED ORDER — METOPROLOL SUCCINATE ER 25 MG PO TB24
12.5000 mg | ORAL_TABLET | Freq: Every day | ORAL | Status: DC
  Administered 2020-05-03: 12.5 mg via ORAL
  Filled 2020-05-02: qty 1

## 2020-05-02 MED ORDER — SODIUM CHLORIDE 0.9% FLUSH
3.0000 mL | Freq: Two times a day (BID) | INTRAVENOUS | Status: DC
  Administered 2020-05-03: 3 mL via INTRAVENOUS

## 2020-05-02 MED ORDER — FUROSEMIDE 10 MG/ML IJ SOLN: 20.0000 mg | Freq: Two times a day (BID) | INTRAMUSCULAR | Status: DC

## 2020-05-02 MED ORDER — ALBUTEROL SULFATE HFA 108 (90 BASE) MCG/ACT IN AERS
2.0000 | INHALATION_SPRAY | RESPIRATORY_TRACT | Status: DC | PRN
  Filled 2020-05-02: qty 6.7

## 2020-05-02 MED ORDER — DOCUSATE SODIUM 100 MG PO CAPS: 100.0000 mg | ORAL_CAPSULE | Freq: Every evening | ORAL | Status: DC

## 2020-05-02 MED ORDER — ASPIRIN EC 81 MG PO TBEC
81.0000 mg | DELAYED_RELEASE_TABLET | Freq: Every day | ORAL | Status: DC
  Administered 2020-05-03: 81 mg via ORAL
  Filled 2020-05-02: qty 1

## 2020-05-02 MED ORDER — HYDRALAZINE HCL 20 MG/ML IJ SOLN: 5.0000 mg | INTRAMUSCULAR | Status: DC | PRN

## 2020-05-02 MED ORDER — ENOXAPARIN SODIUM 40 MG/0.4ML ~~LOC~~ SOLN
40.0000 mg | SUBCUTANEOUS | Status: DC
  Administered 2020-05-02: 40 mg via SUBCUTANEOUS
  Filled 2020-05-02: qty 0.4

## 2020-05-02 MED ORDER — POLYETHYLENE GLYCOL 3350 17 G PO PACK
17.0000 g | PACK | Freq: Every day | ORAL | Status: DC
  Administered 2020-05-03: 17 g via ORAL
  Filled 2020-05-02: qty 1

## 2020-05-02 MED ORDER — LIDO-CAPSAICIN-MEN-METHYL SAL 0.5-0.035-5-20 % EX PTCH: 1.0000 | MEDICATED_PATCH | Freq: Two times a day (BID) | CUTANEOUS | Status: DC

## 2020-05-02 MED ORDER — SODIUM CHLORIDE 0.9 % IV SOLN: 250.0000 mL | INTRAVENOUS | Status: DC | PRN

## 2020-05-02 MED ORDER — ONDANSETRON HCL 4 MG/2ML IJ SOLN: 4.0000 mg | Freq: Three times a day (TID) | INTRAMUSCULAR | Status: DC | PRN

## 2020-05-02 MED ORDER — SODIUM CHLORIDE 0.9% FLUSH
3.0000 mL | INTRAVENOUS | Status: DC | PRN
Start: 2020-05-02 — End: 2020-05-03

## 2020-05-02 MED ORDER — FUROSEMIDE 10 MG/ML IJ SOLN
20.0000 mg | Freq: Two times a day (BID) | INTRAMUSCULAR | Status: DC
  Administered 2020-05-03: 20 mg via INTRAVENOUS
  Filled 2020-05-02: qty 2

## 2020-05-02 MED ORDER — DILTIAZEM LOAD VIA INFUSION
20.0000 mg | Freq: Once | INTRAVENOUS | Status: AC
  Administered 2020-05-02: 20 mg via INTRAVENOUS
  Filled 2020-05-02: qty 20

## 2020-05-02 MED ORDER — MORPHINE SULFATE (PF) 4 MG/ML IV SOLN
4.0000 mg | Freq: Once | INTRAVENOUS | Status: AC
  Administered 2020-05-02: 4 mg via INTRAVENOUS
  Filled 2020-05-02: qty 1

## 2020-05-02 MED ORDER — FUROSEMIDE 10 MG/ML IJ SOLN
60.0000 mg | Freq: Once | INTRAMUSCULAR | Status: AC
  Administered 2020-05-02: 60 mg via INTRAVENOUS
  Filled 2020-05-02: qty 8

## 2020-05-02 NOTE — ED Provider Notes (Signed)
[facility] Emergency Department Provider Note ____________________________________________   Event Date/Time   First MD Initiated Contact with Patient 05/02/20 1330     (approximate)  I have reviewed the triage vital signs and the nursing notes.  HISTORY  Chief Complaint Tachycardia   HPI Gerald [name] is a 87 y.o. malewho presents to the ED for evaluation of tachycardia.  Chart review indicates history of A. fib, HTN, prostate cancer. Patient presents to the ED with his daughter, who provides the majority of history.  Patient lives at home with family and home health caregivers providing care.  Nonambulatory for the past few months. Daughter reports a longstanding well-known history of A. fib but patient has remained in a sinus rhythm for a matter of years and has not been on rate control until the past 1-2 weeks.  Daughter reports that therapist and home health caregivers have noted tachycardic heart rates over the past 2 weeks, patient was just started on metoprolol 12.5 mg daily this past Friday, 4 days ago. Patient followed up this morning with cardiology as an outpatient to assess progress with metoprolol and possibly increase his dose, he was noted to be tachycardic and tachypneic at this clinic visit, and so was directed to the ED for further evaluation.  Patient is pleasantly disoriented and unable provide any relevant history.  Denies pain.    Past Medical History:  Diagnosis Date  . Anemia   . Arthritis   . Atrial fibrillation ([place])   . Cancer [facility])    Testicular  . Hemochromatosis 10/25/2014  . Hx of basal cell carcinoma 2008   R. ant. zygomatic/sideburn 2008, L preauricular 2019  . Hypertension   . Prostate cancer [facility])     Patient Active Problem List   Diagnosis Date Noted  . Palliative care encounter   . Acute proctitis 03/28/2020  . Acute encephalopathy 03/28/2020  . Pressure injury of sacral region, stage 2 ([place])  03/28/2020  . CVA (cerebral vascular accident) ([place]) 03/28/2020  . Leukocytosis 05/28/2019  . Left displaced femoral neck fracture ([place]) 05/27/2019  . MDS (myelodysplastic syndrome), low grade ([place]) 10/18/2016  . Anemia of chronic disease 10/21/2015  . Hemochromatosis 10/25/2014  . BP (high blood pressure) 10/21/2014  . CA of prostate ([place]) 10/21/2014  . Cancer of testicle ([place]) 10/21/2014  . Anemia 08/24/2014  . Dizziness 09/14/2013  . Difficulty in walking 09/14/2013  . 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 A-fib ([place]) 06/24/2013  . MI (mitral incompetence) 06/24/2013  . Absolute anemia 06/23/2013  . Arthritis, degenerative 06/23/2013  . Essential (primary) hypertension 06/23/2013    Past Surgical History:  Procedure Laterality Date  . ANTERIOR APPROACH HEMI HIP ARTHROPLASTY Left 05/28/2019   Procedure: ANTERIOR APPROACH HEMI HIP ARTHROPLASTY;  Surgeon: Hessie Knows, MD;  Location: [facility] ORS;  Service: Orthopedics;  Laterality: Left;  . APPENDECTOMY    . CHOLECYSTECTOMY    . HAND SURGERY Bilateral   . HERNIA REPAIR    . INNER EAR SURGERY    . KYPHOPLASTY N/A 12/14/2015   Procedure: KYPHOPLASTY  L2;  Surgeon: Hessie Knows, MD;  Location: [facility] ORS;  Service: Orthopedics;  Laterality: N/A;  . KYPHOPLASTY N/A 12/23/2019   Procedure: KYPHOPLASTY;  Surgeon: Hessie Knows, MD;  Location: [facility] ORS;  Service: Orthopedics;  Laterality: N/A;  . PROSTATE SURGERY    . SURGERY SCROTAL / TESTICULAR Right     Prior to Admission medications   Medication Sig Start Date End Date Taking? Authorizing Provider  acetaminophen (  TYLENOL) 650 MG CR tablet Take 650 mg by mouth 2 (two) times daily.   Yes [provider]  aspirin EC 81 MG tablet Take 81 mg by mouth every evening.    Yes [provider]  Calcium-Magnesium-Zinc (CAL-MAG-ZINC PO) Take 1 tablet by mouth daily.   Yes [provider]  Cholecalciferol (QC VITAMIN D3) 50 MCG (2000 UT) TABS Take 2,000 Units by mouth daily.    Yes [provider]  docusate sodium (COLACE) 100 MG capsule Take 100 mg by mouth every evening.   Yes [provider]  ELDERBERRY PO Take 2 tablets by mouth daily. Gummy   Yes [provider]  ibuprofen (ADVIL) 200 MG tablet Take 200 mg by mouth 2 (two) times daily.   Yes [provider]  Lido-Capsaicin-Men-Methyl Sal (MEDI-PATCH-LIDOCAINE EX) Apply 1 patch topically 2 (two) times daily. Applied to back   Yes [provider]  losartan (COZAAR) 50 MG tablet Take 1 tablet (50 mg total) by mouth daily. 04/05/20  Yes Shawna Clamp, MD  metoprolol succinate (TOPROL-XL) 25 MG 24 hr tablet Take 12.5 mg by mouth daily. 04/28/20  Yes [provider]  polyethylene glycol (MIRALAX / GLYCOLAX) 17 g packet Take 17 g by mouth daily.   Yes [provider]  Potassium 99 MG TABS Take 99 mg by mouth daily.    Yes [provider]  Turmeric 500 MG TABS Take 500 mg by mouth at bedtime. w/Ginger   Yes [provider]  vitamin B-12 (CYANOCOBALAMIN) 1000 MCG tablet Take 1,000 mcg by mouth daily.   Yes [provider]    Allergies Levofloxacin  Family History  Problem Relation Age of Onset  . Hypertension Mother     Social History Social History   Tobacco Use  . Smoking status: Former Smoker    Packs/day: 1.00    Years: 25.00    Pack years: 25.00    Types: Cigarettes    Quit date: 03/22/1958    Years since quitting: 62.1  . Smokeless tobacco: Never Used  Vaping Use  . Vaping Use: Never used  Substance Use Topics  . Alcohol use: No  . Drug use: No    Review of Systems  Unable to accurately assessed due to disoriented patient. ____________________________________________   PHYSICAL EXAM:  VITAL SIGNS: Vitals:   05/02/20 1445 05/02/20 1530  BP: 131/74   Pulse: 84   Resp: 14   Temp:    SpO2: 95% 93%     Constitutional: Alert and pleasantly disoriented. Follows commands in all 4 extremities.  Tachypnea noted. Eyes: Conjunctivae are normal. PERRL. EOMI. Head: Atraumatic. Nose: No congestion/rhinnorhea. Mouth/Throat: Mucous membranes are dry.  Oropharynx non-erythematous. Neck: No stridor. No cervical spine tenderness to palpation. Cardiovascular: Tachycardic and irregular rhythm. Grossly normal heart sounds.  Good peripheral circulation. Respiratory: Tachypneic to about 30. No retractions. Bibasilar crackles are noted. No wheezes. Gastrointestinal: Soft , nondistended.  No CVA tenderness. Apparent discomfort with suprapubic palpation, causing grimacing. Musculoskeletal: No lower extremity tenderness.  No joint effusions. No signs of acute trauma. Pitting edema to the right greater than left bilateral ankles are noted. No overlying skin changes or signs of trauma. Neurologic: No gross focal neurologic deficits are appreciated. Follows simple commands in all 4 Skin:  Skin is warm, dry and intact. No rash noted. Psychiatric: Mood and affect are normal. Speech and behavior are normal.  ____________________________________________   LABS (all labs ordered are listed, but only abnormal results are displayed)  Labs Reviewed  BASIC METABOLIC PANEL - Abnormal; Notable for the following components:      Result Value   Glucose, Bld 113 (*)    BUN 31 (*)    All other components within normal limits  CBC - Abnormal; Notable for the following components:   RBC 3.27 (*)    Hemoglobin 9.6 (*)    HCT 30.8 (*)    RDW 23.0 (*)    All other components within normal limits  URINALYSIS, COMPLETE (UACMP) WITH MICROSCOPIC - Abnormal; Notable for the following components:   Color, Urine YELLOW (*)    APPearance HAZY (*)    All other components within normal limits  TROPONIN I (HIGH SENSITIVITY) - Abnormal; Notable for the following components:   Troponin I (High Sensitivity) 19 (*)    All other components within normal limits  SARS CORONAVIRUS 2 (TAT 6-24 HRS)  MAGNESIUM  BRAIN  NATRIURETIC PEPTIDE  TROPONIN I (HIGH SENSITIVITY)   ____________________________________________  12 Lead EKG  A. fib, rate of 105 bpm. Normal axis and intervals. Stigmata of LVH. Lateral and inferior T wave inversions without STEMI criteria. ____________________________________________  RADIOLOGY  ED MD interpretation: 2 view CXR reviewed by me with pulmonary vascular congestion without discrete lobar filtration.  Official radiology report(s): DG Chest 2 View  Result Date: 05/02/2020 CLINICAL DATA:  87 year old male with history of shortness of breath and tachycardia. EXAM: CHEST - 2 VIEW COMPARISON:  Chest x-ray 03/28/2020. FINDINGS: Lung volumes are low. Widespread patchy areas of ill-defined opacity and interstitial prominence noted throughout the lungs bilaterally. Small bilateral pleural effusions. Pulmonary vasculature is largely obscured. Heart size is normal. The patient is rotated to the right on today's exam, resulting in distortion of the mediastinal contours and reduced diagnostic sensitivity and specificity for mediastinal pathology. Aortic atherosclerosis. IMPRESSION: 1. The appearance the chest could suggest congestive heart failure, however, given the lack of cardiac enlargement, this is unlikely to reflect systolic congestive heart failure but could represent diastolic failure. Alternatively, this could represent multilobar bilateral pneumonia from atypical infection such as viral infection. Further clinical evaluation is recommended. 2. Aortic atherosclerosis. Electronically Signed   By: Vinnie Langton M.D.   On: 05/02/2020 13:44   CT ABDOMEN PELVIS W CONTRAST  Result Date: 05/02/2020 CLINICAL DATA:  Abdominal pain. EXAM: CT ABDOMEN AND PELVIS WITH CONTRAST TECHNIQUE: Multidetector CT imaging of the abdomen and pelvis was performed using the standard protocol following bolus administration of intravenous contrast. CONTRAST:  187mL OMNIPAQUE IOHEXOL 300 MG/ML  SOLN  COMPARISON:  CT 03/28/2020 FINDINGS: Lower chest: Mild ground-glass opacity at the lung bases. Moderate bilateral pleural effusions. No significant change from prior. Hepatobiliary: Postcholecystectomy. There is mild intrahepatic and moderate extrahepatic biliary duct dilatation. The common hepatic duct measures 14 mm. The common bile duct measures 11 mm. These findings are slightly increased from comparison exam (hepatic duct measuring 12 mm and common bile duct measuring 10 mm). Cystic duct remnant is also noted. No obstructing lesion identified. Pancreas: Mild prominence of the pancreatic duct similar prior. No pancreatic inflammation. Spleen: Wedge-shaped defect in the spleen likely rest remote infarction. Similar prior. Adrenals/urinary tract: Adrenal glands normal. Bilateral mixed density renal cysts are again noted. Largest cyst measuring 2.4 cm in the LEFT kidney with high attenuation which could indicate enhancement. No renal obstruction. Ureters and bladder normal. Stomach/Bowel: Stomach, duodenum small-bowel normal. Appendix not identified. Ascending, transverse and descending colon normal. Normal stool volume. Multiple diverticula of the sigmoid colon. No acute inflammation. Rectum Vascular/Lymphatic: Abdominal  aorta is normal caliber with atherosclerotic calcification. There is no retroperitoneal or periportal lymphadenopathy. No pelvic lymphadenopathy. Reproductive: Post prostatectomy Other: No free fluid or abscess in the abdomen pelvis. Musculoskeletal: Multiple chronic compression fractures of the lumbar spine. No acute findings. IMPRESSION: 1. Bilateral moderate pleural effusions and mild pulmonary edema pattern not changed from comparison exam. 2. Chronic dilatation of the extrahepatic bile ducts similar to comparison exam. 3. Mild dilatation of the pancreatic duct similar to prior. No evidence of pancreatic mass or inflammation. 4. Stable bilateral renal cyst of mixed density. 5. Probable  peripheral infarction in the spleen similar to prior. 6. Diverticulosis of the LEFT colon without acute inflammation. Electronically Signed   By: Suzy Bouchard M.D.   On: 05/02/2020 15:38    ____________________________________________   PROCEDURES and INTERVENTIONS  Procedure(s) performed (including Critical Care):  .1-3 Lead EKG Interpretation Performed by: Vladimir Crofts, MD Authorized by: Vladimir Crofts, MD     Interpretation: abnormal     ECG rate:  124   ECG rate assessment: tachycardic     Rhythm: atrial fibrillation     Ectopy: none     Conduction: normal   .Critical Care Performed by: Vladimir Crofts, MD Authorized by: Vladimir Crofts, MD   Critical care provider statement:    Critical care time (minutes):  51   Critical care was necessary to treat or prevent imminent or life-threatening deterioration of the following conditions:  Circulatory failure, respiratory failure and cardiac failure   Critical care was time spent personally by me on the following activities:  Discussions with consultants, evaluation of patient's response to treatment, examination of patient, ordering and performing treatments and interventions, ordering and review of laboratory studies, ordering and review of radiographic studies, pulse oximetry, re-evaluation of patient's condition, obtaining history from patient or surrogate and review of old charts    Medications  diltiazem (CARDIZEM) 1 mg/mL load via infusion 20 mg (20 mg Intravenous Bolus from Bag 05/02/20 1427)    And  diltiazem (CARDIZEM) 125 mg in dextrose 5% 125 mL (1 mg/mL) infusion (5 mg/hr Intravenous New Bag/Given 05/02/20 1427)  morphine 4 MG/ML injection 4 mg (has no administration in time range)  iohexol (OMNIPAQUE) 300 MG/ML solution 100 mL (100 mLs Intravenous Contrast Given 05/02/20 1457)  furosemide (LASIX) injection 60 mg (60 mg Intravenous Given 05/02/20 1532)    ____________________________________________   MDM / ED COURSE    87 year old male with history of A. fib presents to the ED with evidence of CHF exacerbation and A. fib with RVR requiring medical admission.  Presented with tachycardic rates of A. fib with RVR, but hemodynamically stable.  Exam with stigmata of volume overload, tachypnea and dyspnea suggestive of a CHF exacerbation.  He is in no distress, has no evidence of neurovascular deficits or signs of trauma.  He does have diffuse, but primarily suprapubic, abdominal tenderness to palpation without peritoneal features.  Blood work without evidence of sepsis or infectious pathology.  EKG demonstrates A. fib with RVR without ischemic features, and he has no evidence of ACS.  CXR appears congested without discrete lobar filtration.  Cath urine without evidence of infection, so follow-up CT to assess for intra-abdominal pathology causing his apparent tenderness was performed, and demonstrates no evidence of acute intra-abdominal pathology.  Eventually converts to a sinus rhythm with rates in the 70s on a diltiazem drip.  We will initiate diuresis with 60 mg of IV Lasix and admit to hospitalist medicine for further work-up and management.  Clinical Course as of 05/02/20 [place] May 02, 2020  1432 Reassessed.  Back into sinus rhythm with rates in the 80s.  Discussed reassuring urinalysis with the daughter.  We discussed CT abdomen/pelvis to assess for intra-abdominal pathology considering his distention and discomfort.  She is agreeable. [DS]    Clinical Course User Index [DS] Vladimir Crofts, MD    ____________________________________________   FINAL CLINICAL IMPRESSION(S) / ED DIAGNOSES  Final diagnoses:  Acute on chronic diastolic congestive heart failure ([place])  Atrial fibrillation with RVR [facility])     ED Discharge Orders    None       Jnaya Butrick   Note:  This document was prepared using Dragon voice recognition software and may include unintentional dictation errors.   Vladimir Crofts,  MD 05/02/20 [phone]

## 2020-05-02 NOTE — H&P (Signed)
History and Physical    Gerald [name] HER:[id number] DOB: 04/04/1920 DOA: 05/02/2020  Referring MD/NP/PA:   PCP: [name], MD   Patient coming from:  The patient is coming from home.  Chief Complaint: shortness breath  HPI: Gerald [name] is a 87 y.o. male with medical history significant of dCHF, hypertension, stroke, atrial fibrillation not on anticoagulants, prostate cancer, testicular cancer, hemochromatosis, MDS, anemia, dementia, who presents with shortness of breath.  Per his daughter, patient has dementia, intermittently confused, mental status is at his baseline currently.  He lives at home with family and the home health caregivers to provide care.  Patient has been nonambulatory for several months. Patient has long history of atrial fibrillation, but patient has remained in a sinus rhythm for many years.  Recently patient was found to have rapid heart rate in the past 2 weeks.  He was started on metoprolol 12.5 mg daily 4 days ago by cardiologist. Patient followed up this morning with cardiology as an outpatient to assess progress with metoprolol and possibly increase his dose. Pt was noted to have A fib with RVR. He was sent to ED for further evaluation. Pt has some shortness breath, no cough, chest pain, fever or chills.  No nausea vomiting, diarrhea, abdominal pain, symptoms of UTI.   ED Course: pt was found to have WBC 8.8, BNP 319, troponin level 19, pending COVID-19 PCR, negative urinalysis, electrolytes renal function okay, temperature normal, blood pressure 164/1 1, 131/74, A. fib with RVR with heart rate 125, RR 30, 14, oxygen saturation 93% on room air.  Chest x-ray showed pulmonary edema without cardiomegaly.  Patient is admitted to progressive bed as inpatient.  CT abdomen/pelvis: 1. Bilateral moderate pleural effusions and mild pulmonary edema pattern not changed from comparison exam. 2. Chronic dilatation of the extrahepatic bile ducts similar  to comparison exam. 3. Mild dilatation of the pancreatic duct similar to prior. No evidence of pancreatic mass or inflammation. 4. Stable bilateral renal cyst of mixed density. 5. Probable peripheral infarction in the spleen similar to prior. 6. Diverticulosis of the LEFT colon without acute inflammation   Review of Systems: Could not be reviewed due to dementia   Allergy:  Allergies  Allergen Reactions  . Levofloxacin Other (See Comments)    Taste alterations for about 1 month after taking med    Past Medical History:  Diagnosis Date  . Anemia   . Arthritis   . Atrial fibrillation ([place])   . Cancer [facility])    Testicular  . Hemochromatosis 10/25/2014  . Hx of basal cell carcinoma 2008   R. ant. zygomatic/sideburn 2008, L preauricular 2019  . Hypertension   . Prostate cancer [facility])     Past Surgical History:  Procedure Laterality Date  . ANTERIOR APPROACH HEMI HIP ARTHROPLASTY Left 05/28/2019   Procedure: ANTERIOR APPROACH HEMI HIP ARTHROPLASTY;  Surgeon: Hessie Knows, MD;  Location: [facility] ORS;  Service: Orthopedics;  Laterality: Left;  . APPENDECTOMY    . CHOLECYSTECTOMY    . HAND SURGERY Bilateral   . HERNIA REPAIR    . INNER EAR SURGERY    . KYPHOPLASTY N/A 12/14/2015   Procedure: KYPHOPLASTY  L2;  Surgeon: Hessie Knows, MD;  Location: [facility] ORS;  Service: Orthopedics;  Laterality: N/A;  . KYPHOPLASTY N/A 12/23/2019   Procedure: KYPHOPLASTY;  Surgeon: Hessie Knows, MD;  Location: [facility] ORS;  Service: Orthopedics;  Laterality: N/A;  . PROSTATE SURGERY    . SURGERY SCROTAL / TESTICULAR Right  Social History:  reports that he quit smoking about 62 years ago. His smoking use included cigarettes. He has a 25.00 pack-year smoking history. He has never used smokeless tobacco. He reports that he does not drink alcohol and does not use drugs.  Family History:  Family History  Problem Relation Age of Onset  . Hypertension Mother      Prior to Admission medications    Medication Sig Start Date End Date Taking? Authorizing Provider  acetaminophen (TYLENOL) 650 MG CR tablet Take 650 mg by mouth 2 (two) times daily.   Yes [provider]  aspirin EC 81 MG tablet Take 81 mg by mouth every evening.    Yes [provider]  Calcium-Magnesium-Zinc (CAL-MAG-ZINC PO) Take 1 tablet by mouth daily.   Yes [provider]  Cholecalciferol (QC VITAMIN D3) 50 MCG (2000 UT) TABS Take 2,000 Units by mouth daily.   Yes [provider]  docusate sodium (COLACE) 100 MG capsule Take 100 mg by mouth every evening.   Yes [provider]  ELDERBERRY PO Take 2 tablets by mouth daily. Gummy   Yes [provider]  ibuprofen (ADVIL) 200 MG tablet Take 200 mg by mouth 2 (two) times daily.   Yes [provider]  Lido-Capsaicin-Men-Methyl Sal (MEDI-PATCH-LIDOCAINE EX) Apply 1 patch topically 2 (two) times daily. Applied to back   Yes [provider]  losartan (COZAAR) 50 MG tablet Take 1 tablet (50 mg total) by mouth daily. 04/05/20  Yes Shawna Clamp, MD  metoprolol succinate (TOPROL-XL) 25 MG 24 hr tablet Take 12.5 mg by mouth daily. 04/28/20  Yes [provider]  polyethylene glycol (MIRALAX / GLYCOLAX) 17 g packet Take 17 g by mouth daily.   Yes [provider]  Potassium 99 MG TABS Take 99 mg by mouth daily.    Yes [provider]  Turmeric 500 MG TABS Take 500 mg by mouth at bedtime. w/Ginger   Yes [provider]  vitamin B-12 (CYANOCOBALAMIN) 1000 MCG tablet Take 1,000 mcg by mouth daily.   Yes [provider]    Physical Exam: Vitals:   05/02/20 1435 05/02/20 1445 05/02/20 1450 05/02/20 1530  BP: 132/75 131/74 (!) 153/68   Pulse: 69 84 84   Resp: (!) 30 14 (!) 21   Temp:      TempSrc:      SpO2: 93% 95% 94% 93%  Weight:      Height:       General: Not in acute distress HEENT:       Eyes: PERRL, EOMI, no scleral icterus.       ENT: No discharge from the  ears and nose, no pharynx injection, no tonsillar enlargement.        Neck: No JVD, no bruit, no mass felt. Heme: No neck lymph node enlargement. Cardiac: S1/S2, irregularly irregular rhythm, no murmurs, No gallops or rubs. Respiratory: No rales, wheezing, rhonchi or rubs. GI: Soft, nondistended, nontender, no organomegaly, BS present. GU: No hematuria Ext: Trace leg edema bilaterally. 1+DP/PT pulse bilaterally. Musculoskeletal: No joint deformities, No joint redness or warmth, no limitation of ROM in spin. Skin: No rashes.  Neuro: Alert, pleasant, not oriented X3, cranial nerves II-XII grossly intact, moves all extremities. Psych: Patient is not psychotic, no suicidal or hemocidal ideation.  Labs on Admission: I have personally reviewed following labs and imaging studies  CBC: Recent Labs  Lab 05/02/20 1302  WBC 8.8  HGB 9.6*  HCT 30.8*  MCV 94.2  PLT 413   Basic Metabolic Panel: Recent Labs  Lab 05/02/20 1302 05/02/20 1305  NA 142  --   K 3.5  --   CL 108  --   CO2 24  --   GLUCOSE 113*  --   BUN 31*  --   CREATININE 1.07  --   CALCIUM 9.6  --   MG  --  2.0   GFR: Estimated Creatinine Clearance: 37.5 mL/min (by C-G formula based on SCr of 1.07 mg/dL). Liver Function Tests: No results for input(s): AST, ALT, ALKPHOS, BILITOT, PROT, ALBUMIN in the last 168 hours. No results for input(s): LIPASE, AMYLASE in the last 168 hours. No results for input(s): AMMONIA in the last 168 hours. Coagulation Profile: No results for input(s): INR, PROTIME in the last 168 hours. Cardiac Enzymes: No results for input(s): CKTOTAL, CKMB, CKMBINDEX, TROPONINI in the last 168 hours. BNP (last 3 results) No results for input(s): PROBNP in the last 8760 hours. HbA1C: No results for input(s): HGBA1C in the last 72 hours. CBG: No results for input(s): GLUCAP in the last 168 hours. Lipid Profile: No results for input(s): CHOL, HDL, LDLCALC, TRIG, CHOLHDL, LDLDIRECT in the last 72  hours. Thyroid Function Tests: No results for input(s): TSH, T4TOTAL, FREET4, T3FREE, THYROIDAB in the last 72 hours. Anemia Panel: No results for input(s): VITAMINB12, FOLATE, FERRITIN, TIBC, IRON, RETICCTPCT in the last 72 hours. Urine analysis:    Component Value Date/Time   COLORURINE YELLOW (A) 05/02/2020 1352   APPEARANCEUR HAZY (A) 05/02/2020 1352   APPEARANCEUR Clear 05/05/2013 1500   LABSPEC 1.020 05/02/2020 1352   LABSPEC 1.014 05/05/2013 1500   PHURINE 6.0 05/02/2020 1352   GLUCOSEU NEGATIVE 05/02/2020 1352   GLUCOSEU Negative 05/05/2013 1500   HGBUR NEGATIVE 05/02/2020 1352   BILIRUBINUR NEGATIVE 05/02/2020 1352   BILIRUBINUR Negative 05/05/2013 1500   KETONESUR NEGATIVE 05/02/2020 1352   PROTEINUR NEGATIVE 05/02/2020 1352   NITRITE NEGATIVE 05/02/2020 1352   LEUKOCYTESUR NEGATIVE 05/02/2020 1352   LEUKOCYTESUR Negative 05/05/2013 1500   Sepsis Labs: @LABRCNTIP (procalcitonin:4,lacticidven:4) )No results found for this or any previous visit (from the past 240 hour(s)).   Radiological Exams on Admission: DG Chest 2 View  Result Date: 05/02/2020 CLINICAL DATA:  87 year old male with history of shortness of breath and tachycardia. EXAM: CHEST - 2 VIEW COMPARISON:  Chest x-ray 03/28/2020. FINDINGS: Lung volumes are low. Widespread patchy areas of ill-defined opacity and interstitial prominence noted throughout the lungs bilaterally. Small bilateral pleural effusions. Pulmonary vasculature is largely obscured. Heart size is normal. The patient is rotated to the right on today's exam, resulting in distortion of the mediastinal contours and reduced diagnostic sensitivity and specificity for mediastinal pathology. Aortic atherosclerosis. IMPRESSION: 1. The appearance the chest could suggest congestive heart failure, however, given the lack of cardiac enlargement, this is unlikely to reflect systolic congestive heart failure but could represent diastolic failure. Alternatively, this  could represent multilobar bilateral pneumonia from atypical infection such as viral infection. Further clinical evaluation is recommended. 2. Aortic atherosclerosis. Electronically Signed   By: Vinnie Langton M.D.   On: 05/02/2020 13:44   CT ABDOMEN PELVIS W CONTRAST  Result Date: 05/02/2020 CLINICAL DATA:  Abdominal pain. EXAM: CT ABDOMEN AND PELVIS WITH CONTRAST TECHNIQUE: Multidetector CT imaging of the abdomen and pelvis was performed using the standard protocol following bolus administration of intravenous contrast. CONTRAST:  158mL OMNIPAQUE IOHEXOL 300 MG/ML  SOLN COMPARISON:  CT 03/28/2020 FINDINGS: Lower chest: Mild ground-glass opacity at the lung bases. Moderate  bilateral pleural effusions. No significant change from prior. Hepatobiliary: Postcholecystectomy. There is mild intrahepatic and moderate extrahepatic biliary duct dilatation. The common hepatic duct measures 14 mm. The common bile duct measures 11 mm. These findings are slightly increased from comparison exam (hepatic duct measuring 12 mm and common bile duct measuring 10 mm). Cystic duct remnant is also noted. No obstructing lesion identified. Pancreas: Mild prominence of the pancreatic duct similar prior. No pancreatic inflammation. Spleen: Wedge-shaped defect in the spleen likely rest remote infarction. Similar prior. Adrenals/urinary tract: Adrenal glands normal. Bilateral mixed density renal cysts are again noted. Largest cyst measuring 2.4 cm in the LEFT kidney with high attenuation which could indicate enhancement. No renal obstruction. Ureters and bladder normal. Stomach/Bowel: Stomach, duodenum small-bowel normal. Appendix not identified. Ascending, transverse and descending colon normal. Normal stool volume. Multiple diverticula of the sigmoid colon. No acute inflammation. Rectum Vascular/Lymphatic: Abdominal aorta is normal caliber with atherosclerotic calcification. There is no retroperitoneal or periportal lymphadenopathy. No  pelvic lymphadenopathy. Reproductive: Post prostatectomy Other: No free fluid or abscess in the abdomen pelvis. Musculoskeletal: Multiple chronic compression fractures of the lumbar spine. No acute findings. IMPRESSION: 1. Bilateral moderate pleural effusions and mild pulmonary edema pattern not changed from comparison exam. 2. Chronic dilatation of the extrahepatic bile ducts similar to comparison exam. 3. Mild dilatation of the pancreatic duct similar to prior. No evidence of pancreatic mass or inflammation. 4. Stable bilateral renal cyst of mixed density. 5. Probable peripheral infarction in the spleen similar to prior. 6. Diverticulosis of the LEFT colon without acute inflammation. Electronically Signed   By: Suzy Bouchard M.D.   On: 05/02/2020 15:38     EKG: I have personally reviewed.  Atrial fibrillation, QTc 428, T wave inversion in V4-V6  Assessment/Plan Principal Problem:   Acute on chronic diastolic CHF (congestive heart failure) (HCC) Active Problems:   Essential (primary) hypertension   MDS (myelodysplastic syndrome), low grade (HCC)   CVA (cerebral vascular accident) ([place])   Atrial fibrillation with RVR (HCC)   Normocytic anemia   Pleural effusion, bilateral   Elevated troponin   Acute on chronic diastolic CHF (congestive heart failure) ([place]): 2D echo on 03/28/2020 showed a EF of 50 to 55%.  Patient has trace leg edema, elevated BNP 319, chest x-ray showed pulmonary edema, clinically consistent with CHF exacerbation.  -Will admit to progressive unit as inpatient -Lasix 20 mg bid by IV (pt received 60 mg Lasix in ED) -2d echo -Daily weights -strict I/O's -Low salt diet -Fluid restriction -Obtain REDs Vest reading  Atrial fibrillation with RVR: Not taking anticoagulants. -Continue metoprolol -Cardizem drip started in ED  Elevated troponin level: 19 --> 19, already plateaued.  No chest pain she most likely due to demand ischemia -Aspirin, metoprolol  Essential  (primary) hypertension: -IV hydralazine as needed -Metoprolol -On Cardizem drip currently  MDS (myelodysplastic syndrome), low grade and normocytic anemia: Hemoglobin stable, 9.6 (9.1 on 04/03/2020) -Follow-up with CBC  CVA (cerebral vascular accident) ([place]) -Aspirin  Pleural effusion, bilateral: Likely due to CHF -On IV Lasix as above        DVT ppx: SQ Lovenox Code Status: Full code Family Communication: Yes, patient's daughter at bed side Disposition Plan:  Anticipate discharge back to previous environment Consults called: Dr. Saralyn Pilar of cardiology Admission status and Level of care: Progressive Cardiac:    as inpt       Status is: Inpatient  Remains inpatient appropriate because:Inpatient level of care appropriate due to severity of illness  Dispo: The patient is from: Home              Anticipated d/c is to: Home              Anticipated d/c date is: 2 days              Patient currently is not medically stable to d/c.   Difficult to place patient No        Date of Service 05/02/2020    [facility]   If 7PM-7AM, please contact night-coverage [email or web address] 05/02/2020, 5:38 PM

## 2020-05-02 NOTE — ED Notes (Signed)
Patient is alert and in nad.  Skin warm and dry.  Says she feels better now.

## 2020-05-02 NOTE — ED Notes (Signed)
Report given to the floor RN. [name]

## 2020-05-02 NOTE — ED Notes (Signed)
Bladder scan of 16ml

## 2020-05-02 NOTE — ED Triage Notes (Signed)
Patient to ER from [facility] for c/o tachycardia and shortness of breath. Patient was picked up by EMS to go to appt this am (HR was 125 at that time). EMS called back to [facility] to bring patient over d/t being unable to stand/walk. Patient's HR again was 125.  Patient's daughter states he recently started taking Metoprolol for tachycardia/A-Fib (since Friday afternoon). States HR at time of prescribing was in 120's.

## 2020-05-03 DIAGNOSIS — I5033 Acute on chronic diastolic (congestive) heart failure: Secondary | ICD-10-CM | POA: Diagnosis not present

## 2020-05-03 DIAGNOSIS — E43 Unspecified severe protein-calorie malnutrition: Secondary | ICD-10-CM | POA: Insufficient documentation

## 2020-05-03 DIAGNOSIS — I5031 Acute diastolic (congestive) heart failure: Secondary | ICD-10-CM | POA: Diagnosis not present

## 2020-05-03 LAB — BASIC METABOLIC PANEL
Anion gap: 11 (ref 5–15)
BUN: 28 mg/dL — ABNORMAL HIGH (ref 8–23)
CO2: 25 mmol/L (ref 22–32)
Calcium: 9.6 mg/dL (ref 8.9–10.3)
Chloride: 108 mmol/L (ref 98–111)
Creatinine, Ser: 0.93 mg/dL (ref 0.61–1.24)
GFR, Estimated: >60 mL/min (ref >60)
Glucose, Bld: 99 mg/dL (ref 70–99)
Potassium: 3.1 mmol/L — ABNORMAL LOW (ref 3.5–5.1)
Sodium: 144 mmol/L (ref 135–145)

## 2020-05-03 LAB — LIPID PANEL
Cholesterol: 112 mg/dL (ref 0–200)
HDL: 35 mg/dL — ABNORMAL LOW (ref >40)
LDL Cholesterol: 58 mg/dL (ref 0–99)
Total CHOL/HDL Ratio: 3.2 RATIO
Triglycerides: 95 mg/dL (ref <150)
VLDL: 19 mg/dL (ref 0–40)

## 2020-05-03 LAB — TSH: TSH: 2.335 u[IU]/mL (ref 0.350–4.500)

## 2020-05-03 LAB — SARS CORONAVIRUS 2 (TAT 6-24 HRS): SARS Coronavirus 2: NEGATIVE

## 2020-05-03 MED ORDER — DILTIAZEM HCL ER COATED BEADS 120 MG PO CP24: 120.0000 mg | ORAL_CAPSULE | Freq: Every day | ORAL | 3 refills | Status: AC

## 2020-05-03 MED ORDER — DILTIAZEM HCL ER COATED BEADS 120 MG PO CP24
120.0000 mg | ORAL_CAPSULE | Freq: Every day | ORAL | Status: DC
  Administered 2020-05-03: 120 mg via ORAL
  Filled 2020-05-03: qty 1

## 2020-05-03 MED ORDER — POTASSIUM CHLORIDE CRYS ER 20 MEQ PO TBCR
40.0000 meq | EXTENDED_RELEASE_TABLET | ORAL | Status: AC
Start: 2020-05-03 — End: 2020-05-03
  Administered 2020-05-03 (×2): 40 meq via ORAL
  Filled 2020-05-03 (×2): qty 2

## 2020-05-03 NOTE — Discharge Summary (Signed)
Physician Discharge Summary  DOLTON [name] GBT:[id number] DOB: [birth year] DOA: 05/02/2020  PCP: [name], MD  Admit date: 05/02/2020 Discharge date: 05/03/2020  Admitted From: Home  Disposition:  Home with [facility]  Recommendations for Outpatient Follow-up:  1. Follow up with Cardiology in 1 week     Home Health: PT/OT due to balance issues  Equipment/Devices: None new  Discharge Condition: Guarded given age CODE STATUS: DNR Diet recommendation: Cardiac  Brief/Interim Summary: Gerald [name] is a 87 y.o. M with dCHF, HTN, stroke, A. Fib not on AC, prosCA, testicular CA, MDS, hemochromatosis, and dementia who presented with shortness of breath.  Patient's function has been gradually declining.  In the last few weeks, he has been noted to be in A. fib, and was started on metoprolol XL by his cardiologist several days ago.  Despite this, he has been noted to have some respiratory distress, and appeared short of breath and so was sent to the ER.     PRINCIPAL HOSPITAL DIAGNOSIS: Acute diastolic CHF due to atrial fibrillation with RVR    Discharge Diagnoses:  Acute diastolic CHF due to atrial fibrillation with RVR Patient was started on diltiazem drip, his heart rate was controlled.  He was transitioned to oral diltiazem, his new metoprolol was continued, and his symptoms appeared improved to his baseline.  He was discharged home with family with home health following, and close cardiology follow-up.   Hypertension Cerebrovascular disease, secondary prevention Myelodysplastic syndrome Hemochromatosis Dementia           Discharge Instructions  Discharge Instructions    Increase activity slowly   Complete by: As directed    No wound care   Complete by: As directed      Allergies as of 05/03/2020      Reactions   Levofloxacin Other (See Comments)   Taste alterations for about 1 month after taking med      Medication List    TAKE these medications    acetaminophen 650 MG CR tablet Commonly known as: TYLENOL Take 650 mg by mouth 2 (two) times daily.   aspirin EC 81 MG tablet Take 81 mg by mouth every evening.   CAL-MAG-ZINC PO Take 1 tablet by mouth daily.   diltiazem 120 MG 24 hr capsule Commonly known as: CARDIZEM CD Take 1 capsule (120 mg total) by mouth daily. Start taking on: May 04, 2020   docusate sodium 100 MG capsule Commonly known as: COLACE Take 100 mg by mouth every evening.   ELDERBERRY PO Take 2 tablets by mouth daily. Gummy   ibuprofen 200 MG tablet Commonly known as: ADVIL Take 200 mg by mouth 2 (two) times daily.   losartan 50 MG tablet Commonly known as: COZAAR Take 1 tablet (50 mg total) by mouth daily.   MEDI-PATCH-LIDOCAINE EX Apply 1 patch topically 2 (two) times daily. Applied to back   metoprolol succinate 25 MG 24 hr tablet Commonly known as: TOPROL-XL Take 12.5 mg by mouth daily.   polyethylene glycol 17 g packet Commonly known as: MIRALAX / GLYCOLAX Take 17 g by mouth daily.   Potassium 99 MG Tabs Take 99 mg by mouth daily.   QC Vitamin D3 50 MCG (2000 UT) Tabs Generic drug: Cholecalciferol Take 2,000 Units by mouth daily.   Turmeric 500 MG Tabs Take 500 mg by mouth at bedtime. w/Ginger   vitamin B-12 1000 MCG tablet Commonly known as: CYANOCOBALAMIN Take 1,000 mcg by mouth daily.       Follow-up Information  [facility] Follow up on 05/16/2020.   Specialty: Cardiology Why: at 11:30am. Enter through the [place] entrance Contact information: [address] [place]             Allergies  Allergen Reactions  . Levofloxacin Other (See Comments)    Taste alterations for about 1 month after taking med    Consultations:  Cardiology   Procedures/Studies: DG Chest 2 View  Result Date: 05/02/2020 CLINICAL DATA:  87 year old male with history of shortness  of breath and tachycardia. EXAM: CHEST - 2 VIEW COMPARISON:  Chest x-ray 03/28/2020. FINDINGS: Lung volumes are low. Widespread patchy areas of ill-defined opacity and interstitial prominence noted throughout the lungs bilaterally. Small bilateral pleural effusions. Pulmonary vasculature is largely obscured. Heart size is normal. The patient is rotated to the right on today's exam, resulting in distortion of the mediastinal contours and reduced diagnostic sensitivity and specificity for mediastinal pathology. Aortic atherosclerosis. IMPRESSION: 1. The appearance the chest could suggest congestive heart failure, however, given the lack of cardiac enlargement, this is unlikely to reflect systolic congestive heart failure but could represent diastolic failure. Alternatively, this could represent multilobar bilateral pneumonia from atypical infection such as viral infection. Further clinical evaluation is recommended. 2. Aortic atherosclerosis. Electronically Signed   By: Vinnie Langton M.D.   On: 05/02/2020 13:44   CT ABDOMEN PELVIS W CONTRAST  Result Date: 05/02/2020 CLINICAL DATA:  Abdominal pain. EXAM: CT ABDOMEN AND PELVIS WITH CONTRAST TECHNIQUE: Multidetector CT imaging of the abdomen and pelvis was performed using the standard protocol following bolus administration of intravenous contrast. CONTRAST:  187mL OMNIPAQUE IOHEXOL 300 MG/ML  SOLN COMPARISON:  CT 03/28/2020 FINDINGS: Lower chest: Mild ground-glass opacity at the lung bases. Moderate bilateral pleural effusions. No significant change from prior. Hepatobiliary: Postcholecystectomy. There is mild intrahepatic and moderate extrahepatic biliary duct dilatation. The common hepatic duct measures 14 mm. The common bile duct measures 11 mm. These findings are slightly increased from comparison exam (hepatic duct measuring 12 mm and common bile duct measuring 10 mm). Cystic duct remnant is also noted. No obstructing lesion identified. Pancreas: Mild  prominence of the pancreatic duct similar prior. No pancreatic inflammation. Spleen: Wedge-shaped defect in the spleen likely rest remote infarction. Similar prior. Adrenals/urinary tract: Adrenal glands normal. Bilateral mixed density renal cysts are again noted. Largest cyst measuring 2.4 cm in the LEFT kidney with high attenuation which could indicate enhancement. No renal obstruction. Ureters and bladder normal. Stomach/Bowel: Stomach, duodenum small-bowel normal. Appendix not identified. Ascending, transverse and descending colon normal. Normal stool volume. Multiple diverticula of the sigmoid colon. No acute inflammation. Rectum Vascular/Lymphatic: Abdominal aorta is normal caliber with atherosclerotic calcification. There is no retroperitoneal or periportal lymphadenopathy. No pelvic lymphadenopathy. Reproductive: Post prostatectomy Other: No free fluid or abscess in the abdomen pelvis. Musculoskeletal: Multiple chronic compression fractures of the lumbar spine. No acute findings. IMPRESSION: 1. Bilateral moderate pleural effusions and mild pulmonary edema pattern not changed from comparison exam. 2. Chronic dilatation of the extrahepatic bile ducts similar to comparison exam. 3. Mild dilatation of the pancreatic duct similar to prior. No evidence of pancreatic mass or inflammation. 4. Stable bilateral renal cyst of mixed density. 5. Probable peripheral infarction in the spleen similar to prior. 6. Diverticulosis of the LEFT colon without acute inflammation. Electronically Signed   By: Suzy Bouchard M.D.   On: 05/02/2020 15:38       Subjective: Sleeping.  Family  reports that he is having no further dyspnea, respiratory distress.  They have observed no complaint complaints.  Evaluated by PT and appeared to function close to baseline without symptoms.  Discharge Exam: Vitals:   05/03/20 1122 05/03/20 1522  BP: (!) 119/58 125/60  Pulse: 79 77  Resp: 16 16  Temp: 98.6 F (37 C) 98.2 F (36.8  C)  SpO2: 94% 95%   Vitals:   05/03/20 0809 05/03/20 0936 05/03/20 1122 05/03/20 1522  BP: 119/68 138/72 (!) 119/58 125/60  Pulse: 82 88 79 77  Resp: (!) 26 16 16 16   Temp: 97.9 F (36.6 C) 98.5 F (36.9 C) 98.6 F (37 C) 98.2 F (36.8 C)  TempSrc: Oral Oral    SpO2: 95% 95% 94% 95%  Weight:      Height:        General: Pt is sleeping, rouses somewhat, then goes back to sleep not in acute distress Cardiovascular: Normal rate, irregular, nl S1-S2, no murmurs appreciated.   No LE edema.   Respiratory: Normal respiratory rate and rhythm.  CTAB without rales or wheezes. Abdominal: Abdomen soft without rigidity or rebound.  No distension or HSM.       The results of significant diagnostics from this hospitalization (including imaging, microbiology, ancillary and laboratory) are listed below for reference.     Microbiology: Recent Results (from the past 240 hour(s))  SARS CORONAVIRUS 2 (TAT 6-24 HRS) Nasopharyngeal Nasopharyngeal Swab     Status: None   Collection Time: 05/02/20  2:08 PM   Specimen: Nasopharyngeal Swab  Result Value Ref Range Status   SARS Coronavirus 2 NEGATIVE NEGATIVE Final    Comment: (NOTE) SARS-CoV-2 target nucleic acids are NOT DETECTED.  The SARS-CoV-2 RNA is generally detectable in upper and lower respiratory specimens during the acute phase of infection. Negative results do not preclude SARS-CoV-2 infection, do not rule out co-infections with other pathogens, and should not be used as the sole basis for treatment or other patient management decisions. Negative results must be combined with clinical observations, patient history, and epidemiological information. The expected result is Negative.  Fact Sheet for Patients: SugarRoll.be  Fact Sheet for Healthcare Providers: https://www.woods-mathews.com/  This test is not yet approved or cleared by the [place] FDA and  has been authorized for  detection and/or diagnosis of SARS-CoV-2 by FDA under an Emergency Use Authorization (EUA). This EUA will remain  in effect (meaning this test can be used) for the duration of the COVID-19 declaration under Se ction 564(b)(1) of the Act, 21 U.S.C. section 360bbb-3(b)(1), unless the authorization is terminated or revoked sooner.  Performed at [facility], [place] [address] [place], [place] [address]      Labs: BNP (last 3 results) Recent Labs    03/28/20 0914 05/02/20 1302  BNP 274.1* 010.9*   Basic Metabolic Panel: Recent Labs  Lab 05/02/20 1302 05/02/20 1305 05/03/20 0427  NA 142  --  144  K 3.5  --  3.1*  CL 108  --  108  CO2 24  --  25  GLUCOSE 113*  --  99  BUN 31*  --  28*  CREATININE 1.07  --  0.93  CALCIUM 9.6  --  9.6  MG  --  2.0  --    Liver Function Tests: No results for input(s): AST, ALT, ALKPHOS, BILITOT, PROT, ALBUMIN in the last 168 hours. No results for input(s): LIPASE, AMYLASE in the last 168 hours. No results for input(s): AMMONIA  in the last 168 hours. CBC: Recent Labs  Lab 05/02/20 1302  WBC 8.8  HGB 9.6*  HCT 30.8*  MCV 94.2  PLT 372   Cardiac Enzymes: No results for input(s): CKTOTAL, CKMB, CKMBINDEX, TROPONINI in the last 168 hours. BNP: Invalid input(s): POCBNP CBG: No results for input(s): GLUCAP in the last 168 hours. D-Dimer No results for input(s): DDIMER in the last 72 hours. Hgb A1c No results for input(s): HGBA1C in the last 72 hours. Lipid Profile Recent Labs    05/03/20 0427  CHOL 112  HDL 35*  LDLCALC 58  TRIG 95  CHOLHDL 3.2   Thyroid function studies Recent Labs    05/03/20 0427  TSH 2.335   Anemia work up No results for input(s): VITAMINB12, FOLATE, FERRITIN, TIBC, IRON, RETICCTPCT in the last 72 hours. Urinalysis    Component Value Date/Time   COLORURINE YELLOW (A) 05/02/2020 1352   APPEARANCEUR HAZY (A) 05/02/2020 1352   APPEARANCEUR Clear 05/05/2013 1500   LABSPEC 1.020 05/02/2020 1352    LABSPEC 1.014 05/05/2013 1500   PHURINE 6.0 05/02/2020 1352   GLUCOSEU NEGATIVE 05/02/2020 1352   GLUCOSEU Negative 05/05/2013 1500   HGBUR NEGATIVE 05/02/2020 1352   BILIRUBINUR NEGATIVE 05/02/2020 1352   BILIRUBINUR Negative 05/05/2013 1500   KETONESUR NEGATIVE 05/02/2020 1352   PROTEINUR NEGATIVE 05/02/2020 1352   NITRITE NEGATIVE 05/02/2020 1352   LEUKOCYTESUR NEGATIVE 05/02/2020 1352   LEUKOCYTESUR Negative 05/05/2013 1500   Sepsis Labs Invalid input(s): PROCALCITONIN,  WBC,  LACTICIDVEN Microbiology Recent Results (from the past 240 hour(s))  SARS CORONAVIRUS 2 (TAT 6-24 HRS) Nasopharyngeal Nasopharyngeal Swab     Status: None   Collection Time: 05/02/20  2:08 PM   Specimen: Nasopharyngeal Swab  Result Value Ref Range Status   SARS Coronavirus 2 NEGATIVE NEGATIVE Final    Comment: (NOTE) SARS-CoV-2 target nucleic acids are NOT DETECTED.  The SARS-CoV-2 RNA is generally detectable in upper and lower respiratory specimens during the acute phase of infection. Negative results do not preclude SARS-CoV-2 infection, do not rule out co-infections with other pathogens, and should not be used as the sole basis for treatment or other patient management decisions. Negative results must be combined with clinical observations, patient history, and epidemiological information. The expected result is Negative.  Fact Sheet for Patients: SugarRoll.be  Fact Sheet for Healthcare Providers: https://www.woods-mathews.com/  This test is not yet approved or cleared by the [place] FDA and  has been authorized for detection and/or diagnosis of SARS-CoV-2 by FDA under an Emergency Use Authorization (EUA). This EUA will remain  in effect (meaning this test can be used) for the duration of the COVID-19 declaration under Se ction 564(b)(1) of the Act, 21 U.S.C. section 360bbb-3(b)(1), unless the authorization is terminated or revoked  sooner.  Performed at [facility], [place] [address] [place], [place] [address]      Time coordinating discharge: 25  minutes      SIGNED:   Edwin Dada, MD  [facility] 05/03/2020, 6:04 PM

## 2020-05-03 NOTE — Care Management Obs Status (Signed)
[facility] NOTIFICATION   Patient Details  Name: Gerald [name] MRN: [id number] Date of Birth: [birth year]   Medicare Observation Status Notification Given:       Kerin Salen, RN 05/03/2020, 2:04 PM

## 2020-05-03 NOTE — Evaluation (Signed)
Occupational Therapy Evaluation Patient Details Name: Gerald [name] MRN: [id number] DOB: 07-17-1920 Today's Date: 05/03/2020    History of Present Illness 87 y.o. male with medical history significant of dCHF, hypertension, stroke, atrial fibrillation not on anticoagulants, prostate cancer, testicular cancer, hemochromatosis, MDS, anemia, dementia, who presents with shortness of breath.   Clinical Impression   OT evaluation initiated and completed this session. Pt supine in bed with daughter present in room. Family reports pt has not ambulated for several months. They have been using a sarah stedy and manual hoyer lift for transfers at home. Self care being performed with total A. Pt needing total A of 2 for bed mobility and sit <>stand for safety. Pt was able to maintain static sitting balance on EOB for several minutes with min guard - mod A as pt fatigues.  Pt appears at baseline for self care and functional transfers. No skilled occupational therapy need at this time. OT to SIGN OFF. Thank you for this referral.    Follow Up Recommendations  No OT follow up;Supervision/Assistance - 24 hour    Equipment Recommendations  None recommended by OT;Other (comment) (family is interested in ramp)       Precautions / Restrictions Precautions Precautions: Fall      Mobility Bed Mobility Overal bed mobility: Needs Assistance Bed Mobility: Rolling;Supine to Sit;Sit to Supine Rolling: +2 for physical assistance;Total assist   Supine to sit: +2 for physical assistance;Total assist Sit to supine: +2 for physical assistance;Total assist        Transfers Overall transfer level: Needs assistance Equipment used: Rolling walker (2 wheeled) Transfers: Sit to/from Stand Sit to Stand: +2 physical assistance;Total assist              Balance Overall balance assessment: Needs assistance Sitting-balance support: Feet supported Sitting balance-Leahy Scale: Fair Sitting balance -  Comments: varied level of assist from min guard - mod A (when fatigued)     Standing balance-Leahy Scale: Zero Standing balance comment: +2 assistance                           ADL either performed or assessed with clinical judgement   ADL Overall ADL's : At baseline                                       General ADL Comments: dependent at baseline per family report     Vision Patient Visual Report: No change from baseline              Pertinent Vitals/Pain Pain Assessment: Faces Faces Pain Scale: Hurts little more Pain Location: L LE with bed mobility Pain Descriptors / Indicators: Grimacing;Discomfort Pain Intervention(s): Limited activity within patient's tolerance;Monitored during session;Repositioned     Hand Dominance Right   Extremity/Trunk Assessment Upper Extremity Assessment Upper Extremity Assessment: Generalized weakness   Lower Extremity Assessment Lower Extremity Assessment: Defer to PT evaluation       Communication Communication Communication: No difficulties   Cognition Arousal/Alertness: Lethargic Behavior During Therapy: Flat affect Overall Cognitive Status: History of cognitive impairments - at baseline                                 General Comments: cognition appeared to be at baseline. Pt has had prior CVA , follows commands with  increased time, and was pleasant throughout. Pt unable to verbalize location, situation, or time when asked.              Home Living Family/patient expects to be discharged to:: Private residence Living Arrangements: Children Available Help at Discharge: Family Type of Home: House Home Access: Stairs to enter Technical brewer of Steps: 4 Entrance Stairs-Rails: None Home Layout: Multi-level;Bed/bath upstairs;Able to live on main level with bedroom/bathroom               Home Equipment: Gilford Rile - 2 wheels;Shower seat;Wheelchair - [employer];Other (comment);Hospital bed   Additional Comments: adjustable bed; lift chair; STS lift ; manual hoyer lift      Prior Functioning/Environment Level of Independence: Needs assistance  Gait / Transfers Assistance Needed: Pt has not ambulated in several months. Family uses sarah stedy (sit to stand) with use of gait belt and hoyer lift for transfers. ADL's / Homemaking Assistance Needed: Pt is currently total A for all aspects of self care per family report                     OT Goals(Current goals can be found in the care plan section) Acute Rehab OT Goals Patient Stated Goal: family wants family to "get stronger with sitting balance" and " Be able to stand or walk again" OT Goal Formulation: With patient/family Time For Goal Achievement: 05/17/20 Potential to Achieve Goals: Fair  OT Frequency:             Co-evaluation PT/OT/SLP Co-Evaluation/Treatment: Yes Reason for Co-Treatment: Complexity of the patient's impairments (multi-system involvement);For patient/therapist safety;To address functional/ADL transfers PT goals addressed during session: Mobility/safety with mobility;Balance OT goals addressed during session: ADL's and self-care;Proper use of [employer] equipment and DME      AM-PAC OT "6 Clicks" Daily Activity     Outcome Measure Help from another person eating meals?: A Lot Help from another person taking care of personal grooming?: A Lot Help from another person toileting, which includes using toliet, bedpan, or urinal?: Total Help from another person bathing (including washing, rinsing, drying)?: Total Help from another person to put on and taking off regular upper body clothing?: A Lot Help from another person to put on and taking off regular lower body clothing?: Total 6 Click Score: 9   End of Session Equipment Utilized During Treatment: Rolling walker;Oxygen Nurse Communication: Mobility status (recommended lift equipment to transfer into recliner  chair)  Activity Tolerance: Patient tolerated treatment well Patient left: in bed;with call bell/phone within reach;with bed alarm set;with family/visitor present                   Time: 3154-0086 OT Time Calculation (min): 25 min Charges:  OT General Charges $OT Visit: 1 Visit OT Evaluation $OT Eval Moderate Complexity: 1 [address] [place], OTR/L , CBIS ascom [phone]  05/03/20, 12:37 PM

## 2020-05-03 NOTE — Progress Notes (Signed)
K is 3.1. Felizardo Hoffmann NP made aware. No new orders at this time.

## 2020-05-03 NOTE — Progress Notes (Signed)
Discharge instructions reviewed with Daughter [name] at bedside and placed in packet afterwards. All questions answered and no additional needs at time of discharge. EMS arrived to take patient home, vitals WDL at time of discharge and patient not in any distress.

## 2020-05-03 NOTE — TOC Transition Note (Signed)
Transition of Care [facility]) - CM/SW Discharge Note   Patient Details  Name: Gerald [name] MRN: [id number] Date of Birth: [birth year]  [facility] CM/SW Contact:  Kerin Salen, RN Phone Number: 05/03/2020, 2:16 PM   Clinical Narrative:  Patient to be discharged to home with family care 24/7. Will resume Home Health services with [facility], [place] will continue DME orders. No other TOC needs assessed at this time.     Final next level of care: [place] Barriers to Discharge: Barriers Resolved   Patient Goals and CMS Choice Patient states their goals for this hospitalization and ongoing recovery are:: Family wants patient to return home.   Choice offered to / list presented to : NA  Discharge Placement                Patient to be transferred to facility by: [facility] Name of family member notified: Daughter in room. Patient and family notified of of transfer: 05/03/20  Discharge Plan and Services                DME Arranged: Other see comment,Oxygen ([facility]) DME Agency: [place]: Other - See comment ([facility]) Date HH Agency Contacted: 05/03/20 Time HH Agency Contacted: [phone]    Social Determinants of Health (SDOH) Interventions     Readmission Risk Interventions No flowsheet data found.

## 2020-05-03 NOTE — Care Management CC44 (Signed)
Condition Code 44 Documentation Completed  Patient Details  Name: STANLY SI MRN: [id number] Date of Birth: Aug 13, 1920   Condition Code 44 given:    Patient signature on Condition Code 44 notice:    Documentation of 2 MD's agreement:    Code 44 added to claim:       Kerin Salen, RN 05/03/2020, 2:05 PM

## 2020-05-03 NOTE — Evaluation (Signed)
Physical Therapy Evaluation Patient Details Name: Gerald [name] MRN: [id number] DOB: [birth year] Today's Date: 05/03/2020   History of Present Illness  87 y.o. male with medical history significant of dCHF, hypertension, stroke, atrial fibrillation not on anticoagulants, prostate cancer, testicular cancer, hemochromatosis, MDS, anemia, dementia, who presents with shortness of breath.    Clinical Impression  Pt is a 87 year old M admitted to hospital on 05/02/20 for acute on chronic diastolic congestive heart failure. Due to baseline dementia, PLOF obtained from daughter at bedside. At baseline, pt required assistance via lifts to transfer in/out of bed, and was total assist for other ADL's; pt has not ambulated in several months. Pt presents with generalized weakness, baseline confusion, decreased activity tolerance, and decreased balance resulting in impaired functional mobility. Due to deficits, pt required total assist +2 for bed mobility and transfers. Pt able to demonstrate fair seated balance at EOB with intermittent reaching outside BOS, that progressed to poor with fatigue. Pt currently functioning close to baseline, however, does show good effort with seated balance at EOB. Therefore will benefit from acute PT services for maintenance and progression of proximal stability to decrease caregiver burden. At this time, PT recommends DC home with family and 24/7 care, as well as HHPT services to address deficits to decrease caregiver burden. Daughter expresses interest in ramp at home, as they have 4 STE without railing, and currently use EMS for transport when needed.     Follow Up Recommendations Home health PT;Supervision/Assistance - 24 hour    Equipment Recommendations  None recommended by PT    Recommendations for Other Services       Precautions / Restrictions Precautions Precautions: Fall Precaution Comments: DNR Restrictions Weight Bearing Restrictions: No       Mobility  Bed Mobility Overal bed mobility: Needs Assistance Bed Mobility: Rolling;Supine to Sit;Sit to Supine Rolling: +2 for physical assistance;Total assist   Supine to sit: +2 for physical assistance;Total assist Sit to supine: +2 for physical assistance;Total assist   General bed mobility comments: Total assist +2 for all bed mobility. Verbal cues for sequencing.    Transfers Overall transfer level: Needs assistance Equipment used: Rolling walker (2 wheeled) Transfers: Sit to/from Stand Sit to Stand: +2 physical assistance;Total assist         General transfer comment: Total assist +2 to stand from EOB with RW. Pt unable to achieve full standing, demonstrating increased hip flexion and BLE forward outside BOS  Ambulation/Gait                   Balance Overall balance assessment: Needs assistance Sitting-balance support: Feet supported Sitting balance-Leahy Scale: Fair Sitting balance - Comments: varied level of assist from min guard - mod A (when fatigued). When fatigued, pt demonstrated L posterolateral lean Postural control: Left lateral lean;Posterior lean Standing balance support: Bilateral upper extremity supported;During functional activity Standing balance-Leahy Scale: Zero Standing balance comment: +2 assistance with RW, unable to achieve full standing                             Pertinent Vitals/Pain Pain Assessment: Faces Faces Pain Scale: Hurts little more Pain Location: L LE with bed mobility Pain Descriptors / Indicators: Grimacing;Discomfort Pain Intervention(s): Limited activity within patient's tolerance;Monitored during session;Repositioned    Home Living Family/patient expects to be discharged to:: Private residence Living Arrangements: Children Available Help at Discharge: Family;Available 24 hours/day Type of Home: House Home Access: Stairs to enter  Entrance Stairs-Rails: None Entrance Stairs-Number of Steps: 4 Home  Layout: Multi-level;Bed/bath upstairs;Able to live on main level with bedroom/bathroom Home Equipment: Gilford Rile - 2 wheels;Shower seat;Wheelchair - [employer];Other (comment);Hospital bed Additional Comments: adjustable bed; lift chair; STS lift ; manual hoyer lift    Prior Function Level of Independence: Needs assistance   Gait / Transfers Assistance Needed: Pt has not ambulated in several months. Family uses sarah stedy (sit to stand) with use of gait belt and hoyer lift for transfers. Pt intermittently able to sit EOB/toilet with support/supervision.  ADL's / Homemaking Assistance Needed: Pt is currently total A for all aspects of self care per family report        Hand Dominance   Dominant Hand: Right    Extremity/Trunk Assessment   Upper Extremity Assessment Upper Extremity Assessment: Defer to OT evaluation    Lower Extremity Assessment Lower Extremity Assessment: Generalized weakness    Cervical / Trunk Assessment Cervical / Trunk Assessment: Kyphotic  Communication   Communication: No difficulties  Cognition Arousal/Alertness: Lethargic Behavior During Therapy: Flat affect Overall Cognitive Status: History of cognitive impairments - at baseline                                 General Comments: cognition appeared to be at baseline. Pt has had prior CVA , follows commands with increased time, and was pleasant throughout. Pt unable to verbalize location, situation, or time when asked.      General Comments      Exercises Other Exercises Other Exercises: Pt able to participate in bed mobility and transfers with total assist +2, however, pt demonstrated initial fair seated balance at EOB that progressed to poor with fatigue. Pt able to perform multiplanar reaching outside BOS with BUE, and min guard for safety. Other Exercises: Pt and daughter educated regarding: PT role/POC, DC recommendations, safety with mobility, prognosis. Daughter  verbalized understanding.   Assessment/Plan    PT Assessment Patient needs continued PT services  PT Problem List Decreased strength;Decreased safety awareness;Decreased activity tolerance;Decreased cognition;Decreased balance;Pain       PT Treatment Interventions Functional mobility training;Therapeutic activities;Therapeutic exercise;Balance training;Neuromuscular re-education    PT Goals (Current goals can be found in the Care Plan section)  Acute Rehab PT Goals Patient Stated Goal: family wants family to "get stronger with sitting balance" and " Be able to stand or walk again" PT Goal Formulation: With patient/family Time For Goal Achievement: 05/17/20 Potential to Achieve Goals: Fair    Frequency Min 2X/week        Co-evaluation   Reason for Co-Treatment: Complexity of the patient's impairments (multi-system involvement);For patient/therapist safety;To address functional/ADL transfers PT goals addressed during session: Mobility/safety with mobility;Balance OT goals addressed during session: ADL's and self-care;Proper use of Adaptive equipment and DME       AM-PAC PT "6 Clicks" Mobility  Outcome Measure Help needed turning from your back to your side while in a flat bed without using bedrails?: Total Help needed moving from lying on your back to sitting on the side of a flat bed without using bedrails?: Total Help needed moving to and from a bed to a chair (including a wheelchair)?: Total Help needed standing up from a chair using your arms (e.g., wheelchair or bedside chair)?: Total Help needed to walk in hospital room?: Total Help needed climbing 3-5 steps with a railing? : Total 6 Click Score: 6    End of Session Equipment  Utilized During Treatment: Gait belt Activity Tolerance: Patient limited by fatigue Patient left: in bed;with call bell/phone within reach;with bed alarm set;with family/visitor present Nurse Communication: Mobility status PT Visit Diagnosis:  Unsteadiness on feet (R26.81);Muscle weakness (generalized) (M62.81);Pain Pain - Right/Left: Left Pain - part of body: Knee;Leg    Time: 3888-2800 PT Time Calculation (min) (ACUTE ONLY): 21 min   Charges:   PT Evaluation $PT Eval Moderate Complexity: 1 Mod          Herminio Commons, PT, DPT 1:08 PM,05/03/20

## 2020-05-03 NOTE — Progress Notes (Signed)
Patient's daughter Gerald [name] at bedside. Gerald [name] updated along with patient on plan on care and discharge reviews with MD as well as Case Management. Gerald [name] has contact info to reach [facility] at time of discharge to arrange EMS transportation home. All family members assist with patient's care at home.

## 2020-05-03 NOTE — Consult Note (Addendum)
CARDIOLOGY CONSULT NOTE               Patient ID: YAIR DUSZA MRN: [id number] DOB/AGE: [birth year] 87 y.o.  Admit date: 05/02/2020 Referring Physician Blaine Hamper Primary Physician [facility] Primary Cardiologist Paras[place] Reason for Consultation acute on chronic CHF, atrial fibrillation with RVR  HPI: 87 year old gentleman referred for evaluation of acute on chronic diastolic CHF and atrial fibrillation with RVR. The patient has known history of paroxysmal atrial fibrillation, not on chronic anticoagulation per patient's wishes, as well as high falls risk, hypertension, MDS, history of CVA in 03/2020, and dementia. The patient has had an approximate 1 week decline, including progressive shortness of breath, tachycardia, abdominal distention and lower extremity edema. The patient has been receiving OT, PT and nursing care at home. The patient was able to participate in bed physical therapy activities; however, in that last week or so, the patient has been too tachypneic and tachycardic to qualify for physical therapy. The patient's daughter called the on-call cardiologist, Dr. Ubaldo Glassing at the time, in regards to the patient's elevated heart rate, and metoprolol succinate 12.5 mg daily was prescribed about 5 days ago. The patient was seen in the cardiologist office yesterday, tachypneic and tachycardic, and appeared to be fluid-overloaded. The patient was taken to [facility] ER for further management of his rate and CHF exacerbation. The patient was started on diltiazem drip and IV Lasix. Admission labs notable for high sensitivity troponin 19 and 19, BNP 319 (up from 274 a month prior), stable hemoglobin of 9.6, hematocrit 30.8, potassium 3.1, creatinine 0.93, BUN 28. CT abdomen/pelvic showed bilateral moderate pleural effusions and mild pulmonary edema. Chest xray showed small bilateral pleural effusion. ECG revealed atrial fibrillation at a rate of 105 bpm without evidence of ischemia. The patient's daughter  at the bedside states that she notices a big improvement in the patient's breathing and swelling since he received IV Lasix. The patient is not able to contribute to the history due to dementia.     Past Medical History:  Diagnosis Date  . Anemia   . Arthritis   . Atrial fibrillation ([place])   . Cancer [facility])    Testicular  . Hemochromatosis 10/25/2014  . Hx of basal cell carcinoma 2008   R. ant. zygomatic/sideburn 2008, L preauricular 2019  . Hypertension   . Prostate cancer [facility])     Past Surgical History:  Procedure Laterality Date  . ANTERIOR APPROACH HEMI HIP ARTHROPLASTY Left 05/28/2019   Procedure: ANTERIOR APPROACH HEMI HIP ARTHROPLASTY;  Surgeon: Hessie Knows, MD;  Location: [facility] ORS;  Service: Orthopedics;  Laterality: Left;  . APPENDECTOMY    . CHOLECYSTECTOMY    . HAND SURGERY Bilateral   . HERNIA REPAIR    . INNER EAR SURGERY    . KYPHOPLASTY N/A 12/14/2015   Procedure: KYPHOPLASTY  L2;  Surgeon: Hessie Knows, MD;  Location: [facility] ORS;  Service: Orthopedics;  Laterality: N/A;  . KYPHOPLASTY N/A 12/23/2019   Procedure: KYPHOPLASTY;  Surgeon: Hessie Knows, MD;  Location: [facility] ORS;  Service: Orthopedics;  Laterality: N/A;  . PROSTATE SURGERY    . SURGERY SCROTAL / TESTICULAR Right     Medications Prior to Admission  Medication Sig Dispense Refill Last Dose  . acetaminophen (TYLENOL) 650 MG CR tablet Take 650 mg by mouth 2 (two) times daily.   prn at prn  . aspirin EC 81 MG tablet Take 81 mg by mouth every evening.    05/01/2020 at Unknown time  . Calcium-Magnesium-Zinc (CAL-MAG-ZINC PO)  Take 1 tablet by mouth daily.   05/02/2020 at 0900  . Cholecalciferol (QC VITAMIN D3) 50 MCG (2000 UT) TABS Take 2,000 Units by mouth daily.   05/02/2020 at 0900  . docusate sodium (COLACE) 100 MG capsule Take 100 mg by mouth every evening.   05/01/2020 at Unknown time  . ELDERBERRY PO Take 2 tablets by mouth daily. Gummy   05/02/2020 at 0900  . ibuprofen (ADVIL) 200 MG tablet Take 200 mg by  mouth 2 (two) times daily.   05/02/2020 at 0900  . Lido-Capsaicin-Men-Methyl Sal (MEDI-PATCH-LIDOCAINE EX) Apply 1 patch topically 2 (two) times daily. Applied to back   05/02/2020 at 0900  . losartan (COZAAR) 50 MG tablet Take 1 tablet (50 mg total) by mouth daily. 30 tablet 1 05/02/2020 at Unknown time  . metoprolol succinate (TOPROL-XL) 25 MG 24 hr tablet Take 12.5 mg by mouth daily.   05/02/2020 at Unknown time  . polyethylene glycol (MIRALAX / GLYCOLAX) 17 g packet Take 17 g by mouth daily.   05/02/2020 at 0900  . Potassium 99 MG TABS Take 99 mg by mouth daily.    05/02/2020 at 0900  . Turmeric 500 MG TABS Take 500 mg by mouth at bedtime. w/Ginger   05/01/2020 at Unknown time  . vitamin B-12 (CYANOCOBALAMIN) 1000 MCG tablet Take 1,000 mcg by mouth daily.   05/02/2020 at 0900   Social History   Socioeconomic History  . Marital status: Widowed    Spouse name: Not on file  . Number of children: Not on file  . Years of education: Not on file  . Highest education level: Not on file  Occupational History  . Not on file  Tobacco Use  . Smoking status: Former Smoker    Packs/day: 1.00    Years: 25.00    Pack years: 25.00    Types: Cigarettes    Quit date: 03/22/1958    Years since quitting: 62.1  . Smokeless tobacco: Never Used  Vaping Use  . Vaping Use: Never used  Substance and Sexual Activity  . Alcohol use: No  . Drug use: No  . Sexual activity: Never  Other Topics Concern  . Not on file  Social History Narrative  . Not on file   Social Determinants of Health   Financial Resource Strain: Not on file  Food Insecurity: Not on file  Transportation Needs: Not on file  Physical Activity: Not on file  Stress: Not on file  Social Connections: Not on file  Intimate Partner Violence: Not on file    Family History  Problem Relation Age of Onset  . Hypertension Mother       Review of systems complete and found to be negative unless listed above      PHYSICAL EXAM  General:  Frail, elderly gentleman, resting in bed, in no acute distress HEENT:  Normocephalic and atramatic Neck:  No JVD.  Lungs: Clear bilaterally to auscultation, mouth breathing Heart: irregularly irregalar without gallops or murmurs.  Msk:  Thin, frail, no obvious deformities Extremities: No clubbing, cyanosis or edema.   Neuro: somnolent, easily aroused with voice and touch   Labs:   Lab Results  Component Value Date   WBC 8.8 05/02/2020   HGB 9.6 (L) 05/02/2020   HCT 30.8 (L) 05/02/2020   MCV 94.2 05/02/2020   PLT 372 05/02/2020    Recent Labs  Lab 05/03/20 0427  NA 144  K 3.1*  CL 108  CO2 25  BUN 28*  CREATININE 0.93  CALCIUM 9.6  GLUCOSE 99   Lab Results  Component Value Date   CKMB 1.3 05/06/2013   TROPONINI < 0.02 05/06/2013    Lab Results  Component Value Date   CHOL 112 05/03/2020   Lab Results  Component Value Date   HDL 35 (L) 05/03/2020   Lab Results  Component Value Date   LDLCALC 58 05/03/2020   Lab Results  Component Value Date   TRIG 95 05/03/2020   Lab Results  Component Value Date   CHOLHDL 3.2 05/03/2020   No results found for: LDLDIRECT    Radiology: DG Chest 2 View  Result Date: 05/02/2020 CLINICAL DATA:  87 year old male with history of shortness of breath and tachycardia. EXAM: CHEST - 2 VIEW COMPARISON:  Chest x-ray 03/28/2020. FINDINGS: Lung volumes are low. Widespread patchy areas of ill-defined opacity and interstitial prominence noted throughout the lungs bilaterally. Small bilateral pleural effusions. Pulmonary vasculature is largely obscured. Heart size is normal. The patient is rotated to the right on today's exam, resulting in distortion of the mediastinal contours and reduced diagnostic sensitivity and specificity for mediastinal pathology. Aortic atherosclerosis. IMPRESSION: 1. The appearance the chest could suggest congestive heart failure, however, given the lack of cardiac enlargement, this is unlikely to reflect systolic  congestive heart failure but could represent diastolic failure. Alternatively, this could represent multilobar bilateral pneumonia from atypical infection such as viral infection. Further clinical evaluation is recommended. 2. Aortic atherosclerosis. Electronically Signed   By: Vinnie Langton M.D.   On: 05/02/2020 13:44   CT ABDOMEN PELVIS W CONTRAST  Result Date: 05/02/2020 CLINICAL DATA:  Abdominal pain. EXAM: CT ABDOMEN AND PELVIS WITH CONTRAST TECHNIQUE: Multidetector CT imaging of the abdomen and pelvis was performed using the standard protocol following bolus administration of intravenous contrast. CONTRAST:  157mL OMNIPAQUE IOHEXOL 300 MG/ML  SOLN COMPARISON:  CT 03/28/2020 FINDINGS: Lower chest: Mild ground-glass opacity at the lung bases. Moderate bilateral pleural effusions. No significant change from prior. Hepatobiliary: Postcholecystectomy. There is mild intrahepatic and moderate extrahepatic biliary duct dilatation. The common hepatic duct measures 14 mm. The common bile duct measures 11 mm. These findings are slightly increased from comparison exam (hepatic duct measuring 12 mm and common bile duct measuring 10 mm). Cystic duct remnant is also noted. No obstructing lesion identified. Pancreas: Mild prominence of the pancreatic duct similar prior. No pancreatic inflammation. Spleen: Wedge-shaped defect in the spleen likely rest remote infarction. Similar prior. Adrenals/urinary tract: Adrenal glands normal. Bilateral mixed density renal cysts are again noted. Largest cyst measuring 2.4 cm in the LEFT kidney with high attenuation which could indicate enhancement. No renal obstruction. Ureters and bladder normal. Stomach/Bowel: Stomach, duodenum small-bowel normal. Appendix not identified. Ascending, transverse and descending colon normal. Normal stool volume. Multiple diverticula of the sigmoid colon. No acute inflammation. Rectum Vascular/Lymphatic: Abdominal aorta is normal caliber with  atherosclerotic calcification. There is no retroperitoneal or periportal lymphadenopathy. No pelvic lymphadenopathy. Reproductive: Post prostatectomy Other: No free fluid or abscess in the abdomen pelvis. Musculoskeletal: Multiple chronic compression fractures of the lumbar spine. No acute findings. IMPRESSION: 1. Bilateral moderate pleural effusions and mild pulmonary edema pattern not changed from comparison exam. 2. Chronic dilatation of the extrahepatic bile ducts similar to comparison exam. 3. Mild dilatation of the pancreatic duct similar to prior. No evidence of pancreatic mass or inflammation. 4. Stable bilateral renal cyst of mixed density. 5. Probable peripheral infarction in the spleen similar to prior. 6. Diverticulosis of the LEFT colon without acute  inflammation. Electronically Signed   By: Suzy Bouchard M.D.   On: 05/02/2020 15:38    EKG: atrial fibrillation with RVR  ASSESSMENT AND PLAN:  1. Acute on chronic diastolic CHF, chest xray consistent with CHF, BNP elevated to 319, high sensitivity troponin 19 and 19, on IV Lasix. LVEF 50-55% per echo 03/28/2020. 2. Atrial fibrillation with RVR, currently on diltiazem drip and metoprolol succinate 12.5 mg, and aspirin. Heart rate is now controlled. Patient declines anticoagulation and is a high falls risk 3. Hypertension, mildly elevated this morning  Recommendations: 1.Switch from Cardizem drip to Cardizem CD 120 mg daily; continue metoprolol succinate 12.5 mg for rate control 2. Continue aspirin and statin for stroke prevention 3. Recommend discharging with Lasix 20 mg daily 4. Recommend holding losartan as blood pressure is now lower and two new BP medications are being adding. Discontinue home hydralazine  5. Follow up via telemedicine in 1 week with Dr. Saralyn Pilar. Follow up BMP will be ordered and will be obtained by home health nurse.  Signed: Clabe Seal  PA-C 05/03/2020, 10:10 AM

## 2020-05-03 NOTE — Progress Notes (Signed)
   05/03/20 0809  Assess: MEWS Score  Temp 97.9 F (36.6 C)  BP 119/68  Pulse Rate 82  Resp (!) 26  SpO2 95 %  O2 Device Room Air  Assess: MEWS Score  MEWS Temp 0  MEWS Systolic 0  MEWS Pulse 0  MEWS RR 2  MEWS LOC 0  MEWS Score 2  MEWS Score Color Yellow  Assess: if the MEWS score is Yellow or Red  Were vital signs taken at a resting state? Yes  Focused Assessment Change from prior assessment (see assessment flowsheet)  Early Detection of Sepsis Score *See Row Information* Low  MEWS guidelines implemented *See Row Information* No, vital signs rechecked  Treat  MEWS Interventions Other (Comment) (rechecked vitals)  Pain Scale 0-10  Pain Score 0  Document  Patient Outcome Other (Comment) (vitals WDL when rechecked)  Progress note created (see row info) Yes

## 2020-05-03 NOTE — Progress Notes (Signed)
Initial Nutrition Assessment  DOCUMENTATION CODES:   Severe malnutrition in context of chronic illness  INTERVENTION:  Will downgrade diet to dysphagia 1 with nectar-thick liquids per most recent SLP recommendation 03/29/2020.  Provide Hormel Shake po BID with lunch and dinner, each supplement provides 520 kcal and 22 grams of protein.  Provide Magic cup TID with meals, each supplement provides 290 kcal and 9 grams of protein.   Provide MVI po daily.  NUTRITION DIAGNOSIS:   Severe Malnutrition related to chronic illness (CHF, dementia) as evidenced by severe muscle depletion,severe fat depletion.  GOAL:   Patient will meet greater than or equal to 90% of their needs  MONITOR:   PO intake,Supplement acceptance,Labs,Weight trends,I & O's  REASON FOR ASSESSMENT:   Malnutrition Screening Tool    ASSESSMENT:   87 year old male with PMHx of dementia, HTN, testicular cancer, hemochromatosis, A-fib, arthritis, stroke, prostate cancer, MDS admitted with acute on chronic CHF, A-fib with RVR.   Met with patient and his daughter at bedside. Daughter reports patient has had a decreased appetite lately while he has been having shortness of breath. She reports at baseline patient has a good appetite and intake. Patient typically eats 3 meals per day but occasionally will only have 2. He lives at home with family who care for him. He typically has pasta, cereal, sausage, pizza, sub sandwiches. Daughter reports patient has difficulty chewing when he has shortness of breath and she will occasionally puree meat for him unless the meat is already soft. Original plan was to downgrade diet to dysphagia 2 but after reviewing chart RD noted that on 03/29/2020 patient was recommended to be on dysphagia 1 diet with nectar-thick liquids by SLP. After discussing with daughter patient had improved and was able to do some thin liquids but he was still receiving some thickened liquids at home. For now will  downgrade diet to what was recommended by SLP. Daughter reports patient does not typically like oral nutrition supplements but that we can try them to help encourage intake. Patient had only eaten about 25% of breakfast tray at time of RD assessment. Daughter reports plan for possible discharge today.  Daughter reports UBW was around 145 lbs. Patient currently documented to be 70.7 kg (155.87 lbs). Bed scale may not be zeroed correctly.  Medications reviewed and include: vitamin D 2000 units daily, Colace 100 mg each evening, Lasix 20 mg Q12hrs IV, Miralax, vitamin B12 1000 micrograms daily.  Labs reviewed: Potassium 3.1.  NUTRITION - FOCUSED PHYSICAL EXAM:  Flowsheet Row Most Recent Value  Orbital Region Severe depletion  Upper Arm Region Severe depletion  Thoracic and Lumbar Region Moderate depletion  Buccal Region Severe depletion  Temple Region Severe depletion  Clavicle Bone Region Severe depletion  Clavicle and Acromion Bone Region Severe depletion  Scapular Bone Region Unable to assess  Dorsal Hand Severe depletion  Patellar Region Severe depletion  Anterior Thigh Region Severe depletion  Posterior Calf Region Severe depletion  Edema (RD Assessment) None  Hair Reviewed  Eyes Unable to assess  Mouth Unable to assess  Skin Reviewed  Nails Reviewed     Diet Order:   Diet Order            DIET - DYS 1 Room service appropriate? Yes; Fluid consistency: Nectar Thick  Diet effective now                EDUCATION NEEDS:   No education needs have been identified at this time  Skin:  Skin Assessment: Skin Integrity Issues: Skin Integrity Issues:: Stage II Stage II: buttocks  Last BM:  05/02/2020 per chart  Height:   Ht Readings from Last 1 Encounters:  05/02/20 '5\' 9"'  (1.753 m)   Weight:   Wt Readings from Last 1 Encounters:  05/03/20 70.7 kg   BMI:  Body mass index is 23.02 kg/m.  Estimated Nutritional Needs:   Kcal:  1800-2000  Protein:  90-100  grams  Fluid:  1.8 L/day  Jacklynn Barnacle, MS, RD, LDN Pager number available on Amion

## 2020-05-04 LAB — HEMOGLOBIN A1C
Hgb A1c MFr Bld: 4.8 % (ref 4.8–5.6)
Mean Plasma Glucose: 91 mg/dL

## 2020-05-16 ENCOUNTER — Ambulatory Visit: Payer: Medicare Other | Admitting: Family

## 2020-05-18 ENCOUNTER — Encounter: Payer: Self-pay | Admitting: Nurse Practitioner

## 2020-05-18 ENCOUNTER — Other Ambulatory Visit: Payer: Self-pay

## 2020-05-18 ENCOUNTER — Other Ambulatory Visit: Payer: Medicare Other | Admitting: Nurse Practitioner

## 2020-05-18 DIAGNOSIS — D469 Myelodysplastic syndrome, unspecified: Secondary | ICD-10-CM

## 2020-05-18 DIAGNOSIS — Z515 Encounter for palliative care: Secondary | ICD-10-CM

## 2020-05-18 NOTE — Progress Notes (Signed)
[facility] Consult Note Telephone: [phone]  Fax: [phone]  PATIENT NAME: Gerald [name] DOB: [birth year] MRN: [id number]  PRIMARY CARE PROVIDER:   [name], [name]  REFERRING PROVIDER:  [name], [name] [address] [facility] [place],  [place] [address] Due to the COVID-19 crisis, this visit was done via telemedicine from my office and it was initiated and consent by this patient and or family.  RESPONSIBLE PARTY:Lillian Mateo Flow daughter [phone]  1.Advance Care Planning;changed to DNR; goldenrod completed, placed in Vynca; will sent DNR form and blank MOST form for ongoing discussions of complex medical decision making  2. Goals of Care: Goals include to maximize quality of life and symptom management. Our advance care planning conversation included a discussion about:   The value and importance of advance care planning  Exploration of personal, cultural or spiritual beliefs that might influence medical decisions  Exploration of goals of care in the event of a sudden injury or illness  Identification and preparation of a healthcare agent  Review and updating or creation of anadvance directive document.  3.Palliative care encounter; Palliative care encounter; Palliative medicine team will continue to support patient, patient's family, and medical team. Visit consisted of counseling and education dealing with the complex and emotionally intense issues of symptom management and palliative care in the setting of serious and potentially life-threatening illness  4. f/u4 weeks or sooner if declines  I spent 35 minutes providing this consultation,  from 1:00pm to 1:35pm. More than 50% of the time in this consultation was spent coordinating communication.   HISTORY OF PRESENT ILLNESS:  Gerald [name] is a 87 y.o. year old male with multiple medical problems including Myelodysplastic  syndrome, testicular cancer s/p sgy, prostate cancer, history basal cell carcinoma, hemochromatosis, hypertension, anemia, appendectomy, cholecystectomy, bilateral hands surgery, kyphoplasty. I called Gerald [name], Mr. Pellow daughter, changed f/u PC visit from in person to telemedicine, telephonic as video not available. Gerald [name] in agreement. Olevia Bowens and I talked about how Mr. Brigham has been feeling. Gerald [name] endorses he is doing better, slow progress. We talked about Mr. Gerald [name] appetite. We talked about recent events including last hospitalization 05/02/2020 to 05/03/2020 for shortness of breath with workup for acute diastolic CHF due to atrial fibrillation with RVR, placed on cardizem drip, HR controlled, transitioned to oral diltiazem with continue metoprolol continued. We talked since returned home, Mr. Gerald [name] has been sleeping a lot. He also had an injection for chronic back pain which seemed to have improved the pain. We talked about functional level, continues to use stand lift for mobility. PT/OT going to work with Mr. Gerald [name]. We talked about medical goals of care. We talked about code status as he is a full code. Gerald [name] endorses wishes are to change to DNR, family in agreement. Will complete goldenrod place in [place]. We talked about MOST form, will send blank copy for family to further review and will revisit. Scheduled f/u PC visit. Questions answered to satisfaction. Emotional support provided.   Palliative Care was asked to help to continue to address goals of care.   CODE STATUS: DNR  PPS: 30% HOSPICE ELIGIBILITY/DIAGNOSIS: TBD  PAST MEDICAL HISTORY:  Past Medical History:  Diagnosis Date  . Anemia   . Arthritis   . Atrial fibrillation ([place])   . Cancer [name])    Testicular  . Hemochromatosis 10/25/2014  . Hx of basal cell carcinoma 2008   R. ant. zygomatic/sideburn 2008, L preauricular 2019  .  Hypertension   . Prostate cancer [facility])     SOCIAL HX:  Social  History   Tobacco Use  . Smoking status: Former Smoker    Packs/day: 1.00    Years: 25.00    Pack years: 25.00    Types: Cigarettes    Quit date: 03/22/1958    Years since quitting: 62.2  . Smokeless tobacco: Never Used  Substance Use Topics  . Alcohol use: No    ALLERGIES:  Allergies  Allergen Reactions  . Levofloxacin Other (See Comments)    Taste alterations for about 1 month after taking med     PERTINENT MEDICATIONS:  Outpatient Encounter Medications as of 05/18/2020  Medication Sig  . acetaminophen (TYLENOL) 650 MG CR tablet Take 650 mg by mouth 2 (two) times daily.  [name] aspirin EC 81 MG tablet Take 81 mg by mouth every evening.   . Calcium-Magnesium-Zinc (CAL-MAG-ZINC PO) Take 1 tablet by mouth daily.  . Cholecalciferol (QC VITAMIN D3) 50 MCG (2000 UT) TABS Take 2,000 Units by mouth daily.  [name] diltiazem (CARDIZEM CD) 120 MG 24 hr capsule Take 1 capsule (120 mg total) by mouth daily.  [name] docusate sodium (COLACE) 100 MG capsule Take 100 mg by mouth every evening.  [name] ELDERBERRY PO Take 2 tablets by mouth daily. Gummy  . ibuprofen (ADVIL) 200 MG tablet Take 200 mg by mouth 2 (two) times daily.  . Lido-Capsaicin-Men-Methyl Sal (MEDI-PATCH-LIDOCAINE EX) Apply 1 patch topically 2 (two) times daily. Applied to back  . losartan (COZAAR) 50 MG tablet Take 1 tablet (50 mg total) by mouth daily.  . metoprolol succinate (TOPROL-XL) 25 MG 24 hr tablet Take 12.5 mg by mouth daily.  . polyethylene glycol (MIRALAX / GLYCOLAX) 17 g packet Take 17 g by mouth daily.  . Potassium 99 MG TABS Take 99 mg by mouth daily.   . Turmeric 500 MG TABS Take 500 mg by mouth at bedtime. w/Ginger  . vitamin B-12 (CYANOCOBALAMIN) 1000 MCG tablet Take 1,000 mcg by mouth daily.   No facility-administered encounter medications on file as of 05/18/2020.    PHYSICAL EXAM:   Deferred Basma Buchner Z Angelisse Riso, NP

## 2020-06-12 ENCOUNTER — Other Ambulatory Visit (HOSPITAL_COMMUNITY): Payer: Self-pay | Admitting: Orthopedic Surgery

## 2020-06-12 ENCOUNTER — Other Ambulatory Visit: Payer: Self-pay

## 2020-06-12 ENCOUNTER — Ambulatory Visit
Admission: RE | Admit: 2020-06-12 | Discharge: 2020-06-12 | Disposition: A | Discharge status: Home / Self Care | Payer: Medicare Other | Source: Ambulatory Visit | Attending: Orthopedic Surgery | Admitting: Orthopedic Surgery

## 2020-06-12 ENCOUNTER — Other Ambulatory Visit: Payer: Self-pay | Admitting: Orthopedic Surgery

## 2020-06-12 DIAGNOSIS — S32050A Wedge compression fracture of fifth lumbar vertebra, initial encounter for closed fracture: Secondary | ICD-10-CM

## 2020-06-23 ENCOUNTER — Other Ambulatory Visit: Payer: Self-pay | Admitting: Orthopedic Surgery

## 2020-06-23 DIAGNOSIS — M549 Dorsalgia, unspecified: Secondary | ICD-10-CM

## 2020-06-27 ENCOUNTER — Other Ambulatory Visit: Payer: Self-pay

## 2020-06-27 ENCOUNTER — Encounter: Payer: Self-pay | Admitting: Nurse Practitioner

## 2020-06-27 ENCOUNTER — Other Ambulatory Visit: Payer: Medicare Other | Admitting: Nurse Practitioner

## 2020-06-27 VITALS — HR 86 | Resp 18

## 2020-06-27 DIAGNOSIS — Z515 Encounter for palliative care: Secondary | ICD-10-CM

## 2020-06-27 DIAGNOSIS — E43 Unspecified severe protein-calorie malnutrition: Secondary | ICD-10-CM

## 2020-06-27 DIAGNOSIS — D469 Myelodysplastic syndrome, unspecified: Secondary | ICD-10-CM

## 2020-06-27 NOTE — Progress Notes (Signed)
[facility] Consult Note Telephone: [phone]  Fax: [phone]    Date of encounter: 06/27/20 PATIENT NAME: Gerald [name] [address] [place] [address]   [phone] (home)  DOB: 1920/07/02 MRN: [id number] PRIMARY CARE PROVIDER:    [name], MD,  [address] [place] [address] [phone]  RESPONSIBLE PARTY:    Contact Information    Name Relation Home Work Mobile   Reiss,[name] Daughter [phone]     [name] Daughter [phone]     Marguerite Olea Daughter [phone]  [phone]     I met face to face with patient and family in Mr. Jindra home. Palliative Care was asked to follow this patient by consultation request of  [name], MD to address advance care planning and complex medical decision making. This is a follow up visit ASSESSMENT AND PLAN / RECOMMENDATIONS:   Advance Care Planning/Goals of Care: Goals include to maximize quality of life and symptom management. Our advance care planning conversation included a discussion about:     The value and importance of advance care planning   Experiences with loved ones who have been seriously ill or have died   Exploration of personal, cultural or spiritual beliefs that might influence medical decisions   Exploration of goals of care in the event of a sudden injury or illness   Identification and preparation of a healthcare agent   Review and updating or creation of an  advance directive document .  Decision not to resuscitate or to de-escalate disease focused treatments due to poor prognosis.  CODE STATUS: DNR  Symptom Management/Plan: 1. Pain secondary to chronic back pain and very small sacral wound. Recent kyphoplasty by Dr Rudene Christians with pending MRI, continue to monitor on pain scale. Continue to use topical analgesics voltaren in addition tylenol with motrin.  2. Protein calorie  malnutrition, continue to encourage to eat, appetite continues to improve. Difficulty to weigh, comfort feeding  3. Fatigue with intermit shortness of breath with exertion and darken stool. Encourage energy conservation. Encourage Mr. Dougher to sleep. [name] and I discussed at length while he is at [facility] for [facility] to see if can get a f/u Oncology appointment with Dr Rogue Bussing with labs out of concern with clinical presentation possible need for transfusion. [name] in agreement. Mr. Zeitlin has to go to physician appointments, testing by ems as he is no longer able to ride in a car.  4. UTI recent, completed antibiotics.   Follow up Palliative Care Visit: Palliative care will continue to follow for complex medical decision making, advance care planning, and clarification of goals. Return 2 weeks or prn.  I spent 60 minutes providing this consultation. More than 50% of the time in this consultation was spent in counseling and care coordination.  PPS: 30%  HOSPICE ELIGIBILITY/DIAGNOSIS: TBD  HISTORY OF PRESENT ILLNESS:  Gerald [name] is a 87 y.o. year old male  with Myelodysplastic syndrome, testicular cancer s/p sgy, prostate cancer, history basal cell carcinoma, hemochromatosis, hypertension, anemia, appendectomy, cholecystectomy, bilateral hands surgery, kyphoplasty. I called [name] to confirm f/u PC visit and covid screening negative. I visited Mr. Pattison in his home, in the den while he layed in the recliner with his feet up. We talked about purpose of PC visit. We talked about how Mr. Neville has been feeling. We talked about symptoms of pain which he does intermit experience lower back, sacrum. We talked about recent visit to Dr  [place] with upcoming MRI. We talked about pain regimen. We talked about functional decline as now Mr. McCone is unable to ride in a car, requires EMS to transport to appointments. We talked about decline in strength. He was placed in the  shower today and completely wore him out. Mr. Housholder does sleep about 20 hours a day. We talked about overall decline, debility. [name] noted dark color stool today, will contact Dr Kary Kos office to see if can bring in stool specimen for guiac. We talked about recent Cardiology visit. We talked about Oncology, Dr Burlene Arnt has not had a recent visit, will contact to see if possible to schedule same day appointment as MRI since so difficult to get to appointments and would benefit from labs with possible transfusion with clinical presentation. [name] in agreement. We talked about medical goals of care. [name] asked about Hospice services under Medicare benefit. We talked about Hospice physicians found eligible for services though aggressive interventions would recommend to have focus on comfort, reviewed Hospice philosophy. We talked about once MRI, Oncology visits completed with possible transfusion will revisit hospice as an option. We talked about role PC in POC. Scheduled f/u visit in 2 weeks, Will contact Dr Rogue Bussing to see if can get f/u visit with labs.   History obtained from review of EMR, interview with family, caregiver and Mr. Mcginley. I reviewed available labs, medications, imaging, studies and related documents from the EMR.  Records reviewed and summarized above.   ROS Full 14 system review of systems performed and negative with exception of: as per HPI.  Physical Exam: Constitutional: NAD General: frail appearing, thin, muscle wasting, elderly, pleasant debilitated, pale male EYES: anicteric sclera, lids intact, no discharge  ENMT: intact hearing, oral mucous membranes moist,  CV: S1S2, RRR, mild LE edema Pulmonary: LCTA, +intermit increased work of breathing with exertion, + cough, room air Abdomen: intake >50%, normo-active BS + 4 quadrants, soft and non tender,  GU: deferred MSK: moves all extremities, lift with transfers Skin: warm and dry Neuro:  + generalized  weakness,  no cognitive impairment Psych: non-anxious affect, A and O x 3  Questions and concerns were addressed. The patient and family was encouraged to call with questions and/or concerns. Provided general support and encouragement, no other unmet needs identified  Thank you for the opportunity to participate in the care of Mr. Ehle.  The palliative care team will continue to follow. Please call our office at [phone] if we can be of additional assistance.   This chart was dictated using voice recognition software. Despite best efforts to proofread, errors can occur which can change the documentation meaning.   Jamez Ambrocio Z Avir Deruiter, NP , MSN, ACHPN  COVID-19 PATIENT SCREENING TOOL Asked and negative response unless otherwise noted:   Have you had symptoms of covid, tested positive or been in contact with someone with symptoms/positive test in the past 5-10 days?

## 2020-06-28 ENCOUNTER — Ambulatory Visit: Payer: Medicare Other

## 2020-06-30 ENCOUNTER — Encounter: Payer: Self-pay | Admitting: Emergency Medicine

## 2020-06-30 ENCOUNTER — Other Ambulatory Visit: Payer: Self-pay

## 2020-06-30 ENCOUNTER — Emergency Department: Payer: Medicare Other

## 2020-06-30 ENCOUNTER — Observation Stay
Admission: EM | Admit: 2020-06-30 | Discharge: 2020-07-01 | Disposition: A | Discharge status: Home / Self Care | Payer: Medicare Other | Attending: Emergency Medicine | Admitting: Emergency Medicine

## 2020-06-30 ENCOUNTER — Ambulatory Visit
Admission: RE | Admit: 2020-06-30 | Discharge: 2020-06-30 | Disposition: A | Discharge status: Home / Self Care | Payer: Medicare Other | Source: Ambulatory Visit | Attending: Orthopedic Surgery | Admitting: Orthopedic Surgery

## 2020-06-30 DIAGNOSIS — M48061 Spinal stenosis, lumbar region without neurogenic claudication: Secondary | ICD-10-CM | POA: Diagnosis not present

## 2020-06-30 DIAGNOSIS — Z96642 Presence of left artificial hip joint: Secondary | ICD-10-CM | POA: Insufficient documentation

## 2020-06-30 DIAGNOSIS — Z20822 Contact with and (suspected) exposure to covid-19: Secondary | ICD-10-CM | POA: Diagnosis not present

## 2020-06-30 DIAGNOSIS — Z8547 Personal history of malignant neoplasm of testis: Secondary | ICD-10-CM | POA: Insufficient documentation

## 2020-06-30 DIAGNOSIS — I110 Hypertensive heart disease with heart failure: Secondary | ICD-10-CM | POA: Diagnosis not present

## 2020-06-30 DIAGNOSIS — Z87891 Personal history of nicotine dependence: Secondary | ICD-10-CM | POA: Insufficient documentation

## 2020-06-30 DIAGNOSIS — M549 Dorsalgia, unspecified: Secondary | ICD-10-CM | POA: Insufficient documentation

## 2020-06-30 DIAGNOSIS — D649 Anemia, unspecified: Secondary | ICD-10-CM

## 2020-06-30 DIAGNOSIS — Z7982 Long term (current) use of aspirin: Secondary | ICD-10-CM | POA: Diagnosis not present

## 2020-06-30 DIAGNOSIS — J9601 Acute respiratory failure with hypoxia: Secondary | ICD-10-CM

## 2020-06-30 DIAGNOSIS — Z66 Do not resuscitate: Secondary | ICD-10-CM | POA: Diagnosis not present

## 2020-06-30 DIAGNOSIS — Z8546 Personal history of malignant neoplasm of prostate: Secondary | ICD-10-CM | POA: Insufficient documentation

## 2020-06-30 DIAGNOSIS — M47816 Spondylosis without myelopathy or radiculopathy, lumbar region: Secondary | ICD-10-CM | POA: Diagnosis not present

## 2020-06-30 DIAGNOSIS — N189 Chronic kidney disease, unspecified: Secondary | ICD-10-CM | POA: Diagnosis not present

## 2020-06-30 DIAGNOSIS — I130 Hypertensive heart and chronic kidney disease with heart failure and stage 1 through stage 4 chronic kidney disease, or unspecified chronic kidney disease: Secondary | ICD-10-CM | POA: Insufficient documentation

## 2020-06-30 DIAGNOSIS — I509 Heart failure, unspecified: Secondary | ICD-10-CM | POA: Insufficient documentation

## 2020-06-30 DIAGNOSIS — Z79899 Other long term (current) drug therapy: Secondary | ICD-10-CM | POA: Diagnosis not present

## 2020-06-30 DIAGNOSIS — Z881 Allergy status to other antibiotic agents status: Secondary | ICD-10-CM | POA: Insufficient documentation

## 2020-06-30 DIAGNOSIS — D638 Anemia in other chronic diseases classified elsewhere: Secondary | ICD-10-CM | POA: Diagnosis not present

## 2020-06-30 DIAGNOSIS — Z8249 Family history of ischemic heart disease and other diseases of the circulatory system: Secondary | ICD-10-CM | POA: Insufficient documentation

## 2020-06-30 DIAGNOSIS — Z85828 Personal history of other malignant neoplasm of skin: Secondary | ICD-10-CM | POA: Insufficient documentation

## 2020-06-30 DIAGNOSIS — I480 Paroxysmal atrial fibrillation: Secondary | ICD-10-CM | POA: Insufficient documentation

## 2020-06-30 DIAGNOSIS — R0902 Hypoxemia: Secondary | ICD-10-CM | POA: Diagnosis present

## 2020-06-30 LAB — BASIC METABOLIC PANEL
Anion gap: 9 (ref 5–15)
BUN: 29 mg/dL — ABNORMAL HIGH (ref 8–23)
CO2: 22 mmol/L (ref 22–32)
Calcium: 8.7 mg/dL — ABNORMAL LOW (ref 8.9–10.3)
Chloride: 109 mmol/L (ref 98–111)
Creatinine, Ser: 0.95 mg/dL (ref 0.61–1.24)
GFR, Estimated: >60 mL/min (ref >60)
Glucose, Bld: 122 mg/dL — ABNORMAL HIGH (ref 70–99)
Potassium: 3.8 mmol/L (ref 3.5–5.1)
Sodium: 140 mmol/L (ref 135–145)

## 2020-06-30 LAB — TROPONIN I (HIGH SENSITIVITY)
Troponin I (High Sensitivity): 15 ng/L (ref <18)
Troponin I (High Sensitivity): 15 ng/L (ref <18)

## 2020-06-30 LAB — RESP PANEL BY RT-PCR (FLU A&B, COVID) ARPGX2
Influenza A by PCR: NEGATIVE
Influenza B by PCR: NEGATIVE
SARS Coronavirus 2 by RT PCR: NEGATIVE

## 2020-06-30 LAB — CBC
HCT: 19.8 % — ABNORMAL LOW (ref 39.0–52.0)
Hemoglobin: 6.2 g/dL — ABNORMAL LOW (ref 13.0–17.0)
MCH: 29.4 pg (ref 26.0–34.0)
MCHC: 31.3 g/dL (ref 30.0–36.0)
MCV: 93.8 fL (ref 80.0–100.0)
Platelets: 431 10*3/uL — ABNORMAL HIGH (ref 150–400)
RBC: 2.11 MIL/uL — ABNORMAL LOW (ref 4.22–5.81)
RDW: 24.6 % — ABNORMAL HIGH (ref 11.5–15.5)
WBC: 10.4 10*3/uL (ref 4.0–10.5)
nRBC: 0.5 % — ABNORMAL HIGH (ref 0.0–0.2)

## 2020-06-30 LAB — PREPARE RBC (CROSSMATCH)

## 2020-06-30 MED ORDER — DARBEPOETIN ALFA 40 MCG/0.4ML IJ SOSY
40.0000 ug | PREFILLED_SYRINGE | Freq: Once | INTRAMUSCULAR | Status: AC
  Administered 2020-06-30: 40 ug via SUBCUTANEOUS
  Filled 2020-06-30: qty 0.4

## 2020-06-30 MED ORDER — SODIUM CHLORIDE 0.9 % IV SOLN
10.0000 mL/h | Freq: Once | INTRAVENOUS | Status: AC
  Administered 2020-07-01: 10 mL/h via INTRAVENOUS

## 2020-06-30 MED ORDER — ACETAMINOPHEN 500 MG PO TABS
1000.0000 mg | ORAL_TABLET | Freq: Once | ORAL | Status: AC
  Administered 2020-06-30: 1000 mg via ORAL
  Filled 2020-06-30: qty 2

## 2020-06-30 NOTE — ED Notes (Signed)
Patient returned from Xray at this time.  Patient placed on monitors, daughter (caregiver) bedside. DNR paperwork bedside.

## 2020-06-30 NOTE — ED Provider Notes (Signed)
Patient received in signout from Dr. Cheri Fowler pending transfusion of 2 units PRBCs and single dose of erythropoietin prior to expected discharge.  EPO provided, and blood transfusions are ongoing.  Second unit is nearly complete by the end of my shift.  No signs of transfusion reaction.  Patient will be further signed out to oncoming provider to ensure PRBC transfusion completes without complication prior to expected discharge, per initial plan of care.   Vladimir Crofts, MD 06/30/20 [phone]

## 2020-06-30 NOTE — ED Triage Notes (Signed)
Pt comes into the ED via [facility].  [facility] was transporting the patient to his outpatient MRI when they noticed that his O2 was at 83% RA w/ an ETCO2 at 19-20.  Pt was belly breathing with EMS.  Pt denies any SHOB.  Pt was having MRi completed for known back pain.  Pt currently able to answer all questions.  Pt does have CHF.  Pt denies any CP, dizziness, or known swelling to extremities.

## 2020-06-30 NOTE — ED Notes (Signed)
Report from [place], rn.

## 2020-06-30 NOTE — ED Notes (Signed)
Patient on continuous monitoring. RN assisting this RN with blood draw for type and screen.

## 2020-07-01 DIAGNOSIS — I509 Heart failure, unspecified: Secondary | ICD-10-CM | POA: Diagnosis not present

## 2020-07-01 DIAGNOSIS — I480 Paroxysmal atrial fibrillation: Secondary | ICD-10-CM

## 2020-07-01 DIAGNOSIS — D649 Anemia, unspecified: Secondary | ICD-10-CM | POA: Diagnosis not present

## 2020-07-01 DIAGNOSIS — I5031 Acute diastolic (congestive) heart failure: Secondary | ICD-10-CM

## 2020-07-01 DIAGNOSIS — D638 Anemia in other chronic diseases classified elsewhere: Secondary | ICD-10-CM | POA: Diagnosis not present

## 2020-07-01 DIAGNOSIS — I10 Essential (primary) hypertension: Secondary | ICD-10-CM

## 2020-07-01 LAB — BASIC METABOLIC PANEL
Anion gap: 9 (ref 5–15)
BUN: 26 mg/dL — ABNORMAL HIGH (ref 8–23)
CO2: 22 mmol/L (ref 22–32)
Calcium: 9 mg/dL (ref 8.9–10.3)
Chloride: 108 mmol/L (ref 98–111)
Creatinine, Ser: 0.92 mg/dL (ref 0.61–1.24)
GFR, Estimated: >60 mL/min (ref >60)
Glucose, Bld: 107 mg/dL — ABNORMAL HIGH (ref 70–99)
Potassium: 3.5 mmol/L (ref 3.5–5.1)
Sodium: 139 mmol/L (ref 135–145)

## 2020-07-01 LAB — VITAMIN B12: Vitamin B-12: 1790 pg/mL — ABNORMAL HIGH (ref 180–914)

## 2020-07-01 LAB — IRON AND TIBC
Iron: 140 ug/dL (ref 45–182)
Saturation Ratios: 55 % — ABNORMAL HIGH (ref 17.9–39.5)
TIBC: 256 ug/dL (ref 250–450)
UIBC: 116 ug/dL

## 2020-07-01 LAB — CBC
HCT: 29.7 % — ABNORMAL LOW (ref 39.0–52.0)
Hemoglobin: 9.7 g/dL — ABNORMAL LOW (ref 13.0–17.0)
MCH: 29 pg (ref 26.0–34.0)
MCHC: 32.7 g/dL (ref 30.0–36.0)
MCV: 88.7 fL (ref 80.0–100.0)
Platelets: 457 10*3/uL — ABNORMAL HIGH (ref 150–400)
RBC: 3.35 MIL/uL — ABNORMAL LOW (ref 4.22–5.81)
RDW: 22.1 % — ABNORMAL HIGH (ref 11.5–15.5)
WBC: 11.2 10*3/uL — ABNORMAL HIGH (ref 4.0–10.5)
nRBC: 0.3 % — ABNORMAL HIGH (ref 0.0–0.2)

## 2020-07-01 LAB — RETICULOCYTES
Immature Retic Fract: 4.4 % (ref 2.3–15.9)
RBC.: 3.15 MIL/uL — ABNORMAL LOW (ref 4.22–5.81)
Retic Count, Absolute: 100.5 10*3/uL (ref 19.0–186.0)
Retic Ct Pct: 3.2 % — ABNORMAL HIGH (ref 0.4–3.1)

## 2020-07-01 LAB — FERRITIN: Ferritin: 1039 ng/mL — ABNORMAL HIGH (ref 24–336)

## 2020-07-01 LAB — LACTATE DEHYDROGENASE: LDH: 167 U/L (ref 98–192)

## 2020-07-01 LAB — FOLATE: Folate: 18.6 ng/mL (ref >5.9)

## 2020-07-01 MED ORDER — DILTIAZEM HCL ER COATED BEADS 120 MG PO CP24
120.0000 mg | ORAL_CAPSULE | Freq: Every day | ORAL | Status: DC
  Administered 2020-07-01: 120 mg via ORAL
  Filled 2020-07-01: qty 1

## 2020-07-01 MED ORDER — ACETAMINOPHEN 325 MG PO TABS: 650.0000 mg | ORAL_TABLET | Freq: Four times a day (QID) | ORAL | Status: DC | PRN

## 2020-07-01 MED ORDER — MAGNESIUM HYDROXIDE 400 MG/5ML PO SUSP: 30.0000 mL | Freq: Every day | ORAL | Status: DC | PRN

## 2020-07-01 MED ORDER — TRAZODONE HCL 50 MG PO TABS: 25.0000 mg | ORAL_TABLET | Freq: Every evening | ORAL | Status: DC | PRN

## 2020-07-01 MED ORDER — FUROSEMIDE 10 MG/ML IJ SOLN: 40.0000 mg | Freq: Once | INTRAMUSCULAR | Status: AC

## 2020-07-01 MED ORDER — VITAMIN B-12 1000 MCG PO TABS
1000.0000 ug | ORAL_TABLET | Freq: Every day | ORAL | Status: DC
  Administered 2020-07-01: 1000 ug via ORAL
  Filled 2020-07-01: qty 1

## 2020-07-01 MED ORDER — LOSARTAN POTASSIUM 50 MG PO TABS
50.0000 mg | ORAL_TABLET | Freq: Every day | ORAL | Status: DC
  Administered 2020-07-01: 50 mg via ORAL
  Filled 2020-07-01: qty 1

## 2020-07-01 MED ORDER — LIDOCAINE 5 % EX PTCH
1.0000 | MEDICATED_PATCH | CUTANEOUS | Status: DC
  Administered 2020-07-01: 1 via TRANSDERMAL
  Filled 2020-07-01: qty 1

## 2020-07-01 MED ORDER — TURMERIC 500 MG PO TABS: 500.0000 mg | ORAL_TABLET | Freq: Every day | ORAL | Status: DC

## 2020-07-01 MED ORDER — NAPROXEN 500 MG PO TABS: 500.0000 mg | ORAL_TABLET | Freq: Two times a day (BID) | ORAL | Status: DC

## 2020-07-01 MED ORDER — VITAMIN D 25 MCG (1000 UNIT) PO TABS
2000.0000 [IU] | ORAL_TABLET | Freq: Every day | ORAL | Status: DC
  Administered 2020-07-01: 2000 [IU] via ORAL
  Filled 2020-07-01: qty 2

## 2020-07-01 MED ORDER — ASPIRIN EC 81 MG PO TBEC: 81.0000 mg | DELAYED_RELEASE_TABLET | Freq: Every evening | ORAL | Status: DC

## 2020-07-01 MED ORDER — FUROSEMIDE 10 MG/ML IJ SOLN
40.0000 mg | Freq: Two times a day (BID) | INTRAMUSCULAR | Status: DC
  Administered 2020-07-01: 40 mg via INTRAVENOUS
  Filled 2020-07-01: qty 4

## 2020-07-01 MED ORDER — ONDANSETRON HCL 4 MG PO TABS: 4.0000 mg | ORAL_TABLET | Freq: Four times a day (QID) | ORAL | Status: DC | PRN

## 2020-07-01 MED ORDER — FUROSEMIDE 10 MG/ML IJ SOLN
INTRAMUSCULAR | Status: AC
  Administered 2020-07-01: 40 mg via INTRAVENOUS
  Filled 2020-07-01: qty 4

## 2020-07-01 MED ORDER — ONDANSETRON HCL 4 MG/2ML IJ SOLN: 4.0000 mg | Freq: Four times a day (QID) | INTRAMUSCULAR | Status: DC | PRN

## 2020-07-01 MED ORDER — POTASSIUM 99 MG PO TABS: 99.0000 mg | ORAL_TABLET | Freq: Every day | ORAL | Status: DC

## 2020-07-01 MED ORDER — POLYETHYLENE GLYCOL 3350 17 G PO PACK
17.0000 g | PACK | Freq: Every day | ORAL | Status: DC
  Administered 2020-07-01: 17 g via ORAL
  Filled 2020-07-01: qty 1

## 2020-07-01 MED ORDER — METOPROLOL SUCCINATE ER 25 MG PO TB24
12.5000 mg | ORAL_TABLET | Freq: Every day | ORAL | Status: DC
  Administered 2020-07-01: 12.5 mg via ORAL
  Filled 2020-07-01: qty 0.5

## 2020-07-01 MED ORDER — ACETAMINOPHEN 650 MG RE SUPP: 650.0000 mg | Freq: Four times a day (QID) | RECTAL | Status: DC | PRN

## 2020-07-01 MED ORDER — FUROSEMIDE 20 MG PO TABS: 20.0000 mg | ORAL_TABLET | Freq: Every day | ORAL | 0 refills | Status: AC

## 2020-07-01 MED ORDER — DOCUSATE SODIUM 100 MG PO CAPS: 100.0000 mg | ORAL_CAPSULE | Freq: Every evening | ORAL | Status: DC

## 2020-07-01 MED ORDER — ENOXAPARIN SODIUM 40 MG/0.4ML ~~LOC~~ SOLN
40.0000 mg | SUBCUTANEOUS | Status: DC
  Administered 2020-07-01: 40 mg via SUBCUTANEOUS
  Filled 2020-07-01: qty 0.4

## 2020-07-01 MED ORDER — LIDO-CAPSAICIN-MEN-METHYL SAL 0.5-0.035-5-20 % EX PTCH: MEDICATED_PATCH | Freq: Two times a day (BID) | CUTANEOUS | Status: DC

## 2020-07-01 MED ORDER — ELDERBERRY 575 MG/5ML PO SYRP: ORAL_SOLUTION | Freq: Every day | ORAL | Status: DC

## 2020-07-01 NOTE — TOC Initial Note (Signed)
Transition of Care [facility]) - Initial/Assessment Note    Patient Details  Name: Gerald [name] MRN: [id number] Date of Birth: [birth year]  Transition of [facility] CM/SW Contact:    Elliot Gurney [place], [place] Phone Number: 07/01/2020, 10:41 AM  Clinical Narrative:                   Expected Discharge Plan: Home/Self Care Barriers to Discharge: No Barriers Identified   Patient Goals and CMS Choice Patient states their goals for this hospitalization and ongoing recovery are:: Per daughter, to return home      Expected Discharge Plan and Services Expected Discharge Plan: Home/Self Care                                              Prior Living Arrangements/Services   Lives with:: Adult Children Patient language and need for interpreter reviewed:: No Do you feel safe going back to the place where you live?: Yes      Need for Family Participation in Patient Care: Yes (Comment) Care giver support system in place?: Yes (comment)   Criminal Activity/Legal Involvement Pertinent to Current Situation/Hospitalization: No - Comment as needed  Activities of Daily Living      Permission Sought/Granted Permission sought to share information with : Family Supports Permission granted to share information with : Yes, Verbal Permission Granted        Permission granted to share info w Relationship: Antony Odea  Permission granted to share info w Contact Information: [phone]  Emotional Assessment       Orientation: : Oriented to Self Alcohol / Substance Use: Not Applicable Psych Involvement: No (comment)  Admission diagnosis:  Acute CHF (congestive heart failure) ([place]) [I50.9] Patient Active Problem List   Diagnosis Date Noted  . Acute CHF (congestive heart failure) ([place]) 07/01/2020  . Protein-calorie malnutrition, severe 05/03/2020  . Atrial fibrillation with RVR ([place]) 05/02/2020  . Acute on chronic diastolic CHF (congestive heart failure) ([place])  05/02/2020  . Normocytic anemia 05/02/2020  . Pleural effusion, bilateral 05/02/2020  . Elevated troponin 05/02/2020  . Palliative care encounter   . Acute proctitis 03/28/2020  . Acute encephalopathy 03/28/2020  . Pressure injury of sacral region, stage 2 ([place]) 03/28/2020  . CVA (cerebral vascular accident) ([place]) 03/28/2020  . Leukocytosis 05/28/2019  . Left displaced femoral neck fracture ([place]) 05/27/2019  . MDS (myelodysplastic syndrome), low grade ([place]) 10/18/2016  . Anemia of chronic disease 10/21/2015  . Hemochromatosis 10/25/2014  . BP (high blood pressure) 10/21/2014  . CA of prostate ([place]) 10/21/2014  . Cancer of testicle ([place]) 10/21/2014  . Anemia 08/24/2014  . Dizziness 09/14/2013  . Difficulty in walking 09/14/2013  . 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 A-fib ([place]) 06/24/2013  . MI (mitral incompetence) 06/24/2013  . Absolute anemia 06/23/2013  . Arthritis, degenerative 06/23/2013  . Essential (primary) hypertension 06/23/2013   PCP:  [name], MD Pharmacy:   [id number] - [place], [place] - [address] [place] [address] Phone: [phone] Fax: [phone]     Social Determinants of Health (SDOH) Interventions    Readmission Risk Interventions No flowsheet data found.

## 2020-07-01 NOTE — Consult Note (Signed)
[facility] Consultation Note  Patient ID: Gerald [name], MRN: [id number], DOB/AGE: [birth year] 87 y.o. Admit date: 06/30/2020   Date of Consult: 07/01/2020 Primary Physician: [name], MD Primary Cardiologist: Paraschos  Chief Complaint:  Chief Complaint  Patient presents with  . Hypoxia   Reason for Consult: Atrial fibrillation  HPI: 87 y.o. male with an apparent significant amount of shortness of breath and weakness with atrial fibrillation with rapid ventricular rate and anemia.  The patient was brought to the emergency room for evaluation of these issues and has had what appears to be significant anemia with a hemoglobin of 6.8.  Troponin was 15 without evidence of acute coronary syndrome.  Chest x-ray showed pulmonary edema.  Previous echocardiogram has had normal LV systolic function and no evidence of significant valvular heart disease requiring further intervention.  The patient does have some hypotension at this time but has received appropriate packed red blood cells without worsening congestive heart failure.  Additionally intravenous Lasix has helped with any hypoxia although currently he is on oxygen.  The patient has palliative care which appears to be primary care issues  Past Medical History:  Diagnosis Date  . Anemia   . Arthritis   . Atrial fibrillation ([place])   . Cancer [facility])    Testicular  . Hemochromatosis 10/25/2014  . Hx of basal cell carcinoma 2008   R. ant. zygomatic/sideburn 2008, L preauricular 2019  . Hypertension   . Prostate cancer [facility])       Surgical History:  Past Surgical History:  Procedure Laterality Date  . ANTERIOR APPROACH HEMI HIP ARTHROPLASTY Left 05/28/2019   Procedure: ANTERIOR APPROACH HEMI HIP ARTHROPLASTY;  Surgeon: Hessie Knows, MD;  Location: [facility] ORS;  Service: Orthopedics;  Laterality: Left;  . APPENDECTOMY    . CHOLECYSTECTOMY    . HAND SURGERY Bilateral   . HERNIA REPAIR    . INNER EAR SURGERY    .  KYPHOPLASTY N/A 12/14/2015   Procedure: KYPHOPLASTY  L2;  Surgeon: Hessie Knows, MD;  Location: [facility] ORS;  Service: Orthopedics;  Laterality: N/A;  . KYPHOPLASTY N/A 12/23/2019   Procedure: KYPHOPLASTY;  Surgeon: Hessie Knows, MD;  Location: [facility] ORS;  Service: Orthopedics;  Laterality: N/A;  . PROSTATE SURGERY    . SURGERY SCROTAL / TESTICULAR Right      Home Meds: Prior to Admission medications   Medication Sig Start Date End Date Taking? Authorizing Provider  acetaminophen (TYLENOL) 650 MG CR tablet Take 650 mg by mouth 2 (two) times daily as needed for pain or fever.   Yes [provider]  aspirin EC 81 MG tablet Take 81 mg by mouth every evening.    Yes [provider]  Calcium-Magnesium-Zinc (CAL-MAG-ZINC PO) Take 1 tablet by mouth daily.   Yes [provider]  Cholecalciferol (QC VITAMIN D3) 50 MCG (2000 UT) TABS Take 2,000 Units by mouth daily.   Yes [provider]  diltiazem (CARDIZEM CD) 120 MG 24 hr capsule Take 1 capsule (120 mg total) by mouth daily. 05/04/20  Yes Danford, Suann Larry, MD  docusate sodium (COLACE) 100 MG capsule Take 100 mg by mouth every evening.   Yes [provider]  ELDERBERRY PO Take 2 tablets by mouth daily. Gummy   Yes [provider]  furosemide (LASIX) 20 MG tablet Take 20 mg by mouth daily as needed for fluid or edema. 05/16/20  Yes [provider]  ibuprofen (ADVIL) 200 MG tablet Take 200 mg by mouth 2 (  two) times daily.   Yes [provider]  Lido-Capsaicin-Men-Methyl Sal (MEDI-PATCH-LIDOCAINE EX) Apply 1 patch topically 2 (two) times daily. Applied to back   Yes [provider]  losartan (COZAAR) 50 MG tablet Take 1 tablet (50 mg total) by mouth daily. 04/05/20  Yes Shawna Clamp, MD  metoprolol succinate (TOPROL-XL) 25 MG 24 hr tablet Take 12.5 mg by mouth daily. 04/28/20  Yes [provider]  polyethylene glycol (MIRALAX / GLYCOLAX) 17 g packet Take 17 g by mouth  daily as needed for mild constipation or moderate constipation.   Yes [provider]  Potassium 99 MG TABS Take 99 mg by mouth daily.    Yes [provider]  Turmeric 500 MG TABS Take 500 mg by mouth at bedtime. w/Ginger   Yes [provider]  vitamin B-12 (CYANOCOBALAMIN) 1000 MCG tablet Take 1,000 mcg by mouth daily.   Yes [provider]    Inpatient Medications:  . aspirin EC  81 mg Oral QPM  . cholecalciferol  2,000 Units Oral Daily  . diltiazem  120 mg Oral Daily  . docusate sodium  100 mg Oral QPM  . enoxaparin (LOVENOX) injection  40 mg Subcutaneous Q24H  . furosemide  40 mg Intravenous Q12H  . lidocaine  1 patch Transdermal Q24H  . losartan  50 mg Oral Daily  . metoprolol succinate  12.5 mg Oral Daily  . naproxen  500 mg Oral BID WC  . polyethylene glycol  17 g Oral Daily  . vitamin B-12  1,000 mcg Oral Daily   . sodium chloride      Allergies:  Allergies  Allergen Reactions  . Levofloxacin Other (See Comments)    Taste alterations for about 1 month after taking med    Social History   Socioeconomic History  . Marital status: Widowed    Spouse name: Not on file  . Number of children: Not on file  . Years of education: Not on file  . Highest education level: Not on file  Occupational History  . Not on file  Tobacco Use  . Smoking status: Former Smoker    Packs/day: 1.00    Years: 25.00    Pack years: 25.00    Types: Cigarettes    Quit date: 03/22/1958    Years since quitting: 62.3  . Smokeless tobacco: Never Used  Vaping Use  . Vaping Use: Never used  Substance and Sexual Activity  . Alcohol use: No  . Drug use: No  . Sexual activity: Never  Other Topics Concern  . Not on file  Social History Narrative  . Not on file   Social Determinants of Health   Financial Resource Strain: Not on file  Food Insecurity: Not on file  Transportation Needs: Not on file  Physical Activity: Not on file  Stress: Not on file   Social Connections: Not on file  Intimate Partner Violence: Not on file     Family History  Problem Relation Age of Onset  . Hypertension Mother      Review of Systems Cannot assess Labs: No results for input(s): CKTOTAL, CKMB, TROPONINI in the last 72 hours. Lab Results  Component Value Date   WBC 10.4 06/30/2020   HGB 6.2 (L) 06/30/2020   HCT 19.8 (L) 06/30/2020   MCV 93.8 06/30/2020   PLT 431 (H) 06/30/2020    Recent Labs  Lab 07/01/20 0300  NA 139  K 3.5  CL 108  CO2 22  BUN 26*  CREATININE 0.92  CALCIUM 9.0  GLUCOSE 107*   Lab Results  Component Value Date   CHOL 112 05/03/2020   HDL 35 (L) 05/03/2020   LDLCALC 58 05/03/2020   TRIG 95 05/03/2020   No results found for: DDIMER  Radiology/Studies:  DG Chest 2 View  Result Date: 06/30/2020 CLINICAL DATA:  Pt comes into the ED via [facility]. [facility] was transporting the patient to his outpatient MRI when they noticed that his O2 was at 83% RA w/ an ETCO2 at 19-20. Pt was belly breathing with EMS. Pt denies any SHOB. Pt was having MRi completed for known back pain. Pt currently able to answer all questions. Pt does have CHF. Pt denies any CP, dizziness, or known swelling to extremitieAFIB EXAM: CHEST - 2 VIEW COMPARISON:  05/02/2020 and older studies.  CT a chest, 03/28/2020. FINDINGS: Cardiac silhouette is normal in size. No mediastinal or hilar masses. No convincing lymphadenopathy. Aortic atherosclerosis. Diffusely thickened interstitial markings most prominent in the lower lungs. Ill-defined hazy opacities noted in the right mid and both lower lungs. Small bilateral pleural effusions. Additional opacity in the dependent lower lobes is consistent with atelectasis. No pneumothorax. Skeletal structures are demineralized but grossly intact. IMPRESSION: 1. Suspect congestive heart failure with pulmonary edema, which is similar to the appearance on the chest radiograph from 05/02/2020. Electronically Signed   By: Lajean Manes  M.D.   On: 06/30/2020 12:44   CT LUMBAR SPINE WO CONTRAST  Result Date: 06/12/2020 CLINICAL DATA:  Back pain.  T12 vertebral augmentation October 2021 EXAM: CT LUMBAR SPINE WITHOUT CONTRAST TECHNIQUE: Multidetector CT imaging of the lumbar spine was performed without intravenous contrast administration. Multiplanar CT image reconstructions were also generated. COMPARISON:  CT abdomen and pelvis 03/28/2020 and 05/02/2020 FINDINGS: Segmentation: 5 non rib-bearing lumbar type vertebral bodies are present. The lowest fully formed vertebral body is L5. Alignment: Slight retrolisthesis at L4-5 is stable. No other significant listhesis is present. There is some straightening of the lumbar lordosis. Rightward curvature of the lumbar spine is centered at L1-2 Vertebrae: Spinal augmentation at T12 and L2 noted. Superior endplate Schmorl's node is present at L3. Asymmetric left-sided endplate changes are present at L2-3 and L3-4. Asymmetric right-sided endplate sclerotic changes are present at L4-5 and L5-S1. Paraspinal and other soft tissues: Bilateral pleural effusions are present. Atherosclerotic calcifications are present in the aortic arch and branch vessels without aneurysm. No solid organ lesions are present. No significant adenopathy is present. Disc levels: And T12-L1: Mild facet hypertrophy is present. No significant disc protrusion or stenosis is present. L1-2: Facet hypertrophy is present. Some retropulsed bone noted at L2. This results in mild foraminal narrowing bilaterally. Central canal is patent. Retropulsed bone does narrow the central canal at the L2 level. Methylmethacrylate extends into the anterior epidural space on the right. L2-3: Leftward disc protrusion extends into the foramen. Moderate left foraminal stenosis is present. Asymmetric left-sided facet hypertrophy contributes. L2-3: A broad-based disc protrusion is present. Moderate facet hypertrophy is noted bilaterally. Moderate central and  bilateral foraminal narrowing noted. L4-5: A broad-based disc protrusion is present. Advanced facet hypertrophy and ligamentum flavum thickening is noted. Severe central canal stenosis is present despite left laminectomy. Moderate to severe foraminal narrowing is worse left than right. L5-S1: Moderate facet hypertrophy is worse on the right. Broad-based disc protrusion noted. Moderate central canal and right foraminal narrowing is present. Left foraminal narrowing is mild. IMPRESSION: 1. Spinal augmentation at T12 and L2. 2. Retropulsed bone at L2 narrows the  central canal at the L2 level. 3. Moderate central and bilateral foraminal narrowing at L2-3. 4. Moderate to severe foraminal narrowing bilaterally at L4-5 is worse on the left. 5. Moderate central canal and right foraminal narrowing at L5-S1. 6. Moderate left foraminal stenosis at L2-3. 7. Moderate central and bilateral foraminal narrowing at L3-4. 8. Bilateral pleural effusions. 9. Aortic Atherosclerosis (ICD10-I70.0). Electronically Signed   By: San Morelle M.D.   On: 06/12/2020 14:49   MR LUMBAR SPINE WO CONTRAST  Result Date: 06/30/2020 CLINICAL DATA:  Low back pain. History of T12 kyphoplasty in October of 2021 EXAM: MRI LUMBAR SPINE WITHOUT CONTRAST TECHNIQUE: Multiplanar, multisequence MR imaging of the lumbar spine was performed. No intravenous contrast was administered. COMPARISON:  MRI 12/20/2019, CT 06/12/2020 FINDINGS: Segmentation: Numbering convention as previously established on priors with the lowest fully formed disc space designated as L5-S1 with a rudimentary disc at S1-S2. Alignment: Trace retrolisthesis L4 on L5. Slight retropulsion at L2. Vertebrae: Status post cement augmentation of the T12 vertebral body. There is bone marrow edema throughout the T12 vertebral body without progression of height loss compared to previous CT. 2 mm retropulsion of the T12 superior endplate. Chronic L2 compression fracture status post cement  augmentation without residual marrow edema. Remaining vertebral body heights are maintained. No evidence of discitis. Scattered T1/T2 hyperintense intraosseous hemangiomas. Diffuse marrow heterogeneity without discrete marrow replacing lesion. Findings are nonspecific but can be seen in the setting of chronic anemia, smoking, and/or obesity. Conus medullaris and cauda equina: Conus extends to the T12-L1 level. Conus and cauda equina appear normal. Paraspinal and other soft tissues: Mild right psoas intramuscular edema. Bilateral renal cysts. Disc levels: T11-T12: Slight retropulsion of the T12 superior endplate. No foraminal or canal stenosis. T12-L1: No significant disc protrusion, foraminal stenosis, or canal stenosis. L1-L2: Minimal broad-based disc bulge and mild L2 superior endplate retropulsion. No foraminal or canal stenosis. L2-L3: Mild L2 inferior endplate retropulsion. Mild bilateral facet hypertrophy. There is mild canal stenosis with left greater than right subarticular recess stenosis. Mild-to-moderate left foraminal stenosis. Unchanged. L3-L4: Mild disc osteophyte complex with bilateral facet arthropathy and ligamentum flavum buckling. Findings contribute to moderate canal stenosis with mild-to-moderate bilateral foraminal stenosis, right slightly greater than left. Unchanged. L4-L5: Posterior disc osteophyte complex with bilateral facet arthropathy and prominent ligamentum flavum buckling. Findings result in severe canal stenosis with moderate to severe right and mild left foraminal stenosis. Unchanged. L5-S1: Disc osteophyte complex with bilateral facet arthropathy and prominent ligamentum flavum buckling. Findings result in mild-to-moderate canal stenosis with moderate right foraminal stenosis. Unchanged. IMPRESSION: 1. Status post cement augmentation of the T12 vertebral body. There is bone marrow edema throughout the T12 vertebral body without progression of height loss compared to previous CT.  This may represent an acute on chronic fracture. 2. Multilevel degenerative changes of the lumbar spine with varying degrees of foraminal and canal stenosis, as described above, not significantly changed from previous CT. 3. At L4-5, there is severe canal stenosis with moderate to severe right and mild left foraminal stenosis. 4. Mild right psoas intramuscular edema, which may be related to muscle strain. Electronically Signed   By: Davina Poke D.O.   On: 06/30/2020 11:55    EKG: Atrial fibrillation with controlled ventricular rate  Weights: Filed Weights   06/30/20 1159  Weight: 65.8 kg     Physical Exam: Blood pressure (!) 163/67, pulse 81, temperature 97.9 F (36.6 C), temperature source Oral, resp. rate (!) 24, height 5\' 9"  (1.753 m), weight  65.8 kg, SpO2 100 %. Body mass index is 21.41 kg/m. General: Well developed, not well nourished, in no acute distress. Head eyes ears nose throat: Normocephalic, atraumatic, sclera non-icteric, no xanthomas, nares are without discharge. No apparent thyromegaly and/or mass  Lungs: Normal respiratory effort.  no wheezes, no rales, diffuse rhonchi.  Heart: Irregular with normal S1 S2. no murmur gallop, no rub, PMI is normal size and placement, carotid upstroke normal without bruit, jugular venous pressure is normal Abdomen: Soft, non-tender, non-distended with normoactive bowel sounds. No hepatomegaly. No rebound/guarding. No obvious abdominal masses. Abdominal aorta is normal size without bruit Extremities: Trace edema. no cyanosis, no clubbing, no ulcers  Peripheral : 2+ bilateral upper extremity pulses, 2+ bilateral femoral pulses, 2+ bilateral dorsal pedal pulse Neuro: Not alert and oriented. No facial asymmetry. No focal deficit. Moves all extremities spontaneously. Musculoskeletal: Normal muscle tone without kyphosis Psych: Does not responds to questions appropriately with a normal affect.    Assessment: 87 year old male with hypertension  and chronic nonvalvular atrial fibrillation with pulmonary edema multifactorial in nature including significant anemia without evidence of myocardial infarction or Acute coronary syndrome  Plan: 1.  Continue furosemide intravenously for further treatment of pulmonary edema and acute diastolic dysfunction congestive heart failure multifactorial in nature 2.  Continue medication management for heart rate control of atrial fibrillation with combination of metoprolol and diltiazem without change in dosage today 3.  No further cardiac diagnostics necessary at this time due to no evidence of acute coronary syndrome 4.  No anticoagulation due to concerns of significant amount of bleeding bruising and significant anemia  Signed, Corey Skains M.D. [facility] 07/01/2020, 9:48 AM

## 2020-07-01 NOTE — ED Notes (Signed)
Patient daughter at bedside updated on inpatient bed assignment and POC.

## 2020-07-01 NOTE — ED Notes (Signed)
Pt removing monitoring equipment and oxygen. Daughter at bedside. Pt informed to please leave monitoring equipment and oxygen in place.

## 2020-07-01 NOTE — ED Notes (Signed)
MD at bedside. PO meds given in applesauce at request of family due to risk of aspiration with water. Patient tolerated well.

## 2020-07-01 NOTE — Consult Note (Signed)
Gerald [name]  [phone] [phone]  ID: Gerald [name] OB: [id number]  MR#: [id number]  DPO#:[id number]  Patient Care Team: [name], MD as PCP - General (Family Medicine) Cammie Sickle, MD as Consulting Physician (Internal Medicine)  CHIEF COMPLAINT: Severe anemia.  INTERVAL HISTORY: Patient is a 87 year old male with a longstanding history of anemia requiring periodic Retacrit injections.  He recently presented to the emergency room after an MRI for significant back pain with increasing weakness and fatigue and hypoxia noted to have a pulse oximetry of 83%.  He has since received 2 units of packed red blood cells and 1 injection of Aranesp.  Plan is to avoid admission and go home today with his daughter.  She reports he has been in bed most of the past several weeks to months secondary to his significant back pain.  He has no neurologic complaints.  He denies any fevers.  He denies any chest pain, cough, or hemoptysis.  He has a fair appetite.  He denies any nausea, vomiting, constipation, or diarrhea.  Daughter reports 1 episode of melena, but this has resolved.  He has no urinary complaints.  Patient offers no further specific complaints today.  REVIEW OF SYSTEMS:   Review of Systems  Constitutional: Positive for malaise/fatigue. Negative for fever and weight loss.  Respiratory: Positive for shortness of breath. Negative for cough and hemoptysis.   Cardiovascular: Negative.  Negative for chest pain and leg swelling.  Gastrointestinal: Positive for melena. Negative for abdominal pain and nausea.  Genitourinary: Negative.   Musculoskeletal: Positive for back pain.  Skin: Negative.  Negative for rash.  Neurological: Positive for weakness. Negative for dizziness, focal weakness and headaches.  Psychiatric/Behavioral: Negative.  The patient is not nervous/anxious.     As per HPI. Otherwise, a complete review of systems is  negative.  PAST MEDICAL HISTORY: Past Medical History:  Diagnosis Date  . Anemia   . Arthritis   . Atrial fibrillation ([place])   . Cancer [facility])    Testicular  . Hemochromatosis 10/25/2014  . Hx of basal cell carcinoma 2008   R. ant. zygomatic/sideburn 2008, L preauricular 2019  . Hypertension   . Prostate cancer ([place])     PAST SURGICAL HISTORY: Past Surgical History:  Procedure Laterality Date  . ANTERIOR APPROACH HEMI HIP ARTHROPLASTY Left 05/28/2019   Procedure: ANTERIOR APPROACH HEMI HIP ARTHROPLASTY;  Surgeon: Hessie Knows, MD;  Location: [facility] ORS;  Service: Orthopedics;  Laterality: Left;  . APPENDECTOMY    . CHOLECYSTECTOMY    . HAND SURGERY Bilateral   . HERNIA REPAIR    . INNER EAR SURGERY    . KYPHOPLASTY N/A 12/14/2015   Procedure: KYPHOPLASTY  L2;  Surgeon: Hessie Knows, MD;  Location: [facility] ORS;  Service: Orthopedics;  Laterality: N/A;  . KYPHOPLASTY N/A 12/23/2019   Procedure: KYPHOPLASTY;  Surgeon: Hessie Knows, MD;  Location: [facility] ORS;  Service: Orthopedics;  Laterality: N/A;  . PROSTATE SURGERY    . SURGERY SCROTAL / TESTICULAR Right     FAMILY HISTORY: Family History  Problem Relation Age of Onset  . Hypertension Mother     ADVANCED DIRECTIVES (Y/N):  @ADVDIR @  HEALTH MAINTENANCE: Social History   Tobacco Use  . Smoking status: Former Smoker    Packs/day: 1.00    Years: 25.00    Pack years: 25.00    Types: Cigarettes    Quit date: 03/22/1958    Years since quitting: 62.3  . Smokeless tobacco: Never Used  Vaping Use  . Vaping Use: Never used  Substance Use Topics  . Alcohol use: No  . Drug use: No     Colonoscopy:  PAP:  Bone density:  Lipid panel:  Allergies  Allergen Reactions  . Levofloxacin Other (See Comments)    Taste alterations for about 1 month after taking med    Current Facility-Administered Medications  Medication Dose Route Frequency Provider Last Rate Last Admin  . acetaminophen (TYLENOL) tablet 650 mg  650 mg Oral Q6H  PRN Mansy, Jan A, MD       Or  . acetaminophen (TYLENOL) suppository 650 mg  650 mg Rectal Q6H PRN Mansy, Jan A, MD      . aspirin EC tablet 81 mg  81 mg Oral QPM Mansy, Jan A, MD      . cholecalciferol (VITAMIN D3) tablet 2,000 Units  2,000 Units Oral Daily Mansy, Jan A, MD   2,000 Units at 07/01/20 1033  . diltiazem (CARDIZEM CD) 24 hr capsule 120 mg  120 mg Oral Daily Mansy, Jan A, MD   120 mg at 07/01/20 1036  . docusate sodium (COLACE) capsule 100 mg  100 mg Oral QPM Mansy, Jan A, MD      . enoxaparin (LOVENOX) injection 40 mg  40 mg Subcutaneous Q24H Mansy, Jan A, MD   40 mg at 07/01/20 1058  . furosemide (LASIX) injection 40 mg  40 mg Intravenous Q12H Mansy, Jan A, MD   40 mg at 07/01/20 0800  . lidocaine (LIDODERM) 5 % 1 patch  1 patch Transdermal Q24H Lorella Nimrod, MD   1 patch at 07/01/20 1053  . losartan (COZAAR) tablet 50 mg  50 mg Oral Daily Mansy, Jan A, MD   50 mg at 07/01/20 1034  . magnesium hydroxide (MILK OF MAGNESIA) suspension 30 mL  30 mL Oral Daily PRN Mansy, Jan A, MD      . metoprolol succinate (TOPROL-XL) 24 hr tablet 12.5 mg  12.5 mg Oral Daily Mansy, Jan A, MD   12.5 mg at 07/01/20 1035  . naproxen (NAPROSYN) tablet 500 mg  500 mg Oral BID WC Lorella Nimrod, MD      . ondansetron (ZOFRAN) tablet 4 mg  4 mg Oral Q6H PRN Mansy, Jan A, MD       Or  . ondansetron [facility]) injection 4 mg  4 mg Intravenous Q6H PRN Mansy, Jan A, MD      . polyethylene glycol (MIRALAX / GLYCOLAX) packet 17 g  17 g Oral Daily Mansy, Jan A, MD   17 g at 07/01/20 1035  . traZODone (DESYREL) tablet 25 mg  25 mg Oral QHS PRN Mansy, Jan A, MD      . vitamin B-12 (CYANOCOBALAMIN) tablet 1,000 mcg  1,000 mcg Oral Daily Mansy, Jan A, MD   1,000 mcg at 07/01/20 1054   Current Outpatient Medications  Medication Sig Dispense Refill  . acetaminophen (TYLENOL) 650 MG CR tablet Take 650 mg by mouth 2 (two) times daily as needed for pain or fever.    [name] aspirin EC 81 MG tablet Take 81 mg by mouth every  evening.     . Calcium-Magnesium-Zinc (CAL-MAG-ZINC PO) Take 1 tablet by mouth daily.    . Cholecalciferol (QC VITAMIN D3) 50 MCG (2000 UT) TABS Take 2,000 Units by mouth daily.    [name] diltiazem (CARDIZEM CD) 120 MG 24 hr capsule Take 1 capsule (120 mg total) by mouth daily. 30 capsule 3  . docusate sodium (  COLACE) 100 MG capsule Take 100 mg by mouth every evening.    [name] ELDERBERRY PO Take 2 tablets by mouth daily. Gummy    . ibuprofen (ADVIL) 200 MG tablet Take 200 mg by mouth 2 (two) times daily.    . Lido-Capsaicin-Men-Methyl Sal (MEDI-PATCH-LIDOCAINE EX) Apply 1 patch topically 2 (two) times daily. Applied to back    . losartan (COZAAR) 50 MG tablet Take 1 tablet (50 mg total) by mouth daily. 30 tablet 1  . metoprolol succinate (TOPROL-XL) 25 MG 24 hr tablet Take 12.5 mg by mouth daily.    . polyethylene glycol (MIRALAX / GLYCOLAX) 17 g packet Take 17 g by mouth daily as needed for mild constipation or moderate constipation.    . Potassium 99 MG TABS Take 99 mg by mouth daily.     . Turmeric 500 MG TABS Take 500 mg by mouth at bedtime. w/Ginger    . vitamin B-12 (CYANOCOBALAMIN) 1000 MCG tablet Take 1,000 mcg by mouth daily.    . furosemide (LASIX) 20 MG tablet Take 1 tablet (20 mg total) by mouth daily. 30 tablet 0    OBJECTIVE: Vitals:   07/01/20 1035 07/01/20 1100  BP: 119/74 106/76  Pulse:  89  Resp:  (!) 26  Temp:    SpO2:  98%     Body mass index is 21.41 kg/m.    ECOG FS:4 - Bedbound  General: Thin, no acute distress. Eyes: Pink conjunctiva, anicteric sclera. HEENT: Normocephalic, moist mucous membranes. Lungs: No audible wheezing or coughing. Heart: Regular rate and rhythm. Abdomen: Soft, nontender, no obvious distention. Musculoskeletal: No edema, cyanosis, or clubbing. Neuro: Alert, answering all questions appropriately. Cranial nerves grossly intact. Skin: No rashes or petechiae noted. Psych: Normal affect.   LAB RESULTS:  Lab Results  Component Value Date   NA  139 07/01/2020   K 3.5 07/01/2020   CL 108 07/01/2020   CO2 22 07/01/2020   GLUCOSE 107 (H) 07/01/2020   BUN 26 (H) 07/01/2020   CREATININE 0.92 07/01/2020   CALCIUM 9.0 07/01/2020   PROT 6.0 (L) 03/30/2020   ALBUMIN 3.2 (L) 03/30/2020   AST 16 03/30/2020   ALT 11 03/30/2020   ALKPHOS 54 03/30/2020   BILITOT 0.9 03/30/2020   GFRNONAA >60 07/01/2020   GFRAA >60 12/13/2019    Lab Results  Component Value Date   WBC 11.2 (H) 07/01/2020   NEUTROABS 7.6 03/27/2020   HGB 9.7 (L) 07/01/2020   HCT 29.7 (L) 07/01/2020   MCV 88.7 07/01/2020   PLT 457 (H) 07/01/2020     STUDIES: DG Chest 2 View  Result Date: 06/30/2020 CLINICAL DATA:  Pt comes into the ED via [facility]. [facility] was transporting the patient to his outpatient MRI when they noticed that his O2 was at 83% RA w/ an ETCO2 at 19-20. Pt was belly breathing with EMS. Pt denies any SHOB. Pt was having MRi completed for known back pain. Pt currently able to answer all questions. Pt does have CHF. Pt denies any CP, dizziness, or known swelling to extremitieAFIB EXAM: CHEST - 2 VIEW COMPARISON:  05/02/2020 and older studies.  CT a chest, 03/28/2020. FINDINGS: Cardiac silhouette is normal in size. No mediastinal or hilar masses. No convincing lymphadenopathy. Aortic atherosclerosis. Diffusely thickened interstitial markings most prominent in the lower lungs. Ill-defined hazy opacities noted in the right mid and both lower lungs. Small bilateral pleural effusions. Additional opacity in the dependent lower lobes is consistent with atelectasis. No pneumothorax. Skeletal structures are  demineralized but grossly intact. IMPRESSION: 1. Suspect congestive heart failure with pulmonary edema, which is similar to the appearance on the chest radiograph from 05/02/2020. Electronically Signed   By: Lajean Manes M.D.   On: 06/30/2020 12:44   CT LUMBAR SPINE WO CONTRAST  Result Date: 06/12/2020 CLINICAL DATA:  Back pain.  T12 vertebral augmentation October  2021 EXAM: CT LUMBAR SPINE WITHOUT CONTRAST TECHNIQUE: Multidetector CT imaging of the lumbar spine was performed without intravenous contrast administration. Multiplanar CT image reconstructions were also generated. COMPARISON:  CT abdomen and pelvis 03/28/2020 and 05/02/2020 FINDINGS: Segmentation: 5 non rib-bearing lumbar type vertebral bodies are present. The lowest fully formed vertebral body is L5. Alignment: Slight retrolisthesis at L4-5 is stable. No other significant listhesis is present. There is some straightening of the lumbar lordosis. Rightward curvature of the lumbar spine is centered at L1-2 Vertebrae: Spinal augmentation at T12 and L2 noted. Superior endplate Schmorl's node is present at L3. Asymmetric left-sided endplate changes are present at L2-3 and L3-4. Asymmetric right-sided endplate sclerotic changes are present at L4-5 and L5-S1. Paraspinal and other soft tissues: Bilateral pleural effusions are present. Atherosclerotic calcifications are present in the aortic arch and branch vessels without aneurysm. No solid organ lesions are present. No significant adenopathy is present. Disc levels: And T12-L1: Mild facet hypertrophy is present. No significant disc protrusion or stenosis is present. L1-2: Facet hypertrophy is present. Some retropulsed bone noted at L2. This results in mild foraminal narrowing bilaterally. Central canal is patent. Retropulsed bone does narrow the central canal at the L2 level. Methylmethacrylate extends into the anterior epidural space on the right. L2-3: Leftward disc protrusion extends into the foramen. Moderate left foraminal stenosis is present. Asymmetric left-sided facet hypertrophy contributes. L2-3: A broad-based disc protrusion is present. Moderate facet hypertrophy is noted bilaterally. Moderate central and bilateral foraminal narrowing noted. L4-5: A broad-based disc protrusion is present. Advanced facet hypertrophy and ligamentum flavum thickening is noted.  Severe central canal stenosis is present despite left laminectomy. Moderate to severe foraminal narrowing is worse left than right. L5-S1: Moderate facet hypertrophy is worse on the right. Broad-based disc protrusion noted. Moderate central canal and right foraminal narrowing is present. Left foraminal narrowing is mild. IMPRESSION: 1. Spinal augmentation at T12 and L2. 2. Retropulsed bone at L2 narrows the central canal at the L2 level. 3. Moderate central and bilateral foraminal narrowing at L2-3. 4. Moderate to severe foraminal narrowing bilaterally at L4-5 is worse on the left. 5. Moderate central canal and right foraminal narrowing at L5-S1. 6. Moderate left foraminal stenosis at L2-3. 7. Moderate central and bilateral foraminal narrowing at L3-4. 8. Bilateral pleural effusions. 9. Aortic Atherosclerosis (ICD10-I70.0). Electronically Signed   By: San Morelle M.D.   On: 06/12/2020 14:49   MR LUMBAR SPINE WO CONTRAST  Result Date: 06/30/2020 CLINICAL DATA:  Low back pain. History of T12 kyphoplasty in October of 2021 EXAM: MRI LUMBAR SPINE WITHOUT CONTRAST TECHNIQUE: Multiplanar, multisequence MR imaging of the lumbar spine was performed. No intravenous contrast was administered. COMPARISON:  MRI 12/20/2019, CT 06/12/2020 FINDINGS: Segmentation: Numbering convention as previously established on priors with the lowest fully formed disc space designated as L5-S1 with a rudimentary disc at S1-S2. Alignment: Trace retrolisthesis L4 on L5. Slight retropulsion at L2. Vertebrae: Status post cement augmentation of the T12 vertebral body. There is bone marrow edema throughout the T12 vertebral body without progression of height loss compared to previous CT. 2 mm retropulsion of the T12 superior endplate. Chronic L2  compression fracture status post cement augmentation without residual marrow edema. Remaining vertebral body heights are maintained. No evidence of discitis. Scattered T1/T2 hyperintense  intraosseous hemangiomas. Diffuse marrow heterogeneity without discrete marrow replacing lesion. Findings are nonspecific but can be seen in the setting of chronic anemia, smoking, and/or obesity. Conus medullaris and cauda equina: Conus extends to the T12-L1 level. Conus and cauda equina appear normal. Paraspinal and other soft tissues: Mild right psoas intramuscular edema. Bilateral renal cysts. Disc levels: T11-T12: Slight retropulsion of the T12 superior endplate. No foraminal or canal stenosis. T12-L1: No significant disc protrusion, foraminal stenosis, or canal stenosis. L1-L2: Minimal broad-based disc bulge and mild L2 superior endplate retropulsion. No foraminal or canal stenosis. L2-L3: Mild L2 inferior endplate retropulsion. Mild bilateral facet hypertrophy. There is mild canal stenosis with left greater than right subarticular recess stenosis. Mild-to-moderate left foraminal stenosis. Unchanged. L3-L4: Mild disc osteophyte complex with bilateral facet arthropathy and ligamentum flavum buckling. Findings contribute to moderate canal stenosis with mild-to-moderate bilateral foraminal stenosis, right slightly greater than left. Unchanged. L4-L5: Posterior disc osteophyte complex with bilateral facet arthropathy and prominent ligamentum flavum buckling. Findings result in severe canal stenosis with moderate to severe right and mild left foraminal stenosis. Unchanged. L5-S1: Disc osteophyte complex with bilateral facet arthropathy and prominent ligamentum flavum buckling. Findings result in mild-to-moderate canal stenosis with moderate right foraminal stenosis. Unchanged. IMPRESSION: 1. Status post cement augmentation of the T12 vertebral body. There is bone marrow edema throughout the T12 vertebral body without progression of height loss compared to previous CT. This may represent an acute on chronic fracture. 2. Multilevel degenerative changes of the lumbar spine with varying degrees of foraminal and canal  stenosis, as described above, not significantly changed from previous CT. 3. At L4-5, there is severe canal stenosis with moderate to severe right and mild left foraminal stenosis. 4. Mild right psoas intramuscular edema, which may be related to muscle strain. Electronically Signed   By: Davina Poke D.O.   On: 06/30/2020 11:55    ASSESSMENT: Severe anemia.  PLAN:    1.  Severe anemia: Likely multifactorial.  Patient's hemoglobin has improved to 9.7 after 2 units of packed red blood cells and 1 dose of Aranesp.  This is approximately his baseline.  No further intervention is needed.  Given patient is bedbound and can only travel via stretcher and EMS, follow-up in the cancer center may be difficult.  Will further discuss with his primary hematologist, Dr. Rogue Bussing, next week. 2.  Possible melena: Resolved.  Given patient's age and performance status, EGD and colonoscopy are not recommended.  Monitor. 3.  Back pain: MRI results as above.  Continue follow-up with orthopedics as scheduled. 4.  History of hemochromatosis: Patient has a persistently elevated ferritin of 1039.  Continue simple observation as previous. 5.  Disposition: Discharged from ER today.  Patient likely will benefit from home health as well as in-home palliative care.   Appreciate consult, call with questions.  Lloyd Huger, MD   07/01/2020 12:42 PM

## 2020-07-01 NOTE — ED Provider Notes (Signed)
Received in signout from Dr. Tamala Julian pending reassessment after blood transfusion.  Review of patient's presentation was reportedly significantly hypoxic at home does not wear home oxygen.  Has chest x-ray showing findings of CHF as well as pulmonary edema.  Has just received 2 units of PRBC with worsening shortness of breath as well as some mild tachypnea.  He is satting well on 2 L right now but do feel patient will require hospitalization for IV diuresis and further observation.   Merlyn Lot, MD 07/01/20 [place]

## 2020-07-01 NOTE — ED Notes (Addendum)
Pt moved into hospital bed for comfort. Pt declines to leave condom cath in place. Pt changed into a brief, buttocks and sacrum are red and irritated, cream applied. Foreskin cleansed and returned to normal position, pt declines offer for po fluids. Oxygen replaced after pt removed again. Cardiac leads replaced after pt removed again. Daughter remains at bedside.

## 2020-07-01 NOTE — H&P (Addendum)
[place]   PATIENT NAME: Gerald [name]    MR#:  [id number]  DATE OF BIRTH:  Dec 12, 1920  DATE OF ADMISSION:  06/30/2020  PRIMARY CARE PHYSICIAN: [name], MD   Patient is coming from: Home   REQUESTING/REFERRING PHYSICIAN: Merlyn Lot, MD  CHIEF COMPLAINT:   Chief Complaint  Patient presents with  . Hypoxia    HISTORY OF PRESENT ILLNESS:  Gerald [name] is a 87 y.o. Caucasian male with medical history significant for atrial shunt, essential hypertension, hemochromatosis, history of prostate and testicular cancer, who presented to the emergency room with acute onset of hypoxemia with a pulse oximetry of 83% on room air while having LS-spine MRI that was ordered by Dr. Rudene Christians for back pain, with associated dyspnea and abdominal breathing.  No significant cough or wheezing.  No fever or chills.  No nausea or vomiting or abdominal pain.  No chest pain or palpitations.  ED Course: Initial vital signs were within normal and later blood pressure was 160/75 with respiratory to 23..  Today was 90% on 2 L of O2 by nasal cannula.  The patient does not wear oxygen at home.  Labs revealed unremarkable BMP.  CBC showed anemia with hemoglobin 6.8 hematocrit 19.8.  Labs revealed with platelets of 431.  His H&H were 9.6 and 30.8 on 05/02/2020.  High-sensitivity troponin I was 15 and later 15.  Influenza antigens and COVID-19 PCR came back negative. EKG as reviewed by me showed atrial fibrillation with controlled ventricular sponsor of 89 and T wave inversion laterally with prolonged QT interval with QTC 563 MS. Imaging: Two-view chest x-ray showed suspected congestive heart failure with pulmonary edema similar to 05/02/2020.  The patient was given 1 g of p.o. Tylenol, 40 mg of IV Lasix and 40 mg subcutaneous Aranesp.  He will be admitted to a progressive unit bed for further evaluation and management. PAST MEDICAL HISTORY:   Past Medical History:  Diagnosis Date  . Anemia    . Arthritis   . Atrial fibrillation ([place])   . Cancer [facility])    Testicular  . Hemochromatosis 10/25/2014  . Hx of basal cell carcinoma 2008   R. ant. zygomatic/sideburn 2008, L preauricular 2019  . Hypertension   . Prostate cancer ([place])     PAST SURGICAL HISTORY:   Past Surgical History:  Procedure Laterality Date  . ANTERIOR APPROACH HEMI HIP ARTHROPLASTY Left 05/28/2019   Procedure: ANTERIOR APPROACH HEMI HIP ARTHROPLASTY;  Surgeon: Hessie Knows, MD;  Location: [facility] ORS;  Service: Orthopedics;  Laterality: Left;  . APPENDECTOMY    . CHOLECYSTECTOMY    . HAND SURGERY Bilateral   . HERNIA REPAIR    . INNER EAR SURGERY    . KYPHOPLASTY N/A 12/14/2015   Procedure: KYPHOPLASTY  L2;  Surgeon: Hessie Knows, MD;  Location: [facility] ORS;  Service: Orthopedics;  Laterality: N/A;  . KYPHOPLASTY N/A 12/23/2019   Procedure: KYPHOPLASTY;  Surgeon: Hessie Knows, MD;  Location: [facility] ORS;  Service: Orthopedics;  Laterality: N/A;  . PROSTATE SURGERY    . SURGERY SCROTAL / TESTICULAR Right     SOCIAL HISTORY:   Social History   Tobacco Use  . Smoking status: Former Smoker    Packs/day: 1.00    Years: 25.00    Pack years: 25.00    Types: Cigarettes    Quit date: 03/22/1958    Years since quitting: 62.3  . Smokeless tobacco: Never Used  Substance Use Topics  . Alcohol use: No  FAMILY HISTORY:   Family History  Problem Relation Age of Onset  . Hypertension Mother     DRUG ALLERGIES:   Allergies  Allergen Reactions  . Levofloxacin Other (See Comments)    Taste alterations for about 1 month after taking med    REVIEW OF SYSTEMS:   ROS As per history of present illness. All pertinent systems were reviewed above. Constitutional, HEENT, cardiovascular, respiratory, GI, GU, musculoskeletal, neuro, psychiatric, endocrine, integumentary and hematologic systems were reviewed and are otherwise negative/unremarkable except for positive findings mentioned above in the HPI.   MEDICATIONS  AT HOME:   Prior to Admission medications   Medication Sig Start Date End Date Taking? Authorizing Provider  acetaminophen (TYLENOL) 650 MG CR tablet Take 650 mg by mouth 2 (two) times daily.    [provider]  aspirin EC 81 MG tablet Take 81 mg by mouth every evening.     [provider]  Calcium-Magnesium-Zinc (CAL-MAG-ZINC PO) Take 1 tablet by mouth daily.    [provider]  Cholecalciferol (QC VITAMIN D3) 50 MCG (2000 UT) TABS Take 2,000 Units by mouth daily.    [provider]  diltiazem (CARDIZEM CD) 120 MG 24 hr capsule Take 1 capsule (120 mg total) by mouth daily. 05/04/20   Danford, Suann Larry, MD  docusate sodium (COLACE) 100 MG capsule Take 100 mg by mouth every evening.    [provider]  ELDERBERRY PO Take 2 tablets by mouth daily. Gummy    [provider]  ibuprofen (ADVIL) 200 MG tablet Take 200 mg by mouth 2 (two) times daily.    [provider]  Lido-Capsaicin-Men-Methyl Sal (MEDI-PATCH-LIDOCAINE EX) Apply 1 patch topically 2 (two) times daily. Applied to back    [provider]  losartan (COZAAR) 50 MG tablet Take 1 tablet (50 mg total) by mouth daily. 04/05/20   Shawna Clamp, MD  metoprolol succinate (TOPROL-XL) 25 MG 24 hr tablet Take 12.5 mg by mouth daily. 04/28/20   [provider]  polyethylene glycol (MIRALAX / GLYCOLAX) 17 g packet Take 17 g by mouth daily.    [provider]  Potassium 99 MG TABS Take 99 mg by mouth daily.     [provider]  Turmeric 500 MG TABS Take 500 mg by mouth at bedtime. w/Ginger    [provider]  vitamin B-12 (CYANOCOBALAMIN) 1000 MCG tablet Take 1,000 mcg by mouth daily.    [provider]      VITAL SIGNS:  Blood pressure (!) 178/97, pulse 89, temperature 97.9 F (36.6 C), temperature source Oral, resp. rate (!) 22, height 5\' 9"  (1.753 m), weight 65.8 kg, SpO2 100 %.  PHYSICAL EXAMINATION:  Physical  Exam  GENERAL:  87 y.o.-year-old Caucasian male patient lying in the bed with mild respiratory distress with conversational dyspnea EYES: Pupils equal, round, reactive to light and accommodation. No scleral icterus. Extraocular muscles intact.  HEENT: Head atraumatic, normocephalic. Oropharynx and nasopharynx clear.  NECK:  Supple, no jugular venous distention. No thyroid enlargement, no tenderness.  LUNGS: Slightly diminished bibasal breath sounds with bibasal rales. CARDIOVASCULAR: Regular rate and rhythm, S1, S2 normal. No murmurs, rubs, or gallops.  ABDOMEN: Soft, nondistended, nontender. Bowel sounds present. No organomegaly or mass.  EXTREMITIES: No pedal edema, cyanosis, or clubbing.  NEUROLOGIC: Cranial nerves II through XII are intact. Muscle strength 5/5 in all extremities. Sensation intact. Gait not checked.  PSYCHIATRIC: The patient is alert and oriented x 3.  Normal affect and good  eye contact. SKIN: No obvious rash, lesion, or ulcer.   LABORATORY PANEL:   CBC Recent Labs  Lab 06/30/20 1256  WBC 10.4  HGB 6.2*  HCT 19.8*  PLT 431*   ------------------------------------------------------------------------------------------------------------------  Chemistries  Recent Labs  Lab 06/30/20 1256  NA 140  K 3.8  CL 109  CO2 22  GLUCOSE 122*  BUN 29*  CREATININE 0.95  CALCIUM 8.7*   ------------------------------------------------------------------------------------------------------------------  Cardiac Enzymes No results for input(s): TROPONINI in the last 168 hours. ------------------------------------------------------------------------------------------------------------------  RADIOLOGY:  DG Chest 2 View  Result Date: 06/30/2020 CLINICAL DATA:  Pt comes into the ED via [facility]. [facility] was transporting the patient to his outpatient MRI when they noticed that his O2 was at 83% RA w/ an ETCO2 at 19-20. Pt was belly breathing with EMS. Pt denies any SHOB. Pt was  having MRi completed for known back pain. Pt currently able to answer all questions. Pt does have CHF. Pt denies any CP, dizziness, or known swelling to extremitieAFIB EXAM: CHEST - 2 VIEW COMPARISON:  05/02/2020 and older studies.  CT a chest, 03/28/2020. FINDINGS: Cardiac silhouette is normal in size. No mediastinal or hilar masses. No convincing lymphadenopathy. Aortic atherosclerosis. Diffusely thickened interstitial markings most prominent in the lower lungs. Ill-defined hazy opacities noted in the right mid and both lower lungs. Small bilateral pleural effusions. Additional opacity in the dependent lower lobes is consistent with atelectasis. No pneumothorax. Skeletal structures are demineralized but grossly intact. IMPRESSION: 1. Suspect congestive heart failure with pulmonary edema, which is similar to the appearance on the chest radiograph from 05/02/2020. Electronically Signed   By: Lajean Manes M.D.   On: 06/30/2020 12:44   MR LUMBAR SPINE WO CONTRAST  Result Date: 06/30/2020 CLINICAL DATA:  Low back pain. History of T12 kyphoplasty in October of 2021 EXAM: MRI LUMBAR SPINE WITHOUT CONTRAST TECHNIQUE: Multiplanar, multisequence MR imaging of the lumbar spine was performed. No intravenous contrast was administered. COMPARISON:  MRI 12/20/2019, CT 06/12/2020 FINDINGS: Segmentation: Numbering convention as previously established on priors with the lowest fully formed disc space designated as L5-S1 with a rudimentary disc at S1-S2. Alignment: Trace retrolisthesis L4 on L5. Slight retropulsion at L2. Vertebrae: Status post cement augmentation of the T12 vertebral body. There is bone marrow edema throughout the T12 vertebral body without progression of height loss compared to previous CT. 2 mm retropulsion of the T12 superior endplate. Chronic L2 compression fracture status post cement augmentation without residual marrow edema. Remaining vertebral body heights are maintained. No evidence of discitis.  Scattered T1/T2 hyperintense intraosseous hemangiomas. Diffuse marrow heterogeneity without discrete marrow replacing lesion. Findings are nonspecific but can be seen in the setting of chronic anemia, smoking, and/or obesity. Conus medullaris and cauda equina: Conus extends to the T12-L1 level. Conus and cauda equina appear normal. Paraspinal and other soft tissues: Mild right psoas intramuscular edema. Bilateral renal cysts. Disc levels: T11-T12: Slight retropulsion of the T12 superior endplate. No foraminal or canal stenosis. T12-L1: No significant disc protrusion, foraminal stenosis, or canal stenosis. L1-L2: Minimal broad-based disc bulge and mild L2 superior endplate retropulsion. No foraminal or canal stenosis. L2-L3: Mild L2 inferior endplate retropulsion. Mild bilateral facet hypertrophy. There is mild canal stenosis with left greater than right subarticular recess stenosis. Mild-to-moderate left foraminal stenosis. Unchanged. L3-L4: Mild disc osteophyte complex with bilateral facet arthropathy and ligamentum flavum buckling. Findings contribute to moderate canal stenosis with mild-to-moderate bilateral foraminal stenosis, right slightly greater than left. Unchanged. L4-L5: Posterior disc osteophyte complex with bilateral  facet arthropathy and prominent ligamentum flavum buckling. Findings result in severe canal stenosis with moderate to severe right and mild left foraminal stenosis. Unchanged. L5-S1: Disc osteophyte complex with bilateral facet arthropathy and prominent ligamentum flavum buckling. Findings result in mild-to-moderate canal stenosis with moderate right foraminal stenosis. Unchanged. IMPRESSION: 1. Status post cement augmentation of the T12 vertebral body. There is bone marrow edema throughout the T12 vertebral body without progression of height loss compared to previous CT. This may represent an acute on chronic fracture. 2. Multilevel degenerative changes of the lumbar spine with varying  degrees of foraminal and canal stenosis, as described above, not significantly changed from previous CT. 3. At L4-5, there is severe canal stenosis with moderate to severe right and mild left foraminal stenosis. 4. Mild right psoas intramuscular edema, which may be related to muscle strain. Electronically Signed   By: Davina Poke D.O.   On: 06/30/2020 11:55      IMPRESSION AND PLAN:  Active Problems:   Acute CHF (congestive heart failure) ([place])  1.  Acute likely diastolic CHF that could be hyperdynamic due to symptomatic anemia. - The patient will be admitted to a progressive unit bed. - The patient will be diuresed with IV Lasix. - He will be typed and crossmatched and transfused 2 units of packed red blood cells with IV Lasix after each unit. -The patient had a recent echo in January with normal EF of 50 to 55% with mild mitral and trivial aortic regurgitation and therefore we will not repeated. - Cardiology consult will be obtained.  2.  Symptomatic anemia with history of hemochromatosis - Management as above. - We will hold off aspirin for now. - We will check stool Hemoccults as well as anemia work-up.  Examination - Hematology consult will be obtained. - I notified Dr. Grayland Ormond about the patient.  3.  Essential hypertension. - We will continue Toprol-XL and Cozaar.  4. Paroxysmal atrial fibrillation. - We will continue Toprol-XL and hold off aspirin.  DVT prophylaxis: Lovenox. Code Status: The patient is DNR/DNI.   Family Communication:  The plan of care was discussed in details with the patient (and family). I answered all questions. The patient agreed to proceed with the above mentioned plan. Further management will depend upon hospital course. Disposition Plan: Back to previous home environment Consults called: Cardiology consult All the records are reviewed and case discussed with ED provider.  Status is: Inpatient  Remains inpatient appropriate because:Ongoing  diagnostic testing needed not appropriate for outpatient work up, Unsafe d/c plan, IV treatments appropriate due to intensity of illness or inability to take PO and Inpatient level of care appropriate due to severity of illness   Dispo: The patient is from: Home              Anticipated d/c is to: Home              Patient currently is not medically stable to d/c.   Difficult to place patient No   TOTAL TIME TAKING CARE OF THIS PATIENT: 55 minutes.    Christel Mormon M.D on 07/01/2020 at 2:07 AM  [facility]   From 7 PM-7 AM, contact night-coverage [email or web address]  CC: Primary care physician; [name], MD

## 2020-07-01 NOTE — ED Notes (Signed)
Report to crystal gualdoni, rn.

## 2020-07-01 NOTE — ED Notes (Signed)
Informed RN bed assigned [id number]

## 2020-07-01 NOTE — ED Notes (Signed)
Pt is currently dry. Pt resting. Daughter provided with blankets, pillows. Meal tray and po fluids previously provided to pt's daughter.

## 2020-07-01 NOTE — ED Notes (Signed)
While changing wet brief and sheets, RN noticed very red and open wounds on sacrum with white cream and powder in place. Family advised they are trying to keep it clean and dry at home. Patient has been wet all morning per family. RN and NT just changed him at 11AM.

## 2020-07-01 NOTE — Care Management CC44 (Signed)
Condition Code 44 Documentation Completed  Patient Details  Name: Gerald [name] MRN: [id number] Date of Birth: 1920/12/02   Condition Code 44 given:  Yes Patient signature on Condition Code 44 notice:  Yes Documentation of 2 MD's agreement:  Yes Code 44 added to claim:  Yes    Ardean Larsen, [place] 07/01/2020, 12:37 PM

## 2020-07-01 NOTE — ED Notes (Signed)
MD at bedside. Advised patient had small choking episode on his breakfast. Also, doesn't want to admit him either. She wants to give one more dose of IV lasix today and some aleve and then send him home.

## 2020-07-01 NOTE — TOC Initial Note (Signed)
Transition of Care [facility]) - Initial/Assessment Note    Patient Details  Name: Gerald [name] MRN: [id number] Date of Birth: [birth year]  Transition of Care [facility]) CM/SW Contact:    Elliot Gurney [place], [place] Phone Number: [phone] 07/01/2020, 10:27 AM  Clinical Narrative:                 Patient is a 87 year old male who presented to the ED with Hypoxia. This Education officer, museum met with patient's daughter to complete heart failure screen. Patient asleep at this time. Patient's daughter at bedside and states that she and her family reside in his home to assist with his care. Per patient's daughter, patient is active with palliative care and his next in home appointment is scheduled for 07/17/20.  Per patient's daughter, patient does have a scale however due to patient's bed bound status is not weighed daily.  A hoyer lift is used to move him from the bed to a chair daily.  Patient is followed by Dr. Lorinda Creed and Mitchel Honour, PA for his heart failure-next appointment is scheduled for 08/16/20. Patient's daughter verbalized having no needs at this time. Patient's chart reviewed, plan is to send patient home following dose of IV lasix today and some aleve.  Natika Geyer, LCSW Transition of Care [phone]   Expected Discharge Plan: Home/Self Care Barriers to Discharge: No Barriers Identified   Patient Goals and CMS Choice        Expected Discharge Plan and Services Expected Discharge Plan: Home/Self Care                                              Prior Living Arrangements/Services   Lives with:: Adult Children Patient language and need for interpreter reviewed:: No Do you feel safe going back to the place where you live?: Yes      Need for Family Participation in Patient Care: Yes (Comment) Care giver support system in place?: Yes (comment)   Criminal Activity/Legal Involvement Pertinent to Current Situation/Hospitalization: No - Comment as  needed  Activities of Daily Living      Permission Sought/Granted Permission sought to share information with : Family Supports Permission granted to share information with : Yes, Verbal Permission Granted        Permission granted to share info w Relationship: Antony Odea  Permission granted to share info w Contact Information: [phone]  Emotional Assessment       Orientation: : Oriented to Self Alcohol / Substance Use: Not Applicable Psych Involvement: No (comment)  Admission diagnosis:  Acute CHF (congestive heart failure) ([place]) [I50.9] Patient Active Problem List   Diagnosis Date Noted  . Acute CHF (congestive heart failure) ([place]) 07/01/2020  . Protein-calorie malnutrition, severe 05/03/2020  . Atrial fibrillation with RVR ([place]) 05/02/2020  . Acute on chronic diastolic CHF (congestive heart failure) ([place]) 05/02/2020  . Normocytic anemia 05/02/2020  . Pleural effusion, bilateral 05/02/2020  . Elevated troponin 05/02/2020  . Palliative care encounter   . Acute proctitis 03/28/2020  . Acute encephalopathy 03/28/2020  . Pressure injury of sacral region, stage 2 ([place]) 03/28/2020  . CVA (cerebral vascular accident) ([place]) 03/28/2020  . Leukocytosis 05/28/2019  . Left displaced femoral neck fracture ([place]) 05/27/2019  . MDS (myelodysplastic syndrome), low grade ([place]) 10/18/2016  . Anemia of chronic disease 10/21/2015  . Hemochromatosis 10/25/2014  . BP (high blood pressure)  10/21/2014  . CA of prostate ([place]) 10/21/2014  . Cancer of testicle ([place]) 10/21/2014  . Anemia 08/24/2014  . Dizziness 09/14/2013  . Difficulty in walking 09/14/2013  . 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 A-fib ([place]) 06/24/2013  . MI (mitral incompetence) 06/24/2013  . Absolute anemia 06/23/2013  . Arthritis, degenerative 06/23/2013  . Essential (primary) hypertension 06/23/2013   PCP:  [name], MD Pharmacy:   [id number] [place], [place] [address] [id number]: [phone]: [phone]    Social Determinants of Health (SDOH) Interventions    Readmission Risk Interventions No flowsheet data found.

## 2020-07-01 NOTE — ED Notes (Signed)
Pt repositioned in bed for comfort. Pt with urine draining into bedside drainage bag.

## 2020-07-01 NOTE — Discharge Summary (Signed)
Physician Discharge Summary  Gerald [name] YCX:[id number] DOB: September 29, 1920 DOA: 06/30/2020  PCP: [name], MD  Admit date: 06/30/2020 Discharge date: 07/01/2020  Admitted From: Home Disposition: Home  Recommendations for Outpatient Follow-up:  1. Follow up with PCP in 1-2 weeks 2. Follow-up with cardiology 3. Please obtain BMP/CBC in one week 4. Please follow up on the following pending results: None  Home Health: No Equipment/Devices: Wheelchair Discharge Condition: Stable CODE STATUS: DNR Diet recommendation:  Dysphagia   Brief/Interim Summary: Gerald [name] is a 87 y.o. Caucasian male with medical history significant for atrial shunt, essential hypertension, hemochromatosis, history of prostate and testicular cancer, who presented to the emergency room with acute onset of hypoxemia with a pulse oximetry of 83% on room air while having LS-spine MRI that was ordered by Dr. Rudene Christians for back pain, with associated dyspnea and abdominal breathing.  No significant cough or wheezing.  No fever or chills.  No nausea or vomiting or abdominal pain.  No chest pain or palpitations.  Patient was found to have elevated blood pressure, chest x-ray concerning for pulmonary edema, EKG with atrial fibrillation and controlled, hemoglobin of 6.2.  Patient received IV Lasix along with 2 units of PRBC.  Posttransfusion hemoglobin at 9.7. Anemia panel with anemia of chronic disease.  Per daughter patient used to get blood transfusions more frequently and then family stop doing it.  Apparently has an history of hemochromatosis.  They have a very extensive set up at home with a lot of family support.  Per daughter he appears at his baseline. He was using Lasix as needed only, now advised to start taking daily and follow-up with his cardiologist for further recommendations.  Patient has A. fib with rate well controlled on Cardizem and metoprolol.  Not a candidate for anticoagulation and will  continue with his medications.  Patient has chronic back pain and follow-up with Dr. Berdine Addison as an outpatient.  MRI done yesterday shows 1. Status post cement augmentation of the T12 vertebral body. There is bone marrow edema throughout the T12 vertebral body without progression of height loss compared to previous CT. This may represent an acute on chronic fracture. 2. Multilevel degenerative changes of the lumbar spine with varying degrees of foraminal and canal stenosis, as described above, not significantly changed from previous CT. 3. At L4-5, there is severe canal stenosis with moderate to severe right and mild left foraminal stenosis. 4. Mild right psoas intramuscular edema, which may be related to muscle strain.  Patient will follow up with Dr. Bridget Hartshorn as an outpatient for further recommendations and continue his current regimen for pain which includes Aleve, ibuprofen and Tylenol.  He will continue rest of his home medications and follow-up with his providers.  Discharge Diagnoses:  Active Problems:   Symptomatic anemia   Acute on chronic congestive heart failure [facility])   Discharge Instructions  Discharge Instructions    Diet - low sodium heart healthy   Complete by: As directed    Discharge instructions   Complete by: As directed    It was pleasure taking care of you. Take your home dose of Lasix daily instead of as needed and follow-up with your cardiologist for further recommendations. Continue taking rest of your medications.   Increase activity slowly   Complete by: As directed      Allergies as of 07/01/2020      Reactions   Levofloxacin Other (See Comments)   Taste alterations for about 1 month after taking med  Medication List    TAKE these medications   acetaminophen 650 MG CR tablet Commonly known as: TYLENOL Take 650 mg by mouth 2 (two) times daily as needed for pain or fever.   aspirin EC 81 MG tablet Take 81 mg by mouth every evening.    CAL-MAG-ZINC PO Take 1 tablet by mouth daily.   diltiazem 120 MG 24 hr capsule Commonly known as: CARDIZEM CD Take 1 capsule (120 mg total) by mouth daily.   docusate sodium 100 MG capsule Commonly known as: COLACE Take 100 mg by mouth every evening.   ELDERBERRY PO Take 2 tablets by mouth daily. Gummy   furosemide 20 MG tablet Commonly known as: LASIX Take 1 tablet (20 mg total) by mouth daily. What changed:   when to take this  reasons to take this   ibuprofen 200 MG tablet Commonly known as: ADVIL Take 200 mg by mouth 2 (two) times daily.   losartan 50 MG tablet Commonly known as: COZAAR Take 1 tablet (50 mg total) by mouth daily.   MEDI-PATCH-LIDOCAINE EX Apply 1 patch topically 2 (two) times daily. Applied to back   metoprolol succinate 25 MG 24 hr tablet Commonly known as: TOPROL-XL Take 12.5 mg by mouth daily.   polyethylene glycol 17 g packet Commonly known as: MIRALAX / GLYCOLAX Take 17 g by mouth daily as needed for mild constipation or moderate constipation.   Potassium 99 MG Tabs Take 99 mg by mouth daily.   QC Vitamin D3 50 MCG (2000 UT) Tabs Generic drug: Cholecalciferol Take 2,000 Units by mouth daily.   Turmeric 500 MG Tabs Take 500 mg by mouth at bedtime. w/Ginger   vitamin B-12 1000 MCG tablet Commonly known as: CYANOCOBALAMIN Take 1,000 mcg by mouth daily.       Follow-up Information    [name], MD. Schedule an appointment as soon as possible for a visit.   Specialty: Family Medicine Contact information: [address] [place] [address] [phone]              Allergies  Allergen Reactions  . Levofloxacin Other (See Comments)    Taste alterations for about 1 month after taking med    Consultations:  Cardiology  Procedures/Studies: DG Chest 2 View  Result Date: 06/30/2020 CLINICAL DATA:  Pt comes into the ED via [facility]. [facility] was transporting the patient to his outpatient MRI when they noticed that his  O2 was at 83% RA w/ an ETCO2 at 19-20. Pt was belly breathing with EMS. Pt denies any SHOB. Pt was having MRi completed for known back pain. Pt currently able to answer all questions. Pt does have CHF. Pt denies any CP, dizziness, or known swelling to extremitieAFIB EXAM: CHEST - 2 VIEW COMPARISON:  05/02/2020 and older studies.  CT a chest, 03/28/2020. FINDINGS: Cardiac silhouette is normal in size. No mediastinal or hilar masses. No convincing lymphadenopathy. Aortic atherosclerosis. Diffusely thickened interstitial markings most prominent in the lower lungs. Ill-defined hazy opacities noted in the right mid and both lower lungs. Small bilateral pleural effusions. Additional opacity in the dependent lower lobes is consistent with atelectasis. No pneumothorax. Skeletal structures are demineralized but grossly intact. IMPRESSION: 1. Suspect congestive heart failure with pulmonary edema, which is similar to the appearance on the chest radiograph from 05/02/2020. Electronically Signed   By: Lajean Manes M.D.   On: 06/30/2020 12:44   CT LUMBAR SPINE WO CONTRAST  Result Date: 06/12/2020 CLINICAL DATA:  Back pain.  T12  vertebral augmentation October 2021 EXAM: CT LUMBAR SPINE WITHOUT CONTRAST TECHNIQUE: Multidetector CT imaging of the lumbar spine was performed without intravenous contrast administration. Multiplanar CT image reconstructions were also generated. COMPARISON:  CT abdomen and pelvis 03/28/2020 and 05/02/2020 FINDINGS: Segmentation: 5 non rib-bearing lumbar type vertebral bodies are present. The lowest fully formed vertebral body is L5. Alignment: Slight retrolisthesis at L4-5 is stable. No other significant listhesis is present. There is some straightening of the lumbar lordosis. Rightward curvature of the lumbar spine is centered at L1-2 Vertebrae: Spinal augmentation at T12 and L2 noted. Superior endplate Schmorl's node is present at L3. Asymmetric left-sided endplate changes are present at L2-3 and  L3-4. Asymmetric right-sided endplate sclerotic changes are present at L4-5 and L5-S1. Paraspinal and other soft tissues: Bilateral pleural effusions are present. Atherosclerotic calcifications are present in the aortic arch and branch vessels without aneurysm. No solid organ lesions are present. No significant adenopathy is present. Disc levels: And T12-L1: Mild facet hypertrophy is present. No significant disc protrusion or stenosis is present. L1-2: Facet hypertrophy is present. Some retropulsed bone noted at L2. This results in mild foraminal narrowing bilaterally. Central canal is patent. Retropulsed bone does narrow the central canal at the L2 level. Methylmethacrylate extends into the anterior epidural space on the right. L2-3: Leftward disc protrusion extends into the foramen. Moderate left foraminal stenosis is present. Asymmetric left-sided facet hypertrophy contributes. L2-3: A broad-based disc protrusion is present. Moderate facet hypertrophy is noted bilaterally. Moderate central and bilateral foraminal narrowing noted. L4-5: A broad-based disc protrusion is present. Advanced facet hypertrophy and ligamentum flavum thickening is noted. Severe central canal stenosis is present despite left laminectomy. Moderate to severe foraminal narrowing is worse left than right. L5-S1: Moderate facet hypertrophy is worse on the right. Broad-based disc protrusion noted. Moderate central canal and right foraminal narrowing is present. Left foraminal narrowing is mild. IMPRESSION: 1. Spinal augmentation at T12 and L2. 2. Retropulsed bone at L2 narrows the central canal at the L2 level. 3. Moderate central and bilateral foraminal narrowing at L2-3. 4. Moderate to severe foraminal narrowing bilaterally at L4-5 is worse on the left. 5. Moderate central canal and right foraminal narrowing at L5-S1. 6. Moderate left foraminal stenosis at L2-3. 7. Moderate central and bilateral foraminal narrowing at L3-4. 8. Bilateral  pleural effusions. 9. Aortic Atherosclerosis (ICD10-I70.0). Electronically Signed   By: San Morelle M.D.   On: 06/12/2020 14:49   MR LUMBAR SPINE WO CONTRAST  Result Date: 06/30/2020 CLINICAL DATA:  Low back pain. History of T12 kyphoplasty in October of 2021 EXAM: MRI LUMBAR SPINE WITHOUT CONTRAST TECHNIQUE: Multiplanar, multisequence MR imaging of the lumbar spine was performed. No intravenous contrast was administered. COMPARISON:  MRI 12/20/2019, CT 06/12/2020 FINDINGS: Segmentation: Numbering convention as previously established on priors with the lowest fully formed disc space designated as L5-S1 with a rudimentary disc at S1-S2. Alignment: Trace retrolisthesis L4 on L5. Slight retropulsion at L2. Vertebrae: Status post cement augmentation of the T12 vertebral body. There is bone marrow edema throughout the T12 vertebral body without progression of height loss compared to previous CT. 2 mm retropulsion of the T12 superior endplate. Chronic L2 compression fracture status post cement augmentation without residual marrow edema. Remaining vertebral body heights are maintained. No evidence of discitis. Scattered T1/T2 hyperintense intraosseous hemangiomas. Diffuse marrow heterogeneity without discrete marrow replacing lesion. Findings are nonspecific but can be seen in the setting of chronic anemia, smoking, and/or obesity. Conus medullaris and cauda equina: Conus extends to  the T12-L1 level. Conus and cauda equina appear normal. Paraspinal and other soft tissues: Mild right psoas intramuscular edema. Bilateral renal cysts. Disc levels: T11-T12: Slight retropulsion of the T12 superior endplate. No foraminal or canal stenosis. T12-L1: No significant disc protrusion, foraminal stenosis, or canal stenosis. L1-L2: Minimal broad-based disc bulge and mild L2 superior endplate retropulsion. No foraminal or canal stenosis. L2-L3: Mild L2 inferior endplate retropulsion. Mild bilateral facet hypertrophy. There  is mild canal stenosis with left greater than right subarticular recess stenosis. Mild-to-moderate left foraminal stenosis. Unchanged. L3-L4: Mild disc osteophyte complex with bilateral facet arthropathy and ligamentum flavum buckling. Findings contribute to moderate canal stenosis with mild-to-moderate bilateral foraminal stenosis, right slightly greater than left. Unchanged. L4-L5: Posterior disc osteophyte complex with bilateral facet arthropathy and prominent ligamentum flavum buckling. Findings result in severe canal stenosis with moderate to severe right and mild left foraminal stenosis. Unchanged. L5-S1: Disc osteophyte complex with bilateral facet arthropathy and prominent ligamentum flavum buckling. Findings result in mild-to-moderate canal stenosis with moderate right foraminal stenosis. Unchanged. IMPRESSION: 1. Status post cement augmentation of the T12 vertebral body. There is bone marrow edema throughout the T12 vertebral body without progression of height loss compared to previous CT. This may represent an acute on chronic fracture. 2. Multilevel degenerative changes of the lumbar spine with varying degrees of foraminal and canal stenosis, as described above, not significantly changed from previous CT. 3. At L4-5, there is severe canal stenosis with moderate to severe right and mild left foraminal stenosis. 4. Mild right psoas intramuscular edema, which may be related to muscle strain. Electronically Signed   By: Davina Poke D.O.   On: 06/30/2020 11:55     Subjective: Patient was seen and examined today.  Eating breakfast with the help of daughter.  At baseline mostly stays in bed or recliner.  A lot of family support in place.  Family is using [employer] lift for the transport from bed to chair and back.  At baseline patient has mild dysphagia and uses nectar thick liquids.  Had backache for which he uses naproxen or ibuprofen with Tylenol. Per daughter he appears to be at his baseline.  He  was also getting some blood transfusions before but family stopped doing it for a while now, she was glad that he received some blood transfusions and feeling much improved.  Would like to go back home, stating that he has all the set up at home and stay more comfortable.  He does follow-up with palliative care as an outpatient.  Discharge Exam: Vitals:   07/01/20 1035 07/01/20 1100  BP: 119/74 106/76  Pulse:  89  Resp:  (!) 26  Temp:    SpO2:  98%   Vitals:   07/01/20 0900 07/01/20 1000 07/01/20 1035 07/01/20 1100  BP: (!) 163/67 129/67 119/74 106/76  Pulse: 81 91  89  Resp: (!) 24 17  (!) 26  Temp:      TempSrc:      SpO2: 100% 99%  98%  Weight:      Height:        General: Pt is alert, awake, not in acute distress Cardiovascular: RRR, S1/S2 +, no rubs, no gallops Respiratory: CTA bilaterally, no wheezing, no rhonchi Abdominal: Soft, NT, ND, bowel sounds + Extremities: no edema, no cyanosis   The results of significant diagnostics from this hospitalization (including imaging, microbiology, ancillary and laboratory) are listed below for reference.    Microbiology: Recent Results (from the past 240 hour(s))  Resp Panel by RT-PCR (Flu A&B, Covid) Nasopharyngeal Swab     Status: None   Collection Time: 06/30/20 12:56 PM   Specimen: Nasopharyngeal Swab; Nasopharyngeal(NP) swabs in vial transport medium  Result Value Ref Range Status   SARS Coronavirus 2 by RT PCR NEGATIVE NEGATIVE Final    Comment: (NOTE) SARS-CoV-2 target nucleic acids are NOT DETECTED.  The SARS-CoV-2 RNA is generally detectable in upper respiratory specimens during the acute phase of infection. The lowest concentration of SARS-CoV-2 viral copies this assay can detect is 138 copies/mL. A negative result does not preclude SARS-Cov-2 infection and should not be used as the sole basis for treatment or other patient management decisions. A negative result may occur with  improper specimen  collection/handling, submission of specimen other than nasopharyngeal swab, presence of viral mutation(s) within the areas targeted by this assay, and inadequate number of viral copies(<138 copies/mL). A negative result must be combined with clinical observations, patient history, and epidemiological information. The expected result is Negative.  Fact Sheet for Patients:  EntrepreneurPulse.com.au  Fact Sheet for Healthcare Providers:  IncredibleEmployment.be  This test is no t yet approved or cleared by the [place] FDA and  has been authorized for detection and/or diagnosis of SARS-CoV-2 by FDA under an Emergency Use Authorization (EUA). This EUA will remain  in effect (meaning this test can be used) for the duration of the COVID-19 declaration under Section 564(b)(1) of the Act, 21 U.S.C.section 360bbb-3(b)(1), unless the authorization is terminated  or revoked sooner.       Influenza A by PCR NEGATIVE NEGATIVE Final   Influenza B by PCR NEGATIVE NEGATIVE Final    Comment: (NOTE) The Xpert Xpress SARS-CoV-2/FLU/RSV plus assay is intended as an aid in the diagnosis of influenza from Nasopharyngeal swab specimens and should not be used as a sole basis for treatment. Nasal washings and aspirates are unacceptable for Xpert Xpress SARS-CoV-2/FLU/RSV testing.  Fact Sheet for Patients: EntrepreneurPulse.com.au  Fact Sheet for Healthcare Providers: IncredibleEmployment.be  This test is not yet approved or cleared by the [place] FDA and has been authorized for detection and/or diagnosis of SARS-CoV-2 by FDA under an Emergency Use Authorization (EUA). This EUA will remain in effect (meaning this test can be used) for the duration of the COVID-19 declaration under Section 564(b)(1) of the Act, 21 U.S.C. section 360bbb-3(b)(1), unless the authorization is terminated or revoked.  Performed at [facility], [address] [place], [place] [address]      Labs: BNP (last 3 results) Recent Labs    03/28/20 0914 05/02/20 1302  BNP 274.1* 295.1*   Basic Metabolic Panel: Recent Labs  Lab 06/30/20 1256 07/01/20 0300  NA 140 139  K 3.8 3.5  CL 109 108  CO2 22 22  GLUCOSE 122* 107*  BUN 29* 26*  CREATININE 0.95 0.92  CALCIUM 8.7* 9.0   Liver Function Tests: No results for input(s): AST, ALT, ALKPHOS, BILITOT, PROT, ALBUMIN in the last 168 hours. No results for input(s): LIPASE, AMYLASE in the last 168 hours. No results for input(s): AMMONIA in the last 168 hours. CBC: Recent Labs  Lab 06/30/20 1256 07/01/20 1102  WBC 10.4 11.2*  HGB 6.2* 9.7*  HCT 19.8* 29.7*  MCV 93.8 88.7  PLT 431* 457*   Cardiac Enzymes: No results for input(s): CKTOTAL, CKMB, CKMBINDEX, TROPONINI in the last 168 hours. BNP: Invalid input(s): POCBNP CBG: No results for input(s): GLUCAP in the last 168 hours. D-Dimer No results for input(s): DDIMER in  the last 72 hours. Hgb A1c No results for input(s): HGBA1C in the last 72 hours. Lipid Profile No results for input(s): CHOL, HDL, LDLCALC, TRIG, CHOLHDL, LDLDIRECT in the last 72 hours. Thyroid function studies No results for input(s): TSH, T4TOTAL, T3FREE, THYROIDAB in the last 72 hours.  Invalid input(s): FREET3 Anemia work up Recent Labs    07/01/20 0237 07/01/20 0300  FOLATE  --  18.6  FERRITIN  --  1,039*  TIBC  --  256  IRON  --  140  RETICCTPCT 3.2*  --    Urinalysis    Component Value Date/Time   COLORURINE YELLOW (A) 05/02/2020 1352   APPEARANCEUR HAZY (A) 05/02/2020 1352   APPEARANCEUR Clear 05/05/2013 1500   LABSPEC 1.020 05/02/2020 1352   LABSPEC 1.014 05/05/2013 1500   PHURINE 6.0 05/02/2020 1352   GLUCOSEU NEGATIVE 05/02/2020 1352   GLUCOSEU Negative 05/05/2013 1500   HGBUR NEGATIVE 05/02/2020 1352   BILIRUBINUR NEGATIVE 05/02/2020 1352   BILIRUBINUR Negative 05/05/2013 1500   KETONESUR NEGATIVE  05/02/2020 1352   PROTEINUR NEGATIVE 05/02/2020 1352   NITRITE NEGATIVE 05/02/2020 1352   LEUKOCYTESUR NEGATIVE 05/02/2020 1352   LEUKOCYTESUR Negative 05/05/2013 1500   Sepsis Labs Invalid input(s): PROCALCITONIN,  WBC,  LACTICIDVEN Microbiology Recent Results (from the past 240 hour(s))  Resp Panel by RT-PCR (Flu A&B, Covid) Nasopharyngeal Swab     Status: None   Collection Time: 06/30/20 12:56 PM   Specimen: Nasopharyngeal Swab; Nasopharyngeal(NP) swabs in vial transport medium  Result Value Ref Range Status   SARS Coronavirus 2 by RT PCR NEGATIVE NEGATIVE Final    Comment: (NOTE) SARS-CoV-2 target nucleic acids are NOT DETECTED.  The SARS-CoV-2 RNA is generally detectable in upper respiratory specimens during the acute phase of infection. The lowest concentration of SARS-CoV-2 viral copies this assay can detect is 138 copies/mL. A negative result does not preclude SARS-Cov-2 infection and should not be used as the sole basis for treatment or other patient management decisions. A negative result may occur with  improper specimen collection/handling, submission of specimen other than nasopharyngeal swab, presence of viral mutation(s) within the areas targeted by this assay, and inadequate number of viral copies(<138 copies/mL). A negative result must be combined with clinical observations, patient history, and epidemiological information. The expected result is Negative.  Fact Sheet for Patients:  EntrepreneurPulse.com.au  Fact Sheet for Healthcare Providers:  IncredibleEmployment.be  This test is no t yet approved or cleared by the [place] FDA and  has been authorized for detection and/or diagnosis of SARS-CoV-2 by FDA under an Emergency Use Authorization (EUA). This EUA will remain  in effect (meaning this test can be used) for the duration of the COVID-19 declaration under Section 564(b)(1) of the Act, 21 U.S.C.section  360bbb-3(b)(1), unless the authorization is terminated  or revoked sooner.       Influenza A by PCR NEGATIVE NEGATIVE Final   Influenza B by PCR NEGATIVE NEGATIVE Final    Comment: (NOTE) The Xpert Xpress SARS-CoV-2/FLU/RSV plus assay is intended as an aid in the diagnosis of influenza from Nasopharyngeal swab specimens and should not be used as a sole basis for treatment. Nasal washings and aspirates are unacceptable for Xpert Xpress SARS-CoV-2/FLU/RSV testing.  Fact Sheet for Patients: EntrepreneurPulse.com.au  Fact Sheet for Healthcare Providers: IncredibleEmployment.be  This test is not yet approved or cleared by the [place] FDA and has been authorized for detection and/or diagnosis of SARS-CoV-2 by FDA under an Emergency Use Authorization (EUA). This EUA will remain  in effect (meaning this test can be used) for the duration of the COVID-19 declaration under Section 564(b)(1) of the Act, 21 U.S.C. section 360bbb-3(b)(1), unless the authorization is terminated or revoked.  Performed at [facility], [address] [place], [place] [address]     Time coordinating discharge: Over 30 minutes  SIGNED:  Lorella Nimrod, MD  [facility] 07/01/2020, 12:27 PM  If 7PM-7AM, please contact night-coverage [email or web address]  This record has been created using Systems analyst. Errors have been sought and corrected,but may not always be located. Such creation errors do not reflect on the standard of care.

## 2020-07-01 NOTE — ED Notes (Signed)
hospitalist in to see pt.

## 2020-07-02 LAB — TYPE AND SCREEN
ABO/RH(D): O POS
Antibody Screen: NEGATIVE
Unit division: 0
Unit division: 0

## 2020-07-02 LAB — BPAM RBC
Blood Product Expiration Date: 202205102359
Blood Product Expiration Date: 202205102359
ISSUE DATE / TIME: 202204151704
ISSUE DATE / TIME: 202204152001
Unit Type and Rh: 5100
Unit Type and Rh: 5100

## 2020-07-03 ENCOUNTER — Telehealth: Payer: Self-pay | Admitting: Nurse Practitioner

## 2020-07-03 ENCOUNTER — Telehealth: Payer: Self-pay | Admitting: *Deleted

## 2020-07-03 NOTE — Telephone Encounter (Signed)
Per Dr. Grayland Ormond, patient evaluated in the ER this weekend. Please advise on followup

## 2020-07-03 NOTE — Telephone Encounter (Signed)
Md msg Helyn App, NP to collaborate pt's care with hospice goals.

## 2020-07-03 NOTE — Telephone Encounter (Signed)
Reviewed ED visit for anemia requiring blood transfusion, Dr Rogue Bussing asked for further discussion of goals of care with family. I called Gerald [name], [name] daughter. We talked about recent ED visit for anemia with CHF. We talked about overall debility, decline. We talked about MRI was moved up and now resulted, but Dr Rudene Christians is out of town for the next week so they do not know results or options of kyphoplasty or injection is available. We did talk about option of Hospice services. Gerald [name] endorses she will further discuss with her siblings about Hospice but wants to wait to hear what Dr Rudene Christians says about the options. Therapeutic listening, emotional support provided. I updated Dr Burlene [facility].

## 2020-07-06 ENCOUNTER — Ambulatory Visit: Payer: Medicare Other

## 2020-07-19 ENCOUNTER — Other Ambulatory Visit: Payer: Medicare Other | Admitting: Nurse Practitioner

## 2020-07-19 ENCOUNTER — Other Ambulatory Visit: Payer: Self-pay

## 2020-07-19 ENCOUNTER — Encounter: Payer: Self-pay | Admitting: Nurse Practitioner

## 2020-07-19 DIAGNOSIS — G8929 Other chronic pain: Secondary | ICD-10-CM

## 2020-07-19 DIAGNOSIS — E43 Unspecified severe protein-calorie malnutrition: Secondary | ICD-10-CM

## 2020-07-19 DIAGNOSIS — Z515 Encounter for palliative care: Secondary | ICD-10-CM

## 2020-07-19 NOTE — Progress Notes (Signed)
[facility] Consult Note Telephone: [phone]  Fax: [phone]    Date of encounter: 07/19/20 PATIENT NAME: Gerald [name] [address] [place] [address]   [phone] (home)  DOB: [birth year] MRN: [id number] PRIMARY CARE PROVIDER:    [name], MD,  [place] [address] [phone]  RESPONSIBLE PARTY:    Contact Information    Name Relation Home Work [place] Daughter [phone]  [phone]   Reiss,[name] Daughter [phone]     [facility] Daughter [phone]       I met face to face with patient and family in home. Palliative Care was asked to follow this patient by consultation request of  [name], MD to address advance care planning and complex medical decision making. This is a follow up visit.  ASSESSMENT AND PLAN / RECOMMENDATIONS:   Advance Care Planning/Goals of Care: Goals include to maximize quality of life and symptom management. Our advance care planning conversation included a discussion about:     The value and importance of advance care planning   Experiences with loved ones who have been seriously ill or have died   Exploration of personal, cultural or spiritual beliefs that might influence medical decisions   Exploration of goals of care in the event of a sudden injury or illness   Identification and preparation of a healthcare agent   Review and updating or creation of an  advance directive document .  Decision not to resuscitate or to de-escalate disease focused treatments due to poor prognosis.  CODE STATUS: DNR  Symptom Management/Plan: 1. Pain secondary to chronic back pain and very small sacral wound.  MRI did not reveal acute fx, continue to monitor on pain scale. Continue to use topical analgesics voltaren in addition tylenol with motrin.  2. Protein calorie malnutrition, continue to  encourage to eat, appetite continues to improve. Difficulty to weigh, comfort feeding  3. Fatigue with intermit shortness of breath with exertion secondary to severe debility; continue support, ongoing discussions of disease progression, encourage energy conservation, allow Gerald [name] to rest.  Follow up Palliative Care Visit: Palliative care will continue to follow for complex medical decision making, advance care planning, and clarification of goals. Return 2 weeks or prn.  I spent 75 minutes providing this consultation. More than 50% of the time in this consultation was spent in counseling and care coordination.  PPS: 30%  HOSPICE ELIGIBILITY/DIAGNOSIS: TBD  Chief Complaint: follow-up palliative consult for complex medical decision making  HISTORY OF PRESENT ILLNESS:  Gerald [name] is a 87 y.o. year old male  with Myelodysplastic syndrome, testicular cancer s/p sgy, prostate cancer, history basal cell carcinoma, hemochromatosis, hypertension, anemia, appendectomy. Gerald. [name] required Hospitalization from 06/30/2020 to 07/01/2020 for symptomatic anemia requiring 2 units to be transfused and erythropoietin to be given. He was then d/c home. I called [name], Gerald [name] daughter to confirm follow up palliative care visit in [place] screening which was negative. I visited Gerald [name] in his home and he was sitting in his living room with his feet up in the recliner. we talkedIt did not reveal any new fractures. We talked about symptoms of pain for which he does continue to take Advil. We talked about the sacral wound. We talked about his appetite which has improved depending on the day. Gerald [name] became short of breath with leaning forward up to auscultate his lungs which were decreased throughout.  Gerald [name] being overall fatigued. We talked about recent visit for transfusion as hemoglobin was low and erythropoietin injection. I met separately from Gerald [name] with all of his  daughter We talked about Hospice benefit under Medicare program. We talked about what Hospice services would provide. We talked at length about Hospice philosophy. We talked about blood transfusions, rehospitalization, erythropoietin injection. Discuss that Hospice would not approve erythropoietin injection or likely transfusion. We talked about different scenarios which would require transfusion. We talked about quality of life. We talked about suffering and what that would look like to them. We talked about Gerald [name] becoming exhausted when he is moved or even EMS rides to doctors appointments or to have lab draws, transfusions. We talked about natural disease progression in the setting of aging.Hyacinth Meeker and [name] talked about Gerald [name] sharing that he was ready to pass when it is his time. We talked about artificial means to keep someone alive such as blood transfusions or recrit injection. We talked about what chronic disease progression and the impact on one's body. We talked about role of palliative care in plan of care. Discuss that family will need to further discuss weighing options about Hospice, blood transfusions. [name] will re-contact once they decide if they wanna proceed with Hospice or schedule follow up palliative care visit. Therapeutic listening and emotional support provided. Questions answered to satisfaction. [name] verbalized they were thankful for palliative care visit and discussions that it was helpful with information. Encouraged [name] to contact Dr Rogue Bussing Oncologist for further discussion about transfusions and erythropoietin longevity   History obtained from review of EMR, discussion with interview with family  and Gerald. [name]. I reviewed available labs, medications, imaging, studies and related documents from the EMR.  Records reviewed and summarized above.   ROS Full 14 system review of systems performed and negative with exception of: as per  HPI.  Physical Exam: Constitutional: NAD General: frail appearing, thin, severely debilitated, weak male EYES: lids intact ENMT: intact hearing, oral mucous membranes moist, dentition intact CV: S1S2, RRR Pulmonary: decrease bs, room air Abdomen: normo-active BS + 4 quadrants, soft and non tender GU: deferred MSK: muscle wasting, bedbound Skin: warm and dry, no rashes or wounds on visible skin Neuro:  + generalized weakness,  no cognitive impairment Psych: flat affect, A and O x 3 Hem/lymph/immuno: no widespread bruising  Questions and concerns were addressed. The patient/family was encouraged to call with questions and/or concerns. Contact information was provided. Provided general support and encouragement, no other unmet needs identified  Thank you for the opportunity to participate in the care of Gerald. [name].  The palliative care team will continue to follow. Please call our office at [phone] if we can be of additional assistance.   This chart was dictated using voice recognition software. Despite best efforts to proofread, errors can occur which can change the documentation meaning.   Gerald Friend Z Amaura Authier, NP , MSN,  ACHPN  COVID-19 PATIENT SCREENING TOOL Asked and negative response unless otherwise noted:   Have you had symptoms of covid, tested positive or been in contact with someone with symptoms/positive test in the past 5-10 days? NO

## 2020-07-26 ENCOUNTER — Telehealth: Payer: Self-pay | Admitting: Nurse Practitioner

## 2020-07-26 NOTE — Telephone Encounter (Signed)
I called Gerald [name], Gerald [name] daughter to follow-up on discussion about blood transfusions/erythropoietin vs option of Hospice services through Medicare benefit. [name] is Hospice eligible per Hospice Physician, Dr Gilford Rile. Gerald [name] endorses she talked with her sisters and wishes are to continue to proceed with blood transfusions/erythropoietin. Gerald [name] endorses if needed they will hire a nurse to give the erythropoietin injection at home if unable to get him to the cancer center by ems as he is bed-bound. Gerald [name] endorses they defer Hospice services at this time. Gerald [name] agreeable to schedule f/u PC visit but to call back another day to schedule as she was in the middle of something at home. Will reschedule

## 2020-08-18 ENCOUNTER — Telehealth: Payer: Self-pay | Admitting: Nurse Practitioner

## 2020-08-18 NOTE — Telephone Encounter (Signed)
I called Gerald [name], left message to return call concerning PC visit. Gerald [name] returned call, left message and I returned call. Gerald [name] and I talked about PC visit. We talked about how [name] has been doing. He had covid with worse symptom being diarrhea. He continue to be total care. No recent hospitalizations. Gerald [name] endorses the horn on his forehead is growing, making it difficult to look at. Gerald [name] asked how it can be removed. Recommended taking a picture, sending to Dr Kary Kos to see if this can be removed in his office as Mr. Pardon is only stretcher transport and would be difficult to get into a dermatology office. Gerald [name] endorses she will contact Dr Kary Kos office. We talked about appetite which continues to be good. He is able to eat some finger foods but mostly fed by his daughters. We talked about option of RN/SW PC program to see if he would be eligible with closer monitoring and labs to be drawn. Gerald [name] endorses they would like to have labs drawn to see what his blood counts are, albumin/total protein. Discussed communicated with PC to see if that is an option. Discussed with Gerald [name] will f/u 4 weeks or sooner if declines. Gerald [name] in agreement, appointment scheduled. Hopefully can get labs drawn this coming week in the home.   Total time 25 minutes Documentation 5 minutes Phone discussion 20 minutes

## 2020-08-22 ENCOUNTER — Telehealth: Payer: Self-pay

## 2020-08-22 ENCOUNTER — Telehealth: Payer: Self-pay | Admitting: *Deleted

## 2020-08-22 NOTE — Telephone Encounter (Signed)
Patient's daughter called reporting that patient is unable to come in to office without transportation from EMS bringing him in. She was asking if we can accommodate that stretcher to which I told her yes we could. Patient has home Palliative Care in place and was told that they could have someone draw his blood. Can the Retacrit be administered at home? Please advise

## 2020-08-22 NOTE — Telephone Encounter (Addendum)
11 am.  Phone call made to daughter [name] to schedule a home visit for blood work as requested by Natalia Leatherwood, NP.  Message has been left requesting a call back.  Response pending.  144 pm.  Incoming call from [place]. [name] is with her sister and they have questions about blood work and patient care.  Daughters state patient has to be transported via EMS by stretcher.  Patient can be seen by PCP but oncology is not able to accommodate a stretcher. I asked about patient being followed by a provider who does home visits given his homebound status.   Daughters stated they want oncology and PCP to continue to manage patient.  They are asking if patient requires a transfusion or Retacrit how will this be completed and who will get blood results.  I have advised results would be sent to the PCP for review.  [name] stated she wanted to contact Dr. Aletha Halim office to follow up on her questions.  At this time, blood work will not be obtained.  Christin Gusler, NP aware of above.

## 2020-08-23 ENCOUNTER — Telehealth: Payer: Self-pay | Admitting: Hospice and Palliative Medicine

## 2020-08-23 ENCOUNTER — Telehealth: Payer: Self-pay | Admitting: Internal Medicine

## 2020-08-23 NOTE — Telephone Encounter (Signed)
I spoke with patient's daughter.  She says the patient's performance status is quite poor.  He is essentially bedbound and is requiring EMS to transport for medical appointments.  She asked about continuing blood draws, Retacrit and transfusions.  It does not seem likely that we would be able to accommodate an EMS gurney in the cancer center for transfusions.  Ultimately, they would probably have to transport him to the ER.  Instead, we discussed focusing on his quality of life and I recommended consideration of hospice.  Family also want to speak with Dr. Rogue Bussing who plans to call later today.

## 2020-08-23 NOTE — Telephone Encounter (Signed)
Spoke with daughter [name]-discussed overall futility of continued transfusions/Retacrit given patient's ECOG PS 4/bedbound.  Discussed home Retacrit/not covered by insurance.    However, daughter interested in the option of labs/Retacrit in the cancer center.  Patient however will have to be transported in a gurney/EMS.  Will discuss with cancer center adminstration-regarding the feasibility.  Discussed with Dr. Regenia Skeeter.  Will reach out to the family with the decision.  GB

## 2020-08-24 ENCOUNTER — Encounter: Payer: Self-pay | Admitting: Internal Medicine

## 2020-08-25 NOTE — Telephone Encounter (Signed)
Per Dr. Jacinto Reap will talk to [facility] before reaching out to daughter.

## 2020-08-29 ENCOUNTER — Telehealth: Payer: Self-pay | Admitting: Internal Medicine

## 2020-08-29 NOTE — Telephone Encounter (Signed)
On 6/14- I spoke to pt's daughter, Gerald [name] re: difficulty with the logistics/ and the fact that the out pt cancer center is not set up for pt with ECOG-4/ on gurney for labs/ shots/ and PRBC transfusions. Earlier discussed with cancer center director, Ms. Gerald [name]. I again reminded of hospice services. Daughter, reluctant.   Daughter states that she understands the constraints at the cancer center. She states she will reach out to the pt's PCP re: transfusion needs.

## 2020-09-04 ENCOUNTER — Other Ambulatory Visit: Payer: Self-pay

## 2020-09-04 ENCOUNTER — Other Ambulatory Visit: Payer: Medicare Other | Admitting: Nurse Practitioner

## 2020-09-04 ENCOUNTER — Encounter: Payer: Self-pay | Admitting: Nurse Practitioner

## 2020-09-04 DIAGNOSIS — E43 Unspecified severe protein-calorie malnutrition: Secondary | ICD-10-CM

## 2020-09-04 DIAGNOSIS — Z515 Encounter for palliative care: Secondary | ICD-10-CM

## 2020-09-04 NOTE — Progress Notes (Signed)
[facility] Consult Note Telephone: [phone]  Fax: [phone]    Date of encounter: 09/04/20 PATIENT NAME: Amarri G Funches [address] [place] [address]   [phone] (home)  DOB: 16-Mar-1921 MRN: [id number] PRIMARY CARE PROVIDER:    [name], MD,  [address] [place] [address] [phone]  RESPONSIBLE PARTY:    Contact Information     Name Relation Home Work [place] Daughter [phone]  [phone]   Reiss,[name] Daughter [phone]     [facility] Daughter [phone]        Due to the COVID-19 crisis, this visit was done via telemedicine from my office and it was initiated and consent by this patient and or family.  I connected with  [name] on 63/84/66 by a video enabled telemedicine application and verified that I am speaking with the correct person, [name] daughter.   I discussed the limitations of evaluation and management by telemedicine. The patient expressed understanding and agreed to proceed. . Palliative Care was asked to follow this patient by consultation request of  [name], MD to address advance care planning and complex medical decision making. This is a follow up visit.  ASSESSMENT AND PLAN / RECOMMENDATIONS:   Symptom Management/Plan: 1. . Protein calorie malnutrition, continue to encourage to eat, appetite continues to improve. Difficulty to weigh, comfort feeding   3. Fatigue with intermit shortness of breath with exertion secondary to severe debility; continue support, ongoing discussions of disease progression, encourage energy conservation, allow Mr Nakanishi to rest.  Follow up Palliative Care Visit: Palliative care will continue to follow for complex medical decision making, advance care planning, and clarification of goals. Return 1 weeks with phone call after visit with Dr Kary Kos  I spent  32 minutes providing this consultation. More than 50% of the time in this consultation was spent in counseling and care coordination.  PPS: 20%  HOSPICE ELIGIBILITY/DIAGNOSIS: TBD  Chief Complaint: Follow up visit for palliative consult for complex medical decision making  HISTORY OF PRESENT ILLNESS:  KEYLEN ECKENRODE is a 87 y.o. year old male  with multiple medical problems including .Myelodysplastic syndrome, testicular cancer s/p sgy, prostate cancer, history basal cell carcinoma, hemochromatosis, hypertension, anemia, appendectomy. Mr. Barbaro required Hospitalization from 06/30/2020 to 07/01/2020 for symptomatic anemia requiring 2 units to be transfused and erythropoietin to be given. He was then d/c home. I called [name], Mr Ferrara daughter to confirm palliative care follow up visit. [name] requested to switch to telemedicine telephonic is video not available as Mr Ofarrell is sleeping. Changed to telemedicine per request. We talked about how Mr Savino has been feeling. [name] endorses he seems to be doing okay. Family ordered him a reclining geri chair in hopes that will be helpful and more comfortable then the chair he currently has. [name] endorses also hoping that the chair will make Mr Portell more mobile with maneuvering him to take him outside or to another room to visit with family.We talked about symptoms, appetite. Talked about medical goals of care. We talked about recent update Oncology/Cancer Center that would not be able to accommodate stretcher for labs, visit or transfusion, EPO injection. [name] endorses that they are actually scheduled to go to Dr Hedrick's office on Thursday 09/07/2020 to have the horn removed from his forehead. [name] requested in home labs to be drawn by palliative team as previously discussed. We talked about since Mr Sandler is  going to Dr Kary Kos office best plan would be for primary to draw since he will be at the primary office should  he feel it necessary to do. We talked about limitations of lab draws with palliative care. [name] and I talked about when palliative RN called to schedule visit that she was told by daughter [name] that they did not need that and would follow up at the [facility]. Discussed at length with [name] the purpose for palliative RN to visit was for extra support and assistance in the care of Mr Cerney. [name] endorses that that would be okay for her to come. Discussed with [name] will follow up after appointment with Dr Kary Kos to see what options were given. We did talk about Hospice services under Medicare benefit. [name] endorses family is not wishing for Hospice at this time. Family wishes are to continue to treat what is treatable, transfuse an depot injection if needed.   History obtained from review of EMR, discussion with daughter [name] for Mr. Cubit.  I reviewed available labs, medications, imaging, studies and related documents from the EMR.  Records reviewed and summarized above.   ROS Full 14 system review of systems performed and negative with exception of: as per HPI.   Physical Exam: deferred  Questions and concerns were addressed. The daughter was encouraged to call with questions and/or concerns. My contact information was provided. Provided general support and encouragement, no other unmet needs identified   Thank you for the opportunity to participate in the care of Mr. Bachmeier.  The palliative care team will continue to follow. Please call our office at [phone] if we can be of additional assistance.   This chart was dictated using voice recognition software.  Despite best efforts to proofread,  errors can occur which can change the documentation meaning.   Kazuto Sevey Ihor Gully, NP , MSN, [facility]

## 2020-09-08 ENCOUNTER — Telehealth: Payer: Self-pay | Admitting: Nurse Practitioner

## 2020-09-08 NOTE — Telephone Encounter (Signed)
I called Gerald [name], Gerald [name] daugther to check on primary provider visit yesterday with Dr Kary Kos. Gerald [name] endorses the lesion on Gerald [name] forehead was not able to be removed, would need to be dermatologist which transportation is a problem would need to accommodate a stretcher. Gerald [name] endorses they have purchased a reclining chair that may give them more ability to take Gerald [name] to the cancer center and dermatology is he is able to tolerate the chair. Gerald [name] endorses it just came and they will try to put it together tomorrow and see if Gerald [name] is able to tolerate it. We talked about medical goals of care. We talked about lab drawn in the home by [facility] which medical director as a pilot program would not be able to offer this service as following cancer center recommendation. We talked about challenges, risks of transfusions, epo injections at the point Gerald [name] is clinically in. We talked about f/u pc visit. Scheduled. Therapeutic listening, emotional support provided. Questions answered.   Total time 20 minutes Documentation 5 minutes Phone discussion 15 minutes

## 2020-09-20 ENCOUNTER — Telehealth: Payer: Self-pay

## 2020-09-20 ENCOUNTER — Encounter: Payer: Self-pay | Admitting: Dermatology

## 2020-09-20 NOTE — Telephone Encounter (Signed)
Patient's daughter advised of information per Dr. Laurence Ferrari and scheduled for follow up.

## 2020-09-21 ENCOUNTER — Other Ambulatory Visit: Payer: Self-pay

## 2020-09-21 ENCOUNTER — Other Ambulatory Visit: Payer: Medicare Other | Admitting: Nurse Practitioner

## 2020-10-12 ENCOUNTER — Other Ambulatory Visit: Payer: Self-pay

## 2020-10-12 ENCOUNTER — Ambulatory Visit (INDEPENDENT_AMBULATORY_CARE_PROVIDER_SITE_OTHER): Payer: Medicare Other | Admitting: Dermatology

## 2020-10-12 DIAGNOSIS — L578 Other skin changes due to chronic exposure to nonionizing radiation: Secondary | ICD-10-CM | POA: Diagnosis not present

## 2020-10-12 DIAGNOSIS — L570 Actinic keratosis: Secondary | ICD-10-CM | POA: Diagnosis not present

## 2020-10-12 DIAGNOSIS — D044 Carcinoma in situ of skin of scalp and neck: Secondary | ICD-10-CM | POA: Diagnosis not present

## 2020-10-12 DIAGNOSIS — L821 Other seborrheic keratosis: Secondary | ICD-10-CM | POA: Diagnosis not present

## 2020-10-12 DIAGNOSIS — C44329 Squamous cell carcinoma of skin of other parts of face: Secondary | ICD-10-CM

## 2020-10-12 DIAGNOSIS — C44319 Basal cell carcinoma of skin of other parts of face: Secondary | ICD-10-CM | POA: Diagnosis not present

## 2020-10-12 DIAGNOSIS — L820 Inflamed seborrheic keratosis: Secondary | ICD-10-CM | POA: Diagnosis not present

## 2020-10-12 DIAGNOSIS — L814 Other melanin hyperpigmentation: Secondary | ICD-10-CM

## 2020-10-12 DIAGNOSIS — D492 Neoplasm of unspecified behavior of bone, soft tissue, and skin: Secondary | ICD-10-CM

## 2020-10-12 DIAGNOSIS — D485 Neoplasm of uncertain behavior of skin: Secondary | ICD-10-CM

## 2020-10-12 MED ORDER — MUPIROCIN 2 % EX OINT: TOPICAL_OINTMENT | CUTANEOUS | 2 refills | Status: AC

## 2020-10-12 NOTE — Progress Notes (Signed)
Follow-Up Visit   Subjective  Gerald [name] is a 87 y.o. male who presents for the following: Skin Problem (Growth on the forehead x 2 months growing, ).  Daughters with patient with history   [name] and Jonelle Sidle   The following portions of the chart were reviewed this encounter and updated as appropriate:   Tobacco  Allergies  Meds  Problems  Med Hx  Surg Hx  Fam Hx      Review of Systems:  No other skin or systemic complaints except as noted in HPI or Assessment and Plan.  Objective  Well appearing patient in no apparent distress; mood and affect are within normal limits.  A focused examination was performed including face. Relevant physical exam findings are noted in the Assessment and Plan.  forehead 3.9 cm large firm nodule         Left Upper Arm - Anterior Erythematous keratotic or waxy stuck-on papule or plaque.   left doral hand, left forearm, right dorsal hand, mid vertex scalp (4) Erythematous thin papules/macules with gritty scale.   left alar crease 0.9 cm pearly papule      right vertex scalp 1.0 cm scaly pink plaque        Assessment & Plan  Neoplasm of uncertain behavior of skin forehead  Skin / nail biopsy Type of biopsy: tangential   Informed consent: discussed and consent obtained   Patient was prepped and draped in usual sterile fashion: area prepped with alochol. Anesthesia: the lesion was anesthetized in a standard fashion   Anesthetic:  1% lidocaine w/ epinephrine 1-100,000 buffered w/ 8.4% NaHCO3 Instrument used: flexible razor blade   Hemostasis achieved with: pressure, aluminum chloride and electrodesiccation   Outcome: patient tolerated procedure well   Post-procedure details: wound care instructions given   Post-procedure details comment:  Ointment and small bandage Additional details:  STAT   Specimen 3 - Surgical pathology Differential Diagnosis: R/O SCC  Check Margins: No  R/O SCC   Discussed treatment  options EDC, Radiation or Mohs. They would like it removed as quickly as possible but also to avoid multiple procedures and minimize any difficulty with bleeding or wound healing. Radiation is not a good option as it is hard to take him places.  Recommend STAT biopsy  Once we receive pathology results we will place Urgent Mohs referral at [place].   Related Medications mupirocin ointment (BACTROBAN) 2 % Apply to skin qd-bid during each bandage change  Inflamed seborrheic keratosis Left Upper Arm - Anterior  Symptomatic  Prior to procedure, discussed risks of blister formation, small wound, skin dyspigmentation, or rare scar following cryotherapy. Recommend Vaseline ointment to treated areas while healing.   Related Medications mupirocin ointment (BACTROBAN) 2 % Apply to skin qd-bid during each bandage change  AK (actinic keratosis) (4) left doral hand, left forearm, right dorsal hand, mid vertex scalp  Hypertrophic Aks  Prior to procedure, discussed risks of blister formation, small wound, skin dyspigmentation, or rare scar following cryotherapy. Recommend Vaseline ointment to treated areas while healing.  Actinic keratoses are precancerous spots that appear secondary to cumulative UV radiation exposure/sun exposure over time. They are chronic with expected duration over 1 year. A portion of actinic keratoses will progress to squamous cell carcinoma of the skin. It is not possible to reliably predict which spots will progress to skin cancer and so treatment is recommended to prevent development of skin cancer.  Recommend daily broad spectrum sunscreen SPF 30+ to sun-exposed areas,  reapply every 2 hours as needed.  Recommend staying in the shade or wearing long sleeves, sun glasses (UVA+UVB protection) and wide brim hats (4-inch brim around the entire circumference of the hat). Call for new or changing lesions.   Destruction of lesion - left doral hand, left forearm,  right dorsal hand, mid vertex scalp Complexity: simple   Destruction method: cryotherapy   Informed consent: discussed and consent obtained   Timeout:  patient name, date of birth, surgical site, and procedure verified Lesion destroyed using liquid nitrogen: Yes   Region frozen until ice ball extended beyond lesion: Yes   Outcome: patient tolerated procedure well with no complications   Post-procedure details: wound care instructions given    Related Medications mupirocin ointment (BACTROBAN) 2 % Apply to skin qd-bid during each bandage change  Neoplasm of skin (2) left alar crease  Skin / nail biopsy Type of biopsy: tangential   Informed consent: discussed and consent obtained   Patient was prepped and draped in usual sterile fashion: area prepped with alochol. Anesthesia: the lesion was anesthetized in a standard fashion   Anesthetic:  1% lidocaine w/ epinephrine 1-100,000 buffered w/ 8.4% NaHCO3 Instrument used: flexible razor blade   Hemostasis achieved with: pressure, aluminum chloride and electrodesiccation   Outcome: patient tolerated procedure well   Post-procedure details: wound care instructions given   Post-procedure details comment:  Ointment and small bandage  Destruction of lesion  Destruction method: electrodesiccation and curettage   Informed consent: discussed and consent obtained   Timeout:  patient name, date of birth, surgical site, and procedure verified Anesthesia: the lesion was anesthetized in a standard fashion   Anesthetic:  1% lidocaine w/ epinephrine 1-100,000 buffered w/ 8.4% NaHCO3 Curettage performed in three different directions: Yes   Electrodesiccation performed over the curetted area: Yes   Curettage cycles:  3 Final wound size (cm):  1.2 Hemostasis achieved with:  electrodesiccation Outcome: patient tolerated procedure well with no complications   Post-procedure details: sterile dressing applied and wound care instructions given    Dressing type: petrolatum    Specimen 1 - Surgical pathology Differential Diagnosis: R/O  BCC  Check Margins: No  right vertex scalp  Skin / nail biopsy Type of biopsy: tangential   Informed consent: discussed and consent obtained   Patient was prepped and draped in usual sterile fashion: area prepped with alochol. Anesthesia: the lesion was anesthetized in a standard fashion   Anesthetic:  1% lidocaine w/ epinephrine 1-100,000 buffered w/ 8.4% NaHCO3 Instrument used: flexible razor blade   Hemostasis achieved with: pressure, aluminum chloride and electrodesiccation   Outcome: patient tolerated procedure well   Post-procedure details: wound care instructions given   Post-procedure details comment:  Ointment and small bandage  Destruction of lesion  Destruction method: electrodesiccation and curettage   Informed consent: discussed and consent obtained   Timeout:  patient name, date of birth, surgical site, and procedure verified Anesthesia: the lesion was anesthetized in a standard fashion   Anesthetic:  1% lidocaine w/ epinephrine 1-100,000 buffered w/ 8.4% NaHCO3 Curettage performed in three different directions: Yes   Electrodesiccation performed over the curetted area: Yes   Curettage cycles:  3 Final wound size (cm):  1.5 Hemostasis achieved with:  electrodesiccation Outcome: patient tolerated procedure well with no complications   Post-procedure details: sterile dressing applied and wound care instructions given   Dressing type: petrolatum    Specimen 2 - Surgical pathology Differential Diagnosis:  R/O  BCC   Check Margins: No  Related Medications mupirocin ointment (BACTROBAN) 2 % Apply to skin qd-bid during each bandage change  Seborrheic Keratoses - Stuck-on, waxy, tan-brown papules and/or plaques  - Benign-appearing - Discussed benign etiology and prognosis. - Observe - Call for any changes  Actinic Damage - chronic, secondary to cumulative UV  radiation exposure/sun exposure over time - diffuse scaly erythematous macules with underlying dyspigmentation - Recommend daily broad spectrum sunscreen SPF 30+ to sun-exposed areas, reapply every 2 hours as needed.  - Recommend staying in the shade or wearing long sleeves, sun glasses (UVA+UVB protection) and wide brim hats (4-inch brim around the entire circumference of the hat). - Call for new or changing lesions.  Lentigines - Scattered tan macules - Due to sun exposure - Benign-appering, observe - Recommend daily broad spectrum sunscreen SPF 30+ to sun-exposed areas, reapply every 2 hours as needed. - Call for any changes   Return if symptoms worsen or fail to improve.  I, Marye Round, CMA, am acting as scribe for Forest Gleason, MD .   Documentation: I have reviewed the above documentation for accuracy and completeness, and I agree with the above.  Forest Gleason, MD

## 2020-10-12 NOTE — Patient Instructions (Addendum)
Cryotherapy Aftercare  Wash gently with soap and water everyday.   Apply Vaseline and Band-Aid daily until healed.     Wound Care Instructions  Cleanse wound gently with soap and water once a day then pat dry with clean gauze. Apply a thing coat of Mupirocin ointment over the wound (unless you have an allergy to this) . Do not pick or remove scabs. Do not remove the yellow or white "healing tissue" from the base of the wound.  Cover the wound with fresh, clean, nonstick gauze and secure with paper tape. You may use Band-Aids in place of gauze and tape if the would is small enough, but would recommend trimming much of the tape off as there is often too much. Sometimes Band-Aids can irritate the skin.  You should call the office for your biopsy report after 1 week if you have not already been contacted.  If you experience any problems, such as abnormal amounts of bleeding, swelling, significant bruising, significant pain, or evidence of infection, please call the office immediately.  FOR ADULT SURGERY PATIENTS: If you need something for pain relief you may take 1 extra strength Tylenol (acetaminophen) AND 2 Ibuprofen ('200mg'$  each) together every 4 hours as needed for pain. (do not take these if you are allergic to them or if you have a reason you should not take them.) Typically, you may only need pain medication for 1 to 3 days.         If you have any questions or concerns for your doctor, please call our main line at [phone] and press option 4 to reach your doctor's medical assistant. If no one answers, please leave a voicemail as directed and we will return your call as soon as possible. Messages left after 4 pm will be answered the following business day.   You may also send [place] a message via [place]. We typically respond to MyChart messages within 1-2 business days.  For prescription refills, please ask your pharmacy to contact our office. Our fax number is [phone].  If you  have an urgent issue when the clinic is closed that cannot wait until the next business day, you can page your doctor at the number below.    Please note that while we do our best to be available for urgent issues outside of office hours, we are not available 24/7.   If you have an urgent issue and are unable to reach [place], you may choose to seek medical care at your doctor's office, retail clinic, urgent care center, or emergency room.  If you have a medical emergency, please immediately call 911 or go to the emergency department.  Pager Numbers  - Dr. Nehemiah Massed: [phone]  - Dr. Laurence Ferrari: [phone]  - Dr. Nicole Kindred: [phone]  In the event of inclement weather, please call our main line at [phone] for an update on the status of any delays or closures.  Dermatology Medication Tips: Please keep the boxes that topical medications come in in order to help keep track of the instructions about where and how to use these. Pharmacies typically print the medication instructions only on the boxes and not directly on the medication tubes.   If your medication is too expensive, please contact our office at [phone] option 4 or send [place] a message through [place].   We are unable to tell what your co-pay for medications will be in advance as this is different depending on your insurance coverage. However, we may be able to find  a substitute medication at lower cost or fill out paperwork to get insurance to cover a needed medication.   If a prior authorization is required to get your medication covered by your insurance company, please allow [place] 1-2 business days to complete this process.  Drug prices often vary depending on where the prescription is filled and some pharmacies may offer cheaper prices.  The website www.goodrx.com contains coupons for medications through different pharmacies. The prices here do not account for what the cost may be with help from insurance (it may be cheaper  with your insurance), but the website can give you the price if you did not use any insurance.  - You can print the associated coupon and take it with your prescription to the pharmacy.  - You may also stop by our office during regular business hours and pick up a GoodRx coupon card.  - If you need your prescription sent electronically to a different pharmacy, notify our office through [facility] or by phone at [phone] option 4.

## 2020-10-17 ENCOUNTER — Encounter: Payer: Self-pay | Admitting: Dermatology

## 2020-10-17 ENCOUNTER — Telehealth: Payer: Self-pay

## 2020-10-17 DIAGNOSIS — C44329 Squamous cell carcinoma of skin of other parts of face: Secondary | ICD-10-CM

## 2020-10-17 NOTE — Telephone Encounter (Signed)
-----   Message from Alfonso Patten, MD sent at 10/17/2020 10:05 AM EDT ----- 1. Skin , left alar crease BASAL CELL CARCINOMA, NODULAR PATTERN --> already treated with ED&C  2. Skin , right vertex scalp SQUAMOUS CELL CARCINOMA IN SITU, DIGITATED --> already treated with ED&C  3. Skin , forehead MODERATELY DIFFERENTIATED SQUAMOUS CELL CARCINOMA --> urgent referral for Mohs skin surgery center.   MAs please call daughter with update. If no answer, please go ahead and place urgent referral for [facility] even if you have not spoken with family (we discussed this last week). Thank you!

## 2020-10-17 NOTE — Telephone Encounter (Signed)
Patient's daughter advised of BX results.  MOHS referral sent to [facility] as URG[facility].

## 2020-11-08 DIAGNOSIS — C4492 Squamous cell carcinoma of skin, unspecified: Secondary | ICD-10-CM

## 2020-11-08 HISTORY — DX: Squamous cell carcinoma of skin, unspecified: C44.92

## 2020-11-23 ENCOUNTER — Other Ambulatory Visit: Payer: Self-pay

## 2020-11-23 ENCOUNTER — Other Ambulatory Visit: Payer: Medicare Other | Admitting: Nurse Practitioner

## 2020-11-23 ENCOUNTER — Encounter: Payer: Self-pay | Admitting: Nurse Practitioner

## 2020-11-23 DIAGNOSIS — Z515 Encounter for palliative care: Secondary | ICD-10-CM

## 2020-11-23 DIAGNOSIS — E43 Unspecified severe protein-calorie malnutrition: Secondary | ICD-10-CM

## 2020-11-23 NOTE — Progress Notes (Signed)
[facility] Consult Note Telephone: [phone]  Fax: [phone]    Date of encounter: 11/23/20 1:57 PM PATIENT NAME: Gerald [name] [address] [place] [address]   [phone] (home)  DOB: March 04, 1921 MRN: [id number] PRIMARY CARE PROVIDER:    [name], MD,  [address] [place] [address] [phone]  RESPONSIBLE PARTY:    Contact Information     Name Relation Home Work [place] Daughter [phone]  [phone]   Reiss,[name] Daughter [phone]     [facility] Daughter [phone]        Due to the COVID-19 crisis, this visit was done via telemedicine from my office and it was initiated and consent by this patient and or family.  I connected with  JAZMIN GRADILLAS daughters [name] and [name] on 11/23/20 by a phone as video not available  enabled telemedicine application and verified that I am speaking with the correct person..   I discussed the limitations of evaluation and management by telemedicine. The patient expressed understanding and agreed to proceed. This is a follow up visit.                                  ASSESSMENT AND PLAN / RECOMMENDATIONS: Symptom Management/Plan: 1. . Protein calorie malnutrition, continue to encourage to eat, appetite continues to improve. Difficulty to weigh, comfort feeding   3. Fatigue with intermit shortness of breath with exertion secondary to severe debility; continue support, ongoing discussions of disease progression, encourage energy conservation, allow Mr Ladzinski to rest.  4. Toe nail tear: toe nail that came off when became caught on the chair bandage and wound care. Discussed symptoms of infection which it does not sound like at this time. We talked about wound care to continue, monitor s/s infections. Discussed if occurs contact primary or pc for f/u visit possible need for abx. [name] in  agreement.  Follow up Palliative Care Visit: Palliative care will continue to follow for complex medical decision making, advance care planning, and clarification of goals. Return 6 weeks or prn.  I spent 41 minutes providing this consultation. More than 50% of the time in this consultation was spent in counseling and care coordination.  PPS: 30%  Chief Complaint: Follow up Palliative consult complex medical decision making   HISTORY OF PRESENT ILLNESS:  Gerald [name] is a 87 y.o. year old male  with multiple medical problems including .Myelodysplastic syndrome, testicular cancer s/p sgy, prostate cancer, history basal cell carcinoma, hemochromatosis, hypertension, anemia, appendectomy. I called [name] with [name] daughters with Gerald [name] by phone for telemedicine telephonic as video not available. We talked about pc visit, [name] agree. We talked about how Gerald [name] has been doing. [name] and [place] share he has been doing little better. He had Mohn's procedure for Skin , left alar crease BASAL CELL CARCINOMA, NODULAR PATTERN --> already treated with ED&C; Skin , right vertex scalp SQUAMOUS CELL CARCINOMA IN SITU, DIGITATED --> already treated with ED&C; Skin , forehead MODERATELY DIFFERENTIATED SQUAMOUS CELL CARCINOMA --> urgent referral for Mohs skin surgery center. We talked about Gerald [name] transport w/c they purchased which has help making transporting Gerald [name] much easier. [name] endorses Mr Granger appears more comfortable. [name]/Lillian share Mr. Quispe continues to sleep a lot, they have been letting sleep long in the am. When activity tires him. We talked  about symptoms of pain, lower back currently taking tramadol at night which seems to help him rest better. [name] endorses appears sacral wound healed, some intermit purplish color. We talked about appetite as family is feeding Mr. Acre. At times he will eat finger foods feeding himself. We talked  about nutrition. [name] endorses it does not appear Gerald [name] has lost any more weight, no changes in clothes size. We talked about medical goals, continue current plan. We talked about total care functionally. [name] endorses cognitively Gerald [name] is not as interactive socially, does not talk as much. Fatigue with intermit shortness of breath with exertion secondary to severe debility; continue support, ongoing discussions of disease progression, encourage energy conservation, allow Mr [name] to rest. We talked about family support, dynamics. We talked about Gerald [name] toe nail that came off when became caught on the chair bandage and wound care. Discussed symptoms of infection which it does not sound like at this time. We talked about wound care to continue, monitor s/s infections. Discussed if occurs contact primary or pc for f/u visit possible need for abx. [name] in agreement. We talked about f/u pc visit, scheduled. Therapeutic listening, emotional support provided. Questions answered.   History obtained from review of EMR, discussion with daughters [name] and [name] with Mr. Cavness.  I reviewed available labs, medications, imaging, studies and related documents from the EMR.  Records reviewed and summarized above.   ROS Full 14 system review of systems performed and negative with exception of: as per HPI.   Physical Exam: deferred  Questions and concerns were addressed. The patient/family was encouraged to call with questions and/or concerns. My contact information was provided. Provided general support and encouragement, no other unmet needs identified   Thank you for the opportunity to participate in the care of Gerald [name].  The palliative care team will continue to follow. Please call our office at [phone] if we can be of additional assistance.   This chart was dictated using voice recognition software.  Despite best efforts to proofread,  errors can occur  which can change the documentation meaning.   Keishawn Darsey Ihor Gully, NP

## 2021-01-09 ENCOUNTER — Encounter: Payer: Self-pay | Admitting: Nurse Practitioner

## 2021-01-09 ENCOUNTER — Other Ambulatory Visit: Payer: Self-pay

## 2021-01-09 ENCOUNTER — Other Ambulatory Visit: Payer: Medicare Other | Admitting: Nurse Practitioner

## 2021-01-09 DIAGNOSIS — G8929 Other chronic pain: Secondary | ICD-10-CM

## 2021-01-09 DIAGNOSIS — D469 Myelodysplastic syndrome, unspecified: Secondary | ICD-10-CM

## 2021-01-09 DIAGNOSIS — E43 Unspecified severe protein-calorie malnutrition: Secondary | ICD-10-CM

## 2021-01-09 DIAGNOSIS — Z515 Encounter for palliative care: Secondary | ICD-10-CM

## 2021-01-09 NOTE — Progress Notes (Signed)
  [facility] Consult Note Telephone: [phone]  Fax: [phone]    Date of encounter: 01/09/21 5:32 PM PATIENT NAME: Gerald [name] [address] [place] [address]   [phone] (home)  DOB: 10/17/1920 MRN: [id number] PRIMARY CARE PROVIDER:    Hedrick, James, MD,  [address] [facility] [place] [address] [phone]  RESPONSIBLE PARTY:    Contact Information     Name Relation Home Work Mobile   Franklin,Lilian P Daughter [phone]  [phone]   Reiss,[name] Daughter [phone]     Hill,Mary Daughter [phone]        I met face to face with patient and family in home. Palliative Care was asked to follow this patient by consultation request of  Hedrick, James, MD to address advance care planning and complex medical decision making. This is a follow up visit.                                  ASSESSMENT AND PLAN / RECOMMENDATIONS:  Symptom Management/Plan: ACP; DNR; Lillian in agreement to have Marla Minoso from [facility] to do a discussion on Hospice services.   Pain secondary to chronic back pain and very small sacral wound.  MRI did not reveal acute fx, continue to monitor on pain scale. Continue to use current regimen   3. Protein calorie malnutrition secondary to MDS continue to encourage to eat, appetite continues to improve. Difficulty to weigh, comfort feeding   4. Fatigue with intermit shortness of breath with exertion secondary to severe debility; continue support, ongoing discussions of disease progression, encourage energy conservation, allow Mr Armentrout to rest.  5. Palliative care encounter; Palliative care encounter; Palliative medicine team will continue to support patient, patient's family, and medical team. Visit consisted of counseling and education dealing with the complex and emotionally intense issues of symptom management and palliative care in the setting of  serious and potentially life-threatening illness  Follow up Palliative Care Visit: Palliative care will continue to follow for complex medical decision making, advance care planning, and clarification of goals. Return 8 weeks or prn.  I spent 61 minutes providing this consultation. More than 50% of the time in this consultation was spent in counseling and care coordination. PPS: 30%  Chief Complaint: Follow up palliative consult for complex medical decision making  HISTORY OF PRESENT ILLNESS:  Gerald [name] is a 87 y.o. year old male  with multiple medical problems including Myelodysplastic syndrome, testicular cancer s/p sgy, prostate cancer, history basal cell carcinoma, hemochromatosis, hypertension, anemia, appendectomy. I called Lillian to confirm PC visit. I visited Lillian and Mr. Weingartner in their home. Mr. Mable was sitting in his recliner while Lillian has been feeding him. Mr. Kloster does continue to cough, get strangled while eating. Lillian endorses she has been putting thicket in his tea. We talked about nutrition, appetite, symptoms, wound. We talked about overall functional ability, cognitive, chronic disease. We talked about having horn removed, using his w/c for transport. We talked about medical goals. We talked about option of having Marla Minoso to contact for informational session concerning Hospice as [name] has been eligible for services, though family was not ready. Lillian in agreement for Marla to come to the home. We talked about role pc in poc. Therapeutic listening, emotional support provided. Questions answered.   History obtained from review of EMR, discussion with Daughter, Lillian and    Mr. Steinberger.  I reviewed available labs, medications, imaging, studies and related documents from the EMR.  Records reviewed and summarized above.   ROS Full 10 system review of systems performed and negative with exception of: as per HPI.   Physical  Exam: Constitutional: NAD General: frail appearing, thin, debilitated, chronically ill, elderly male EYES: lids intact ENMT: oral mucous membranes moist CV: S1S2, RRR, Pulmonary: LCTA, no increased work of breathing, no cough, room air Abdomen: i normo-active BS + 4 quadrants, soft and non tender, MSK: bed-bound; lift to chair Skin: warm and dry; sacral wound; muscle wasting Neuro:  + generalized weakness,  + cognitive impairment Psych: flat affect, Alert  Questions and concerns were addressed. The patient/family was encouraged to call with questions and/or concerns.  Provided general support and encouragement, no other unmet needs identified   Thank you for the opportunity to participate in the care of [name].  The palliative care team will continue to follow. Please call our office at [phone] if we can be of additional assistance.   This chart was dictated using voice recognition software.  Despite best efforts to proofread,  errors can occur which can change the documentation meaning.    Z , NP   COVID-19 PATIENT SCREENING TOOL Asked and negative response unless otherwise noted:   Have you had symptoms of covid, tested positive or been in contact with someone with symptoms/positive test in the past 5-10 days? NO  

## 2021-03-07 ENCOUNTER — Encounter: Payer: Self-pay | Admitting: Nurse Practitioner

## 2021-03-07 ENCOUNTER — Other Ambulatory Visit: Payer: Self-pay

## 2021-03-07 ENCOUNTER — Other Ambulatory Visit: Payer: Medicare Other | Admitting: Nurse Practitioner

## 2021-03-07 DIAGNOSIS — Z515 Encounter for palliative care: Secondary | ICD-10-CM

## 2021-03-07 DIAGNOSIS — E43 Unspecified severe protein-calorie malnutrition: Secondary | ICD-10-CM

## 2021-03-07 DIAGNOSIS — D469 Myelodysplastic syndrome, unspecified: Secondary | ICD-10-CM

## 2021-03-07 NOTE — Progress Notes (Signed)
Gerald Consult Note Telephone: [phone]  Fax: [phone]    Date of encounter: 03/07/21 10:43 AM PATIENT NAME: Gerald [name] [address] [place] [address]   [phone] (home)  DOB: 1920/12/27 MRN: [id number] PRIMARY CARE PROVIDER:    [name], MD,  [address] [place] [address] [phone]  RESPONSIBLE PARTY:    Contact Information     Name Relation Home Work [place] Daughter [phone]  [phone]   Reiss,[name] Daughter [phone]     [facility] Daughter [phone]        Due to the COVID-19 crisis, this visit was done via telemedicine from my office and it was initiated and consent by this patient and or family.  I connected with  Gerald [name], daughters with Gerald [name] OR PROXY on 03/07/21 by a telephone as video not available telemedicine application and verified that I am speaking with the correct person using two identifiers.   I discussed the limitations of evaluation and management by telemedicine. The patient expressed understanding and agreed to proceed.  Palliative Care was asked to follow this patient by consultation request of  Gerald Pink, MD to address advance care planning and complex medical decision making. This is a follow up visit ASSESSMENT AND PLAN / RECOMMENDATIONS: Symptom Management/Plan: ACP; DNR; Discussed at length about Gerald [name]. Gerald [name] and Gerald [name] endorses family is considering Hospice services after Christmas. Discussed will schedule f/u visit for further discussion of Hospice as Gerald [name] is Hospice eligibile per [facility].  2. Protein calorie malnutrition, continue to encourage to eat, appetite continues to improve. Difficulty to weigh, comfort feeding   3. Fatigue with intermit shortness of breath with exertion secondary to severe debility; continue support,  ongoing discussions of disease progression, encourage energy conservation, allow Gerald [name] to rest.  Follow up Palliative Care Visit: Palliative care will continue to follow for complex medical decision making, advance care planning, and clarification of goals. Return 2 weeks or prn.  I spent 27 minutes providing this consultation. More than 50% of the time in this consultation was spent in counseling and care coordination.  PPS: 30%  HOSPICE ELIGIBILITY/DIAGNOSIS: yes per Hospice physicians  Chief Complaint: Follow up palliative consult for complex medical decision making  HISTORY OF PRESENT ILLNESS:  Gerald [name] is a 87 y.o. year old male  with multiple medical problems including .Myelodysplastic syndrome, testicular cancer s/p sgy, prostate cancer, history basal cell carcinoma, hemochromatosis, hypertension, anemia, appendectomy. I called Gerald [name] with Gerald [name] daughters with Gerald [name] by phone for telemedicine telephonic as video not available.  We talked about how Gerald [name] has been doing, symptoms, appetite, sacral redness. We talked about sleeping patterns, loose stools which they stopped or regulate the stool softener. We talked about red rash where lidoderm patch was which they have stopped using. We talked about dermatitis vs a burn from description. Gerald [name] endorses it has been getting better. We talked about medical goals. We talked about Hospice. Gerald [name] endorses will f/u after Christmas with further discussion of Hospice. We talked about role pc in poc. Therapeutic listening, emotional support provided. Questions answered. Appointment scheduled  History obtained from review of EMR, discussion with Gerald [name] with Gerald [name].  I reviewed available labs, medications, imaging, studies and related documents from the EMR.  Records reviewed and summarized above.   ROS Full 10 system review of systems performed and negative with exception of: as  per HPI.    Physical Exam: deferred Questions and concerns were addressed. The patient/family was encouraged to call with questions and/or concerns. My contact information was provided. Provided general support and encouragement, no other unmet needs identified   Thank you for the opportunity to participate in the care of Gerald [name].  The palliative care team will continue to follow. Please call our office at [phone] if we can be of additional assistance.   This chart was dictated using voice recognition software.  Despite best efforts to proofread,  errors can occur which can change the documentation meaning.   Gerald Karis Ihor Gully, NP

## 2021-03-21 ENCOUNTER — Other Ambulatory Visit: Payer: Medicare Other | Admitting: Nurse Practitioner

## 2021-03-21 ENCOUNTER — Encounter: Payer: Self-pay | Admitting: Nurse Practitioner

## 2021-03-21 ENCOUNTER — Other Ambulatory Visit: Payer: Self-pay

## 2021-03-21 DIAGNOSIS — G8929 Other chronic pain: Secondary | ICD-10-CM

## 2021-03-21 DIAGNOSIS — Z515 Encounter for palliative care: Secondary | ICD-10-CM

## 2021-03-21 DIAGNOSIS — E43 Unspecified severe protein-calorie malnutrition: Secondary | ICD-10-CM

## 2021-03-21 DIAGNOSIS — D469 Myelodysplastic syndrome, unspecified: Secondary | ICD-10-CM

## 2021-03-21 NOTE — Progress Notes (Signed)
Gerald [name] Consult Note Telephone: [phone]  Fax: [phone]    Date of encounter: 03/21/21 2:45 PM PATIENT NAME: Gerald [name] [address] [place] [address]   [phone] (home)  DOB: [birth year] MRN: [id number] PRIMARY CARE PROVIDER:    [name], MD,  [address] [place] [address] [phone]  RESPONSIBLE PARTY:    Contact Information     Name Relation Home Work Gerald [name] Daughter [phone]  [phone]   Reiss,[name] Daughter [phone]     Gerald [name] Daughter [phone]        Due to the COVID-19 crisis, this visit was done via telemedicine from my office and it was initiated and consent by this patient and or family.  I connected with daughters Gerald [name] with Gerald [name] and  Gerald [name] OR PROXY on 03/21/21 by a video enabled telemedicine application and verified that I am speaking with the correct person using two identifiers.   I discussed the limitations of evaluation and management by telemedicine. The patient expressed understanding and agreed to proceed. Palliative Care was asked to follow this patient by consultation request of  Gerald Pink, MD to address advance care planning and complex medical decision making. This is a follow up visit.                                  ASSESSMENT AND PLAN / RECOMMENDATIONS:  Symptom Management/Plan ACP; DNR; Gerald [name] in agreement to hospice; contacted Gerald [name] office for Hospice order   Pain secondary to chronic back pain and very small sacral wound.  MRI did not reveal acute fx, continue to monitor on pain scale. Continue to use current regimen   3. Protein calorie malnutrition secondary to MDS continue to encourage to eat, appetite continues to improve. Difficulty to weigh, comfort feeding   4. Fatigue with intermit shortness of breath with exertion secondary to severe debility;  continue support, ongoing discussions of disease progression, encourage energy conservation, allow Mr Blyden to rest.   5. Palliative care encounter; Palliative care encounter; Palliative medicine team will continue to support patient, patient's family, and medical team. Visit consisted of counseling and education dealing with the complex and emotionally intense issues of symptom management and palliative care in the setting of serious and potentially life-threatening illness  I spent 26 minutes providing this consultation. More than 50% of the time in this consultation was spent in counseling and care coordination PPS: 30%  HOSPICE ELIGIBILITY/DIAGNOSIS: yes per [facility]  Chief Complaint: Follow up palliative consult for complex medical decision making  HISTORY OF PRESENT ILLNESS:  [name] is a 88 y.o. year old male  with multiple medical problems including Myelodysplastic syndrome, testicular cancer s/p sgy, prostate cancer, history basal cell carcinoma, hemochromatosis, hypertension, anemia, appendectomy. I called Gerald [name], Gerald [name] daughters with Gerald [name] for telemedicine visit by telephone as video not available. We talked about how Gerald [name] has been feeling. Gerald [name] endorses no further changes functionally, cognitively. Gerald [name] continues to sleep a lot. We talked symptoms, ros, appetite, wound, daily routine, medical goals, reviewed option of Hospice services. Gerald [name] and Gerald [name] in agreement to proceed with Hospice. I contacted Gerald [name] for Hospice order.   History obtained from review of EMR, discussion with Gerald [name] and Gerald [name] daughters with Gerald [name].  I reviewed available labs, medications, imaging, studies and  related documents from the EMR.  Records reviewed and summarized above.   ROS Reviewed 10 point system all negative except HPI  Physical Exam: deferred  Thank you for the opportunity to participate in the care  of [name].  The palliative care team will continue to follow. Please call our office at [phone] if we can be of additional assistance.   This chart was dictated using voice recognition software.  Despite best efforts to proofread,  errors can occur which can change the documentation meaning.   Questions and concerns were addressed. The patient/family was encouraged to call with questions and/or concerns. My contact information was provided. Provided general support and encouragement, no other unmet needs identified   Amandy Chubbuck Ihor Gully, NP

## 2021-03-22 ENCOUNTER — Telehealth: Payer: Self-pay | Admitting: Nurse Practitioner

## 2021-03-22 NOTE — Telephone Encounter (Signed)
I received order from Dr Kary Kos office for Hospice services and Dr Kary Kos in agreement to remain attending

## 2021-05-05 IMAGING — MR MR LUMBAR SPINE W/O CM
4 of 5 series · 30 of 48 positions shown · non-contrast
Comparison: MRI 12/20/2019, CT 06/12/2020

CLINICAL DATA: Low back pain. History of T12 kyphoplasty in Tuesday December, 2019

EXAM:
MRI LUMBAR SPINE WITHOUT CONTRAST
TECHNIQUE: Multiplanar, multisequence MR imaging of the lumbar spine was
performed. No intravenous contrast was administered.

[Series 6: T2 · sagittal · 4.0mm · 0.88mm/px · 6 of 17 slices shown (1 of 2)]
[im 1/17]
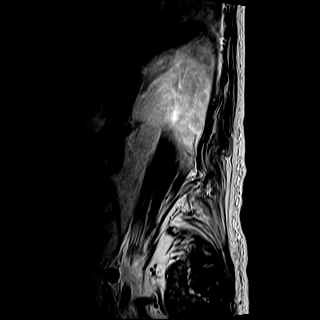
[im 4/17]
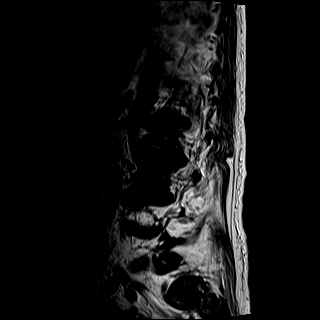
[im 7/17]
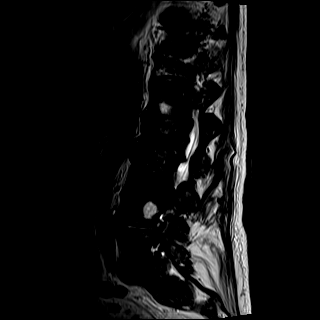
[im 10/17]
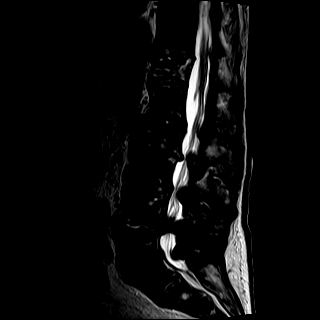
[im 13/17]
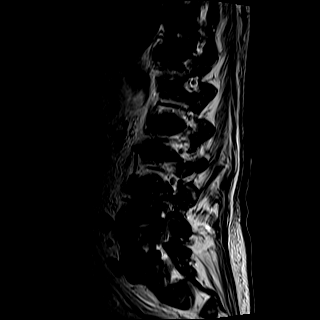
[im 17/17]
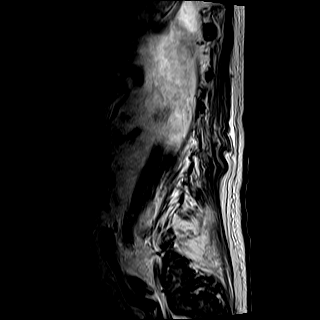

[Series 7: T1 · sagittal · 4.0mm · 0.88mm/px · 6 of 17 slices shown (1 of 2)]
[im 1/17]
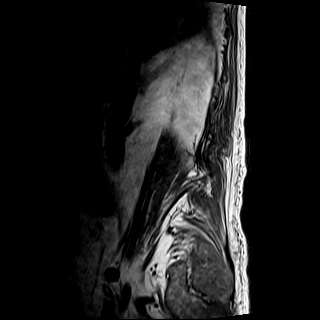
[im 4/17]
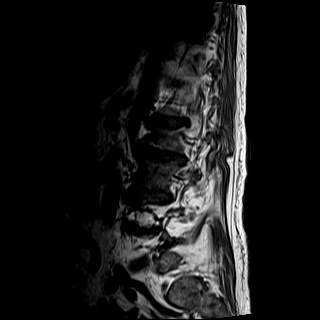
[im 7/17]
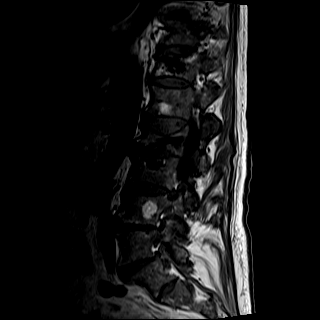
[im 10/17]
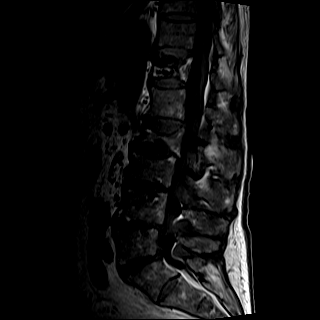
[im 13/17]
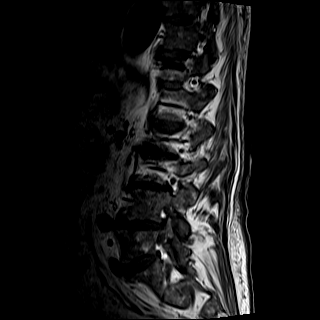
[im 17/17]
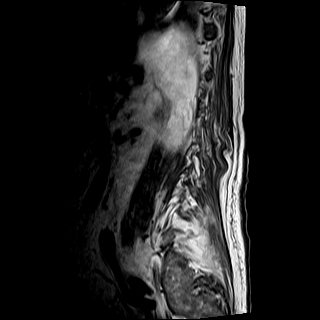

[Series 9: T2 · axial · 4.0mm · 0.78mm/px · z∈[-166,+80]mm · 9 of 42 slices shown (2 of 2)]
[im 1/42]
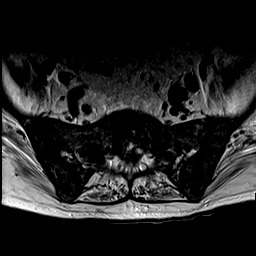
[im 6/42]
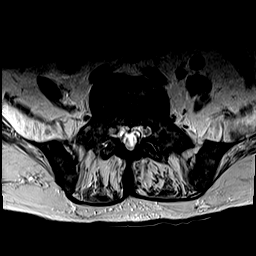
[im 12/42]
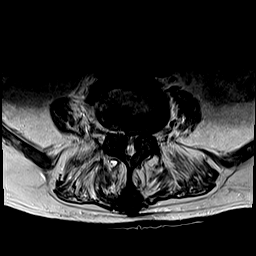
[im 18/42]
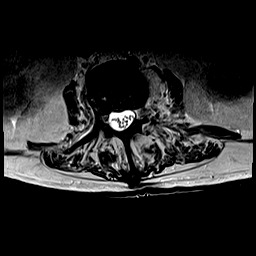
[im 21/42]
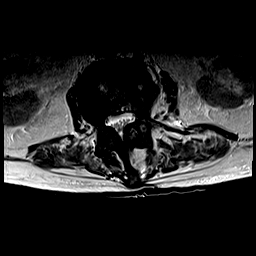
[im 24/42]
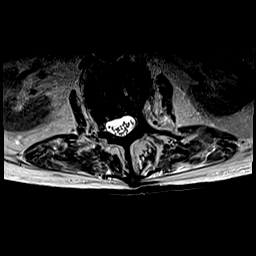
[im 30/42]
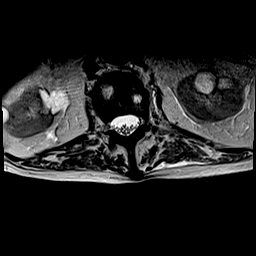
[im 36/42]
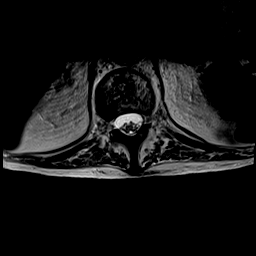
[im 42/42]
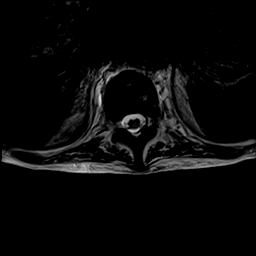

[Series 10: T1 · axial · 4.0mm · 0.39mm/px · z∈[-166,+80]mm · 9 of 42 slices shown (2 of 2)]
[im 1/42]
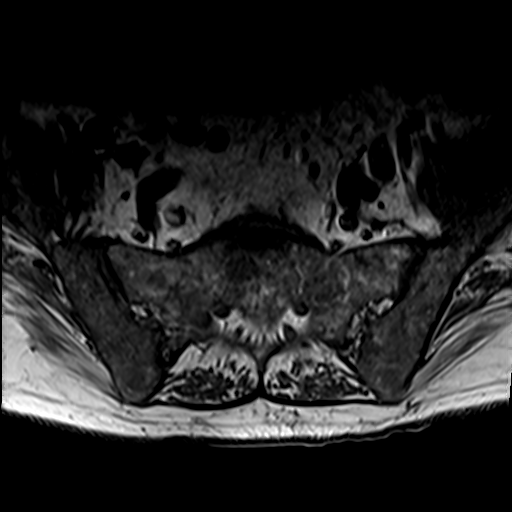
[im 6/42]
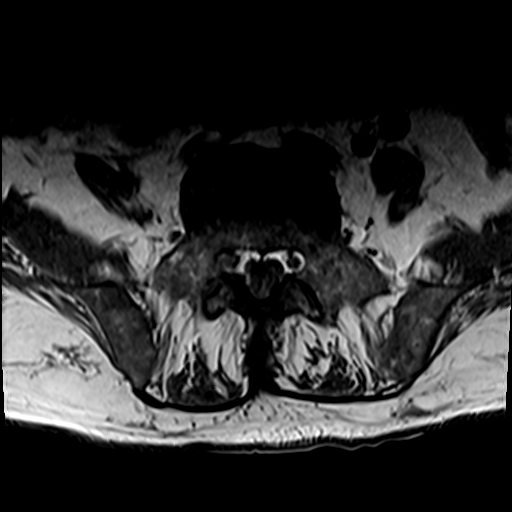
[im 12/42]
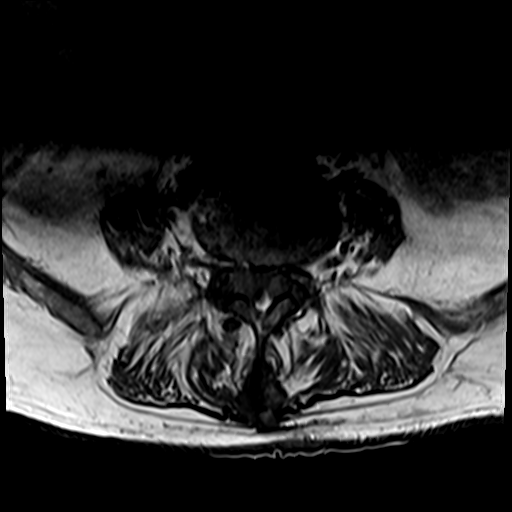
[im 18/42]
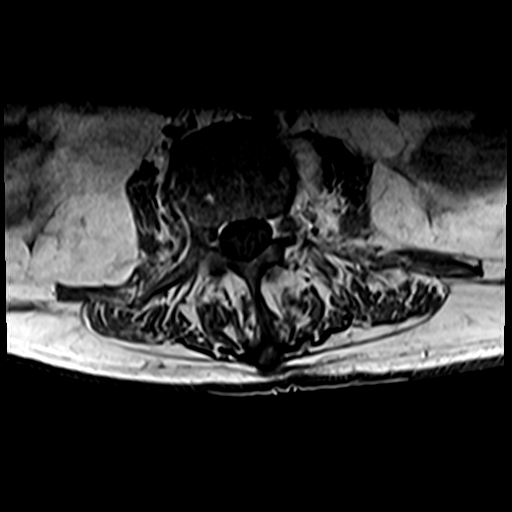
[im 21/42]
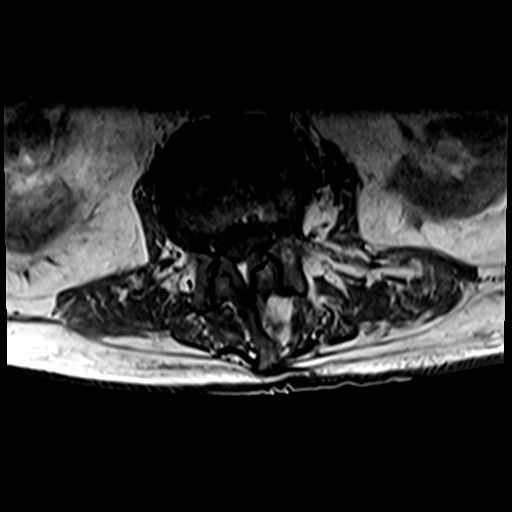
[im 24/42]
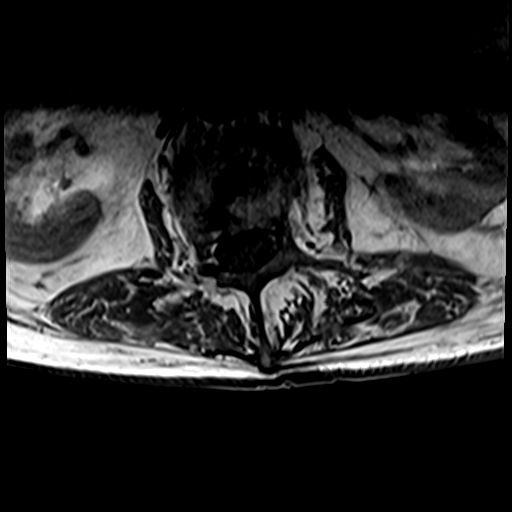
[im 30/42]
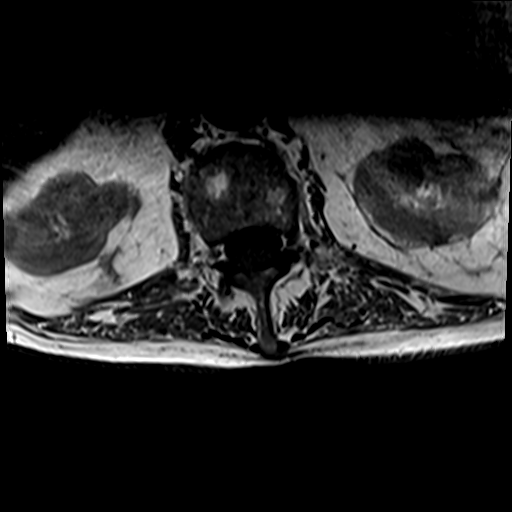
[im 36/42]
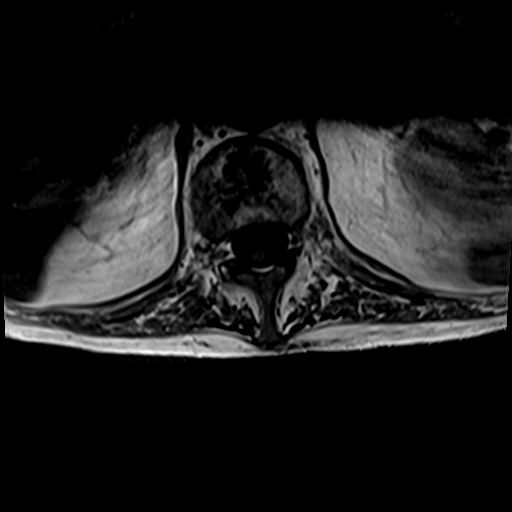
[im 42/42]
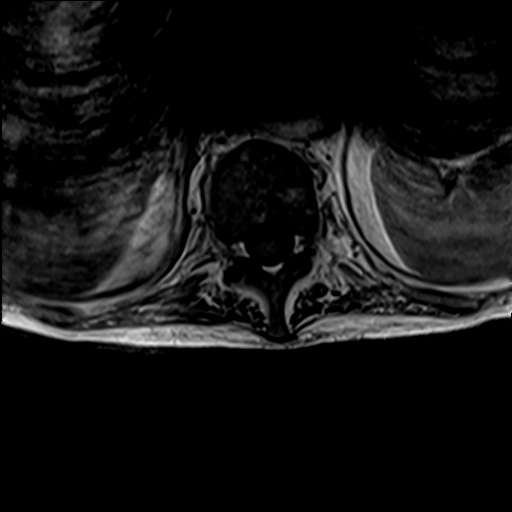

[30 of 48 positions shown; findings below may reference images not displayed]

FINDINGS: Segmentation: Numbering convention as previously established on
priors with the lowest fully formed disc space designated as L5-S1
with a rudimentary disc at S1-S2.

Alignment: Trace retrolisthesis L4 on L5. Slight retropulsion at L2.

Vertebrae: Status post cement augmentation of the T12 vertebral
body. There is bone marrow edema throughout the T12 vertebral body
without progression of height loss compared to previous CT. 2 mm
retropulsion of the T12 superior endplate.

Chronic L2 compression fracture status post cement augmentation
without residual marrow edema. Remaining vertebral body heights are
maintained. No evidence of discitis. Scattered T1/T2 hyperintense
intraosseous hemangiomas. Diffuse marrow heterogeneity without
discrete marrow replacing lesion. Findings are nonspecific but can
be seen in the setting of chronic anemia, smoking, and/or obesity.

Conus medullaris and cauda equina: Conus extends to the T12-L1
level. Conus and cauda equina appear normal.

Paraspinal and other soft tissues: Mild right psoas intramuscular
edema. Bilateral renal cysts.

Disc levels:

T11-T12: Slight retropulsion of the T12 superior endplate. No
foraminal or canal stenosis.

T12-L1: No significant disc protrusion, foraminal stenosis, or canal
stenosis.

L1-L2: Minimal broad-based disc bulge and mild L2 superior endplate
retropulsion. No foraminal or canal stenosis.

L2-L3: Mild L2 inferior endplate retropulsion. Mild bilateral facet
hypertrophy. There is mild canal stenosis with left greater than
right subarticular recess stenosis. Mild-to-moderate left foraminal
stenosis. Unchanged.

L3-L4: Mild disc osteophyte complex with bilateral facet arthropathy
and ligamentum flavum buckling. Findings contribute to moderate
canal stenosis with mild-to-moderate bilateral foraminal stenosis,
right slightly greater than left. Unchanged.

L4-L5: Posterior disc osteophyte complex with bilateral facet
arthropathy and prominent ligamentum flavum buckling. Findings
result in severe canal stenosis with moderate to severe right and
mild left foraminal stenosis. Unchanged.

L5-S1: Disc osteophyte complex with bilateral facet arthropathy and
prominent ligamentum flavum buckling. Findings result in
mild-to-moderate canal stenosis with moderate right foraminal
stenosis. Unchanged.
IMPRESSION: 1. Status post cement augmentation of the T12 vertebral body. There
is bone marrow edema throughout the T12 vertebral body without
progression of height loss compared to previous CT. This may
represent an acute on chronic fracture.
2. Multilevel degenerative changes of the lumbar spine with varying
degrees of foraminal and canal stenosis, as described above, not
significantly changed from previous CT.
3. At L4-5, there is severe canal stenosis with moderate to severe
right and mild left foraminal stenosis.
4. Mild right psoas intramuscular edema, which may be related to
muscle strain.

## 2021-05-05 IMAGING — CR DG CHEST 2V
1 series · 2 of 2 positions shown · non-contrast
Comparison: 05/02/2020 and older studies.  CT a chest, 03/28/2020.

CLINICAL DATA: Pt comes into the ED via [facility]. [facility] was
transporting the patient to his outpatient MRI when they noticed
that his O2 was at 83% RA w/ an [id number] at 19-20. Pt was belly
breathing with EMS. Pt denies any SHOB. Pt was having MRi completed
for known back pain. Pt currently able to answer all questions. Pt
does have CHF. Pt denies any CP, dizziness, or known swelling to
extremitieAFIB

EXAM:
CHEST - 2 VIEW

[Series 1: dg chest 2 view · 0.14mm/px · 2 of 2 slices shown]
[im 1/2]
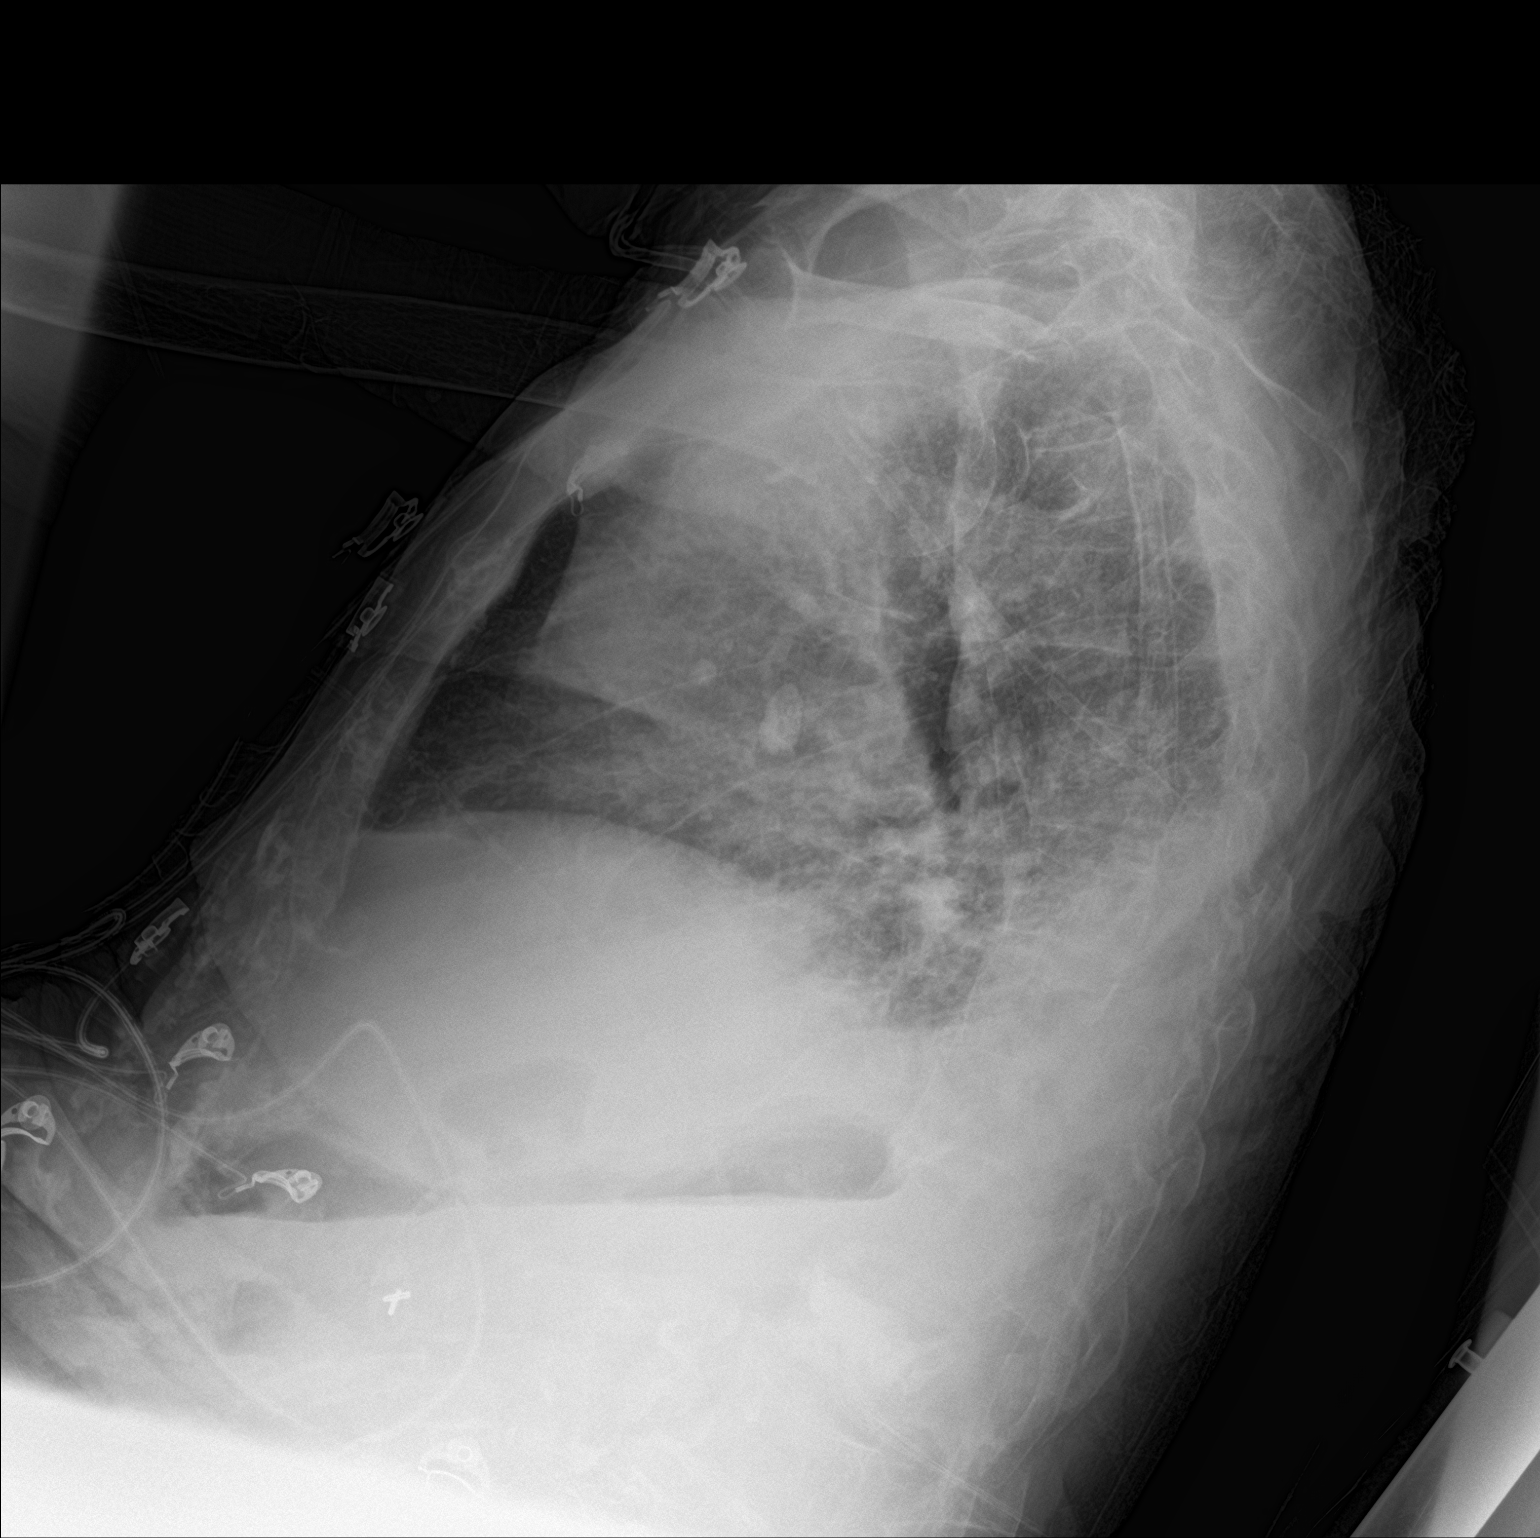
[im 2/2]
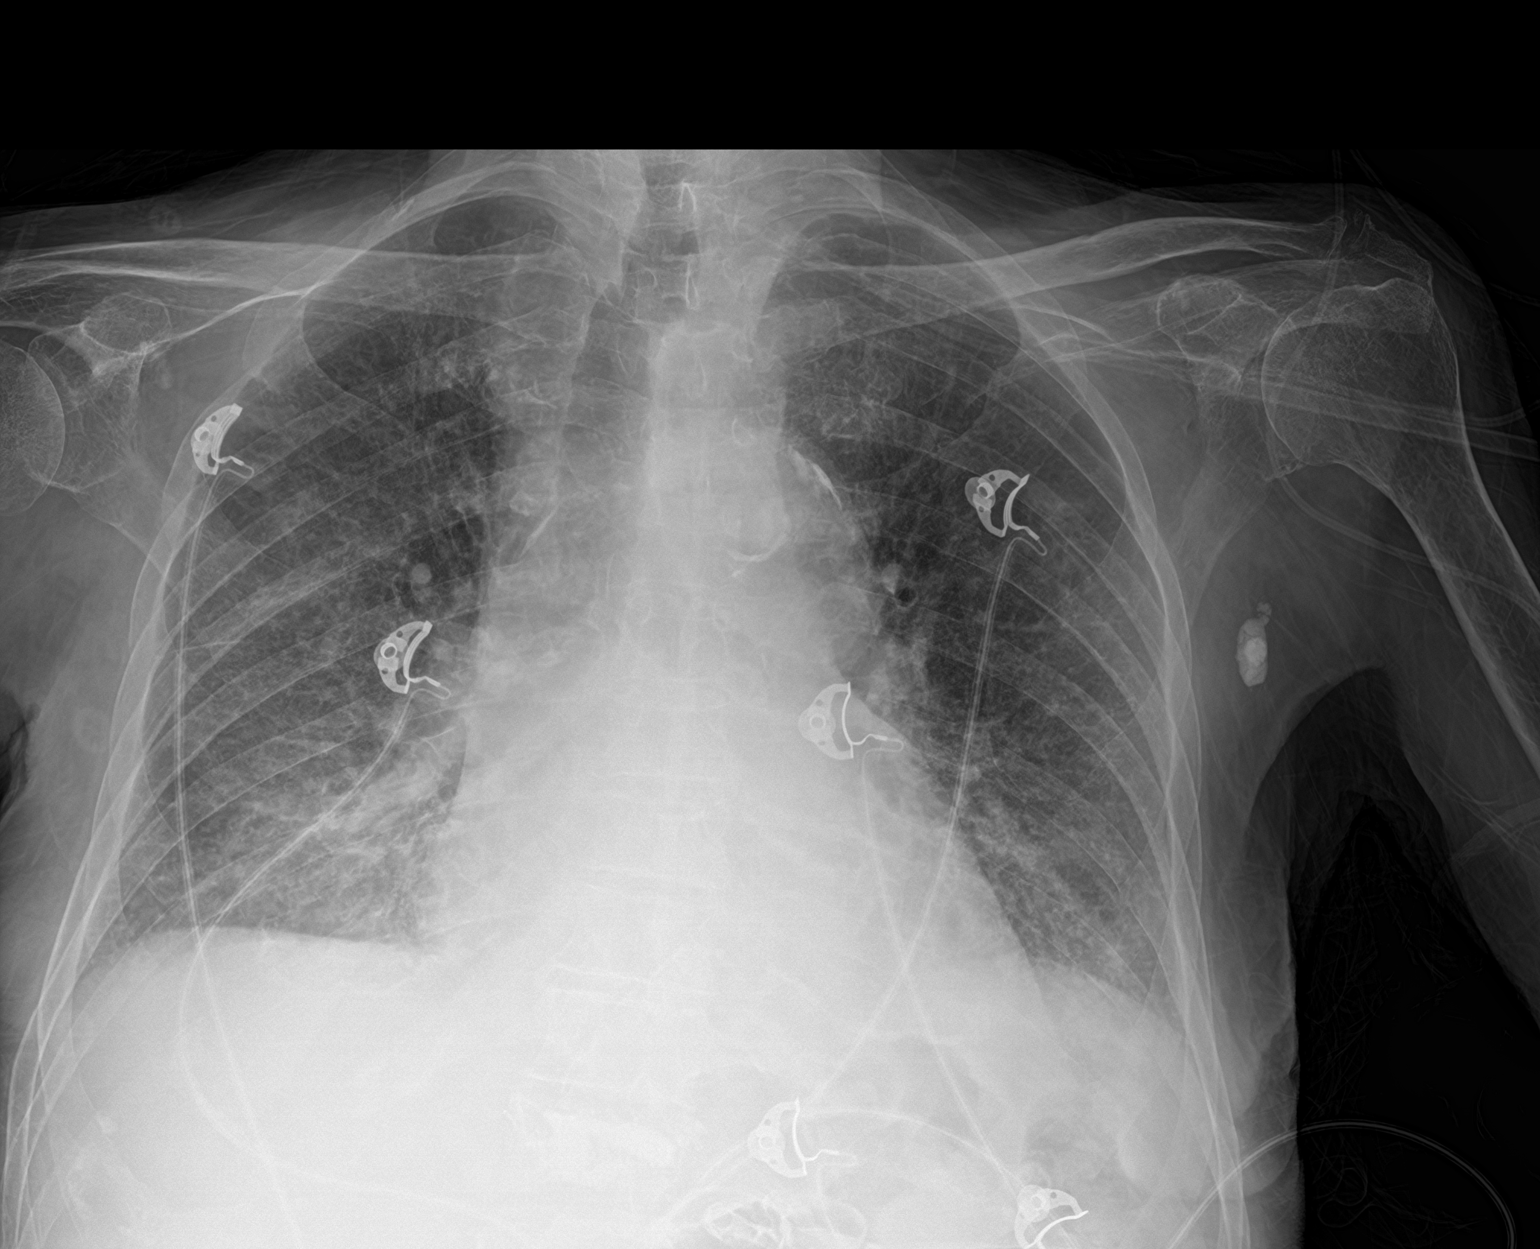

[2 of 2 positions shown; findings below may reference images not displayed]

FINDINGS: Cardiac silhouette is normal in size. No mediastinal or hilar
masses. No convincing lymphadenopathy. Aortic atherosclerosis.

Diffusely thickened interstitial markings most prominent in the
lower lungs. Ill-defined hazy opacities noted in the right mid and
both lower lungs. Small bilateral pleural effusions. Additional
opacity in the dependent lower lobes is consistent with atelectasis.

No pneumothorax.

Skeletal structures are demineralized but grossly intact.
IMPRESSION: 1. Suspect congestive heart failure with pulmonary edema, which is
similar to the appearance on the chest radiograph from 05/02/2020.

## 2021-05-16 DEATH — deceased
